# Patient Record
Sex: Male | Born: 1955
Health system: Southern US, Community
[De-identification: ages and names within clinical notes are randomized; demographics above are authoritative.]

## PROBLEM LIST (undated history)

## (undated) ENCOUNTER — Emergency Department (HOSPITAL_COMMUNITY)

## (undated) DIAGNOSIS — I428 Other cardiomyopathies: Secondary | ICD-10-CM

## (undated) DIAGNOSIS — Z7901 Long term (current) use of anticoagulants: Secondary | ICD-10-CM

## (undated) DIAGNOSIS — F1290 Cannabis use, unspecified, uncomplicated: Secondary | ICD-10-CM

## (undated) DIAGNOSIS — Z720 Tobacco use: Secondary | ICD-10-CM

## (undated) DIAGNOSIS — E785 Hyperlipidemia, unspecified: Secondary | ICD-10-CM

## (undated) DIAGNOSIS — I10 Essential (primary) hypertension: Secondary | ICD-10-CM

## (undated) DIAGNOSIS — Z79899 Other long term (current) drug therapy: Secondary | ICD-10-CM

## (undated) DIAGNOSIS — K7689 Other specified diseases of liver: Secondary | ICD-10-CM

## (undated) DIAGNOSIS — K089 Disorder of teeth and supporting structures, unspecified: Secondary | ICD-10-CM

## (undated) DIAGNOSIS — E042 Nontoxic multinodular goiter: Secondary | ICD-10-CM

## (undated) DIAGNOSIS — I700 Atherosclerosis of aorta: Secondary | ICD-10-CM

## (undated) DIAGNOSIS — I739 Peripheral vascular disease, unspecified: Secondary | ICD-10-CM

## (undated) DIAGNOSIS — I5020 Unspecified systolic (congestive) heart failure: Secondary | ICD-10-CM

## (undated) DIAGNOSIS — N400 Enlarged prostate without lower urinary tract symptoms: Secondary | ICD-10-CM

## (undated) DIAGNOSIS — I4891 Unspecified atrial fibrillation: Secondary | ICD-10-CM

## (undated) DIAGNOSIS — E079 Disorder of thyroid, unspecified: Secondary | ICD-10-CM

## (undated) DIAGNOSIS — I509 Heart failure, unspecified: Secondary | ICD-10-CM

## (undated) DIAGNOSIS — R011 Cardiac murmur, unspecified: Secondary | ICD-10-CM

## (undated) DIAGNOSIS — D1803 Hemangioma of intra-abdominal structures: Secondary | ICD-10-CM

## (undated) DIAGNOSIS — D125 Benign neoplasm of sigmoid colon: Secondary | ICD-10-CM

## (undated) DIAGNOSIS — D7589 Other specified diseases of blood and blood-forming organs: Secondary | ICD-10-CM

## (undated) DIAGNOSIS — E039 Hypothyroidism, unspecified: Secondary | ICD-10-CM

## (undated) DIAGNOSIS — M1990 Unspecified osteoarthritis, unspecified site: Secondary | ICD-10-CM

## (undated) DIAGNOSIS — R972 Elevated prostate specific antigen [PSA]: Secondary | ICD-10-CM

## (undated) DIAGNOSIS — Z91148 Patient's other noncompliance with medication regimen for other reason: Secondary | ICD-10-CM

## (undated) HISTORY — DX: Other specified diseases of blood and blood-forming organs: D75.89

## (undated) HISTORY — DX: Hyperlipidemia, unspecified: E78.5

## (undated) HISTORY — DX: Tobacco use: Z72.0

---

## 2013-11-12 ENCOUNTER — Ambulatory Visit: Payer: Self-pay | Admitting: Family Medicine

## 2014-07-26 DIAGNOSIS — D7589 Other specified diseases of blood and blood-forming organs: Secondary | ICD-10-CM | POA: Diagnosis not present

## 2014-07-26 DIAGNOSIS — R079 Chest pain, unspecified: Secondary | ICD-10-CM | POA: Diagnosis not present

## 2014-07-26 DIAGNOSIS — E785 Hyperlipidemia, unspecified: Secondary | ICD-10-CM | POA: Diagnosis not present

## 2014-07-26 DIAGNOSIS — F17290 Nicotine dependence, other tobacco product, uncomplicated: Secondary | ICD-10-CM | POA: Diagnosis not present

## 2014-08-07 ENCOUNTER — Ambulatory Visit: Payer: Self-pay | Admitting: Cardiology

## 2014-08-29 ENCOUNTER — Ambulatory Visit (INDEPENDENT_AMBULATORY_CARE_PROVIDER_SITE_OTHER): Payer: Commercial Managed Care - HMO | Admitting: Cardiovascular Disease

## 2014-08-29 ENCOUNTER — Encounter: Payer: Self-pay | Admitting: Cardiovascular Disease

## 2014-08-29 VITALS — BP 110/78 | HR 69 | Ht 73.0 in | Wt 167.0 lb

## 2014-08-29 DIAGNOSIS — F17200 Nicotine dependence, unspecified, uncomplicated: Secondary | ICD-10-CM

## 2014-08-29 DIAGNOSIS — Z720 Tobacco use: Secondary | ICD-10-CM

## 2014-08-29 DIAGNOSIS — K089 Disorder of teeth and supporting structures, unspecified: Secondary | ICD-10-CM

## 2014-08-29 DIAGNOSIS — E785 Hyperlipidemia, unspecified: Secondary | ICD-10-CM | POA: Insufficient documentation

## 2014-08-29 DIAGNOSIS — R079 Chest pain, unspecified: Secondary | ICD-10-CM

## 2014-08-29 NOTE — Assessment & Plan Note (Signed)
Atypical chest pain. He does have a good exercise tolerance with no symptoms He is concerned about this discomfort that comes and goes. We will order a routine treadmill study at his convenience

## 2014-08-29 NOTE — Progress Notes (Signed)
   Patient ID: Tyler Cohen, male    DOB: 1955/12/13, 59 y.o.   MRN: [id number]  HPI Comments: Tyler Cohen is a pleasant 59 year old gentleman with long smoking history for 40 years who started at age 19, who presents by referral for chest pain.  He reports that he is very active, uses his bike 3 days per week, goes a proximally 5-10 miles from [place] to [place]. He does not have a car to drive. He's been doing this for approximately one year and typically has no chest pain with exertion.  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 Denies having any chest pain when he mows.   He reports a vague sense of chest discomfort now and then, not associated with exertion, more often when he has stress Symptoms have been going on for 2-3 months. Not getting worse  Sometimes on the right, sometimes middle, typically diffuse in his chest . Not very intense .  EKG on today's visit shows normal sinus rhythm with rate 69 bpm, nonspecific ST abnormality consistent with early repolarization    No Known Allergies  No current outpatient prescriptions on file prior to visit.   No current facility-administered medications on file prior to visit.    Past Medical History  Diagnosis Date  . Hyperlipidemia   . Tobacco abuse     History reviewed. No pertinent past surgical history.  Social History  reports that he has been smoking Cigarettes.  He has a 7.5 pack-year smoking history. He does not have any smokeless tobacco history on file. He reports that he does not drink alcohol or use illicit drugs.  Family History family history includes Hypertension in his brother, father, and mother. Father with sickle cell trait, emphysema, smoker Mother with kidney disease, lost both legs, diabetic on dialysis Both parents died in their early 56s  Review of Systems  Constitutional: Negative.   Respiratory: Negative.   Cardiovascular: Positive for chest pain.   Gastrointestinal: Negative.   Musculoskeletal: Negative.   Skin: Negative.   Neurological: Negative.   Hematological: Negative.   Psychiatric/Behavioral: Negative.   All other systems reviewed and are negative.   BP 110/78 mmHg  Pulse 69  Ht 6\' 1"  (1.854 m)  Wt 167 lb (75.751 kg)  BMI 22.04 kg/m2  Physical Exam  Constitutional: He is oriented to person, place, and time. He appears well-developed and well-nourished.  HENT:  Head: Normocephalic.  Nose: Nose normal.  Mouth/Throat: Oropharynx is clear and moist.  Eyes: Conjunctivae are normal. Pupils are equal, round, and reactive to light.  Neck: Normal range of motion. Neck supple. No JVD present.  Cardiovascular: Normal rate, regular rhythm, normal heart sounds and intact distal pulses.  Exam reveals no gallop and no friction rub.   No murmur heard. Pulmonary/Chest: Effort normal and breath sounds normal. No respiratory distress. He has no wheezes. He has no rales. He exhibits no tenderness.  Abdominal: Soft. Bowel sounds are normal. He exhibits no distension. There is no tenderness.  Musculoskeletal: Normal range of motion. He exhibits no edema or tenderness.  Lymphadenopathy:    He has no cervical adenopathy.  Neurological: He is alert and oriented to person, place, and time. Coordination normal.  Skin: Skin is warm and dry. No rash noted. No erythema.  Psychiatric: He has a normal mood and affect. His behavior is normal. Judgment and thought content normal.

## 2014-08-29 NOTE — Assessment & Plan Note (Signed)
His dental disease does place him at higher risk of coronary artery disease.

## 2014-08-29 NOTE — Patient Instructions (Addendum)
You are doing well. No medication changes were made.  We will schedule a treadmill stress test for chest pain  Please call us if you have new issues that need to be addressed before your next appt.    Exercise Stress Electrocardiogram An exercise stress electrocardiogram is a test that is done to evaluate the blood supply to your heart. This test may also be called exercise stress electrocardiography. The test is done while you are walking on a treadmill. The goal of this test is to raise your heart rate. This test is done to find areas of poor blood flow to the heart by determining the extent of coronary artery disease (CAD).   CAD is defined as narrowing in one or more heart (coronary) arteries of more than 70%. If you have an abnormal test result, this may mean that you are not getting adequate blood flow to your heart during exercise. Additional testing may be needed to understand why your test was abnormal. LET [facility] CARE PROVIDER KNOW ABOUT:   Any allergies you have.  All medicines you are taking, including vitamins, herbs, eye drops, creams, and over-the-counter medicines.  Previous problems you or members of your family have had with the use of anesthetics.  Any blood disorders you have.  Previous surgeries you have had.  Medical conditions you have.  Possibility of pregnancy, if this applies. RISKS AND COMPLICATIONS Generally, this is a safe procedure. However, as with any procedure, complications can occur. Possible complications can include:  Pain or pressure in the following areas:  Chest.  Jaw or neck.  Between your shoulder blades.  Radiating down your left arm.  Dizziness or light-headedness.  Shortness of breath.  Increased or irregular heartbeats.  Nausea or vomiting.  Heart attack (rare). BEFORE THE PROCEDURE  Avoid all forms of caffeine 24 hours before your test or as directed by your health care provider. This includes coffee, tea (even  decaffeinated tea), caffeinated sodas, chocolate, cocoa, and certain pain medicines.  Follow your health care provider's instructions regarding eating and drinking before the test.  Take your medicines as directed at regular times with water unless instructed otherwise. Exceptions may include:  If you have diabetes, ask how you are to take your insulin or pills. It is common to adjust insulin dosing the morning of the test.  If you are taking beta-blocker medicines, it is important to talk to your health care provider about these medicines well before the date of your test. Taking beta-blocker medicines may interfere with the test. In some cases, these medicines need to be changed or stopped 24 hours or more before the test.  If you wear a nitroglycerin patch, it may need to be removed prior to the test. Ask your health care provider if the patch should be removed before the test.  If you use an inhaler for any breathing condition, bring it with you to the test.  If you are an outpatient, bring a snack so you can eat right after the stress phase of the test.  Do not smoke for 4 hours prior to the test or as directed by your health care provider.  Do not apply lotions, powders, creams, or oils on your chest prior to the test.  Wear loose-fitting clothes and comfortable shoes for the test. This test involves walking on a treadmill. PROCEDURE  Multiple patches (electrodes) will be put on your chest. If needed, small areas of your chest may have to be shaved to get better  contact with the electrodes. Once the electrodes are attached to your body, multiple wires will be attached to the electrodes and your heart rate will be monitored.  Your heart will be monitored both at rest and while exercising.  You will walk on a treadmill. The treadmill will be started at a slow pace. The treadmill speed and incline will gradually be increased to raise your heart rate. AFTER THE PROCEDURE  Your heart  rate and blood pressure will be monitored after the test.  You may return to your normal schedule including diet, activities, and medicines, unless your health care provider tells you otherwise. Document Released: 03/19/2000 Document Revised: 03/27/2013 Document Reviewed: 11/27/2012 [facility] Patient Information 2015 [place], [place]. This information is not intended to replace advice given to you by your health care provider. Make sure you discuss any questions you have with your health care provider.

## 2014-08-29 NOTE — Assessment & Plan Note (Signed)
We have encouraged him to continue to work on weaning his cigarettes and smoking cessation. He will continue to work on this and does not want any assistance with chantix.  

## 2014-08-29 NOTE — Assessment & Plan Note (Signed)
Recommended he stay on his Lipitor

## 2014-09-19 ENCOUNTER — Ambulatory Visit (INDEPENDENT_AMBULATORY_CARE_PROVIDER_SITE_OTHER): Payer: Commercial Managed Care - HMO | Admitting: Cardiovascular Disease

## 2014-09-19 DIAGNOSIS — R079 Chest pain, unspecified: Secondary | ICD-10-CM

## 2014-09-19 LAB — EXERCISE TOLERANCE TEST
CSEPHR: 84 %
CSEPPHR: 137 {beats}/min
Estimated workload: 10.1 METS
Exercise duration (min): 9 min
Exercise duration (sec): 0 s
Rest HR: 67 {beats}/min

## 2015-02-18 ENCOUNTER — Other Ambulatory Visit: Payer: Self-pay | Admitting: Family Medicine

## 2015-04-06 DIAGNOSIS — L409 Psoriasis, unspecified: Secondary | ICD-10-CM

## 2015-04-06 HISTORY — DX: Psoriasis, unspecified: L40.9

## 2015-07-08 ENCOUNTER — Other Ambulatory Visit: Payer: Self-pay | Admitting: Family Medicine

## 2015-07-08 ENCOUNTER — Encounter: Payer: Self-pay | Admitting: Family Medicine

## 2015-07-08 NOTE — Telephone Encounter (Signed)
apt 

## 2015-07-14 NOTE — Telephone Encounter (Signed)
Letter sent.

## 2015-08-11 DIAGNOSIS — D7589 Other specified diseases of blood and blood-forming organs: Secondary | ICD-10-CM | POA: Insufficient documentation

## 2015-08-11 DIAGNOSIS — Z720 Tobacco use: Secondary | ICD-10-CM | POA: Insufficient documentation

## 2015-08-20 ENCOUNTER — Ambulatory Visit: Payer: Self-pay | Admitting: Unknown Physician Specialty

## 2015-09-03 ENCOUNTER — Encounter: Payer: Self-pay | Admitting: Unknown Physician Specialty

## 2015-09-03 ENCOUNTER — Ambulatory Visit (INDEPENDENT_AMBULATORY_CARE_PROVIDER_SITE_OTHER): Payer: Commercial Managed Care - HMO | Admitting: Unknown Physician Specialty

## 2015-09-03 VITALS — BP 109/73 | HR 71 | Temp 97.3°F | Ht 71.7 in | Wt 166.2 lb

## 2015-09-03 DIAGNOSIS — Z720 Tobacco use: Secondary | ICD-10-CM | POA: Diagnosis not present

## 2015-09-03 DIAGNOSIS — Z0000 Encounter for general adult medical examination without abnormal findings: Secondary | ICD-10-CM

## 2015-09-03 DIAGNOSIS — L409 Psoriasis, unspecified: Secondary | ICD-10-CM

## 2015-09-03 DIAGNOSIS — F17200 Nicotine dependence, unspecified, uncomplicated: Secondary | ICD-10-CM

## 2015-09-03 DIAGNOSIS — E785 Hyperlipidemia, unspecified: Secondary | ICD-10-CM | POA: Diagnosis not present

## 2015-09-03 MED ORDER — CLOTRIMAZOLE-BETAMETHASONE 1-0.05 % EX CREA
1.0000 | TOPICAL_CREAM | Freq: Two times a day (BID) | CUTANEOUS | Status: DC
Start: 2015-09-03 — End: 2016-09-09

## 2015-09-03 NOTE — Assessment & Plan Note (Signed)
Rx for Lotrisone 

## 2015-09-03 NOTE — Progress Notes (Signed)
   BP 109/73 mmHg  Pulse 71  Temp(Src) 97.3 F (36.3 C)  Ht 5' 11.7" (1.821 m)  Wt 166 lb 3.2 oz (75.388 kg)  BMI 22.73 kg/m2  SpO2 97%   Subjective:    Patient ID: Tyler Cohen, male    DOB: 1956/02/24, 60 y.o.   MRN: [id number]  HPI: Tyler Cohen is a 60 y.o. male  Chief Complaint  Patient presents with  . Hyperlipidemia  . Labs Only    HIV and Hep C order entered  . Rash    pt states he has a rash on his left arm and neck that came up about a month ago. States psoriasis runs in his family.     Hyperlipidemia Using medications without problems No Muscle aches  Diet compliance: Watches what he eats.   Exercise: rides a bicycle  Rash Rash on left arm and behind his neck for 30 days.  States rash itches when it gets hot.  No fever.  No problems with bowel or bladder  Tobacco Cutting back and trying to quit.    Relevant past medical, surgical, family and social history reviewed and updated as indicated. Interim medical history since our last visit reviewed. Allergies and medications reviewed and updated.  Review of Systems  Per HPI unless specifically indicated above     Objective:    BP 109/73 mmHg  Pulse 71  Temp(Src) 97.3 F (36.3 C)  Ht 5' 11.7" (1.821 m)  Wt 166 lb 3.2 oz (75.388 kg)  BMI 22.73 kg/m2  SpO2 97%  Wt Readings from Last 3 Encounters:  09/03/15 166 lb 3.2 oz (75.388 kg)  07/26/14 173 lb (78.472 kg)  08/29/14 167 lb (75.751 kg)    Physical Exam  Constitutional: He is oriented to person, place, and time. He appears well-developed and well-nourished. No distress.  HENT:  Head: Normocephalic and atraumatic.  Eyes: Conjunctivae and lids are normal. Right eye exhibits no discharge. Left eye exhibits no discharge. No scleral icterus.  Neck: Normal range of motion. Neck supple. No JVD present. Carotid bruit is not present.  Cardiovascular: Normal rate, regular rhythm and normal heart sounds.   Pulmonary/Chest: Effort normal and breath  sounds normal. No respiratory distress.  Abdominal: Normal appearance. There is no splenomegaly or hepatomegaly.  Musculoskeletal: Normal range of motion.  Neurological: He is alert and oriented to person, place, and time.  Skin: Skin is warm, dry and intact. Rash noted. No pallor.  Papular rash left arm and behind neck  Psychiatric: He has a normal mood and affect. His behavior is normal. Judgment and thought content normal.      Assessment & Plan:   Problem List Items Addressed This Visit      Unprioritized   Hyperlipidemia    LDL is 89.  Continue present meds.        Relevant Orders   Lipid Panel w/o Chol/HDL Ratio   Comprehensive metabolic panel   Psoriasis    Rx for Lotrisone      Smoker    Encouraged to quit       Other Visit Diagnoses    Health care maintenance    -  Primary    Relevant Orders    Hepatitis C antibody    HIV antibody        Follow up plan: Return for physical.

## 2015-09-03 NOTE — Assessment & Plan Note (Signed)
Encouraged to quit. 

## 2015-09-03 NOTE — Assessment & Plan Note (Addendum)
LDL is 89.  Continue present meds.

## 2015-09-04 LAB — COMPREHENSIVE METABOLIC PANEL
A/G RATIO: 1.5 (ref 1.2–2.2)
ALT: 17 IU/L (ref 0–44)
AST: 19 IU/L (ref 0–40)
Albumin: 4 g/dL (ref 3.5–5.5)
Alkaline Phosphatase: 62 IU/L (ref 39–117)
BILIRUBIN TOTAL: 0.4 mg/dL (ref 0.0–1.2)
BUN/Creatinine Ratio: 15 (ref 9–20)
BUN: 15 mg/dL (ref 6–24)
CHLORIDE: 102 mmol/L (ref 96–106)
CO2: 23 mmol/L (ref 18–29)
Calcium: 9.4 mg/dL (ref 8.7–10.2)
Creatinine, Ser: 0.99 mg/dL (ref 0.76–1.27)
GFR, EST AFRICAN AMERICAN: 96 mL/min/{1.73_m2} (ref >59)
GFR, EST NON AFRICAN AMERICAN: 83 mL/min/{1.73_m2} (ref >59)
GLOBULIN, TOTAL: 2.7 g/dL (ref 1.5–4.5)
GLUCOSE: 83 mg/dL (ref 65–99)
POTASSIUM: 4.5 mmol/L (ref 3.5–5.2)
SODIUM: 138 mmol/L (ref 134–144)
TOTAL PROTEIN: 6.7 g/dL (ref 6.0–8.5)

## 2015-09-04 LAB — LIPID PANEL W/O CHOL/HDL RATIO
Cholesterol, Total: 159 mg/dL (ref 100–199)
HDL: 54 mg/dL (ref >39)
LDL CALC: 95 mg/dL (ref 0–99)
Triglycerides: 51 mg/dL (ref 0–149)
VLDL Cholesterol Cal: 10 mg/dL (ref 5–40)

## 2015-09-04 LAB — HEPATITIS C ANTIBODY

## 2015-09-04 LAB — HIV ANTIBODY (ROUTINE TESTING W REFLEX): HIV Screen 4th Generation wRfx: NONREACTIVE

## 2015-10-15 ENCOUNTER — Encounter: Payer: Self-pay | Admitting: Unknown Physician Specialty

## 2015-10-15 ENCOUNTER — Ambulatory Visit (INDEPENDENT_AMBULATORY_CARE_PROVIDER_SITE_OTHER): Payer: Commercial Managed Care - HMO | Admitting: Unknown Physician Specialty

## 2015-10-15 VITALS — BP 104/70 | HR 69 | Temp 98.0°F | Ht 72.6 in | Wt 163.2 lb

## 2015-10-15 DIAGNOSIS — Z0000 Encounter for general adult medical examination without abnormal findings: Secondary | ICD-10-CM | POA: Diagnosis not present

## 2015-10-15 DIAGNOSIS — Z125 Encounter for screening for malignant neoplasm of prostate: Secondary | ICD-10-CM | POA: Diagnosis not present
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
## 2015-10-24 ENCOUNTER — Other Ambulatory Visit: Payer: Self-pay

## 2015-10-24 ENCOUNTER — Telehealth: Payer: Self-pay

## 2015-10-24 NOTE — Telephone Encounter (Signed)
Gastroenterology Pre-Procedure Review  Request Date: 12/01/2015  Requesting Physician: Dr. Julian Hy  PATIENT REVIEW QUESTIONS: The patient responded to the following health history questions as indicated:    1. Are you having any GI issues? no 2. Do you have a personal history of Polyps? no 3. Do you have a family history of Colon Cancer or Polyps? no 4. Diabetes Mellitus? no 5. Joint replacements in the past 12 months?no 6. Major health problems in the past 3 months?no 7. Any artificial heart valves, MVP, or defibrillator?no    MEDICATIONS & ALLERGIES:    Patient reports the following regarding taking any anticoagulation/antiplatelet therapy:   Plavix, Coumadin, Eliquis, Xarelto, Lovenox, Pradaxa, Brilinta, or Effient? no Aspirin? yes (heart health )  Patient confirms/reports the following medications:  Current Outpatient Prescriptions  Medication Sig Dispense Refill  . aspirin 81 MG tablet Take 81 mg by mouth daily.    Marland Kitchen atorvastatin (LIPITOR) 20 MG tablet TAKE 1 TABLET BY MOUTH AT BEDTIME 30 tablet 1  . b complex vitamins tablet Take 1 tablet by mouth daily.    . clotrimazole-betamethasone (LOTRISONE) cream Apply 1 application topically 2 (two) times daily. 30 g 1   No current facility-administered medications for this visit.    Patient confirms/reports the following allergies:  No Known Allergies  No orders of the defined types were placed in this encounter.    AUTHORIZATION INFORMATION Primary Insurance: 1D#: Group #:  Secondary Insurance: 1D#: Group #:  SCHEDULE INFORMATION: Date: 12/01/2015  Time: Location: [facility]

## 2015-10-27 ENCOUNTER — Other Ambulatory Visit: Payer: Self-pay | Admitting: Family Medicine

## 2015-11-03 NOTE — Telephone Encounter (Signed)
Per [employer], cpt code [phone] does not require Pre-Authorization. If you have any questions, please reply or contact our call center at [phone].

## 2015-11-28 NOTE — Discharge Instructions (Signed)

## 2015-12-01 ENCOUNTER — Ambulatory Visit: Payer: Commercial Managed Care - HMO | Admitting: Anesthesiology

## 2015-12-01 ENCOUNTER — Encounter
Admission: RE | Disposition: A | Discharge status: Home / Self Care | Payer: Self-pay | Source: Ambulatory Visit | Attending: Gastroenterology

## 2015-12-01 ENCOUNTER — Ambulatory Visit
Admission: RE | Admit: 2015-12-01 | Discharge: 2015-12-01 | Disposition: A | Discharge status: Home / Self Care | Payer: Commercial Managed Care - HMO | Source: Ambulatory Visit | Attending: Gastroenterology | Admitting: Gastroenterology

## 2015-12-01 DIAGNOSIS — R011 Cardiac murmur, unspecified: Secondary | ICD-10-CM | POA: Insufficient documentation

## 2015-12-01 DIAGNOSIS — Z79899 Other long term (current) drug therapy: Secondary | ICD-10-CM | POA: Insufficient documentation

## 2015-12-01 DIAGNOSIS — Z825 Family history of asthma and other chronic lower respiratory diseases: Secondary | ICD-10-CM | POA: Diagnosis not present

## 2015-12-01 DIAGNOSIS — F17210 Nicotine dependence, cigarettes, uncomplicated: Secondary | ICD-10-CM | POA: Insufficient documentation

## 2015-12-01 DIAGNOSIS — D7589 Other specified diseases of blood and blood-forming organs: Secondary | ICD-10-CM | POA: Diagnosis not present

## 2015-12-01 DIAGNOSIS — Z841 Family history of disorders of kidney and ureter: Secondary | ICD-10-CM | POA: Insufficient documentation

## 2015-12-01 DIAGNOSIS — Z832 Family history of diseases of the blood and blood-forming organs and certain disorders involving the immune mechanism: Secondary | ICD-10-CM | POA: Diagnosis not present

## 2015-12-01 DIAGNOSIS — K635 Polyp of colon: Secondary | ICD-10-CM | POA: Diagnosis not present

## 2015-12-01 DIAGNOSIS — Z1211 Encounter for screening for malignant neoplasm of colon: Secondary | ICD-10-CM

## 2015-12-01 DIAGNOSIS — K641 Second degree hemorrhoids: Secondary | ICD-10-CM | POA: Insufficient documentation

## 2015-12-01 DIAGNOSIS — E785 Hyperlipidemia, unspecified: Secondary | ICD-10-CM | POA: Diagnosis not present

## 2015-12-01 DIAGNOSIS — D125 Benign neoplasm of sigmoid colon: Secondary | ICD-10-CM

## 2015-12-01 DIAGNOSIS — Z833 Family history of diabetes mellitus: Secondary | ICD-10-CM | POA: Diagnosis not present

## 2015-12-01 DIAGNOSIS — E079 Disorder of thyroid, unspecified: Secondary | ICD-10-CM | POA: Insufficient documentation

## 2015-12-01 DIAGNOSIS — M1990 Unspecified osteoarthritis, unspecified site: Secondary | ICD-10-CM | POA: Insufficient documentation

## 2015-12-01 DIAGNOSIS — E7800 Pure hypercholesterolemia, unspecified: Secondary | ICD-10-CM | POA: Insufficient documentation

## 2015-12-01 DIAGNOSIS — Z7982 Long term (current) use of aspirin: Secondary | ICD-10-CM | POA: Insufficient documentation

## 2015-12-01 DIAGNOSIS — Z8249 Family history of ischemic heart disease and other diseases of the circulatory system: Secondary | ICD-10-CM | POA: Diagnosis not present

## 2015-12-01 HISTORY — PX: COLONOSCOPY WITH PROPOFOL: SHX5780

## 2015-12-01 HISTORY — DX: Unspecified osteoarthritis, unspecified site: M19.90

## 2015-12-01 HISTORY — PX: POLYPECTOMY: SHX5525

## 2015-12-01 HISTORY — DX: Disorder of thyroid, unspecified: E07.9

## 2015-12-01 HISTORY — DX: Cardiac murmur, unspecified: R01.1

## 2015-12-01 SURGERY — COLONOSCOPY WITH PROPOFOL
Anesthesia: Monitor Anesthesia Care | Wound class: Contaminated

## 2015-12-01 MED ORDER — LIDOCAINE HCL (CARDIAC) 20 MG/ML IV SOLN
INTRAVENOUS | Status: DC | PRN
  Administered 2015-12-01: 50 mg via INTRAVENOUS

## 2015-12-01 MED ORDER — PROPOFOL 10 MG/ML IV BOLUS
INTRAVENOUS | Status: DC | PRN
  Administered 2015-12-01: 30 mg via INTRAVENOUS
  Administered 2015-12-01: 10 mg via INTRAVENOUS
  Administered 2015-12-01 (×3): 20 mg via INTRAVENOUS
  Administered 2015-12-01: 30 mg via INTRAVENOUS
  Administered 2015-12-01: 70 mg via INTRAVENOUS

## 2015-12-01 MED ORDER — LACTATED RINGERS IV SOLN
INTRAVENOUS | Status: DC | PRN
  Administered 2015-12-01: 09:00:00 via INTRAVENOUS

## 2015-12-01 MED ORDER — STERILE WATER FOR IRRIGATION IR SOLN
Status: DC | PRN
  Administered 2015-12-01: 09:00:00

## 2015-12-01 SURGICAL SUPPLY — 23 items
CANISTER SUCT 1200ML W/VALVE (MISCELLANEOUS) ×3 IMPLANT
CLIP HMST 235XBRD CATH ROT (MISCELLANEOUS) IMPLANT
CLIP RESOLUTION 360 11X235 (MISCELLANEOUS)
FCP ESCP3.2XJMB 240X2.8X (MISCELLANEOUS)
FORCEPS BIOP RAD 4 LRG CAP 4 (CUTTING FORCEPS) IMPLANT
FORCEPS BIOP RJ4 240 W/NDL (MISCELLANEOUS)
FORCEPS ESCP3.2XJMB 240X2.8X (MISCELLANEOUS) IMPLANT
GOWN CVR UNV OPN BCK APRN NK (MISCELLANEOUS) ×4 IMPLANT
GOWN ISOL THUMB LOOP REG UNIV (MISCELLANEOUS) ×2
INJECTOR VARIJECT VIN23 (MISCELLANEOUS) IMPLANT
KIT DEFENDO VALVE AND CONN (KITS) IMPLANT
KIT ENDO PROCEDURE OLY (KITS) ×3 IMPLANT
MARKER SPOT ENDO TATTOO 5ML (MISCELLANEOUS) IMPLANT
PAD GROUND ADULT SPLIT (MISCELLANEOUS) IMPLANT
PROBE APC STR FIRE (PROBE) IMPLANT
RETRIEVER NET ROTH 2.5X230 LF (MISCELLANEOUS) ×3 IMPLANT
SNARE SHORT THROW 13M SML OVAL (MISCELLANEOUS) ×3 IMPLANT
SNARE SHORT THROW 30M LRG OVAL (MISCELLANEOUS) IMPLANT
SNARE SNG USE RND 15MM (INSTRUMENTS) IMPLANT
SPOT EX ENDOSCOPIC TATTOO (MISCELLANEOUS)
TRAP ETRAP POLY (MISCELLANEOUS) ×3 IMPLANT
VARIJECT INJECTOR VIN23 (MISCELLANEOUS)
WATER STERILE IRR 250ML POUR (IV SOLUTION) ×3 IMPLANT

## 2015-12-01 NOTE — Transfer of Care (Signed)
Immediate Anesthesia Transfer of Care Note  Patient: URIYAH ETUE  Procedure(s) Performed: Procedure(s): COLONOSCOPY WITH PROPOFOL (N/A) POLYPECTOMY  Patient Location: PACU  Anesthesia Type: MAC  Level of Consciousness: awake, alert  and patient cooperative  Airway and Oxygen Therapy: Patient Spontanous Breathing and Patient connected to supplemental oxygen  Post-op Assessment: Post-op Vital signs reviewed, Patient's Cardiovascular Status Stable, Respiratory Function Stable, Patent Airway and No signs of Nausea or vomiting  Post-op Vital Signs: Reviewed and stable  Complications: No apparent anesthesia complications

## 2015-12-01 NOTE — Op Note (Signed)
[facility] Gastroenterology Patient Name: Tyler Cohen Procedure Date: 12/01/2015 8:44 AM MRN: [id number] Account #: [id number] Date of Birth: 02-09-1956 Admit Type: Outpatient Age: 60 [facility]OM 01 Gender: Male Note Status: Finalized Procedure:            Colonoscopy Indications:          Screening for colorectal malignant neoplasm Providers:            Lucilla Lame MD, MD Referring MD:         Kathrine Haddock (Referring MD) Medicines:            Propofol per Anesthesia Complications:        No immediate complications. Procedure:            Pre-Anesthesia Assessment:                       - Prior to the procedure, a History and Physical was                        performed, and patient medications and allergies were                        reviewed. The patient's tolerance of previous                        anesthesia was also reviewed. The risks and benefits of                        the procedure and the sedation options and risks were                        discussed with the patient. All questions were                        answered, and informed consent was obtained. Prior                        Anticoagulants: The patient has taken no previous                        anticoagulant or antiplatelet agents. ASA Grade                        Assessment: II - A patient with mild systemic disease.                        After reviewing the risks and benefits, the patient was                        deemed in satisfactory condition to undergo the                        procedure.                       After obtaining informed consent, the colonoscope was                        passed under direct vision. Throughout the procedure,  the patient's blood pressure, pulse, and oxygen                        saturations were monitored continuously. The Olympus CF                        H180AL colonoscope (S#: [id number]) was introduced through                     the anus and advanced to the the cecum, identified by                        appendiceal orifice and ileocecal valve. The                        colonoscopy was performed without difficulty. The                        patient tolerated the procedure well. The quality of                        the bowel preparation was fair. Findings:      The perianal and digital rectal examinations were normal.      Four sessile polyps were found in the sigmoid colon. The polyps were 5       to 7 mm in size. These polyps were removed with a cold snare. Resection       and retrieval were complete.      Non-bleeding internal hemorrhoids were found during retroflexion. The       hemorrhoids were Grade II (internal hemorrhoids that prolapse but reduce       spontaneously). Impression:           - Preparation of the colon was fair.                       - Four 5 to 7 mm polyps in the sigmoid colon, removed                        with a cold snare. Resected and retrieved.                       - Non-bleeding internal hemorrhoids. Recommendation:       - Await pathology results.                       - Repeat colonoscopy in 5 years if polyp adenoma and 10                        years if hyperplastic Procedure Code(s):    --- Professional ---                       [phone], Colonoscopy, flexible; with removal of tumor(s),                        polyp(s), or other lesion(s) by snare technique Diagnosis Code(s):    --- Professional ---                       Z12.11, Encounter for screening for malignant neoplasm  of colon                       D12.5, Benign neoplasm of sigmoid colon CPT copyright 2016 [employer] Medical Association. All rights reserved. The codes documented in this report are preliminary and upon coder review may  be revised to meet current compliance requirements. Lucilla Lame MD, MD 12/01/2015 9:06:22 AM This report has been signed electronically. Number of  Addenda: 0 Note Initiated On: 12/01/2015 8:44 AM Scope Withdrawal Time: 0 hours 7 minutes 42 seconds  Total Procedure Duration: 0 hours 10 minutes 42 seconds       [facility]

## 2015-12-01 NOTE — Anesthesia Procedure Notes (Signed)
Procedure Name: MAC Performed by: Deaunte Dente Pre-anesthesia Checklist: Patient identified, Emergency Drugs available, Suction available, Timeout performed and Patient being monitored Patient Re-evaluated:Patient Re-evaluated prior to inductionOxygen Delivery Method: Nasal cannula Placement Confirmation: positive ETCO2       

## 2015-12-01 NOTE — Anesthesia Postprocedure Evaluation (Signed)
Anesthesia Post Note  Patient: Tyler Cohen  Procedure(s) Performed: Procedure(s) (LRB): COLONOSCOPY WITH PROPOFOL (N/A) POLYPECTOMY  Anesthesia Post Evaluation  Virl Axe,  Jarrell Armond D

## 2015-12-01 NOTE — Anesthesia Preprocedure Evaluation (Signed)
Anesthesia Evaluation  Patient identified by MRN, date of birth, ID band Patient awake    Reviewed: Allergy & Precautions, H&P , NPO status , Patient's Chart, lab work & pertinent test results  History of Anesthesia Complications Negative for: history of anesthetic complications  Airway Mallampati: II  TM Distance: >3 FB Neck ROM: full    Dental  (+) Edentulous Upper, Poor Dentition   Pulmonary Current Smoker,    Pulmonary exam normal        Cardiovascular Normal cardiovascular exam     Neuro/Psych negative neurological ROS     GI/Hepatic negative GI ROS, Neg liver ROS,   Endo/Other  Elevated cholesterol  Renal/GU   negative genitourinary   Musculoskeletal   Abdominal   Peds  Hematology negative hematology ROS (+)   Anesthesia Other Findings   Reproductive/Obstetrics                             Anesthesia Physical Anesthesia Plan  ASA: II  Anesthesia Plan: MAC   Post-op Pain Management:    Induction:   Airway Management Planned:   Additional Equipment:   Intra-op Plan:   Post-operative Plan:   Informed Consent: I have reviewed the patients History and Physical, chart, labs and discussed the procedure including the risks, benefits and alternatives for the proposed anesthesia with the patient or authorized representative who has indicated his/her understanding and acceptance.     Plan Discussed with:   Anesthesia Plan Comments:         Anesthesia Quick Evaluation

## 2015-12-01 NOTE — H&P (Signed)
  Lucilla Lame, MD [facility] [address] [place], [place] [address] Phone: [phone] Fax : [phone]  Primary Care Physician:  Kathrine Haddock, NP Primary Gastroenterologist:  Dr. Allen Norris  Pre-Procedure History & Physical: HPI:  Tyler Cohen is a 60 y.o. male is here for a screening colonoscopy.   Past Medical History:  Diagnosis Date  . Arthritis   . Heart murmur   . Hyperlipidemia   . Macrocytosis   . Thyroid disease   . Tobacco abuse     History reviewed. No pertinent surgical history.  Prior to Admission medications   Medication Sig Start Date End Date Taking? Authorizing Provider  aspirin 81 MG tablet Take 81 mg by mouth daily.   Yes Historical Provider, MD  atorvastatin (LIPITOR) 20 MG tablet TAKE 1 TABLET BY MOUTH AT BEDTIME 10/27/15  Yes Volney American, PA-C  b complex vitamins tablet Take 1 tablet by mouth daily.   Yes Historical Provider, MD  clotrimazole-betamethasone (LOTRISONE) cream Apply 1 application topically 2 (two) times daily. 09/03/15  Yes Kathrine Haddock, NP    Allergies as of 10/24/2015  . (No Known Allergies)    Family History  Problem Relation Age of Onset  . Hypertension Mother   . Diabetes Mother     lost both legs  . Kidney disease Mother   . Hypertension Father   . Emphysema Father   . Sickle cell trait Father   . Hypertension Brother   . Hyperlipidemia Brother   . Sickle cell trait Sister     Social History   Social History  . Marital status: Married    Spouse name: N/A  . Number of children: N/A  . Years of education: N/A   Occupational History  . Not on file.   Social History Main Topics  . Smoking status: Current Every Day Smoker    Packs/day: 0.00    Years: 42.00    Types: Cigarettes  . Smokeless tobacco: Never Used  . Alcohol use No  . Drug use:     Types: Marijuana     Comment: pt states he smokes every once in a while  . Sexual activity: Yes   Other Topics Concern  . Not on file   Social  History Narrative  . No narrative on file    Review of Systems: See HPI, otherwise negative ROS  Physical Exam: Ht 6\' 1"  (1.854 m)   Wt 165 lb (74.8 kg)   BMI 21.77 kg/m  General:   Alert,  pleasant and cooperative in NAD Head:  Normocephalic and atraumatic. Neck:  Supple; no masses or thyromegaly. Lungs:  Clear throughout to auscultation.    Heart:  Regular rate and rhythm. Abdomen:  Soft, nontender and nondistended. Normal bowel sounds, without guarding, and without rebound.   Neurologic:  Alert and  oriented x4;  grossly normal neurologically.  Impression/Plan: Tyler Cohen is now here to undergo a screening colonoscopy.  Risks, benefits, and alternatives regarding colonoscopy have been reviewed with the patient.  Questions have been answered.  All parties agreeable.

## 2015-12-02 ENCOUNTER — Encounter: Payer: Self-pay | Admitting: Gastroenterology

## 2015-12-03 ENCOUNTER — Encounter: Payer: Self-pay | Admitting: Gastroenterology

## 2015-12-04 ENCOUNTER — Encounter: Payer: Self-pay | Admitting: Gastroenterology

## 2015-12-22 ENCOUNTER — Other Ambulatory Visit: Payer: Self-pay | Admitting: Family Medicine

## 2016-02-17 ENCOUNTER — Ambulatory Visit (INDEPENDENT_AMBULATORY_CARE_PROVIDER_SITE_OTHER): Payer: Commercial Managed Care - HMO

## 2016-02-17 DIAGNOSIS — Z23 Encounter for immunization: Secondary | ICD-10-CM | POA: Diagnosis not present

## 2016-04-05 DIAGNOSIS — K769 Liver disease, unspecified: Secondary | ICD-10-CM

## 2016-04-05 DIAGNOSIS — M67441 Ganglion, right hand: Secondary | ICD-10-CM

## 2016-04-05 DIAGNOSIS — N2889 Other specified disorders of kidney and ureter: Secondary | ICD-10-CM

## 2016-04-05 HISTORY — DX: Other specified disorders of kidney and ureter: N28.89

## 2016-04-05 HISTORY — DX: Ganglion, right hand: M67.441

## 2016-04-05 HISTORY — DX: Liver disease, unspecified: K76.9

## 2016-04-09 ENCOUNTER — Emergency Department
Admission: EM | Admit: 2016-04-09 | Discharge: 2016-04-09 | Disposition: A | Discharge status: Home / Self Care | Payer: Medicare HMO | Attending: Emergency Medicine | Admitting: Emergency Medicine

## 2016-04-09 ENCOUNTER — Emergency Department: Payer: Medicare HMO

## 2016-04-09 ENCOUNTER — Encounter: Payer: Self-pay | Admitting: Emergency Medicine

## 2016-04-09 DIAGNOSIS — M545 Low back pain: Secondary | ICD-10-CM | POA: Insufficient documentation

## 2016-04-09 DIAGNOSIS — Y939 Activity, unspecified: Secondary | ICD-10-CM | POA: Insufficient documentation

## 2016-04-09 DIAGNOSIS — R0781 Pleurodynia: Secondary | ICD-10-CM | POA: Insufficient documentation

## 2016-04-09 DIAGNOSIS — Z043 Encounter for examination and observation following other accident: Secondary | ICD-10-CM | POA: Diagnosis not present

## 2016-04-09 DIAGNOSIS — F17210 Nicotine dependence, cigarettes, uncomplicated: Secondary | ICD-10-CM | POA: Insufficient documentation

## 2016-04-09 DIAGNOSIS — R05 Cough: Secondary | ICD-10-CM | POA: Diagnosis not present

## 2016-04-09 DIAGNOSIS — R079 Chest pain, unspecified: Secondary | ICD-10-CM | POA: Diagnosis not present

## 2016-04-09 DIAGNOSIS — W19XXXA Unspecified fall, initial encounter: Secondary | ICD-10-CM

## 2016-04-09 DIAGNOSIS — Y999 Unspecified external cause status: Secondary | ICD-10-CM | POA: Insufficient documentation

## 2016-04-09 DIAGNOSIS — W01198A Fall on same level from slipping, tripping and stumbling with subsequent striking against other object, initial encounter: Secondary | ICD-10-CM | POA: Diagnosis not present

## 2016-04-09 DIAGNOSIS — S0990XA Unspecified injury of head, initial encounter: Secondary | ICD-10-CM | POA: Diagnosis not present

## 2016-04-09 DIAGNOSIS — Y929 Unspecified place or not applicable: Secondary | ICD-10-CM | POA: Diagnosis not present

## 2016-04-09 MED ORDER — TRAMADOL HCL 50 MG PO TABS: 50.0000 mg | ORAL_TABLET | Freq: Two times a day (BID) | ORAL | 0 refills | Status: AC

## 2016-04-09 NOTE — ED Notes (Signed)
Patient c/o cough, nasal congestion X 2 days.

## 2016-04-09 NOTE — ED Triage Notes (Signed)
Patient states that he slipped on ice on wednesday. Patient states that he hit his head denies LOC. Patient denies states that he takes 81 mg asa daily. Patient with complaint of right rib pain and right lower back pain.

## 2016-04-09 NOTE — ED Notes (Signed)
Pt c/o fall Wednesday; patient reports injury to posterior head and right ribs/side, right back. Pt denies LOC. Pt reports pain with cough.

## 2016-04-09 NOTE — ED Provider Notes (Signed)
[facility] Emergency Department Provider Note  ____________________________________________  Time seen: Approximately 9:57 PM  I have reviewed the triage vital signs and the nursing notes.   HISTORY  Chief Complaint Fall; Chest Pain; and Back Pain    HPI Tyler Cohen is a 61 y.o. male presenting to the emergency department after slipping on ice 2 days ago. Patient fell from standing height. Patient states that since the incident, he has experienced right lateral rib pain at ribs 9-10. Patient also has mild low back pain. He rates lateral rib pain at 8/10 in intensity and describes it as aching. Patient states that he did hit his posterior head during the incident. However, he denies LOC or headache. He has tried [facility] powders, which has relieved his symptoms partially. Patient denies having abrasions or lacerations. He takes aspirin daily. He denies shortness of breath, pleuritic pain, nausea, vomiting, changes in vision, abdominal pain or disorientation. Patient rides his bicycle primarily for transportation.   Past Medical History:  Diagnosis Date  . Arthritis   . Heart murmur   . Hyperlipidemia   . Macrocytosis   . Thyroid disease   . Tobacco abuse     Patient Active Problem List   Diagnosis Date Noted  . Special screening for malignant neoplasms, colon   . Benign neoplasm of sigmoid colon   . Psoriasis 09/03/2015  . Tobacco abuse   . Macrocytosis   . Pain in the chest 08/29/2014  . Smoker 08/29/2014  . Hyperlipidemia 08/29/2014  . Dental disease 08/29/2014    Past Surgical History:  Procedure Laterality Date  . COLONOSCOPY WITH PROPOFOL N/A 12/01/2015   Procedure: COLONOSCOPY WITH PROPOFOL;  Surgeon: Lucilla Lame, MD;  Location: [place];  Service: Endoscopy;  Laterality: N/A;  . POLYPECTOMY  12/01/2015   Procedure: POLYPECTOMY;  Surgeon: Lucilla Lame, MD;  Location: [place];  Service: Endoscopy;;    Prior to Admission  medications   Medication Sig Start Date End Date Taking? Authorizing Provider  aspirin 81 MG tablet Take 81 mg by mouth daily.    Historical Provider, MD  atorvastatin (LIPITOR) 20 MG tablet TAKE 1 TABLET BY MOUTH AT BEDTIME 12/22/15   Volney American, PA-C  b complex vitamins tablet Take 1 tablet by mouth daily.    Historical Provider, MD  clotrimazole-betamethasone (LOTRISONE) cream Apply 1 application topically 2 (two) times daily. 09/03/15   Kathrine Haddock, NP  traMADol (ULTRAM) 50 MG tablet Take 1 tablet (50 mg total) by mouth 2 (two) times daily. 04/09/16 04/14/16  Lannie Fields, PA-C    Allergies Patient has no known allergies.  Family History  Problem Relation Age of Onset  . Hypertension Mother   . Diabetes Mother     lost both legs  . Kidney disease Mother   . Hypertension Father   . Emphysema Father   . Sickle cell trait Father   . Hypertension Brother   . Hyperlipidemia Brother   . Sickle cell trait Sister     Social History Social History  Substance Use Topics  . Smoking status: Current Every Day Smoker    Packs/day: 0.00    Years: 42.00    Types: Cigarettes  . Smokeless tobacco: Never Used  . Alcohol use No     Review of Systems  Constitutional: No major changes in activity. Eyes: No visual changes. Cardiovascular: no chest pain. Respiratory: no cough. No SOB. Gastrointestinal: No abdominal pain.  No nausea, no vomiting.  No diarrhea.  No constipation. Genitourinary: Negative for dysuria. No hematuria Musculoskeletal: She has right lateral rib pain. Skin: Negative for rash, abrasions, lacerations, ecchymosis. Neurological: Negative for headaches, focal weakness or numbness. 10-point ROS otherwise negative.  ____________________________________________   PHYSICAL EXAM:  VITAL SIGNS: ED Triage Vitals [04/09/16 2034]  Enc Vitals Group     BP 121/66     Pulse Rate 84     Resp 16     Temp 98.1 F (36.7 C)     Temp Source Oral     SpO2 98 %      Weight 170 lb (77.1 kg)     Height 6\' 1"  (1.854 m)     Head Circumference      Peak Flow      Pain Score 8     Pain Loc      Pain Edu?      Excl. in [place]?      Constitutional: Alert and oriented. Well appearing and in no acute distress.Patient is sitting crosslegged on the bed. Eyes: Conjunctivae are normal. PERRL. EOMI. Head: Atraumatic. Cardiovascular: Normal rate, regular rhythm. Normal S1 and S2.  Good peripheral circulation. Respiratory: Normal respiratory effort without tachypnea or retractions. Lungs CTAB. Good air entry to the bases with no decreased or absent breath sounds. Gastrointestinal: Bowel sounds 4 quadrants. Soft and nontender to palpation. No guarding or rigidity. No palpable masses. No distention. No CVA tenderness. Musculoskeletal: Patient has tenderness to palpation along the right lateral ribs, 9-10. Patient has no tenderness to palpation along the lumbar spine. Patient's low back pain is not intensified with extension and flexion at the spine. Negative straight leg raise test bilaterally. Neurologic: Normal speech and language. No gross focal neurologic deficits are appreciated. Cranial nerves: 2-10 normal as tested. Cerebellar: Finger-nose-finger WNL, heel to shin WNL. Vision: No visual field deficts noted to confrontation.  Speech: No dysarthria or expressive aphasia.  Skin:  Skin is warm, dry and intact. No rash noted. Psychiatric: Mood and affect are normal. Speech and behavior are normal. Patient exhibits appropriate insight and judgement.   ____________________________________________   LABS (all labs ordered are listed, but only abnormal results are displayed)  Labs Reviewed - No data to display ____________________________________________  EKG   ____________________________________________  RADIOLOGY Unk Pinto, personally viewed and evaluated these images (plain radiographs) as part of my medical decision making, as well as reviewing the  written report by the radiologist.  Dg Chest 2 View  Result Date: 04/09/2016 CLINICAL DATA:  Right-sided chest pain after falling two days ago. EXAM: CHEST  2 VIEW COMPARISON:  None. FINDINGS: The heart size and mediastinal contours are within normal limits. Both lungs are clear. The visualized skeletal structures are unremarkable. IMPRESSION: No active cardiopulmonary disease. Electronically Signed   By: Andreas Newport M.D.   On: 04/09/2016 21:20    ____________________________________________    PROCEDURES  Procedure(s) performed:    Procedures    Medications - No data to display   ____________________________________________   INITIAL IMPRESSION / ASSESSMENT AND PLAN / ED COURSE  Pertinent labs & imaging results that were available during my care of the patient were reviewed by me and considered in my medical decision making (see chart for details).  Review of the  [place]RS was performed in accordance of the [place] prior to dispensing any controlled drugs.  Clinical Course    Assessment and plan:  Fall Patient presents to the emergency department after slipping on ice two days ago. Patient reports right lateral  rib pain, 9-10. He also reports low back pain and hitting his posterior head during the incident.  Patient denies bowel or bladder incontinence as well as headache, nausea, vomiting and disorientation. Patient presents to the emergency department to assess right lateral rib pain. DG chest conducted in the emergency department reveals no acute rib fractures or pneumothorax. Patient was discharged with a 5 day course of tramadol to be used as needed pain. I did not prescribe patient NSAIDs as patient takes daily aspirin. Patient was referred to orthopedics, Dr. Roland Rack. Patient was advised to make an appointment in one week if right rib pain persists. All patient questions were answered. Vital signs are reassuring at this  time.     ____________________________________________  FINAL CLINICAL IMPRESSION(S) / ED DIAGNOSES  Final diagnoses:  Fall, initial encounter      NEW MEDICATIONS STARTED DURING THIS VISIT:  Discharge Medication List as of 04/09/2016 10:08 PM    START taking these medications   Details  traMADol (ULTRAM) 50 MG tablet Take 1 tablet (50 mg total) by mouth 2 (two) times daily., Starting Fri 04/09/2016, Until Wed 04/14/2016, Print            This chart was dictated using voice recognition software/Dragon. Despite best efforts to proofread, errors can occur which can change the meaning. Any change was purely unintentional.    Lannie Fields, PA-C 04/10/16 0041    Daymon Larsen, MD 04/10/16 [phone]

## 2016-09-07 DIAGNOSIS — M47896 Other spondylosis, lumbar region: Secondary | ICD-10-CM | POA: Diagnosis not present

## 2016-09-08 ENCOUNTER — Telehealth: Payer: Self-pay | Admitting: Unknown Physician Specialty

## 2016-09-08 NOTE — Telephone Encounter (Signed)
Called pt to schedule Annual Wellness Visit with [facility]  - knb  °

## 2016-09-09 ENCOUNTER — Other Ambulatory Visit: Payer: Self-pay | Admitting: Unknown Physician Specialty

## 2016-10-14 ENCOUNTER — Ambulatory Visit (INDEPENDENT_AMBULATORY_CARE_PROVIDER_SITE_OTHER): Payer: Medicare HMO

## 2016-10-14 VITALS — BP 106/62 | HR 73 | Temp 97.7°F | Resp 17 | Ht 74.0 in | Wt 163.4 lb

## 2016-10-14 DIAGNOSIS — Z0000 Encounter for general adult medical examination without abnormal findings: Secondary | ICD-10-CM

## 2016-10-14 NOTE — Progress Notes (Signed)
Subjective:   Tyler Cohen is a 61 y.o. male who presents for Medicare Annual/Subsequent preventive examination.  Review of Systems:   Cardiac Risk Factors include: dyslipidemia;smoking/ tobacco exposure;advanced age (>24men, >24 women);male gender     Objective:    Vitals: BP 106/62 (BP Location: Left Arm, Patient Position: Sitting)   Pulse 73   Temp 97.7 F (36.5 C)   Resp 17   Ht 6\' 2"  (1.88 m)   Wt 163 lb 6.4 oz (74.1 kg)   BMI 20.98 kg/m   Body mass index is 20.98 kg/m.  Tobacco History  Smoking Status  . Current Every Day Smoker  . Packs/day: 0.25  . Years: 42.00  . Types: Cigarettes  Smokeless Tobacco  . Never Used    Comment: 1 pack in 4 days      Ready to quit: Yes Counseling given: Yes   Past Medical History:  Diagnosis Date  . Arthritis   . Heart murmur   . Hyperlipidemia   . Macrocytosis   . Thyroid disease   . Tobacco abuse    Past Surgical History:  Procedure Laterality Date  . COLONOSCOPY WITH PROPOFOL N/A 12/01/2015   Procedure: COLONOSCOPY WITH PROPOFOL;  Surgeon: Lucilla Lame, MD;  Location: [place];  Service: Endoscopy;  Laterality: N/A;  . POLYPECTOMY  12/01/2015   Procedure: POLYPECTOMY;  Surgeon: Lucilla Lame, MD;  Location: [place];  Service: Endoscopy;;   Family History  Problem Relation Age of Onset  . Hypertension Mother   . Diabetes Mother        lost both legs  . Kidney disease Mother   . Hypertension Father   . Emphysema Father   . Sickle cell trait Father   . Hypertension Brother   . Hyperlipidemia Brother   . Sickle cell trait Sister    History  Sexual Activity  . Sexual activity: Not on file    Outpatient Encounter Prescriptions as of 10/14/2016  Medication Sig  . aspirin 81 MG tablet Take 81 mg by mouth daily.  Marland Kitchen atorvastatin (LIPITOR) 20 MG tablet TAKE 1 TABLET BY MOUTH AT BEDTIME  . b complex vitamins tablet Take 1 tablet by mouth daily.  . clotrimazole-betamethasone (LOTRISONE)  cream APPLY EXTERNALLY TO THE AFFECTED AREA TWICE DAILY   No facility-administered encounter medications on file as of 10/14/2016.     Activities of Daily Living In your present state of health, do you have any difficulty performing the following activities: 10/14/2016 12/01/2015  Hearing? N N  Vision? N N  Difficulty concentrating or making decisions? N N  Walking or climbing stairs? N N  Dressing or bathing? N N  Doing errands, shopping? N -  Preparing Food and eating ? N -  Using the Toilet? N -  In the past six months, have you accidently leaked urine? N -  Do you have problems with loss of bowel control? N -  Managing your Medications? N -  Managing your Finances? N -  Housekeeping or managing your Housekeeping? N -  Some recent data might be hidden    Patient Care Team: Kathrine Haddock, NP as PCP - General (Nurse Practitioner)   Assessment:     Exercise Activities and Dietary recommendations Current Exercise Habits: Home exercise routine, Time (Minutes): > 60, Frequency (Times/Week): 4, Weekly Exercise (Minutes/Week): 0, Intensity: Moderate, Exercise limited by: None identified  Goals    . Quit smoking / using tobacco  SMoking cessation disscussed      Fall Risk Fall Risk  10/14/2016 10/15/2015  Falls in the past year? No No   Depression Screen PHQ 2/9 Scores 10/14/2016 10/15/2015  PHQ - 2 Score 0 0    Cognitive Function     6CIT Screen 10/14/2016  What Year? 0 points  What month? 0 points  What time? 0 points  Count back from 20 0 points  Months in reverse 0 points  Repeat phrase 0 points  Total Score 0    Immunization History  Administered Date(s) Administered  . Influenza,inj,Quad PF,36+ Mos 02/17/2016  . Pneumococcal Polysaccharide-23 01/23/2014  . Tdap 10/24/2013   Screening Tests Health Maintenance  Topic Date Due  . INFLUENZA VACCINE  11/03/2016  . TETANUS/TDAP  10/25/2023  . COLONOSCOPY  11/30/2025  . Hepatitis C Screening   Completed  . HIV Screening  Completed      Plan:    I have personally reviewed and addressed the Medicare Annual Wellness questionnaire and have noted the following in the patient's chart:  A. Medical and social history B. Use of alcohol, tobacco or illicit drugs  C. Current medications and supplements D. Functional ability and status E.  Nutritional status F.  Physical activity G. Advance directives H. List of other physicians I.  Hospitalizations, surgeries, and ER visits in previous 12 months J.  [place] such as hearing and vision if needed, cognitive and depression L. Referrals and appointments  In addition, I have reviewed and discussed with patient certain preventive protocols, quality metrics, and best practice recommendations. A written personalized care plan for preventive services as well as general preventive health recommendations were provided to patient.   Signed,  Tyler Aas, LPN Nurse Health Advisor   MD Recommendations: requests refill on clotimazole

## 2016-10-14 NOTE — Patient Instructions (Signed)
Tyler Cohen , Thank you for taking time to come for your Medicare Wellness Visit. I appreciate your ongoing commitment to your health goals. Please review the following plan we discussed and let me know if I can assist you in the future.   Screening recommendations/referrals: Colonoscopy: completed 12/01/2015 Recommended yearly ophthalmology/optometry visit for glaucoma screening and checkup Recommended yearly dental visit for hygiene and checkup  Vaccinations: Influenza vaccine: up to date, due 02/2017 Pneumococcal vaccine: due at 75 Tdap vaccine: up to date Shingles vaccine: due, check with your insurance company for coverage   Advanced directives: Advance directive discussed with you today. I have provided a copy for you to complete at home and have notarized. Once this is complete please bring a copy in to our office so we can scan it into your chart.  Conditions/risks identified: Smoking cessation discussed  Next appointment: Follow up on 10/18/2016 at 10:00am with Regino Schultze. Follow up in one year for your annual wellness exam.   Preventive Care 40-64 Years, Male Preventive care refers to lifestyle choices and visits with your health care provider that can promote health and wellness. What does preventive care include?  A yearly physical exam. This is also called an annual well check.  Dental exams once or twice a year.  Routine eye exams. Ask your health care provider how often you should have your eyes checked.  Personal lifestyle choices, including:  Daily care of your teeth and gums.  Regular physical activity.  Eating a healthy diet.  Avoiding tobacco and drug use.  Limiting alcohol use.  Practicing safe sex.  Taking low-dose aspirin every day starting at age 50. What happens during an annual well check? The services and screenings done by your health care provider during your annual well check will depend on your age, overall health, lifestyle risk  factors, and family history of disease. Counseling  Your health care provider may ask you questions about your:  Alcohol use.  Tobacco use.  Drug use.  Emotional well-being.  Home and relationship well-being.  Sexual activity.  Eating habits.  Work and work Statistician. Screening  You may have the following tests or measurements:  Height, weight, and BMI.  Blood pressure.  Lipid and cholesterol levels. These may be checked every 5 years, or more frequently if you are over 41 years old.  Skin check.  Lung cancer screening. You may have this screening every year starting at age 9 if you have a 30-pack-year history of smoking and currently smoke or have quit within the past 15 years.  Fecal occult blood test (FOBT) of the stool. You may have this test every year starting at age 62.  Flexible sigmoidoscopy or colonoscopy. You may have a sigmoidoscopy every 5 years or a colonoscopy every 10 years starting at age 25.  Prostate cancer screening. Recommendations will vary depending on your family history and other risks.  Hepatitis C blood test.  Hepatitis B blood test.  Sexually transmitted disease (STD) testing.  Diabetes screening. This is done by checking your blood sugar (glucose) after you have not eaten for a while (fasting). You may have this done every 1-3 years. Discuss your test results, treatment options, and if necessary, the need for more tests with your health care provider. Vaccines  Your health care provider may recommend certain vaccines, such as:  Influenza vaccine. This is recommended every year.  Tetanus, diphtheria, and acellular pertussis (Tdap, Td) vaccine. You may need a Td booster every 10 years.  Zoster  vaccine. You may need this after age 71.  Pneumococcal 13-valent conjugate (PCV13) vaccine. You may need this if you have certain conditions and have not been vaccinated.  Pneumococcal polysaccharide (PPSV23) vaccine. You may need one or two  doses if you smoke cigarettes or if you have certain conditions. Talk to your health care provider about which screenings and vaccines you need and how often you need them. This information is not intended to replace advice given to you by your health care provider. Make sure you discuss any questions you have with your health care provider. Document Released: 04/18/2015 Document Revised: 12/10/2015 Document Reviewed: 01/21/2015 Elsevier Interactive Patient Education  2017 [employer] Prevention in the Home Falls can cause injuries. They can happen to people of all ages. There are many things you can do to make your home safe and to help prevent falls. What can I do on the outside of my home?  Regularly fix the edges of walkways and driveways and fix any cracks.  Remove anything that might make you trip as you walk through a door, such as a raised step or threshold.  Trim any bushes or trees on the path to your home.  Use bright outdoor lighting.  Clear any walking paths of anything that might make someone trip, such as rocks or tools.  Regularly check to see if handrails are loose or broken. Make sure that both sides of any steps have handrails.  Any raised decks and porches should have guardrails on the edges.  Have any leaves, snow, or ice cleared regularly.  Use sand or salt on walking paths during winter.  Clean up any spills in your garage right away. This includes oil or grease spills. What can I do in the bathroom?  Use night lights.  Install grab bars by the toilet and in the tub and shower. Do not use towel bars as grab bars.  Use non-skid mats or decals in the tub or shower.  If you need to sit down in the shower, use a plastic, non-slip stool.  Keep the floor dry. Clean up any water that spills on the floor as soon as it happens.  Remove soap buildup in the tub or shower regularly.  Attach bath mats securely with double-sided non-slip rug tape.  Do  not have throw rugs and other things on the floor that can make you trip. What can I do in the bedroom?  Use night lights.  Make sure that you have a light by your bed that is easy to reach.  Do not use any sheets or blankets that are too big for your bed. They should not hang down onto the floor.  Have a firm chair that has side arms. You can use this for support while you get dressed.  Do not have throw rugs and other things on the floor that can make you trip. What can I do in the kitchen?  Clean up any spills right away.  Avoid walking on wet floors.  Keep items that you use a lot in easy-to-reach places.  If you need to reach something above you, use a strong step stool that has a grab bar.  Keep electrical cords out of the way.  Do not use floor polish or wax that makes floors slippery. If you must use wax, use non-skid floor wax.  Do not have throw rugs and other things on the floor that can make you trip. What can I do with my  stairs?  Do not leave any items on the stairs.  Make sure that there are handrails on both sides of the stairs and use them. Fix handrails that are broken or loose. Make sure that handrails are as long as the stairways.  Check any carpeting to make sure that it is firmly attached to the stairs. Fix any carpet that is loose or worn.  Avoid having throw rugs at the top or bottom of the stairs. If you do have throw rugs, attach them to the floor with carpet tape.  Make sure that you have a light switch at the top of the stairs and the bottom of the stairs. If you do not have them, ask someone to add them for you. What else can I do to help prevent falls?  Wear shoes that:  Do not have high heels.  Have rubber bottoms.  Are comfortable and fit you well.  Are closed at the toe. Do not wear sandals.  If you use a stepladder:  Make sure that it is fully opened. Do not climb a closed stepladder.  Make sure that both sides of the stepladder  are locked into place.  Ask someone to hold it for you, if possible.  Clearly mark and make sure that you can see:  Any grab bars or handrails.  First and last steps.  Where the edge of each step is.  Use tools that help you move around (mobility aids) if they are needed. These include:  Canes.  Walkers.  Scooters.  Crutches.  Turn on the lights when you go into a dark area. Replace any light bulbs as soon as they burn out.  Set up your furniture so you have a clear path. Avoid moving your furniture around.  If any of your floors are uneven, fix them.  If there are any pets around you, be aware of where they are.  Review your medicines with your doctor. Some medicines can make you feel dizzy. This can increase your chance of falling. Ask your doctor what other things that you can do to help prevent falls. This information is not intended to replace advice given to you by your health care provider. Make sure you discuss any questions you have with your health care provider. Document Released: 01/16/2009 Document Revised: 08/28/2015 Document Reviewed: 04/26/2014 Elsevier Interactive Patient Education  2017 [employer].   Steps to Quit Smoking Smoking tobacco can be bad for your health. It can also affect almost every organ in your body. Smoking puts you and people around you at risk for many serious long-lasting (chronic) diseases. Quitting smoking is hard, but it is one of the best things that you can do for your health. It is never too late to quit. What are the benefits of quitting smoking? When you quit smoking, you lower your risk for getting serious diseases and conditions. They can include:  Lung cancer or lung disease.  Heart disease.  Stroke.  Heart attack.  Not being able to have children (infertility).  Weak bones (osteoporosis) and broken bones (fractures).  If you have coughing, wheezing, and shortness of breath, those symptoms may get better when  you quit. You may also get sick less often. If you are pregnant, quitting smoking can help to lower your chances of having a baby of low birth weight. What can I do to help me quit smoking? Talk with your doctor about what can help you quit smoking. Some things you can do (strategies) include:  Quitting smoking totally, instead of slowly cutting back how much you smoke over a period of time.  Going to in-person counseling. You are more likely to quit if you go to many counseling sessions.  Using resources and support systems, such as: ? Database administrator with a Social worker. ? Phone quitlines. ? Careers information officer. ? Support groups or group counseling. ? Text messaging programs. ? Mobile phone apps or applications.  Taking medicines. Some of these medicines may have nicotine in them. If you are pregnant or breastfeeding, do not take any medicines to quit smoking unless your doctor says it is okay. Talk with your doctor about counseling or other things that can help you.  Talk with your doctor about using more than one strategy at the same time, such as taking medicines while you are also going to in-person counseling. This can help make quitting easier. What things can I do to make it easier to quit? Quitting smoking might feel very hard at first, but there is a lot that you can do to make it easier. Take these steps:  Talk to your family and friends. Ask them to support and encourage you.  Call phone quitlines, reach out to support groups, or work with a Social worker.  Ask people who smoke to not smoke around you.  Avoid places that make you want (trigger) to smoke, such as: ? Bars. ? Parties. ? Smoke-break areas at work.  Spend time with people who do not smoke.  Lower the stress in your life. Stress can make you want to smoke. Try these things to help your stress: ? Getting regular exercise. ? Deep-breathing exercises. ? Yoga. ? Meditating. ? Doing a body scan. To do this,  close your eyes, focus on one area of your body at a time from head to toe, and notice which parts of your body are tense. Try to relax the muscles in those areas.  Download or buy apps on your mobile phone or tablet that can help you stick to your quit plan. There are many free apps, such as QuitGuide from the [employer] Office manager for Disease Control and Prevention). You can find more support from smokefree.gov and other websites.  This information is not intended to replace advice given to you by your health care provider. Make sure you discuss any questions you have with your health care provider. Document Released: 01/16/2009 Document Revised: 11/18/2015 Document Reviewed: 08/06/2014 Elsevier Interactive Patient Education  2018 [employer].

## 2016-10-18 ENCOUNTER — Encounter: Payer: Commercial Managed Care - HMO | Admitting: Unknown Physician Specialty

## 2016-11-02 ENCOUNTER — Encounter: Payer: Self-pay | Admitting: Unknown Physician Specialty

## 2016-11-02 ENCOUNTER — Ambulatory Visit (INDEPENDENT_AMBULATORY_CARE_PROVIDER_SITE_OTHER): Payer: Medicare HMO | Admitting: Unknown Physician Specialty

## 2016-11-02 VITALS — BP 110/72 | HR 73 | Temp 98.3°F | Wt 165.2 lb

## 2016-11-02 DIAGNOSIS — Z7189 Other specified counseling: Secondary | ICD-10-CM | POA: Diagnosis not present

## 2016-11-02 DIAGNOSIS — Z720 Tobacco use: Secondary | ICD-10-CM | POA: Diagnosis not present

## 2016-11-02 DIAGNOSIS — M67441 Ganglion, right hand: Secondary | ICD-10-CM | POA: Diagnosis not present

## 2016-11-02 DIAGNOSIS — Z5181 Encounter for therapeutic drug level monitoring: Secondary | ICD-10-CM | POA: Diagnosis not present

## 2016-11-02 DIAGNOSIS — E7800 Pure hypercholesterolemia, unspecified: Secondary | ICD-10-CM

## 2016-11-02 DIAGNOSIS — Z0000 Encounter for general adult medical examination without abnormal findings: Secondary | ICD-10-CM

## 2016-11-02 DIAGNOSIS — D7589 Other specified diseases of blood and blood-forming organs: Secondary | ICD-10-CM | POA: Diagnosis not present

## 2016-11-02 DIAGNOSIS — Z0001 Encounter for general adult medical examination with abnormal findings: Secondary | ICD-10-CM

## 2016-11-02 MED ORDER — CLOTRIMAZOLE-BETAMETHASONE 1-0.05 % EX CREA: TOPICAL_CREAM | Freq: Two times a day (BID) | CUTANEOUS | 0 refills | Status: DC

## 2016-11-02 NOTE — Assessment & Plan Note (Signed)
A voluntary discussion about advance care planning including the explanation and discussion of advance directives was extensively discussed  with the patient.  Explanation about the health care proxy and Living will was reviewed and packet with forms with explanation of how to fill them out was given.  During this discussion, the patient was able to identify a health care proxy as his sister and plans to fill out the paperwork required.  Patient was offered a separate [place] visit for further assistance with forms.

## 2016-11-02 NOTE — Assessment & Plan Note (Signed)
Stable, continue present medications.   

## 2016-11-02 NOTE — Progress Notes (Signed)
BP 110/72   Pulse 73   Temp 98.3 F (36.8 C)   Wt 165 lb 3.2 oz (74.9 kg)   SpO2 97%   BMI 21.21 kg/m    Subjective:    Patient ID: Tyler Cohen, male    DOB: [birth year], 61 y.o.   MRN: [id number]  HPI: Tyler Cohen is a 61 y.o. male  Chief Complaint  Patient presents with  . Annual Exam    pt had Wellness visit with [facility] on 10/14/16  . Cyst    pt states he has a knot that came up on his finger a little while back, states he was cut by a lawn mower blade but is not painful. States the knot has been there since.    Pt is here for a physical.  Pt has 2 comlaints  Hiccups-  Gets them every now and then and last for a couple of minutes  Cyst on right little finger above MTP.  Not painful    Hyperlipidemia Using medications without problems: No Muscle aches  Diet compliance: eats well Exercise: bikes    Social History   Social History  . Marital status: Married    Spouse name: N/A  . Number of children: N/A  . Years of education: N/A   Occupational History  . Not on file.   Social History Main Topics  . Smoking status: Current Every Day Smoker    Packs/day: 0.25    Years: 42.00    Types: Cigarettes  . Smokeless tobacco: Never Used     Comment: 1 pack in 4 days   . Alcohol use No  . Drug use: Yes    Types: Marijuana     Comment: pt states he smokes every once in a while  . Sexual activity: Not on file   Other Topics Concern  . Not on file   Social History Narrative  . No narrative on file   Family History  Problem Relation Age of Onset  . Hypertension Mother   . Diabetes Mother        lost both legs  . Kidney disease Mother   . Hypertension Father   . Emphysema Father   . Sickle cell trait Father   . Hypertension Brother   . Hyperlipidemia Brother   . Sickle cell trait Sister    Past Medical History:  Diagnosis Date  . Arthritis   . Heart murmur   . Hyperlipidemia   . Macrocytosis   . Thyroid disease   . Tobacco abuse    Past  Surgical History:  Procedure Laterality Date  . COLONOSCOPY WITH PROPOFOL N/A 12/01/2015   Procedure: COLONOSCOPY WITH PROPOFOL;  Surgeon: Lucilla Lame, MD;  Location: [place];  Service: Endoscopy;  Laterality: N/A;  . POLYPECTOMY  12/01/2015   Procedure: POLYPECTOMY;  Surgeon: Lucilla Lame, MD;  Location: [place];  Service: Endoscopy;;   Depression screen [facility] 2/9 10/14/2016 10/15/2015  Decreased Interest 0 0  Down, Depressed, Hopeless 0 0  PHQ - 2 Score 0 0     Relevant past medical, surgical, family and social history reviewed and updated as indicated. Interim medical history since our last visit reviewed. Allergies and medications reviewed and updated.  Review of Systems  Constitutional: Negative.   HENT: Negative.   Eyes: Negative.   Respiratory: Negative.   Cardiovascular: Negative.   Gastrointestinal: Negative.   Endocrine: Negative.   Genitourinary: Negative.   Musculoskeletal: Negative.   Allergic/Immunologic: Negative.  Neurological: Negative.   Hematological: Negative.   Psychiatric/Behavioral: Negative.     Per HPI unless specifically indicated above     Objective:    BP 110/72   Pulse 73   Temp 98.3 F (36.8 C)   Wt 165 lb 3.2 oz (74.9 kg)   SpO2 97%   BMI 21.21 kg/m   Wt Readings from Last 3 Encounters:  11/02/16 165 lb 3.2 oz (74.9 kg)  10/14/16 163 lb 6.4 oz (74.1 kg)  04/09/16 170 lb (77.1 kg)    Physical Exam  Constitutional: He is oriented to person, place, and time. He appears well-developed and well-nourished. No distress.  HENT:  Head: Normocephalic and atraumatic.  Mouth/Throat: Abnormal dentition.  Multiple missing teeth  Eyes: Conjunctivae and lids are normal. Right eye exhibits no discharge. Left eye exhibits no discharge. No scleral icterus.  Neck: Normal range of motion. Neck supple. No JVD present. Carotid bruit is not present.  Cardiovascular: Normal rate, regular rhythm and normal heart sounds.     Pulmonary/Chest: Effort normal and breath sounds normal. No respiratory distress.  Abdominal: Soft. Normal appearance and bowel sounds are normal. He exhibits no distension. There is no splenomegaly or hepatomegaly. There is tenderness.  Musculoskeletal: Normal range of motion.  Cyst right pinkie finger near PIP  Neurological: He is alert and oriented to person, place, and time.  Skin: Skin is warm, dry and intact. No rash noted. No pallor.  Psychiatric: He has a normal mood and affect. His behavior is normal. Judgment and thought content normal.    Results for orders placed or performed in visit on 09/03/15  Hepatitis C antibody  Result Value Ref Range   Hep C Virus Ab <0.1 0.0 - 0.9 s/co ratio  HIV antibody  Result Value Ref Range   HIV Screen 4th Generation wRfx Non Reactive Non Reactive  Lipid Panel w/o Chol/HDL Ratio  Result Value Ref Range   Cholesterol, Total 159 100 - 199 mg/dL   Triglycerides 51 0 - 149 mg/dL   HDL 54 >39 mg/dL   VLDL Cholesterol Cal 10 5 - 40 mg/dL   LDL Calculated 95 0 - 99 mg/dL  Comprehensive metabolic panel  Result Value Ref Range   Glucose 83 65 - 99 mg/dL   BUN 15 6 - 24 mg/dL   Creatinine, Ser 0.99 0.76 - 1.27 mg/dL   GFR calc non Af Amer 83 >59 mL/min/1.73   GFR calc Af Amer 96 >59 mL/min/1.73   BUN/Creatinine Ratio 15 9 - 20   Sodium 138 134 - 144 mmol/L   Potassium 4.5 3.5 - 5.2 mmol/L   Chloride 102 96 - 106 mmol/L   CO2 23 18 - 29 mmol/L   Calcium 9.4 8.7 - 10.2 mg/dL   Total Protein 6.7 6.0 - 8.5 g/dL   Albumin 4.0 3.5 - 5.5 g/dL   Globulin, Total 2.7 1.5 - 4.5 g/dL   Albumin/Globulin Ratio 1.5 1.2 - 2.2   Bilirubin Total 0.4 0.0 - 1.2 mg/dL   Alkaline Phosphatase 62 39 - 117 IU/L   AST 19 0 - 40 IU/L   ALT 17 0 - 44 IU/L      Assessment & Plan:   Problem List Items Addressed This Visit      Unprioritized   Advanced care planning/counseling discussion    A voluntary discussion about advance care planning including the  explanation and discussion of advance directives was extensively discussed  with the patient.  Explanation about the health care  proxy and Living will was reviewed and packet with forms with explanation of how to fill them out was given.  During this discussion, the patient was able to identify a health care proxy as his sister and plans to fill out the paperwork required.  Patient was offered a separate [place] visit for further assistance with forms.         Digital mucinous cyst of finger of right hand    New problem.  Not interested in going to Orthopedics.  Will monitor      Hyperlipidemia    Stable, continue present medications.        Relevant Orders   Lipid Panel w/o Chol/HDL Ratio   Macrocytosis - Primary   Relevant Orders   CBC with Differential/Platelet   Tobacco abuse     I have recommended absolute tobacco cessation. I have discussed various options available for assistance with tobacco cessation including over the counter methods (Nicotine gum, patch and lozenges). We also discussed prescription options (Chantix, Nicotine Inhaler / Nasal Spray). The patient is not interested in pursuing any prescription tobacco cessation options at this time and thinks he can quit.   on his own.  Low dose CT recommended        Other Visit Diagnoses    Medication monitoring encounter       Relevant Orders   Comprehensive metabolic panel   Lipid Panel w/o Chol/HDL Ratio   Annual physical exam           Follow up plan: No Follow-up on file.

## 2016-11-02 NOTE — Assessment & Plan Note (Addendum)
I have recommended absolute tobacco cessation. I have discussed various options available for assistance with tobacco cessation including over the counter methods (Nicotine gum, patch and lozenges). We also discussed prescription options (Chantix, Nicotine Inhaler / Nasal Spray). The patient is not interested in pursuing any prescription tobacco cessation options at this time and thinks he can quit.   on his own.  Low dose CT recommended

## 2016-11-02 NOTE — Assessment & Plan Note (Signed)
New problem.  Not interested in going to Orthopedics.  Will monitor

## 2016-11-03 ENCOUNTER — Telehealth: Payer: Self-pay | Admitting: *Deleted

## 2016-11-03 ENCOUNTER — Telehealth: Payer: Self-pay | Admitting: Unknown Physician Specialty

## 2016-11-03 ENCOUNTER — Encounter: Payer: Self-pay | Admitting: Unknown Physician Specialty

## 2016-11-03 DIAGNOSIS — Z87891 Personal history of nicotine dependence: Secondary | ICD-10-CM

## 2016-11-03 LAB — COMPREHENSIVE METABOLIC PANEL
ALT: 22 IU/L (ref 0–44)
AST: 16 IU/L (ref 0–40)
Albumin/Globulin Ratio: 1.5 (ref 1.2–2.2)
Albumin: 3.8 g/dL (ref 3.6–4.8)
Alkaline Phosphatase: 69 IU/L (ref 39–117)
BUN / CREAT RATIO: 12 (ref 10–24)
BUN: 11 mg/dL (ref 8–27)
Bilirubin Total: 0.4 mg/dL (ref 0.0–1.2)
CALCIUM: 9.2 mg/dL (ref 8.6–10.2)
CO2: 23 mmol/L (ref 20–29)
CREATININE: 0.94 mg/dL (ref 0.76–1.27)
Chloride: 104 mmol/L (ref 96–106)
GFR calc non Af Amer: 88 mL/min/{1.73_m2} (ref >59)
GFR, EST AFRICAN AMERICAN: 101 mL/min/{1.73_m2} (ref >59)
GLUCOSE: 96 mg/dL (ref 65–99)
Globulin, Total: 2.5 g/dL (ref 1.5–4.5)
Potassium: 4.2 mmol/L (ref 3.5–5.2)
SODIUM: 142 mmol/L (ref 134–144)
Total Protein: 6.3 g/dL (ref 6.0–8.5)

## 2016-11-03 LAB — CBC WITH DIFFERENTIAL/PLATELET
BASOS: 0 %
Basophils Absolute: 0 10*3/uL (ref 0.0–0.2)
EOS (ABSOLUTE): 0.3 10*3/uL (ref 0.0–0.4)
Eos: 5 %
HEMATOCRIT: 44.1 % (ref 37.5–51.0)
HEMOGLOBIN: 15 g/dL (ref 13.0–17.7)
Immature Grans (Abs): 0 10*3/uL (ref 0.0–0.1)
Immature Granulocytes: 0 %
LYMPHS ABS: 2.3 10*3/uL (ref 0.7–3.1)
Lymphs: 32 %
MCH: 33.2 pg — AB (ref 26.6–33.0)
MCHC: 34 g/dL (ref 31.5–35.7)
MCV: 98 fL — AB (ref 79–97)
MONOCYTES: 6 %
Monocytes Absolute: 0.5 10*3/uL (ref 0.1–0.9)
NEUTROS ABS: 4 10*3/uL (ref 1.4–7.0)
Neutrophils: 57 %
Platelets: 192 10*3/uL (ref 150–379)
RBC: 4.52 x10E6/uL (ref 4.14–5.80)
RDW: 13.7 % (ref 12.3–15.4)
WBC: 7.1 10*3/uL (ref 3.4–10.8)

## 2016-11-03 LAB — LIPID PANEL W/O CHOL/HDL RATIO
Cholesterol, Total: 173 mg/dL (ref 100–199)
HDL: 54 mg/dL (ref >39)
LDL CALC: 107 mg/dL — AB (ref 0–99)
Triglycerides: 61 mg/dL (ref 0–149)
VLDL Cholesterol Cal: 12 mg/dL (ref 5–40)

## 2016-11-03 NOTE — Telephone Encounter (Signed)
Received referral for initial lung cancer screening scan. Contacted patient and obtained smoking history,(current, 31.5 pack year) as well as answering questions related to screening process. Patient denies signs of lung cancer such as weight loss or hemoptysis. Patient denies comorbidity that would prevent curative treatment if lung cancer were found. Patient is scheduled for shared decision making visit and CT scan on 11/09/16.

## 2016-11-03 NOTE — Telephone Encounter (Signed)
Note in error.

## 2016-11-09 ENCOUNTER — Inpatient Hospital Stay: Payer: Medicare HMO | Attending: Oncology | Admitting: Oncology

## 2016-11-09 ENCOUNTER — Encounter: Payer: Self-pay | Admitting: Oncology

## 2016-11-09 ENCOUNTER — Ambulatory Visit
Admission: RE | Admit: 2016-11-09 | Discharge: 2016-11-09 | Disposition: A | Discharge status: Home / Self Care | Payer: Medicare HMO | Source: Ambulatory Visit | Attending: Oncology | Admitting: Oncology

## 2016-11-09 DIAGNOSIS — Z87891 Personal history of nicotine dependence: Secondary | ICD-10-CM

## 2016-11-09 DIAGNOSIS — J439 Emphysema, unspecified: Secondary | ICD-10-CM | POA: Diagnosis not present

## 2016-11-09 DIAGNOSIS — Z122 Encounter for screening for malignant neoplasm of respiratory organs: Secondary | ICD-10-CM | POA: Insufficient documentation

## 2016-11-09 DIAGNOSIS — F17210 Nicotine dependence, cigarettes, uncomplicated: Secondary | ICD-10-CM

## 2016-11-09 DIAGNOSIS — E041 Nontoxic single thyroid nodule: Secondary | ICD-10-CM | POA: Insufficient documentation

## 2016-11-09 NOTE — Progress Notes (Signed)
In accordance with CMS guidelines, patient has met eligibility criteria including age, absence of signs or symptoms of lung cancer.  Social History  Substance Use Topics  . Smoking status: Current Every Day Smoker    Packs/day: 0.75    Years: 42.00    Types: Cigarettes  . Smokeless tobacco: Never Used     Comment: 1 pack in 4 days   . Alcohol use No     A shared decision-making session was conducted prior to the performance of CT scan. This includes one or more decision aids, includes benefits and harms of screening, follow-up diagnostic testing, over-diagnosis, false positive rate, and total radiation exposure.  Counseling on the importance of adherence to annual lung cancer LDCT screening, impact of co-morbidities, and ability or willingness to undergo diagnosis and treatment is imperative for compliance of the program.  Counseling on the importance of continued smoking cessation for former smokers; the importance of smoking cessation for current smokers, and information about tobacco cessation interventions have been given to patient including [place] and [address] quit [place] programs.  Written order for lung cancer screening with LDCT has been given to the patient and any and all questions have been answered to the best of my abilities.   Yearly follow up will be coordinated by Burgess Estelle, Thoracic Navigator.  Faythe Casa, NP 11/09/2016 3:41 PM

## 2016-11-11 ENCOUNTER — Telehealth: Payer: Self-pay | Admitting: *Deleted

## 2016-11-11 NOTE — Telephone Encounter (Signed)
Notified patient of LDCT lung cancer screening program results with recommendation for 12 month follow up imaging. Also notified of incidental findings noted below and is encouraged to discuss further with PCP who will receive a copy of this note and/or the CT report. Patient is aware that he may need to have further imaging of liver finding. Patient verbalizes understanding.   IMPRESSION: 1. Lung-RADS 2-S, benign appearance or behavior. Continue annual screening with low-dose chest CT without contrast in 12 months. 2. The "S" modifier above refers to potentially clinically significant non lung cancer related findings. Specifically, indeterminate low-attenuation 1.7 cm right liver dome lesion. MRI abdomen without and with IV contrast recommended for further characterization. This recommendation follows ACR consensus guidelines: Managing Incidental Findings on Abdominal CT: White Paper of the ACR Incidental Findings Committee. J Am Coll Radiol 2010;7:754-773. 3. Right thyroid lobe 2.9 cm nodule. Thyroid ultrasound correlation is warranted. This follows ACR consensus guidelines: Managing Incidental Thyroid Nodules Detected on Imaging: White Paper of the ACR Incidental Thyroid Findings Committee. J Am Coll Radiol 2015; 12:143-150.

## 2016-11-29 ENCOUNTER — Telehealth: Payer: Self-pay | Admitting: Unknown Physician Specialty

## 2016-11-29 ENCOUNTER — Ambulatory Visit (INDEPENDENT_AMBULATORY_CARE_PROVIDER_SITE_OTHER): Payer: Medicare HMO | Admitting: Unknown Physician Specialty

## 2016-11-29 ENCOUNTER — Encounter: Payer: Self-pay | Admitting: Unknown Physician Specialty

## 2016-11-29 DIAGNOSIS — K769 Liver disease, unspecified: Secondary | ICD-10-CM | POA: Diagnosis not present

## 2016-11-29 NOTE — Progress Notes (Signed)
BP 110/72   Pulse 73   Temp 98.2 F (36.8 C)   Wt 166 lb (75.3 kg)   SpO2 96%   BMI 21.90 kg/m    Subjective:    Patient ID: Tyler Cohen, male    DOB: [birth year], 61 y.o.   MRN: [id number]  HPI: Tyler Cohen is a 61 y.o. male  Chief Complaint  Patient presents with  . Labs Only    pt states he is here for liver tests   Pt had a routine low dose chest CT for tobacco.  On the exam, an indeterminate low-attenuation 1.7 cm right liver dome lesion. Pt experiencing now problems of abdominal pain.  Last liver enzymes are normal  Relevant past medical, surgical, family and social history reviewed and updated as indicated. Interim medical history since our last visit reviewed. Allergies and medications reviewed and updated.  Review of Systems  Per HPI unless specifically indicated above     Objective:    BP 110/72   Pulse 73   Temp 98.2 F (36.8 C)   Wt 166 lb (75.3 kg)   SpO2 96%   BMI 21.90 kg/m   Wt Readings from Last 3 Encounters:  11/29/16 166 lb (75.3 kg)  11/09/16 165 lb (74.8 kg)  11/02/16 165 lb 3.2 oz (74.9 kg)    Physical Exam  Constitutional: He is oriented to person, place, and time. He appears well-developed and well-nourished. No distress.  HENT:  Head: Normocephalic and atraumatic.  Eyes: Conjunctivae and lids are normal. Right eye exhibits no discharge. Left eye exhibits no discharge. No scleral icterus.  Cardiovascular: Normal rate.   Pulmonary/Chest: Effort normal.  Abdominal: Normal appearance. There is no splenomegaly or hepatomegaly.  Musculoskeletal: Normal range of motion.  Neurological: He is alert and oriented to person, place, and time.  Skin: Skin is intact. No rash noted. No pallor.  Psychiatric: He has a normal mood and affect. His behavior is normal. Judgment and thought content normal.    Results for orders placed or performed in visit on 11/02/16  Comprehensive metabolic panel  Result Value Ref Range   Glucose 96 65 - 99  mg/dL   BUN 11 8 - 27 mg/dL   Creatinine, Ser 0.94 0.76 - 1.27 mg/dL   GFR calc non Af Amer 88 >59 mL/min/1.73   GFR calc Af Amer 101 >59 mL/min/1.73   BUN/Creatinine Ratio 12 10 - 24   Sodium 142 134 - 144 mmol/L   Potassium 4.2 3.5 - 5.2 mmol/L   Chloride 104 96 - 106 mmol/L   CO2 23 20 - 29 mmol/L   Calcium 9.2 8.6 - 10.2 mg/dL   Total Protein 6.3 6.0 - 8.5 g/dL   Albumin 3.8 3.6 - 4.8 g/dL   Globulin, Total 2.5 1.5 - 4.5 g/dL   Albumin/Globulin Ratio 1.5 1.2 - 2.2   Bilirubin Total 0.4 0.0 - 1.2 mg/dL   Alkaline Phosphatase 69 39 - 117 IU/L   AST 16 0 - 40 IU/L   ALT 22 0 - 44 IU/L  CBC with Differential/Platelet  Result Value Ref Range   WBC 7.1 3.4 - 10.8 x10E3/uL   RBC 4.52 4.14 - 5.80 x10E6/uL   Hemoglobin 15.0 13.0 - 17.7 g/dL   Hematocrit 44.1 37.5 - 51.0 %   MCV 98 (H) 79 - 97 fL   MCH 33.2 (H) 26.6 - 33.0 pg   MCHC 34.0 31.5 - 35.7 g/dL   RDW 13.7 12.3 - 15.4 %  Platelets 192 150 - 379 x10E3/uL   Neutrophils 57 Not Estab. %   Lymphs 32 Not Estab. %   Monocytes 6 Not Estab. %   Eos 5 Not Estab. %   Basos 0 Not Estab. %   Neutrophils Absolute 4.0 1.4 - 7.0 x10E3/uL   Lymphocytes Absolute 2.3 0.7 - 3.1 x10E3/uL   Monocytes Absolute 0.5 0.1 - 0.9 x10E3/uL   EOS (ABSOLUTE) 0.3 0.0 - 0.4 x10E3/uL   Basophils Absolute 0.0 0.0 - 0.2 x10E3/uL   Immature Granulocytes 0 Not Estab. %   Immature Grans (Abs) 0.0 0.0 - 0.1 x10E3/uL  Lipid Panel w/o Chol/HDL Ratio  Result Value Ref Range   Cholesterol, Total 173 100 - 199 mg/dL   Triglycerides 61 0 - 149 mg/dL   HDL 54 >39 mg/dL   VLDL Cholesterol Cal 12 5 - 40 mg/dL   LDL Calculated 107 (H) 0 - 99 mg/dL      Assessment & Plan:   Problem List Items Addressed This Visit      Unprioritized   Liver lesion    1.7 cm found on low dose chest CT screening due to tobacco use       Relevant Orders   MR Abdomen W Wo Contrast    Other Visit Diagnoses    Liver disease       No known liver disease but an auto  diagnosis from pick list reasoning for MRI   Relevant Orders   MR Abdomen W Wo Contrast       Follow up plan: results

## 2016-11-29 NOTE — Telephone Encounter (Signed)
FYI: Patient's order for MRI through [facility] requesting medical records on patient. Gerald Stabs from [facility] is sending over a fax in regards to requesting medical records.

## 2016-11-29 NOTE — Assessment & Plan Note (Signed)
1.7 cm found on low dose chest CT screening due to tobacco use

## 2016-11-30 NOTE — Telephone Encounter (Signed)
Fax received and information faxed back.

## 2016-12-08 ENCOUNTER — Telehealth: Payer: Self-pay | Admitting: *Deleted

## 2016-12-08 NOTE — Telephone Encounter (Signed)
[facility] Requesting  a Change to Facility Name doing the Actual MRI.  Mayra @ [employer] Assited in Changing the correct Facility name to [facility].  Auth # [id number], Ref ID #: [id number], Phone #  [phone].  Authorization Approved for 12/01/16 --- 12/31/16 for CPT Code [phone]

## 2016-12-10 ENCOUNTER — Ambulatory Visit
Admission: RE | Admit: 2016-12-10 | Discharge: 2016-12-10 | Disposition: A | Discharge status: Home / Self Care | Payer: Medicare HMO | Source: Ambulatory Visit | Attending: Unknown Physician Specialty | Admitting: Unknown Physician Specialty

## 2016-12-10 DIAGNOSIS — K769 Liver disease, unspecified: Secondary | ICD-10-CM

## 2016-12-10 DIAGNOSIS — D1803 Hemangioma of intra-abdominal structures: Secondary | ICD-10-CM | POA: Diagnosis not present

## 2016-12-10 DIAGNOSIS — D1809 Hemangioma of other sites: Secondary | ICD-10-CM | POA: Diagnosis not present

## 2016-12-10 MED ORDER — GADOBENATE DIMEGLUMINE 529 MG/ML IV SOLN
15.0000 mL | Freq: Once | INTRAVENOUS | Status: AC | PRN
  Administered 2016-12-10: 15 mL via INTRAVENOUS

## 2016-12-24 ENCOUNTER — Telehealth: Payer: Self-pay | Admitting: Unknown Physician Specialty

## 2016-12-24 DIAGNOSIS — E0789 Other specified disorders of thyroid: Secondary | ICD-10-CM

## 2016-12-24 DIAGNOSIS — N289 Disorder of kidney and ureter, unspecified: Secondary | ICD-10-CM

## 2016-12-24 DIAGNOSIS — E079 Disorder of thyroid, unspecified: Secondary | ICD-10-CM

## 2016-12-24 NOTE — Telephone Encounter (Signed)
Mailbox is full.

## 2016-12-24 NOTE — Telephone Encounter (Signed)
I need to talk to this pt about his MRI.  Writing this message to you as early in the morning so I don't forget.

## 2016-12-24 NOTE — Telephone Encounter (Signed)
Lesion on kidney on MI.  Refer to nephrology  Lesion on thyroid on CT, order [place]

## 2017-01-06 ENCOUNTER — Ambulatory Visit: Admission: RE | Admit: 2017-01-06 | Payer: Medicare HMO | Source: Ambulatory Visit

## 2017-01-17 ENCOUNTER — Ambulatory Visit: Payer: Medicare HMO | Admitting: Urology

## 2017-01-17 ENCOUNTER — Encounter: Payer: Self-pay | Admitting: Urology

## 2017-01-17 DIAGNOSIS — N2889 Other specified disorders of kidney and ureter: Secondary | ICD-10-CM | POA: Insufficient documentation

## 2017-01-17 NOTE — Progress Notes (Deleted)
01/17/2017 10:09 AM   Tyler Cohen 06-Sep-1955 [id number]  Referring provider: Kathrine Haddock, NP [address], [place] [address]  CC: New Patient, Small Left Renal Mass  HPI:  1 - Small LEFT Renal Mass - 1.3cm left mid anterior solid enhancing renal mass incidental on abd MRI for small liver hemangiomas. Mass is 1.3cm, about 70% endophytc, solid and enhancing. 1 artery / 1 vein (retroaortic) left renovascular anatomy.  PMH sig for OVZCHYIF.   Today "Tyler Cohen" is seen as new patient for small left renal mass.    PMH: Past Medical History:  Diagnosis Date  . Arthritis   . Heart murmur   . Hyperlipidemia   . Macrocytosis   . Thyroid disease   . Tobacco abuse     Surgical History: Past Surgical History:  Procedure Laterality Date  . COLONOSCOPY WITH PROPOFOL N/A 12/01/2015   Procedure: COLONOSCOPY WITH PROPOFOL;  Surgeon: Lucilla Lame, MD;  Location: [facility];  Service: Endoscopy;  Laterality: N/A;  . POLYPECTOMY  12/01/2015   Procedure: POLYPECTOMY;  Surgeon: Lucilla Lame, MD;  Location: [facility];  Service: Endoscopy;;    Home Medications:  Allergies as of 01/17/2017   No Known Allergies     Medication List       Accurate as of 01/17/17 10:09 AM. Always use your most recent med list.          aspirin 81 MG tablet Take 81 mg by mouth daily.   atorvastatin 20 MG tablet Commonly known as:  LIPITOR TAKE 1 TABLET BY MOUTH AT BEDTIME   b complex vitamins tablet Take 1 tablet by mouth daily.   clotrimazole-betamethasone cream Commonly known as:  LOTRISONE Apply topically 2 (two) times daily.       Allergies: No Known Allergies  Family History: Family History  Problem Relation Age of Onset  . Hypertension Mother   . Diabetes Mother        lost both legs  . Kidney disease Mother   . Hypertension Father   . Emphysema Father   . Sickle cell trait Father   . Hypertension Brother   . Hyperlipidemia Brother   . Sickle cell trait  Sister     Social History:  reports that he has been smoking Cigarettes.  He has a 10.50 pack-year smoking history. He has never used smokeless tobacco. He reports that he uses drugs, including Marijuana. He reports that he does not drink alcohol.      Review of Systems  Gastrointestinal (upper)  : Negative for upper GI symptoms  Gastrointestinal (lower) : Negative for lower GI symptoms  Constitutional : Negative for symptoms  Skin: Negative for skin symptoms  Eyes: Negative for eye symptoms  Ear/Nose/Throat : Negative for Ear/Nose/Throat symptoms  Hematologic/Lymphatic: Negative for Hematologic/Lymphatic symptoms  Cardiovascular : Negative for cardiovascular symptoms  Respiratory : Negative for respiratory symptoms  Endocrine: Negative for endocrine symptoms  Musculoskeletal: Negative for musculoskeletal symptoms  Neurological: Negative for neurological symptoms  Psychologic: Negative for psychiatric symptoms   Physical Exam: There were no vitals taken for this visit.  Constitutional:  Alert and oriented, No acute distress. HEENT: [place] AT, moist mucus membranes.  Trachea midline, no masses. Cardiovascular: No clubbing, cyanosis, or edema. Respiratory: Normal respiratory effort, no increased work of breathing. GI: Abdomen is soft, nontender, nondistended, no abdominal masses GU: No CVA tenderness. Phallus straight. No palpable scrotal / testes masses.  Skin: No rashes, bruises or suspicious lesions. Lymph: No cervical or inguinal adenopathy. Neurologic:  Grossly intact, no focal deficits, moving all 4 extremities. Psychiatric: Normal mood and affect.  Laboratory Data: Lab Results  Component Value Date   WBC 7.1 11/02/2016   HGB 15.0 11/02/2016   HCT 44.1 11/02/2016   MCV 98 (H) 11/02/2016   PLT 192 11/02/2016    Lab Results  Component Value Date   CREATININE 0.94 11/02/2016    No results found for: PSA1  No results found for:  TESTOSTERONE  No results found for: HGBA1C  Urinalysis No results found for: SPECGRAV, PHUR, COLORU, APPEARANCEUR, LEUKOCYTESUR, PROTEINUR, GLUCOSEU, KETONESU, RBCU, BILIRUBINUR, UUROB, NITRITE  No results found for: LABMICR, WBCUA, RBCUA, LABEPIT, MUCUS, BACTERIA  Pertinent Imaging: abcominal MRI as per HPI  Assessment & Plan:    1 - Small LEFT Renal Mass - DDX benign and malignant lesions discussed as well as management options of surveillance (completely reasonable given small size, consider intervention if clearly enlarging or becomes >2.5cm), ablation, and surgery in order of increasing agressiveness. He does have some CV comorbidity. I frankl recommended surveillance with low threshold for partial nephrectomy (favored over ablation given anterior location and relatively endophytic nature) if clearly progressive.  RTC 19mos with abd CT with / without, then yearly if stable.   Alexis Frock, [facility] [address] [place], [place] [address] [phone]

## 2017-01-25 ENCOUNTER — Encounter: Payer: Self-pay | Admitting: Urology

## 2017-01-25 ENCOUNTER — Ambulatory Visit (INDEPENDENT_AMBULATORY_CARE_PROVIDER_SITE_OTHER): Payer: Medicare HMO | Admitting: Urology

## 2017-01-25 VITALS — BP 115/65 | HR 83 | Ht 73.0 in | Wt 165.0 lb

## 2017-01-25 DIAGNOSIS — Z125 Encounter for screening for malignant neoplasm of prostate: Secondary | ICD-10-CM

## 2017-01-25 DIAGNOSIS — N2889 Other specified disorders of kidney and ureter: Secondary | ICD-10-CM

## 2017-01-26 LAB — PSA: Prostate Specific Ag, Serum: 1.1 ng/mL (ref 0.0–4.0)

## 2017-01-26 NOTE — Progress Notes (Signed)
01/25/2017 7:10 AM   Tyler Cohen 01-09-1956 [id number]  Referring provider: Kathrine Haddock, NP [address], [place] [address]  Chief Complaint  Patient presents with  . Kidney Lesion    New Patient    HPI: Tyler Cohen is a 61 year old male seen in consultation at the request of Kathrine Haddock for evaluation of an incidentally discovered left renal mass.  A recent CT of the chest showed a questionable liver lesion on lower cuts and an abdominal MRI was performed.  The liver findings were consistent with benign hemangiomas however he was incidentally discovered to have a 13 mm enhancing mass on the lateral aspect midpole left kidney.  He denies previous history of urologic problems.  He has mild lower urinary tract symptoms including urinary frequency and nocturia x2 which are not bothersome.  He denies dysuria or gross hematuria.  Denies flank, abdominal, pelvic or scrotal pain.   PMH: Past Medical History:  Diagnosis Date  . Arthritis   . Heart murmur   . Hyperlipidemia   . Macrocytosis   . Thyroid disease   . Tobacco abuse     Surgical History: Past Surgical History:  Procedure Laterality Date  . COLONOSCOPY WITH PROPOFOL N/A 12/01/2015   Procedure: COLONOSCOPY WITH PROPOFOL;  Surgeon: Lucilla Lame, MD;  Location: [place] [facility];  Service: Endoscopy;  Laterality: N/A;  . POLYPECTOMY  12/01/2015   Procedure: POLYPECTOMY;  Surgeon: Lucilla Lame, MD;  Location: [facility];  Service: Endoscopy;;    Home Medications:  Allergies as of 01/25/2017   No Known Allergies     Medication List       Accurate as of 01/25/17 11:59 PM. Always use your most recent med list.          aspirin 81 MG tablet Take 81 mg by mouth daily.   atorvastatin 20 MG tablet Commonly known as:  LIPITOR TAKE 1 TABLET BY MOUTH AT BEDTIME   b complex vitamins tablet Take 1 tablet by mouth daily.   clotrimazole-betamethasone cream Commonly known as:  LOTRISONE Apply topically  2 (two) times daily.       Allergies: No Known Allergies  Family History: Family History  Problem Relation Age of Onset  . Hypertension Mother   . Diabetes Mother        lost both legs  . Kidney disease Mother   . Hypertension Father   . Emphysema Father   . Sickle cell trait Father   . Hypertension Brother   . Hyperlipidemia Brother   . Sickle cell trait Sister     Social History:  reports that he has been smoking Cigarettes.  He has a 10.50 pack-year smoking history. He has never used smokeless tobacco. He reports that he uses drugs, including Marijuana. He reports that he does not drink alcohol.  ROS: UROLOGY Frequent Urination?: Yes Hard to postpone urination?: No Burning/pain with urination?: No Get up at night to urinate?: Yes Leakage of urine?: No Urine stream starts and stops?: No Trouble starting stream?: No Do you have to strain to urinate?: No Blood in urine?: No Urinary tract infection?: No Sexually transmitted disease?: No Injury to kidneys or bladder?: No Painful intercourse?: No Weak stream?: No Erection problems?: No Penile pain?: No  Gastrointestinal Nausea?: No Vomiting?: No Indigestion/heartburn?: No Diarrhea?: No Constipation?: No  Constitutional Fever: No Night sweats?: No Weight loss?: No Fatigue?: No  Skin Skin rash/lesions?: No Itching?: No  Eyes Blurred vision?: No Double vision?: No  Ears/Nose/Throat Sore throat?:  No Sinus problems?: Yes  Hematologic/Lymphatic Swollen glands?: No Easy bruising?: No  Cardiovascular Leg swelling?: No Chest pain?: No  Respiratory Cough?: No Shortness of breath?: No  Endocrine Excessive thirst?: No  Musculoskeletal Back pain?: No Joint pain?: No  Neurological Headaches?: No Dizziness?: No  Psychologic Depression?: No Anxiety?: No  Physical Exam: BP 115/65   Pulse 83   Ht 6\' 1"  (1.854 m)   Wt 165 lb (74.8 kg)   BMI 21.77 kg/m   Constitutional:  Alert and  oriented, No acute distress. HEENT: [place] AT, moist mucus membranes.  Trachea midline, no masses. Cardiovascular: No clubbing, cyanosis, or edema. Respiratory: Normal respiratory effort, no increased work of breathing. GI: Abdomen is soft, nontender, nondistended, no abdominal masses GU: No CVA tenderness.  Skin: No rashes, bruises or suspicious lesions. Lymph: No cervical or inguinal adenopathy. Neurologic: Grossly intact, no focal deficits, moving all 4 extremities. Psychiatric: Normal mood and affect.  Laboratory Data: Lab Results  Component Value Date   WBC 7.1 11/02/2016   HGB 15.0 11/02/2016   HCT 44.1 11/02/2016   MCV 98 (H) 11/02/2016   PLT 192 11/02/2016    Lab Results  Component Value Date   CREATININE 0.94 11/02/2016    Lab Results  Component Value Date   PSA1 1.1 01/25/2017    Pertinent Imaging: MRI images were personally reviewed.  CLINICAL DATA:  Liver lesion on CT  EXAM: MRI ABDOMEN WITHOUT AND WITH CONTRAST  TECHNIQUE: Multiplanar multisequence MR imaging of the abdomen was performed both before and after the administration of intravenous contrast.  CONTRAST:  58mL MULTIHANCE GADOBENATE DIMEGLUMINE 529 MG/ML IV SOLN  COMPARISON:  Partial comparison to low-dose lung cancer screening CT chest dated 11/09/2016  FINDINGS: Motion degraded images.  Lower chest: Lung bases are clear.  Hepatobiliary: Six dominant T2 hyperintense lesions scattered in both lobes of the liver, including a 15 mm lesion in the posterior right hepatic dome (series 5/image 7) and a 21 mm lesion in the lateral segment left hepatic lobe (series 5/image 9), with associated peripheral nodular discontinuous enhancement characteristic of benign hemangiomas following contrast administration. Additional scattered subcentimeter lesions which may reflect cysts or hemangiomas.  No suspicious hepatic lesions.  Gallbladder is unremarkable. No intrahepatic extrahepatic  ductal dilatation.  Pancreas:  Within normal limits.  Spleen:  Within normal limits.  Adrenals/Urinary Tract:  Adrenal glands are within normal limits.  Scattered subcentimeter bilateral renal cysts. 13 mm lesion with suspected enhancement along the lateral left upper kidney (series 18/ image 29), worrisome for solid renal neoplasm. No hydronephrosis.  Stomach/Bowel: Stomach is within normal limits.  Visualized bowel is unremarkable.  Vascular/Lymphatic:  No evidence of abdominal aortic aneurysm.  Single left renal artery and vein. No renal vein invasion. Retroaortic left renal vein.  No suspicious abdominal lymphadenopathy.  Other:  No abdominal ascites.  Musculoskeletal: No focal osseous lesions.  IMPRESSION: Multiple benign hemangiomas in the liver, measuring up to 2.1 cm in the lateral segment left hepatic lobe, as above. No suspicious hepatic lesions.  **An incidental finding of potential clinical significance has been found. 1.3 cm enhancing lesion in the lateral left upper kidney, worrisome for solid renal neoplasm. Urologic consultation is suggested. Single left renal artery and vein. Retroaortic left renal vein. No renal vein invasion.**  These results will be called to the ordering clinician or representative by the Radiologist Assistant, and communication documented in the PACS or zVision Dashboard.   Electronically Signed   By: Julian Hy M.D.   On:  12/10/2016 10:58  Assessment & Plan:    1. Renal mass, left MRI findings were discussed in detail with Mr. People.  We discussed the likelihood at this may represent a small renal cell carcinoma.  Management options were discussed including surveillance; partial nephrectomy; percutaneous ablation by interventional radiology and renal mass biopsy for risk stratification. He was undecided at the time of our visit however is leaning toward surveillance.  Will tentatively schedule a  renal mass protocol CT January 2019.  If he elects definitive treatment or biopsy he indicated he will call back.  - CT Abd Wo & W Cm; Future  2. Screening PSA (prostate specific antigen) Chart review negative for a prior PSA.  The natural history of prostate cancer and ongoing controversy regarding screening and potential treatment outcomes of prostate cancer have been discussed with the patient. The meaning of a false positive PSA and a false negative PSA has been discussed. He indicates understanding of the limitations of this screening test and wishes to proceed with screening PSA testing.  - PSA   Return in about 3 months (around 04/27/2017) for CT results.  Abbie Sons, [facility] [address] [place], [place] [address] [phone]

## 2017-01-27 ENCOUNTER — Telehealth: Payer: Self-pay | Admitting: *Deleted

## 2017-01-27 NOTE — Telephone Encounter (Signed)
Spoke with patient and gave results. 

## 2017-01-27 NOTE — Telephone Encounter (Signed)
-----   Message from Abbie Sons, MD sent at 01/27/2017  1:32 PM EDT ----- PSA was normal at 1.1

## 2017-03-18 NOTE — Telephone Encounter (Signed)
Pt has several thinks he needs to f/u with Urology for a lesion on kidny noted on MRI

## 2017-03-18 NOTE — Telephone Encounter (Signed)
Talked with pt.  F/u done with Dr. Bernardo Heater.  No [place] of thyroid has been done.

## 2017-03-21 NOTE — Telephone Encounter (Signed)
Patient had an U/S scheduled for his thyroid but no showed to appointment.

## 2017-03-21 NOTE — Telephone Encounter (Signed)
He can call outpatient imaging center to R/S if needed.

## 2017-03-22 NOTE — Telephone Encounter (Signed)
LVM on patient's personalized VM to R/S his appointment and the number to [facility] (where his appt originally was)

## 2017-04-05 DIAGNOSIS — E278 Other specified disorders of adrenal gland: Secondary | ICD-10-CM

## 2017-04-05 DIAGNOSIS — E0590 Thyrotoxicosis, unspecified without thyrotoxic crisis or storm: Secondary | ICD-10-CM

## 2017-04-05 DIAGNOSIS — E041 Nontoxic single thyroid nodule: Secondary | ICD-10-CM

## 2017-04-05 HISTORY — DX: Thyrotoxicosis, unspecified without thyrotoxic crisis or storm: E05.90

## 2017-04-05 HISTORY — DX: Nontoxic single thyroid nodule: E04.1

## 2017-04-05 HISTORY — DX: Other specified disorders of adrenal gland: E27.8

## 2017-04-22 ENCOUNTER — Ambulatory Visit
Admission: RE | Admit: 2017-04-22 | Discharge: 2017-04-22 | Disposition: A | Discharge status: Home / Self Care | Payer: Medicare HMO | Source: Ambulatory Visit | Attending: Urology | Admitting: Urology

## 2017-04-22 DIAGNOSIS — N2889 Other specified disorders of kidney and ureter: Secondary | ICD-10-CM | POA: Diagnosis not present

## 2017-04-22 DIAGNOSIS — E279 Disorder of adrenal gland, unspecified: Secondary | ICD-10-CM | POA: Insufficient documentation

## 2017-04-22 LAB — POCT I-STAT CREATININE: CREATININE: 0.9 mg/dL (ref 0.61–1.24)

## 2017-04-22 MED ORDER — IOPAMIDOL (ISOVUE-370) INJECTION 76%: 100.0000 mL | Freq: Once | INTRAVENOUS | Status: DC | PRN

## 2017-04-27 ENCOUNTER — Encounter: Payer: Self-pay | Admitting: Urology

## 2017-04-27 ENCOUNTER — Ambulatory Visit: Payer: Medicare HMO | Admitting: Urology

## 2017-04-27 VITALS — BP 129/80 | HR 109 | Ht 73.0 in | Wt 161.9 lb

## 2017-04-27 DIAGNOSIS — N2889 Other specified disorders of kidney and ureter: Secondary | ICD-10-CM | POA: Diagnosis not present

## 2017-04-27 DIAGNOSIS — R350 Frequency of micturition: Secondary | ICD-10-CM

## 2017-04-27 LAB — URINALYSIS, COMPLETE
BILIRUBIN UA: NEGATIVE
GLUCOSE, UA: NEGATIVE
KETONES UA: NEGATIVE
Nitrite, UA: NEGATIVE
PROTEIN UA: NEGATIVE
RBC UA: NEGATIVE
SPEC GRAV UA: 1.025 (ref 1.005–1.030)
Urobilinogen, Ur: 0.2 mg/dL (ref 0.2–1.0)
pH, UA: 5.5 (ref 5.0–7.5)

## 2017-04-27 LAB — MICROSCOPIC EXAMINATION

## 2017-04-27 NOTE — Progress Notes (Signed)
04/27/2017 3:29 PM   Tyler Cohen 08-31-1955 [id number]  Referring provider: Kathrine Haddock, NP [address], [place] [address]  Chief Complaint  Patient presents with  . Follow-up    CT results    HPI: 62 year old male presents for follow-up of an incidentally discovered left renal mass measuring 13 mm.  He denies flank/abdominal pain or gross hematuria.  Follow-up renal mass protocol CT was performed on 04/22/2017 which showed a 14 x 16 mm enhancing lesion with peripheral coarse calcification in the left kidney.  He was also noted to have a 2.5 x 2.0 cm left adrenal mass consistent with an adrenal adenoma.   PMH: Past Medical History:  Diagnosis Date  . Arthritis   . Heart murmur   . Hyperlipidemia   . Macrocytosis   . Thyroid disease   . Tobacco abuse     Surgical History: Past Surgical History:  Procedure Laterality Date  . COLONOSCOPY WITH PROPOFOL N/A 12/01/2015   Procedure: COLONOSCOPY WITH PROPOFOL;  Surgeon: Lucilla Lame, MD;  Location: [facility];  Service: Endoscopy;  Laterality: N/A;  . POLYPECTOMY  12/01/2015   Procedure: POLYPECTOMY;  Surgeon: Lucilla Lame, MD;  Location: [facility];  Service: Endoscopy;;    Home Medications:  Allergies as of 04/27/2017   No Known Allergies     Medication List        Accurate as of 04/27/17  3:29 PM. Always use your most recent med list.          aspirin 81 MG tablet Take 81 mg by mouth daily.   atorvastatin 20 MG tablet Commonly known as:  LIPITOR TAKE 1 TABLET BY MOUTH AT BEDTIME   b complex vitamins tablet Take 1 tablet by mouth daily.   clotrimazole-betamethasone cream Commonly known as:  LOTRISONE Apply topically 2 (two) times daily.       Allergies: No Known Allergies  Family History: Family History  Problem Relation Age of Onset  . Hypertension Mother   . Diabetes Mother        lost both legs  . Kidney disease Mother   . Hypertension Father   . Emphysema Father   .  Sickle cell trait Father   . Hypertension Brother   . Hyperlipidemia Brother   . Sickle cell trait Sister     Social History:  reports that he has been smoking cigarettes.  He has a 10.50 pack-year smoking history. he has never used smokeless tobacco. He reports that he uses drugs. Drug: Marijuana. He reports that he does not drink alcohol.  ROS: UROLOGY Frequent Urination?: No Hard to postpone urination?: No Burning/pain with urination?: No Get up at night to urinate?: No Leakage of urine?: No Urine stream starts and stops?: No Trouble starting stream?: No Do you have to strain to urinate?: No Blood in urine?: No Urinary tract infection?: No Sexually transmitted disease?: No Injury to kidneys or bladder?: No Painful intercourse?: No Weak stream?: No Erection problems?: No Penile pain?: No  Gastrointestinal Nausea?: No Vomiting?: No Indigestion/heartburn?: No Diarrhea?: No Constipation?: No  Constitutional Fever: No Night sweats?: No Weight loss?: No Fatigue?: No  Skin Skin rash/lesions?: No Itching?: No  Eyes Blurred vision?: No Double vision?: No  Ears/Nose/Throat Sore throat?: No Sinus problems?: No  Hematologic/Lymphatic Swollen glands?: No Easy bruising?: No  Cardiovascular Leg swelling?: No Chest pain?: No  Respiratory Cough?: No Shortness of breath?: No  Endocrine Excessive thirst?: No  Musculoskeletal Back pain?: No Joint pain?: No  Neurological  Headaches?: No Dizziness?: No  Psychologic Depression?: No Anxiety?: No  Physical Exam: BP 129/80 (BP Location: Left Arm, Patient Position: Sitting, Cohen Size: Normal)   Pulse (!) 109   Ht 6\' 1"  (1.854 m)   Wt 161 lb 14.4 oz (73.4 kg)   BMI 21.36 kg/m   Constitutional:  Alert and oriented, No acute distress. HEENT: [place] AT, moist mucus membranes.  Trachea midline, no masses. Cardiovascular: No clubbing, cyanosis, or edema. Respiratory: Normal respiratory effort, no increased work  of breathing. GI: Abdomen is soft, nontender, nondistended, no abdominal masses GU: No CVA tenderness. Skin: No rashes, bruises or suspicious lesions. Lymph: No cervical or inguinal adenopathy. Neurologic: Grossly intact, no focal deficits, moving all 4 extremities. Psychiatric: Normal mood and affect.  Laboratory Data: Lab Results  Component Value Date   WBC 7.1 11/02/2016   HGB 15.0 11/02/2016   HCT 44.1 11/02/2016   MCV 98 (H) 11/02/2016   PLT 192 11/02/2016    Lab Results  Component Value Date   CREATININE 0.90 04/22/2017    Lab Results  Component Value Date   PSA1 1.1 01/25/2017    Urinalysis Lab Results  Component Value Date   SPECGRAV 1.025 04/27/2017   PHUR 5.5 04/27/2017   COLORU Yellow 04/27/2017   APPEARANCEUR Clear 04/27/2017   LEUKOCYTESUR 2+ (A) 04/27/2017   PROTEINUR Negative 04/27/2017   GLUCOSEU Negative 04/27/2017   KETONESU Negative 04/27/2017   RBCU Negative 04/27/2017   BILIRUBINUR Negative 04/27/2017   UUROB 0.2 04/27/2017   NITRITE Negative 04/27/2017    Lab Results  Component Value Date   LABMICR See below: 04/27/2017   WBCUA 6-10 (A) 04/27/2017   RBCUA 0-2 04/27/2017   LABEPIT 0-10 04/27/2017   MUCUS Present (A) 04/27/2017   BACTERIA Moderate (A) 04/27/2017    Pertinent Imaging: CT reviewed.  Assessment & Plan:   Small enhancing left renal mass which is stable.  I again discussed the likelihood this may represent a small renal cell carcinoma.  I discussed management options including continued surveillance, renal mass biopsy for risk stratification, surgical removal as well as CT-guided percutaneous treatments including cryoablation/RFA.  He is not sure how he would like to proceed at this point and states he would like to think this over.   He was also incidentally noted to have pyuria and Trichomonas on urinalysis.  Rx azithromycin was sent to his pharmacy.  Also discussed the need to contact his sexual partner.  Abbie Sons, [place] [address] [place], [place] [address] [phone]

## 2017-04-28 MED ORDER — AZITHROMYCIN 1 G PO PACK: 1.0000 g | PACK | Freq: Once | ORAL | 0 refills | Status: AC

## 2017-05-04 ENCOUNTER — Encounter: Payer: Self-pay | Admitting: Unknown Physician Specialty

## 2017-05-04 ENCOUNTER — Ambulatory Visit (INDEPENDENT_AMBULATORY_CARE_PROVIDER_SITE_OTHER): Payer: Medicare HMO | Admitting: Unknown Physician Specialty

## 2017-05-04 VITALS — BP 102/68 | HR 78 | Temp 98.2°F

## 2017-05-04 DIAGNOSIS — Z23 Encounter for immunization: Secondary | ICD-10-CM | POA: Diagnosis not present

## 2017-05-04 DIAGNOSIS — E278 Other specified disorders of adrenal gland: Secondary | ICD-10-CM

## 2017-05-04 DIAGNOSIS — E279 Disorder of adrenal gland, unspecified: Secondary | ICD-10-CM | POA: Diagnosis not present

## 2017-05-04 DIAGNOSIS — N2889 Other specified disorders of kidney and ureter: Secondary | ICD-10-CM | POA: Diagnosis not present

## 2017-05-04 DIAGNOSIS — E7800 Pure hypercholesterolemia, unspecified: Secondary | ICD-10-CM

## 2017-05-04 DIAGNOSIS — K769 Liver disease, unspecified: Secondary | ICD-10-CM

## 2017-05-04 DIAGNOSIS — E041 Nontoxic single thyroid nodule: Secondary | ICD-10-CM | POA: Insufficient documentation

## 2017-05-04 NOTE — Assessment & Plan Note (Signed)
Following with Urology.  Serial CTs

## 2017-05-04 NOTE — Progress Notes (Signed)
BP 102/68 (BP Location: Left Arm, Cohen Size: Normal)   Pulse 78   Temp 98.2 F (36.8 C) (Oral)   SpO2 98%    Subjective:    Patient ID: Tyler Cohen, male    DOB: Mar 09, 1956, 62 y.o.   MRN: [id number]  HPI: Tyler Cohen is a 62 y.o. male  Chief Complaint  Patient presents with  . Hyperlipidemia   Pt following up with ENT for thyroid cyst and Uroloy for a  "spot" on his kidneys.  These are both incidental findings from low dose CT  Hyperlipidemia Using medications without problems: No Muscle aches  Diet compliance: Eats healthy Exercise:Riding bicycle  Relevant past medical, surgical, family and social history reviewed and updated as indicated. Interim medical history since our last visit reviewed. Allergies and medications reviewed and updated.  Review of Systems  Constitutional: Negative.   HENT: Negative.   Eyes: Negative.   Respiratory: Negative.   Cardiovascular: Negative.   Gastrointestinal: Negative.   Endocrine: Negative.   Genitourinary: Negative.   Skin: Negative.   Allergic/Immunologic: Negative.   Neurological: Negative.   Hematological: Negative.   Psychiatric/Behavioral: Negative.     Per HPI unless specifically indicated above     Objective:    BP 102/68 (BP Location: Left Arm, Cohen Size: Normal)   Pulse 78   Temp 98.2 F (36.8 C) (Oral)   SpO2 98%   Wt Readings from Last 3 Encounters:  04/27/17 161 lb 14.4 oz (73.4 kg)  01/25/17 165 lb (74.8 kg)  11/29/16 166 lb (75.3 kg)    Physical Exam  Constitutional: He is oriented to person, place, and time. He appears well-developed and well-nourished. No distress.  HENT:  Head: Normocephalic and atraumatic.  Eyes: Conjunctivae and lids are normal. Right eye exhibits no discharge. Left eye exhibits no discharge. No scleral icterus.  Neck: Normal range of motion. Neck supple. No JVD present. Carotid bruit is not present.  Cardiovascular: Normal rate, regular rhythm and normal heart sounds.    Pulmonary/Chest: Effort normal and breath sounds normal. No respiratory distress.  Abdominal: Normal appearance. There is no splenomegaly or hepatomegaly.  Musculoskeletal: Normal range of motion.  Neurological: He is alert and oriented to person, place, and time.  Skin: Skin is warm, dry and intact. No rash noted. No pallor.  Psychiatric: He has a normal mood and affect. His behavior is normal. Judgment and thought content normal.    Results for orders placed or performed in visit on 04/27/17  Microscopic Examination  Result Value Ref Range   WBC, UA 6-10 (A) 0 - 5 /hpf   RBC, UA 0-2 0 - 2 /hpf   Epithelial Cells (non renal) 0-10 0 - 10 /hpf   Mucus, UA Present (A) Not Estab.   Bacteria, UA Moderate (A) None seen/Few   Trichomonas, UA Present (A) None seen  Urinalysis, Complete  Result Value Ref Range   Specific Gravity, UA 1.025 1.005 - 1.030   pH, UA 5.5 5.0 - 7.5   Color, UA Yellow Yellow   Appearance Ur Clear Clear   Leukocytes, UA 2+ (A) Negative   Protein, UA Negative Negative/Trace   Glucose, UA Negative Negative   Ketones, UA Negative Negative   RBC, UA Negative Negative   Bilirubin, UA Negative Negative   Urobilinogen, Ur 0.2 0.2 - 1.0 mg/dL   Nitrite, UA Negative Negative   Microscopic Examination See below:       Assessment & Plan:   Problem List Items  Addressed This Visit      Unprioritized   Adrenal mass [facility])    Serial CTs through Urology      Hyperlipidemia    Stable, continue present medications.        Left thyroid nodule    Following with Dr. Tami Ribas.  DC [place] he has ordered through here      Liver lesion    Images consistent with Hemangiomas      Renal mass    Following with Urology.  Serial CTs       Other Visit Diagnoses    Need for influenza vaccination    -  Primary   Relevant Orders   Flu Vaccine QUAD 36+ mos IM (Completed)       Follow up plan: Return in about 6 months (around 11/01/2017) for physical.

## 2017-05-04 NOTE — Assessment & Plan Note (Signed)
Serial CTs through Urology

## 2017-05-04 NOTE — Patient Instructions (Addendum)

## 2017-05-04 NOTE — Assessment & Plan Note (Signed)
Images consistent with Hemangiomas

## 2017-05-04 NOTE — Assessment & Plan Note (Signed)
Following with Dr. Tami Ribas.  DC [place] he has ordered through here

## 2017-05-04 NOTE — Assessment & Plan Note (Signed)
Stable, continue present medications.   

## 2017-06-09 DIAGNOSIS — Z720 Tobacco use: Secondary | ICD-10-CM | POA: Diagnosis not present

## 2017-06-09 DIAGNOSIS — E785 Hyperlipidemia, unspecified: Secondary | ICD-10-CM | POA: Diagnosis not present

## 2017-06-09 DIAGNOSIS — Z7982 Long term (current) use of aspirin: Secondary | ICD-10-CM | POA: Diagnosis not present

## 2017-06-10 DIAGNOSIS — M1712 Unilateral primary osteoarthritis, left knee: Secondary | ICD-10-CM | POA: Diagnosis not present

## 2017-06-10 DIAGNOSIS — M1711 Unilateral primary osteoarthritis, right knee: Secondary | ICD-10-CM | POA: Diagnosis not present

## 2017-06-10 DIAGNOSIS — M5136 Other intervertebral disc degeneration, lumbar region: Secondary | ICD-10-CM | POA: Diagnosis not present

## 2017-06-10 DIAGNOSIS — M545 Low back pain: Secondary | ICD-10-CM | POA: Diagnosis not present

## 2017-06-22 ENCOUNTER — Telehealth: Payer: Self-pay | Admitting: Urology

## 2017-06-22 NOTE — Telephone Encounter (Signed)
I did And I will try him again  Sharyn Lull

## 2017-06-22 NOTE — Telephone Encounter (Signed)
error 

## 2017-06-22 NOTE — Telephone Encounter (Signed)
-----   Message from Abbie Sons, MD sent at 06/22/2017  7:37 AM EDT ----- Please schedule follow-up appointment for renal mass next month.

## 2017-06-22 NOTE — Telephone Encounter (Signed)
Can we send a letter? 

## 2017-06-22 NOTE — Telephone Encounter (Signed)
Could not leave a message MB was full Mailed appointment Sharyn Lull

## 2017-06-22 NOTE — Telephone Encounter (Signed)
Just spoke with the patient he is aware  Sharyn Lull

## 2017-06-25 DIAGNOSIS — M1711 Unilateral primary osteoarthritis, right knee: Secondary | ICD-10-CM | POA: Diagnosis not present

## 2017-06-25 DIAGNOSIS — M1712 Unilateral primary osteoarthritis, left knee: Secondary | ICD-10-CM | POA: Diagnosis not present

## 2017-06-25 DIAGNOSIS — M545 Low back pain: Secondary | ICD-10-CM | POA: Diagnosis not present

## 2017-06-25 DIAGNOSIS — M5136 Other intervertebral disc degeneration, lumbar region: Secondary | ICD-10-CM | POA: Diagnosis not present

## 2017-07-25 ENCOUNTER — Encounter: Payer: Self-pay | Admitting: Urology

## 2017-07-25 ENCOUNTER — Ambulatory Visit (INDEPENDENT_AMBULATORY_CARE_PROVIDER_SITE_OTHER): Payer: Medicare HMO | Admitting: Urology

## 2017-07-25 VITALS — BP 122/75 | HR 76 | Ht 73.0 in | Wt 165.0 lb

## 2017-07-25 DIAGNOSIS — E278 Other specified disorders of adrenal gland: Secondary | ICD-10-CM

## 2017-07-25 DIAGNOSIS — N2889 Other specified disorders of kidney and ureter: Secondary | ICD-10-CM | POA: Diagnosis not present

## 2017-07-25 DIAGNOSIS — E279 Disorder of adrenal gland, unspecified: Secondary | ICD-10-CM

## 2017-07-25 NOTE — Progress Notes (Signed)
07/25/2017 1:01 PM   Tyler Cohen January 10, 1956 [id number]  Referring provider: Kathrine Haddock, NP [address], [place] [address]  Chief Complaint  Patient presents with  . Renal Mass    Follow up    HPI: 62 year old male presents for follow-up of a 16 mm enhancing left renal mass.  He was last seen on 04/27/2017 and management options were discussed and he wanted to think over these options.  He was contacted for a follow-up visit.  He denies flank or abdominal pain.  He was also found to have a 2.5 cm adrenal mass most likely felt to be an adenoma.  An adrenal protocol study was recommended.  He has no bothersome lower urinary tract symptoms.   PMH: Past Medical History:  Diagnosis Date  . Arthritis   . Heart murmur   . Hyperlipidemia   . Macrocytosis   . Thyroid disease   . Tobacco abuse     Surgical History: Past Surgical History:  Procedure Laterality Date  . COLONOSCOPY WITH PROPOFOL N/A 12/01/2015   Procedure: COLONOSCOPY WITH PROPOFOL;  Surgeon: Lucilla Lame, MD;  Location: [facility];  Service: Endoscopy;  Laterality: N/A;  . POLYPECTOMY  12/01/2015   Procedure: POLYPECTOMY;  Surgeon: Lucilla Lame, MD;  Location: [facility];  Service: Endoscopy;;    Home Medications:  Allergies as of 07/25/2017   No Known Allergies     Medication List        Accurate as of 07/25/17  1:01 PM. Always use your most recent med list.          aspirin 81 MG tablet Take 81 mg by mouth daily.   atorvastatin 20 MG tablet Commonly known as:  LIPITOR TAKE 1 TABLET BY MOUTH AT BEDTIME   b complex vitamins tablet Take 1 tablet by mouth daily.   clotrimazole-betamethasone cream Commonly known as:  LOTRISONE Apply topically 2 (two) times daily.       Allergies: No Known Allergies  Family History: Family History  Problem Relation Age of Onset  . Hypertension Mother   . Diabetes Mother        lost both legs  . Kidney disease Mother   . Hypertension  Father   . Emphysema Father   . Sickle cell trait Father   . Hypertension Brother   . Hyperlipidemia Brother   . Sickle cell trait Sister     Social History:  reports that he has been smoking cigarettes.  He has a 10.50 pack-year smoking history. He has never used smokeless tobacco. He reports that he has current or past drug history. Drug: Marijuana. He reports that he does not drink alcohol.  ROS: UROLOGY Frequent Urination?: No Hard to postpone urination?: No Burning/pain with urination?: No Get up at night to urinate?: No Leakage of urine?: No Urine stream starts and stops?: No Trouble starting stream?: No Do you have to strain to urinate?: No Blood in urine?: No Urinary tract infection?: No Sexually transmitted disease?: No Injury to kidneys or bladder?: No Painful intercourse?: No Weak stream?: No Erection problems?: No Penile pain?: No  Gastrointestinal Nausea?: No Vomiting?: No Indigestion/heartburn?: No Diarrhea?: No Constipation?: No  Constitutional Fever: No Night sweats?: No Weight loss?: No Fatigue?: No  Skin Skin rash/lesions?: No Itching?: No  Eyes Blurred vision?: No Double vision?: No  Ears/Nose/Throat Sore throat?: No Sinus problems?: No  Hematologic/Lymphatic Swollen glands?: No Easy bruising?: No  Cardiovascular Leg swelling?: No Chest pain?: No  Respiratory Cough?: No Shortness  of breath?: No  Endocrine Excessive thirst?: No  Musculoskeletal Back pain?: No Joint pain?: No  Neurological Headaches?: No Dizziness?: No  Psychologic Depression?: No Anxiety?: No  Physical Exam: BP 122/75   Pulse 76   Ht 6\' 1"  (1.854 m)   Wt 165 lb (74.8 kg)   BMI 21.77 kg/m    Constitutional:  Alert and oriented, No acute distress. HEENT: [place] AT, moist mucus membranes.  Trachea midline, no masses. Cardiovascular: No clubbing, cyanosis, or edema. Respiratory: Normal respiratory effort, no increased work of breathing. GI: Abdomen  is soft, nontender, nondistended, no abdominal masses GU: No CVA tenderness Lymph: No cervical or inguinal lymphadenopathy. Skin: No rashes, bruises or suspicious lesions. Neurologic: Grossly intact, no focal deficits, moving all 4 extremities. Psychiatric: Normal mood and affect.  Laboratory Data: Lab Results  Component Value Date   WBC 7.1 11/02/2016   HGB 15.0 11/02/2016   HCT 44.1 11/02/2016   MCV 98 (H) 11/02/2016   PLT 192 11/02/2016    Lab Results  Component Value Date   CREATININE 0.90 04/22/2017     Assessment & Plan:   62 year old male with a small, enhancing left renal mass.  Management options were again discussed.  Since it is been 3 months since his last imaging will schedule an MRI of the abdomen with and without contrast (adrenal protocol).   Abbie Sons, [place] [address], [place], [place] [address] [phone]

## 2017-08-06 DIAGNOSIS — S83421A Sprain of lateral collateral ligament of right knee, initial encounter: Secondary | ICD-10-CM | POA: Diagnosis not present

## 2017-08-07 DIAGNOSIS — S83421A Sprain of lateral collateral ligament of right knee, initial encounter: Secondary | ICD-10-CM | POA: Diagnosis not present

## 2017-08-08 ENCOUNTER — Ambulatory Visit
Admission: RE | Admit: 2017-08-08 | Discharge: 2017-08-08 | Disposition: A | Discharge status: Home / Self Care | Payer: Medicare HMO | Source: Ambulatory Visit | Attending: Urology | Admitting: Urology

## 2017-08-08 DIAGNOSIS — N2889 Other specified disorders of kidney and ureter: Secondary | ICD-10-CM | POA: Diagnosis not present

## 2017-08-08 DIAGNOSIS — D1803 Hemangioma of intra-abdominal structures: Secondary | ICD-10-CM | POA: Diagnosis not present

## 2017-08-08 DIAGNOSIS — K7689 Other specified diseases of liver: Secondary | ICD-10-CM | POA: Diagnosis not present

## 2017-08-08 DIAGNOSIS — E279 Disorder of adrenal gland, unspecified: Secondary | ICD-10-CM | POA: Insufficient documentation

## 2017-08-08 LAB — POCT I-STAT CREATININE: CREATININE: 0.9 mg/dL (ref 0.61–1.24)

## 2017-08-08 MED ORDER — GADOBENATE DIMEGLUMINE 529 MG/ML IV SOLN
15.0000 mL | Freq: Once | INTRAVENOUS | Status: AC | PRN
  Administered 2017-08-08: 15 mL via INTRAVENOUS

## 2017-08-12 ENCOUNTER — Telehealth: Payer: Self-pay | Admitting: Family Medicine

## 2017-08-12 NOTE — Telephone Encounter (Signed)
-----   Message from Abbie Sons, MD sent at 08/11/2017  7:04 PM EDT ----- MRI did show a stable small renal mass but is suspicious for a possible renal cancer.  Recommend follow-up visit to again review management options.

## 2017-08-12 NOTE — Telephone Encounter (Signed)
Patient notified and scheduled appointment.  

## 2017-09-08 ENCOUNTER — Ambulatory Visit (INDEPENDENT_AMBULATORY_CARE_PROVIDER_SITE_OTHER): Payer: Medicare HMO | Admitting: Urology

## 2017-09-08 ENCOUNTER — Encounter: Payer: Self-pay | Admitting: Urology

## 2017-09-08 DIAGNOSIS — N2889 Other specified disorders of kidney and ureter: Secondary | ICD-10-CM | POA: Diagnosis not present

## 2017-09-08 NOTE — Progress Notes (Signed)
09/08/2017 9:39 AM   Tyler Cohen Oct 28, 1955 [id number]  Referring provider: Kathrine Haddock, NP [address],  [address]  Chief Complaint  Patient presents with  . Results    MRI   Urologic problem list: -15 mm enhancing left renal mass  HPI: 62 year old male with a small enhancing left renal mass currently on surveillance.  He presents for follow-up and has no complaints.  Denies flank or abdominal pain.  He denies gross hematuria.  A follow-up MRI performed on 08/08/2017 shows a stable 15 mm enhancing left renal mass.  He also has a stable 17 mm left adrenal mass.   PMH: Past Medical History:  Diagnosis Date  . Arthritis   . Heart murmur   . Hyperlipidemia   . Macrocytosis   . Thyroid disease   . Tobacco abuse     Surgical History: Past Surgical History:  Procedure Laterality Date  . COLONOSCOPY WITH PROPOFOL N/A 12/01/2015   Procedure: COLONOSCOPY WITH PROPOFOL;  Surgeon: Lucilla Lame, MD;  Location: [facility];  Service: Endoscopy;  Laterality: N/A;  . POLYPECTOMY  12/01/2015   Procedure: POLYPECTOMY;  Surgeon: Lucilla Lame, MD;  Location: [facility];  Service: Endoscopy;;    Home Medications:  Allergies as of 09/08/2017   No Known Allergies     Medication List        Accurate as of 09/08/17  9:39 AM. Always use your most recent med list.          aspirin 81 MG tablet Take 81 mg by mouth daily.   atorvastatin 20 MG tablet Commonly known as:  LIPITOR TAKE 1 TABLET BY MOUTH AT BEDTIME   b complex vitamins tablet Take 1 tablet by mouth daily.   clotrimazole-betamethasone cream Commonly known as:  LOTRISONE Apply topically 2 (two) times daily.       Allergies: No Known Allergies  Family History: Family History  Problem Relation Age of Onset  . Hypertension Mother   . Diabetes Mother        lost both legs  . Kidney disease Mother   . Hypertension Father   . Emphysema Father   . Sickle cell trait Father   .  Hypertension Brother   . Hyperlipidemia Brother   . Sickle cell trait Sister     Social History:  reports that he has been smoking cigarettes.  He has a 10.50 pack-year smoking history. He has never used smokeless tobacco. He reports that he has current or past drug history. Drug: Marijuana. He reports that he does not drink alcohol.  ROS: UROLOGY Frequent Urination?: No Hard to postpone urination?: No Burning/pain with urination?: No Get up at night to urinate?: No Leakage of urine?: No Urine stream starts and stops?: No Trouble starting stream?: No Do you have to strain to urinate?: No Blood in urine?: No Urinary tract infection?: No Sexually transmitted disease?: No Injury to kidneys or bladder?: No Painful intercourse?: No Weak stream?: No Erection problems?: No Penile pain?: No  Gastrointestinal Nausea?: No Vomiting?: No Indigestion/heartburn?: No Diarrhea?: No Constipation?: No  Constitutional Fever: No Night sweats?: No Weight loss?: No Fatigue?: No  Skin Skin rash/lesions?: No Itching?: No  Eyes Blurred vision?: No Double vision?: No  Ears/Nose/Throat Sore throat?: No Sinus problems?: No  Hematologic/Lymphatic Swollen glands?: No Easy bruising?: No  Cardiovascular Leg swelling?: No Chest pain?: No  Respiratory Cough?: No Shortness of breath?: No  Endocrine Excessive thirst?: No  Musculoskeletal Back pain?: No Joint pain?: No  Neurological Headaches?: No Dizziness?: No  Psychologic Depression?: No Anxiety?: No  Physical Exam: BP 125/70   Pulse 85   Ht 6\' 1"  (1.854 m)   Wt 165 lb (74.8 kg)   BMI 21.77 kg/m   Constitutional:  Alert and oriented, No acute distress. HEENT: [place] AT, moist mucus membranes.  Trachea midline, no masses. Cardiovascular: No clubbing, cyanosis, or edema. Respiratory: Normal respiratory effort, no increased work of breathing. GI: Abdomen is soft, nontender, nondistended, no abdominal masses GU: No CVA  tenderness Lymph: No cervical or inguinal lymphadenopathy. Skin: No rashes, bruises or suspicious lesions. Neurologic: Grossly intact, no focal deficits, moving all 4 extremities. Psychiatric: Normal mood and affect.    Assessment & Plan:   Small, stable enhancing left renal mass.  I again discussed with Tyler Cohen this may be a small renal cell carcinoma.  Options were again discussed including surveillance, surgical management and renal mass biopsy for risk stratification.  He has elected to continue surveillance and will obtain a follow-up CT in 6-8 months.   Abbie Sons, [place] [address], [place], [place] [address] [phone]

## 2017-09-23 DIAGNOSIS — L401 Generalized pustular psoriasis: Secondary | ICD-10-CM | POA: Diagnosis not present

## 2017-10-05 DIAGNOSIS — L401 Generalized pustular psoriasis: Secondary | ICD-10-CM | POA: Diagnosis not present

## 2017-10-18 ENCOUNTER — Encounter: Payer: Self-pay | Admitting: Unknown Physician Specialty

## 2017-11-04 ENCOUNTER — Ambulatory Visit: Payer: Medicare HMO

## 2017-11-04 ENCOUNTER — Encounter: Payer: Medicare HMO | Admitting: Unknown Physician Specialty

## 2017-11-07 ENCOUNTER — Ambulatory Visit (INDEPENDENT_AMBULATORY_CARE_PROVIDER_SITE_OTHER): Payer: Medicare HMO

## 2017-11-07 ENCOUNTER — Telehealth: Payer: Self-pay | Admitting: *Deleted

## 2017-11-07 VITALS — BP 110/78 | HR 93 | Temp 98.9°F | Resp 16 | Ht 73.0 in | Wt 164.7 lb

## 2017-11-07 DIAGNOSIS — Z0000 Encounter for general adult medical examination without abnormal findings: Secondary | ICD-10-CM | POA: Diagnosis not present

## 2017-11-07 NOTE — Patient Instructions (Addendum)
Tyler Cohen , Thank you for taking time to come for yourMedicare Wellness Visit. I appreciate your ongoing commitment to your health goals. Please review the following plan we discussed and let me know if I can assist you in the future.   Screening recommendations/referrals: Colonoscopy: completed 12/01/2015 Recommended yearly ophthalmology/optometry visit for glaucoma screening and checkup Recommended yearly dental visit for hygiene and checkup  Vaccinations: Influenza vaccine: up to date, due 12/2017 Pneumococcal vaccine: due at 36 Tdap vaccine: up to date Shingles vaccine: shingrix eligible, check with your insurance company for coverage   Advanced directives: Advance directive discussed with you today. I have provided a copy for you to complete at home and have notarized. Once this is complete please bring a copy in to our office so we can scan it into your chart.  Conditions/risks identified: Smoking cessation discussed  Next appointment: Follow up on 11/08/2017 at 10:00am with Adriana Pollok,PA. Follow up in one year for your annual wellness exam.    Preventive Care 40-64 Years, Male Preventive care refers to lifestyle choices and visits with your health care provider that can promote health and wellness. What does preventive care include?  A yearly physical exam. This is also called an annual well check.  Dental exams once or twice a year.  Routine eye exams. Ask your health care provider how often you should have your eyes checked.  Personal lifestyle choices, including:  Daily care of your teeth and gums.  Regular physical activity.  Eating a healthy diet.  Avoiding tobacco and drug use.  Limiting alcohol use.  Practicing safe sex.  Taking low-dose aspirin every day starting at age 60. What happens during an annual well check? The services and screenings done by your health care provider during your annual well check will depend on your age, overall health,  lifestyle risk factors, and family history of disease. Counseling  Your health care provider may ask you questions about your:  Alcohol use.  Tobacco use.  Drug use.  Emotional well-being.  Home and relationship well-being.  Sexual activity.  Eating habits.  Work and work Statistician. Screening  You may have the following tests or measurements:  Height, weight, and BMI.  Blood pressure.  Lipid and cholesterol levels. These may be checked every 5 years, or more frequently if you are over 7 years old.  Skin check.  Lung cancer screening. You may have this screening every year starting at age 64 if you have a 30-pack-year history of smoking and currently smoke or have quit within the past 15 years.  Fecal occult blood test (FOBT) of the stool. You may have this test every year starting at age 1.  Flexible sigmoidoscopy or colonoscopy. You may have a sigmoidoscopy every 5 years or a colonoscopy every 10 years starting at age 66.  Prostate cancer screening. Recommendations will vary depending on your family history and other risks.  Hepatitis C blood test.  Hepatitis B blood test.  Sexually transmitted disease (STD) testing.  Diabetes screening. This is done by checking your blood sugar (glucose) after you have not eaten for a while (fasting). You may have this done every 1-3 years. Discuss your test results, treatment options, and if necessary, the need for more tests with your health care provider. Vaccines  Your health care provider may recommend certain vaccines, such as:  Influenza vaccine. This is recommended every year.  Tetanus, diphtheria, and acellular pertussis (Tdap, Td) vaccine. You may need a Td booster every 10 years.  Zoster vaccine. You may need this after age 63.  Pneumococcal 13-valent conjugate (PCV13) vaccine. You may need this if you have certain conditions and have not been vaccinated.  Pneumococcal polysaccharide (PPSV23) vaccine. You may  need one or two doses if you smoke cigarettes or if you have certain conditions. Talk to your health care provider about which screenings and vaccines you need and how often you need them. This information is not intended to replace advice given to you by your health care provider. Make sure you discuss any questions you have with your health care provider. Document Released: 04/18/2015 Document Revised: 12/10/2015 Document Reviewed: 01/21/2015 Elsevier Interactive Patient Education  2017 [place] Prevention in the Home Falls can cause injuries. They can happen to people of all ages. There are many things you can do to make your home safe and to help prevent falls. What can I do on the outside of my home?  Regularly fix the edges of walkways and driveways and fix any cracks.  Remove anything that might make you trip as you walk through a door, such as a raised step or threshold.  Trim any bushes or trees on the path to your home.  Use bright outdoor lighting.  Clear any walking paths of anything that might make someone trip, such as rocks or tools.  Regularly check to see if handrails are loose or broken. Make sure that both sides of any steps have handrails.  Any raised decks and porches should have guardrails on the edges.  Have any leaves, snow, or ice cleared regularly.  Use sand or salt on walking paths during winter.  Clean up any spills in your garage right away. This includes oil or grease spills. What can I do in the bathroom?  Use night lights.  Install grab bars by the toilet and in the tub and shower. Do not use towel bars as grab bars.  Use non-skid mats or decals in the tub or shower.  If you need to sit down in the shower, use a plastic, non-slip stool.  Keep the floor dry. Clean up any water that spills on the floor as soon as it happens.  Remove soap buildup in the tub or shower regularly.  Attach bath mats securely with double-sided non-slip  rug tape.  Do not have throw rugs and other things on the floor that can make you trip. What can I do in the bedroom?  Use night lights.  Make sure that you have a light by your bed that is easy to reach.  Do not use any sheets or blankets that are too big for your bed. They should not hang down onto the floor.  Have a firm chair that has side arms. You can use this for support while you get dressed.  Do not have throw rugs and other things on the floor that can make you trip. What can I do in the kitchen?  Clean up any spills right away.  Avoid walking on wet floors.  Keep items that you use a lot in easy-to-reach places.  If you need to reach something above you, use a strong step stool that has a grab bar.  Keep electrical cords out of the way.  Do not use floor polish or wax that makes floors slippery. If you must use wax, use non-skid floor wax.  Do not have throw rugs and other things on the floor that can make you trip. What can I do with  my stairs?  Do not leave any items on the stairs.  Make sure that there are handrails on both sides of the stairs and use them. Fix handrails that are broken or loose. Make sure that handrails are as long as the stairways.  Check any carpeting to make sure that it is firmly attached to the stairs. Fix any carpet that is loose or worn.  Avoid having throw rugs at the top or bottom of the stairs. If you do have throw rugs, attach them to the floor with carpet tape.  Make sure that you have a light switch at the top of the stairs and the bottom of the stairs. If you do not have them, ask someone to add them for you. What else can I do to help prevent falls?  Wear shoes that:  Do not have high heels.  Have rubber bottoms.  Are comfortable and fit you well.  Are closed at the toe. Do not wear sandals.  If you use a stepladder:  Make sure that it is fully opened. Do not climb a closed stepladder.  Make sure that both sides of  the stepladder are locked into place.  Ask someone to hold it for you, if possible.  Clearly mark and make sure that you can see:  Any grab bars or handrails.  First and last steps.  Where the edge of each step is.  Use tools that help you move around (mobility aids) if they are needed. These include:  Canes.  Walkers.  Scooters.  Crutches.  Turn on the lights when you go into a dark area. Replace any light bulbs as soon as they burn out.  Set up your furniture so you have a clear path. Avoid moving your furniture around.  If any of your floors are uneven, fix them.  If there are any pets around you, be aware of where they are.  Review your medicines with your doctor. Some medicines can make you feel dizzy. This can increase your chance of falling. Ask your doctor what other things that you can do to help prevent falls. This information is not intended to replace advice given to you by your health care provider. Make sure you discuss any questions you have with your health care provider. Document Released: 01/16/2009 Document Revised: 08/28/2015 Document Reviewed: 04/26/2014 Elsevier Interactive Patient Education  2017 [employer].   Steps to Quit Smoking Smoking tobacco can be bad for your health. It can also affect almost every organ in your body. Smoking puts you and people around you at risk for many serious long-lasting (chronic) diseases. Quitting smoking is hard, but it is one of the best things that you can do for your health. It is never too late to quit. What are the benefits of quitting smoking? When you quit smoking, you lower your risk for getting serious diseases and conditions. They can include:  Lung cancer or lung disease.  Heart disease.  Stroke.  Heart attack.  Not being able to have children (infertility).  Weak bones (osteoporosis) and broken bones (fractures).  If you have coughing, wheezing, and shortness of breath, those symptoms may get  better when you quit. You may also get sick less often. If you are pregnant, quitting smoking can help to lower your chances of having a baby of low birth weight. What can I do to help me quit smoking? Talk with your doctor about what can help you quit smoking. Some things you can do (strategies) include:  Quitting smoking totally, instead of slowly cutting back how much you smoke over a period of time.  Going to in-person counseling. You are more likely to quit if you go to many counseling sessions.  Using resources and support systems, such as: ? Database administrator with a Social worker. ? Phone quitlines. ? Careers information officer. ? Support groups or group counseling. ? Text messaging programs. ? Mobile phone apps or applications.  Taking medicines. Some of these medicines may have nicotine in them. If you are pregnant or breastfeeding, do not take any medicines to quit smoking unless your doctor says it is okay. Talk with your doctor about counseling or other things that can help you.  Talk with your doctor about using more than one strategy at the same time, such as taking medicines while you are also going to in-person counseling. This can help make quitting easier. What things can I do to make it easier to quit? Quitting smoking might feel very hard at first, but there is a lot that you can do to make it easier. Take these steps:  Talk to your family and friends. Ask them to support and encourage you.  Call phone quitlines, reach out to support groups, or work with a Social worker.  Ask people who smoke to not smoke around you.  Avoid places that make you want (trigger) to smoke, such as: ? Bars. ? Parties. ? Smoke-break areas at work.  Spend time with people who do not smoke.  Lower the stress in your life. Stress can make you want to smoke. Try these things to help your stress: ? Getting regular exercise. ? Deep-breathing exercises. ? Yoga. ? Meditating. ? Doing a body scan. To  do this, close your eyes, focus on one area of your body at a time from head to toe, and notice which parts of your body are tense. Try to relax the muscles in those areas.  Download or buy apps on your mobile phone or tablet that can help you stick to your quit plan. There are many free apps, such as QuitGuide from the [employer] Office manager for Disease Control and Prevention). You can find more support from smokefree.gov and other websites.  This information is not intended to replace advice given to you by your health care provider. Make sure you discuss any questions you have with your health care provider. Document Released: 01/16/2009 Document Revised: 11/18/2015 Document Reviewed: 08/06/2014 Elsevier Interactive Patient Education  2018 [employer].

## 2017-11-07 NOTE — Progress Notes (Signed)
Subjective:   Tyler Cohen is a 62 y.o. male who presents for Medicare Annual/Subsequent preventive examination.  Review of Systems:   Cardiac Risk Factors include: advanced age (>54men, >29 women);dyslipidemia;male gender;smoking/ tobacco exposure     Objective:    Vitals: BP 110/78 (BP Location: Left Arm, Patient Position: Sitting)   Pulse 93   Temp 98.9 F (37.2 C) (Temporal)   Resp 16   Ht 6\' 1"  (1.854 m)   Wt 164 lb 11.2 oz (74.7 kg)   BMI 21.73 kg/m   Body mass index is 21.73 kg/m.  Advanced Directives 11/07/2017 10/14/2016 04/09/2016 12/01/2015 10/15/2015  Does Patient Have a Medical Advance Directive? No No No No No  Does patient want to make changes to medical advance directive? - Yes (MAU/Ambulatory/Procedural Areas - Information given) - - -  Would patient like information on creating a medical advance directive? Yes (MAU/Ambulatory/Procedural Areas - Information given) - - Yes - Educational materials given -    Tobacco Social History   Tobacco Use  Smoking Status Current Every Day Smoker  . Packs/day: 0.25  . Years: 42.00  . Pack years: 10.50  . Types: Cigarettes  Smokeless Tobacco Never Used  Tobacco Comment   1 pack in 4 days      Ready to quit: Yes Counseling given: Yes Comment: 1 pack in 4 days    Clinical Intake:  Pre-visit preparation completed: Yes  Pain : No/denies pain     Nutritional Status: BMI of 19-24  Normal Nutritional Risks: None Diabetes: No  How often do you need to have someone help you when you read instructions, pamphlets, or other written materials from your doctor or pharmacy?: 1 - Never What is the last grade level you completed in school?: 12th grade  Interpreter Needed?: No  Information entered by :: Aizley Stenseth,LPN   Past Medical History:  Diagnosis Date  . Arthritis   . Heart murmur   . Hyperlipidemia   . Macrocytosis   . Thyroid disease   . Tobacco abuse    Past Surgical History:  Procedure Laterality  Date  . COLONOSCOPY WITH PROPOFOL N/A 12/01/2015   Procedure: COLONOSCOPY WITH PROPOFOL;  Surgeon: Lucilla Lame, MD;  Location: [facility];  Service: Endoscopy;  Laterality: N/A;  . POLYPECTOMY  12/01/2015   Procedure: POLYPECTOMY;  Surgeon: Lucilla Lame, MD;  Location: [facility];  Service: Endoscopy;;   Family History  Problem Relation Age of Onset  . Hypertension Mother   . Diabetes Mother        lost both legs  . Kidney disease Mother   . Hypertension Father   . Emphysema Father   . Sickle cell trait Father   . Hypertension Brother   . Hyperlipidemia Brother   . Sickle cell trait Sister    Social History   Socioeconomic History  . Marital status: Married    Spouse name: Not on file  . Number of children: Not on file  . Years of education: Not on file  . Highest education level: High school graduate  Occupational History  . Not on file  Social Needs  . Financial resource strain: Not hard at all  . Food insecurity:    Worry: Never true    Inability: Never true  . Transportation needs:    Medical: No    Non-medical: No  Tobacco Use  . Smoking status: Current Every Day Smoker    Packs/day: 0.25    Years: 42.00  Pack years: 10.50    Types: Cigarettes  . Smokeless tobacco: Never Used  . Tobacco comment: 1 pack in 4 days   Substance and Sexual Activity  . Alcohol use: No  . Drug use: Yes    Types: Marijuana    Comment: pt states he smokes every once in a while  . Sexual activity: Not on file  Lifestyle  . Physical activity:    Days per week: 0 days    Minutes per session: 0 min  . Stress: Not at all  Relationships  . Social connections:    Talks on phone: Twice a week    Gets together: Twice a week    Attends religious service: More than 4 times per year    Active member of club or organization: No    Attends meetings of clubs or organizations: Never    Relationship status: Married  Other Topics Concern  . Not on file  Social History  Narrative   Works part time.    Outpatient Encounter Medications as of 11/07/2017  Medication Sig  . aspirin 81 MG tablet Take 81 mg by mouth daily.  Marland Kitchen atorvastatin (LIPITOR) 20 MG tablet TAKE 1 TABLET BY MOUTH AT BEDTIME  . b complex vitamins tablet Take 1 tablet by mouth daily.  . clotrimazole-betamethasone (LOTRISONE) cream Apply topically 2 (two) times daily.   No facility-administered encounter medications on file as of 11/07/2017.     Activities of Daily Living In your present state of health, do you have any difficulty performing the following activities: 11/07/2017  Hearing? N  Vision? Y  Difficulty concentrating or making decisions? N  Walking or climbing stairs? N  Dressing or bathing? N  Doing errands, shopping? N  Preparing Food and eating ? N  Using the Toilet? N  In the past six months, have you accidently leaked urine? N  Do you have problems with loss of bowel control? N  Managing your Medications? N  Managing your Finances? N  Housekeeping or managing your Housekeeping? N  Some recent data might be hidden    Patient Care Team: Kathrine Haddock, NP as PCP - General (Nurse Practitioner)   Assessment:   This is a routine wellness examination for Tyler Cohen.  Exercise Activities and Dietary recommendations Current Exercise Habits: Home exercise routine, Time (Minutes): 30, Frequency (Times/Week): 3, Weekly Exercise (Minutes/Week): 90, Intensity: Mild, Exercise limited by: None identified  Goals    . Quit Smoking     Smoking cessation discussed        Fall Risk Fall Risk  11/07/2017 10/14/2016 10/15/2015  Falls in the past year? No No No   Is the patient's home free of loose throw rugs in walkways, pet beds, electrical cords, etc?   no      Grab bars in the bathroom? no      Handrails on the stairs?   no stairs       Adequate lighting?   yes  Timed Get Up and Go Performed: Completed in 8 seconds with no use of assistive devices, steady gait. No intervention needed  at this time.   Depression Screen PHQ 2/9 Scores 11/07/2017 10/14/2016 10/15/2015  PHQ - 2 Score 0 0 0    Cognitive Function     6CIT Screen 11/07/2017 10/14/2016  What Year? 0 points 0 points  What month? 0 points 0 points  What time? 0 points 0 points  Count back from 20 0 points 0 points  Months in reverse 0  points 0 points  Repeat phrase 0 points 0 points  Total Score 0 0    Immunization History  Administered Date(s) Administered  . Influenza,inj,Quad PF,6+ Mos 02/17/2016, 05/04/2017  . Pneumococcal Polysaccharide-23 01/23/2014  . Tdap 10/24/2013    Qualifies for Shingles Vaccine? Yes, discussed shingrix vaccine   Screening Tests Health Maintenance  Topic Date Due  . INFLUENZA VACCINE  11/03/2017  . TETANUS/TDAP  10/25/2023  . COLONOSCOPY  11/30/2025  . Hepatitis C Screening  Completed  . HIV Screening  Completed   Cancer Screenings: Lung: Low Dose CT Chest recommended if Age 9-80 years, 30 pack-year currently smoking OR have quit w/in 15years. Patient does qualify. Cancer center has reached out to patient for annual screening, he will schedule that this year.  Colorectal: completed 12/01/2015  Additional Screenings: Hepatitis C Screening:completed 09/03/2015      Plan:    I have personally reviewed and addressed the Medicare Annual Wellness questionnaire and have noted the following in the patient's chart:  A. Medical and social history B. Use of alcohol, tobacco or illicit drugs  C. Current medications and supplements D. Functional ability and status E.  Nutritional status F.  Physical activity G. Advance directives H. List of other physicians I.  Hospitalizations, surgeries, and ER visits in previous 12 months J.  [place] such as hearing and vision if needed, cognitive and depression L. Referrals and appointments   In addition, I have reviewed and discussed with patient certain preventive protocols, quality metrics, and best practice  recommendations. A written personalized care plan for preventive services as well as general preventive health recommendations were provided to patient.   Signed,  Tyler Aas, LPN Nurse Health Advisor   Nurse Notes: intermittent dull pain in right side of abdomen for couple days. 5/10 pain when it is bothering him. Notices the pain more with breathing. Has CPE with Dorna Bloom tomorrow 11/08/2017

## 2017-11-08 ENCOUNTER — Ambulatory Visit (INDEPENDENT_AMBULATORY_CARE_PROVIDER_SITE_OTHER): Payer: Medicare HMO | Admitting: Physician Assistant

## 2017-11-08 ENCOUNTER — Encounter: Payer: Self-pay | Admitting: Physician Assistant

## 2017-11-08 ENCOUNTER — Telehealth: Payer: Self-pay | Admitting: *Deleted

## 2017-11-08 ENCOUNTER — Other Ambulatory Visit: Payer: Self-pay

## 2017-11-08 VITALS — BP 108/69 | HR 71 | Temp 97.7°F | Ht 71.5 in | Wt 158.2 lb

## 2017-11-08 DIAGNOSIS — Z0000 Encounter for general adult medical examination without abnormal findings: Secondary | ICD-10-CM

## 2017-11-08 DIAGNOSIS — Z122 Encounter for screening for malignant neoplasm of respiratory organs: Secondary | ICD-10-CM

## 2017-11-08 DIAGNOSIS — Z131 Encounter for screening for diabetes mellitus: Secondary | ICD-10-CM

## 2017-11-08 DIAGNOSIS — R718 Other abnormality of red blood cells: Secondary | ICD-10-CM | POA: Diagnosis not present

## 2017-11-08 DIAGNOSIS — E7800 Pure hypercholesterolemia, unspecified: Secondary | ICD-10-CM | POA: Diagnosis not present

## 2017-11-08 DIAGNOSIS — Z87891 Personal history of nicotine dependence: Secondary | ICD-10-CM

## 2017-11-08 NOTE — Progress Notes (Signed)
Subjective:    Patient ID: Tyler Cohen, male    DOB: 01-30-1956, 61 y.o.   MRN: [id number]  Tyler Cohen is a 62 y.o. male presenting on 11/08/2017 for Annual Exam and Abdominal Pain (pt states having right lower abd pain on and off for a couple of days)   HPI   Presenting today for CPE - attends church. Lives in [place] with wife. Married 16 years. Wife has grown children.   Colonoscopy: 11/2015 noncancerous polyps, repeat 10 years Prostate cancer: no history Tobacco use: wants to quit own, currently smoking 5 cigarettes PSA: 01/2017 normal   HLD: Has not been taking medication Lipitor 20 mg, he has not been taking it consistently. He says he has been overwhelmed with his wife being in the hospital.  Says he has intermittent abdominal pain that is now improved. No nausea, vomiting, diarrhea.   Past Medical History:  Diagnosis Date  . Arthritis   . Heart murmur   . Hyperlipidemia   . Macrocytosis   . Thyroid disease   . Tobacco abuse    Past Surgical History:  Procedure Laterality Date  . COLONOSCOPY WITH PROPOFOL N/A 12/01/2015   Procedure: COLONOSCOPY WITH PROPOFOL;  Surgeon: Lucilla Lame, MD;  Location: [facility];  Service: Endoscopy;  Laterality: N/A;  . POLYPECTOMY  12/01/2015   Procedure: POLYPECTOMY;  Surgeon: Lucilla Lame, MD;  Location: [facility];  Service: Endoscopy;;   Social History   Socioeconomic History  . Marital status: Married    Spouse name: Not on file  . Number of children: Not on file  . Years of education: Not on file  . Highest education level: High school graduate  Occupational History  . Not on file  Social Needs  . Financial resource strain: Not hard at all  . Food insecurity:    Worry: Never true    Inability: Never true  . Transportation needs:    Medical: No    Non-medical: No  Tobacco Use  . Smoking status: Current Every Day Smoker    Packs/day: 0.25    Years: 42.00    Pack years: 10.50    Types: Cigarettes   . Smokeless tobacco: Never Used  . Tobacco comment: 1 pack in 4 days   Substance and Sexual Activity  . Alcohol use: No  . Drug use: Yes    Types: Marijuana    Comment: pt states he smokes every once in a while  . Sexual activity: Not on file  Lifestyle  . Physical activity:    Days per week: 0 days    Minutes per session: 0 min  . Stress: Not at all  Relationships  . Social connections:    Talks on phone: Twice a week    Gets together: Twice a week    Attends religious service: More than 4 times per year    Active member of club or organization: No    Attends meetings of clubs or organizations: Never    Relationship status: Married  . Intimate partner violence:    Fear of current or ex partner: No    Emotionally abused: No    Physically abused: No    Forced sexual activity: No  Other Topics Concern  . Not on file  Social History Narrative   Works part time.   Family History  Problem Relation Age of Onset  . Hypertension Mother   . Diabetes Mother        lost both legs  .  Kidney disease Mother   . Hypertension Father   . Emphysema Father   . Sickle cell trait Father   . Hypertension Brother   . Hyperlipidemia Brother   . Sickle cell trait Sister    Current Outpatient Medications on File Prior to Visit  Medication Sig  . aspirin 81 MG tablet Take 81 mg by mouth daily.  Marland Kitchen atorvastatin (LIPITOR) 20 MG tablet TAKE 1 TABLET BY MOUTH AT BEDTIME  . b complex vitamins tablet Take 1 tablet by mouth daily.  . clotrimazole-betamethasone (LOTRISONE) cream Apply topically 2 (two) times daily.   No current facility-administered medications on file prior to visit.     Review of Systems Per HPI unless specifically indicated above      Objective:    BP 108/69   Pulse 71   Temp 97.7 F (36.5 C) (Oral)   Ht 5' 11.5" (1.816 m)   Wt 158 lb 3.2 oz (71.8 kg)   SpO2 96%   BMI 21.76 kg/m   Wt Readings from Last 3 Encounters:  11/08/17 158 lb 3.2 oz (71.8 kg)  11/07/17  164 lb 11.2 oz (74.7 kg)  09/08/17 165 lb (74.8 kg)    Physical Exam  Constitutional: He is oriented to person, place, and time. He appears well-developed and well-nourished.  Cardiovascular: Normal rate and regular rhythm.  Pulmonary/Chest: Effort normal and breath sounds normal.  Abdominal: Normal appearance and bowel sounds are normal. There is no tenderness.  Neurological: He is alert and oriented to person, place, and time.  Skin: Skin is warm and dry.  Psychiatric: He has a normal mood and affect. His behavior is normal.   Results for orders placed or performed during the hospital encounter of 08/08/17  I-STAT creatinine  Result Value Ref Range   Creatinine, Ser 0.90 0.61 - 1.24 mg/dL      Assessment & Plan:   1. Annual physical exam  Has been screened for PSA October.   2. Microcytosis  - CBC with Differential  3. Pure hypercholesterolemia  Needs to be consistent with taking medications.   - Lipid Profile  4. Diabetes mellitus screening  - Comp Met (CMET)    Follow up plan: Return in about 1 year (around 11/09/2018) for CPE and follow up .  Carles Collet, PA-C [facility] 11/08/2017, 1:05 PM

## 2017-11-08 NOTE — Patient Instructions (Signed)

## 2017-11-08 NOTE — Telephone Encounter (Signed)
Patient has been notified that annual lung cancer screening low dose CT scan is due currently or will be in near future. Confirmed that patient is within the age range of 55-77, and asymptomatic, (no signs or symptoms of lung cancer). Patient denies illness that would prevent curative treatment for lung cancer if found. Verified smoking history, (current, 31.75 pack year). The shared decision making visit was done 11/09/16. Patient is agreeable for CT scan being scheduled.

## 2017-11-09 LAB — CBC WITH DIFFERENTIAL/PLATELET
Basophils Absolute: 0 10*3/uL (ref 0.0–0.2)
Basos: 0 %
EOS (ABSOLUTE): 0.4 10*3/uL (ref 0.0–0.4)
Eos: 5 %
Hematocrit: 43.4 % (ref 37.5–51.0)
Hemoglobin: 14.6 g/dL (ref 13.0–17.7)
Immature Grans (Abs): 0 10*3/uL (ref 0.0–0.1)
Immature Granulocytes: 0 %
Lymphocytes Absolute: 2.5 10*3/uL (ref 0.7–3.1)
Lymphs: 38 %
MCH: 32.4 pg (ref 26.6–33.0)
MCHC: 33.6 g/dL (ref 31.5–35.7)
MCV: 96 fL (ref 79–97)
Monocytes Absolute: 0.4 10*3/uL (ref 0.1–0.9)
Monocytes: 6 %
Neutrophils Absolute: 3.3 10*3/uL (ref 1.4–7.0)
Neutrophils: 51 %
Platelets: 207 10*3/uL (ref 150–450)
RBC: 4.51 x10E6/uL (ref 4.14–5.80)
RDW: 13.6 % (ref 12.3–15.4)
WBC: 6.7 10*3/uL (ref 3.4–10.8)

## 2017-11-09 LAB — LIPID PANEL
Chol/HDL Ratio: 3.4 ratio (ref 0.0–5.0)
Cholesterol, Total: 174 mg/dL (ref 100–199)
HDL: 51 mg/dL (ref >39)
LDL Calculated: 114 mg/dL — ABNORMAL HIGH (ref 0–99)
Triglycerides: 46 mg/dL (ref 0–149)
VLDL Cholesterol Cal: 9 mg/dL (ref 5–40)

## 2017-11-09 LAB — COMPREHENSIVE METABOLIC PANEL
ALT: 21 IU/L (ref 0–44)
AST: 16 IU/L (ref 0–40)
Albumin/Globulin Ratio: 1.4 (ref 1.2–2.2)
Albumin: 3.9 g/dL (ref 3.6–4.8)
Alkaline Phosphatase: 85 IU/L (ref 39–117)
BUN/Creatinine Ratio: 14 (ref 10–24)
BUN: 12 mg/dL (ref 8–27)
Bilirubin Total: 0.6 mg/dL (ref 0.0–1.2)
CO2: 21 mmol/L (ref 20–29)
Calcium: 9.3 mg/dL (ref 8.6–10.2)
Chloride: 107 mmol/L — ABNORMAL HIGH (ref 96–106)
Creatinine, Ser: 0.86 mg/dL (ref 0.76–1.27)
GFR calc Af Amer: 108 mL/min/{1.73_m2} (ref >59)
GFR calc non Af Amer: 94 mL/min/{1.73_m2} (ref >59)
Globulin, Total: 2.7 g/dL (ref 1.5–4.5)
Glucose: 85 mg/dL (ref 65–99)
Potassium: 4.7 mmol/L (ref 3.5–5.2)
Sodium: 140 mmol/L (ref 134–144)
Total Protein: 6.6 g/dL (ref 6.0–8.5)

## 2017-11-21 ENCOUNTER — Ambulatory Visit
Admission: RE | Admit: 2017-11-21 | Discharge: 2017-11-21 | Disposition: A | Discharge status: Home / Self Care | Payer: Medicare HMO | Source: Ambulatory Visit | Attending: Oncology | Admitting: Oncology

## 2017-11-21 ENCOUNTER — Telehealth: Payer: Self-pay | Admitting: *Deleted

## 2017-11-21 DIAGNOSIS — R69 Illness, unspecified: Secondary | ICD-10-CM | POA: Diagnosis not present

## 2017-11-21 DIAGNOSIS — F17210 Nicotine dependence, cigarettes, uncomplicated: Secondary | ICD-10-CM | POA: Insufficient documentation

## 2017-11-21 DIAGNOSIS — Z122 Encounter for screening for malignant neoplasm of respiratory organs: Secondary | ICD-10-CM | POA: Diagnosis not present

## 2017-11-21 DIAGNOSIS — Z87891 Personal history of nicotine dependence: Secondary | ICD-10-CM

## 2017-11-21 NOTE — Telephone Encounter (Signed)
Called report  IMPRESSION: Lung-RADS 4A, suspicious. Follow up low-dose chest CT without contrast in 3 months (please use the following order, "CT CHEST LCS NODULE FOLLOW-UP W/O CM") is recommended.  New multifocal patchy/nodular opacities, as described above, favoring multifocal infection. Consider interval antimicrobial therapy prior to follow-up low-dose lung cancer screening.  Emphysema (ICD10-J43.9).   Electronically Signed   By: Julian Hy M.D.   On: 11/21/2017 11:20

## 2017-11-22 ENCOUNTER — Encounter: Payer: Self-pay | Admitting: *Deleted

## 2017-11-22 ENCOUNTER — Other Ambulatory Visit: Payer: Self-pay | Admitting: Physician Assistant

## 2017-11-22 ENCOUNTER — Telehealth: Payer: Self-pay | Admitting: *Deleted

## 2017-11-22 ENCOUNTER — Telehealth: Payer: Self-pay | Admitting: Physician Assistant

## 2017-11-22 DIAGNOSIS — E041 Nontoxic single thyroid nodule: Secondary | ICD-10-CM

## 2017-11-22 DIAGNOSIS — J189 Pneumonia, unspecified organism: Secondary | ICD-10-CM

## 2017-11-22 MED ORDER — DOXYCYCLINE HYCLATE 100 MG PO TABS: 100.0000 mg | ORAL_TABLET | Freq: Two times a day (BID) | ORAL | 0 refills | Status: AC

## 2017-11-22 NOTE — Telephone Encounter (Signed)
Patient notified

## 2017-11-22 NOTE — Telephone Encounter (Signed)
Denies any questions.

## 2017-11-22 NOTE — Progress Notes (Signed)
doxc

## 2017-11-22 NOTE — Telephone Encounter (Signed)
Attempted to contact patient to discuss LDCT lung cancer screening results.  Unable to reach patient at this time.  Left Message for them to return call to [phone]  to either myself or Shawn Perkins RN to discuss the results.    

## 2017-11-22 NOTE — Telephone Encounter (Signed)
Please call patient. Lung cancer screening CT came back. Still has large thyroid nodule that needs to be ultrasounded. Will place order today. He also has findings on his CT scan that show possible infection but it needs to be followed up in 3 mo which the cancer center will order. He also needs a trial of antibiotics before his next CT scan. I will send in doxycycline 100 mg BID x 7 days which he should take before his next scan.

## 2017-11-25 ENCOUNTER — Telehealth: Payer: Self-pay | Admitting: *Deleted

## 2017-11-25 NOTE — Telephone Encounter (Signed)
Notified patient of LDCT lung cancer screening program results with recommendation for 3 month follow up imaging.  Also notified of incidental findings noted below and is encouraged to discuss further questions or if symptoms arise to notify their  PCP who will receive a copy of this not and/or the CT reports.  Results faxed to MD Patient verbalized understanding.     IMPRESSION: Lung-RADS 4A, suspicious. Follow up low-dose chest CT without contrast in 3 months (please use the following order, "CT CHEST LCS NODULE FOLLOW-UP W/O CM") is recommended.  New multifocal patchy/nodular opacities, as described above, favoring multifocal infection. Consider interval antimicrobial therapy prior to follow-up low-dose lung cancer screening.  Emphysema (ICD10-J43.9).

## 2017-11-30 ENCOUNTER — Ambulatory Visit: Payer: Medicare HMO

## 2017-12-01 ENCOUNTER — Ambulatory Visit
Admission: RE | Admit: 2017-12-01 | Discharge: 2017-12-01 | Disposition: A | Discharge status: Home / Self Care | Payer: Medicare HMO | Source: Ambulatory Visit | Attending: Physician Assistant | Admitting: Physician Assistant

## 2017-12-01 DIAGNOSIS — E042 Nontoxic multinodular goiter: Secondary | ICD-10-CM | POA: Insufficient documentation

## 2017-12-01 DIAGNOSIS — E041 Nontoxic single thyroid nodule: Secondary | ICD-10-CM | POA: Diagnosis present

## 2017-12-07 ENCOUNTER — Ambulatory Visit (INDEPENDENT_AMBULATORY_CARE_PROVIDER_SITE_OTHER): Payer: Medicare HMO | Admitting: Physician Assistant

## 2017-12-07 ENCOUNTER — Encounter: Payer: Self-pay | Admitting: Physician Assistant

## 2017-12-07 VITALS — BP 112/74 | HR 78 | Temp 98.2°F | Ht 71.5 in | Wt 160.2 lb

## 2017-12-07 DIAGNOSIS — E042 Nontoxic multinodular goiter: Secondary | ICD-10-CM

## 2017-12-07 DIAGNOSIS — J189 Pneumonia, unspecified organism: Secondary | ICD-10-CM | POA: Diagnosis not present

## 2017-12-07 DIAGNOSIS — R911 Solitary pulmonary nodule: Secondary | ICD-10-CM | POA: Diagnosis not present

## 2017-12-07 MED ORDER — DOXYCYCLINE HYCLATE 100 MG PO TABS: 100.0000 mg | ORAL_TABLET | Freq: Two times a day (BID) | ORAL | 0 refills | Status: AC

## 2017-12-07 NOTE — Patient Instructions (Signed)
Goiter A goiter is an enlarged thyroid gland. The thyroid gland is located in the lower front of the neck. The gland produces hormones that regulate mood, body temperature, pulse rate, and digestion. Most goiters are painless and are not a cause for serious concern. Goiters and conditions that cause goiters can be treated, if necessary. What are the causes? Causes of this condition include:  Diseases that attack healthy cells in your body (autoimmune diseases) and affect your thyroid function, such as: ? Graves disease. This causes too much thyroid hormone to be produced and it makes your thyroid overly active (hyperthyroidism). ? Hashimoto disease. This type of inflammation of the thyroid (thyroiditis) causes too little thyroid hormone to be produced and it makes your thyroid not active enough (hypothyroidism).  Other conditions that cause thyroiditis.  Nodular goiter. This means that there are one or more small growths on your thyroid. These can create too much thyroid hormone.  Pregnancy.  Thyroid cancer. This is rare.  Certain medicines.  Radiation exposure.  Iodine deficiency.  In some cases, the cause may not be known (idiopathic). What increases the risk? This condition is more likely to develop in:  People who have a family history of goiter.  Women.  People who do not get enough iodine in their diet.  People who are older than 84.  People who smoke tobacco.  What are the signs or symptoms? Common symptoms of this condition include:  Swelling in the lower part of the neck. This swelling can range from a very small bump to a large lump.  A tight feeling in the throat.  A hoarse voice.  Other symptoms include:  Coughing.  Wheezing.  Difficulty swallowing.  Difficulty breathing.  Bulging neck veins.  Dizziness.  In some cases, there are no symptoms and thyroid hormone levels may be normal. When a goiter is the result of hyperthyroidism, symptoms may  also include:  Nervousness or restlessness.  Inability to tolerate heat.  Unexplained weight loss.  Diarrhea.  Change in the texture of hair or skin.  Changes in heart beat, such as skipped beats, extra beats, or a rapid heart rate.  Loss of menstruation.  Shaky hands.  Increased appetite.  Sleep problems.  When a goiter is the result of hypothyroidism, symptoms may also include:  Feeling like you have no energy (lethargy).  Inability to tolerate cold.  Weight gain that is not explained by a change in diet or exercise habits.  Dry skin.  Coarse hair.  Menstrual irregularity.  Constipation.  Sadness or depression.  How is this diagnosed? This condition may be diagnosed with a medical history and physical exam. You may also have other tests, including:  Blood tests to check thyroid function.  Imaging tests, such as: ? Ultrasonography. ? CT scan. ? MRI. ? Thyroid scan. You will be given a safe radioactive injection, then images will be taken of your thyroid.  Tissue sample (biopsy) of the goiter or any nodules. This checks to see if the goiter or nodules are cancerous.  How is this treated? Treatment for this condition depends on the cause. Treatment may include:  Medicines to control your thyroid.  Anti-inflammatory or steroid medicines, if inflammation is the cause.  Iodine supplements or changes in diet, if the goiter is caused by iodine deficiency.  Radiation therapy.  Surgery to remove your thyroid.  In some cases, no treatment is necessary, and your health care provider will monitor your condition at regular checkups. Follow these instructions at  home:  Follow recommendations from your health care provider for any changes to your diet.  Take over-the-counter and prescription medicines only as told by your health care provider.  Do not use any tobacco products, including cigarettes, chewing tobacco, or e-cigarettes. If you need help quitting,  ask your health care provider.  Keep all follow-up appointments as told by your health care provider. This is important. Contact a health care provider if:  Your symptoms do not get better with treatment. Get help right away if:  You develop sudden, unexplained confusion or other mental changes.  You have nausea, vomiting, or diarrhea.  You develop a fever.  Your skin or the whites of your eyes appear yellow (jaundice).  You develop chest pain.  You have trouble breathing or swallowing.  You suddenly become very weak.  You experience extreme restlessness. This information is not intended to replace advice given to you by your health care provider. Make sure you discuss any questions you have with your health care provider. Document Released: 09/09/2009 Document Revised: 10/10/2015 Document Reviewed: 03/18/2014 Elsevier Interactive Patient Education  2018 Elsevier Inc.  

## 2017-12-07 NOTE — Progress Notes (Signed)
Subjective:    Patient ID: Tyler Cohen, male    DOB: [birth year], 62 y.o.   MRN: [id number]  Tyler Cohen is a 62 y.o. male presenting on 12/07/2017 for Results (pt here to discuss thyroid US results)   HPI   Patient has history of large complex mass of thyroid, imaged via CT neck in 2015. It met criteria for FNA and he reports he had this done by Dr. Tami Ribas at [facility] in 2015 and reports it was not cancerous. Patient reports he has not followed up since then with ENT. Mass redemonstrated on lung CT, ultrasound redemonstrated multinodular goiter. No thyroid labs available in Epic. He denies weight loss, reports he is cold natured.    Wt Readings from Last 3 Encounters:  12/07/17 160 lb 3.2 oz (72.7 kg)  11/21/17 165 lb (74.8 kg)  11/08/17 158 lb 3.2 oz (71.8 kg)   Additionally, his low dost lung screening CT from 11/21/2017 showed new multifocal patchy/nodular opacities in right lower lobe with surrounding ground glass that are suspicious. Recommended follow up CT in 3 months and consider interval treatment for infection.  Social History   Tobacco Use  . Smoking status: Current Every Day Smoker    Packs/day: 0.25    Years: 42.00    Pack years: 10.50    Types: Cigarettes  . Smokeless tobacco: Never Used  . Tobacco comment: 1 pack in 4 days   Substance Use Topics  . Alcohol use: No  . Drug use: Yes    Types: Marijuana    Comment: pt states he smokes every once in a while    Review of Systems Per HPI unless specifically indicated above     Objective:    BP 112/74   Pulse 78   Temp 98.2 F (36.8 C) (Oral)   Ht 5' 11.5" (1.816 m)   Wt 160 lb 3.2 oz (72.7 kg)   SpO2 97%   BMI 22.03 kg/m   Wt Readings from Last 3 Encounters:  12/07/17 160 lb 3.2 oz (72.7 kg)  11/21/17 165 lb (74.8 kg)  11/08/17 158 lb 3.2 oz (71.8 kg)    Physical Exam  Constitutional: He is oriented to person, place, and time. He appears well-developed and well-nourished.  Neck:  Thyromegaly present.  Large bilateral thyroid mass.   Cardiovascular: Normal rate and regular rhythm.  Pulmonary/Chest: Effort normal and breath sounds normal. He has no wheezes. He has no rales.  Neurological: He is alert and oriented to person, place, and time.  Skin: Skin is warm and dry.  Psychiatric: He has a normal mood and affect. His behavior is normal.   Results for orders placed or performed in visit on 11/08/17  Lipid Profile  Result Value Ref Range   Cholesterol, Total 174 100 - 199 mg/dL   Triglycerides 46 0 - 149 mg/dL   HDL 51 >39 mg/dL   VLDL Cholesterol Cal 9 5 - 40 mg/dL   LDL Calculated 114 (H) 0 - 99 mg/dL   Chol/HDL Ratio 3.4 0.0 - 5.0 ratio  Comp Met (CMET)  Result Value Ref Range   Glucose 85 65 - 99 mg/dL   BUN 12 8 - 27 mg/dL   Creatinine, Ser 0.86 0.76 - 1.27 mg/dL   GFR calc non Af Amer 94 >59 mL/min/1.73   GFR calc Af Amer 108 >59 mL/min/1.73   BUN/Creatinine Ratio 14 10 - 24   Sodium 140 134 - 144 mmol/L   Potassium 4.7 3.5 -  5.2 mmol/L   Chloride 107 (H) 96 - 106 mmol/L   CO2 21 20 - 29 mmol/L   Calcium 9.3 8.6 - 10.2 mg/dL   Total Protein 6.6 6.0 - 8.5 g/dL   Albumin 3.9 3.6 - 4.8 g/dL   Globulin, Total 2.7 1.5 - 4.5 g/dL   Albumin/Globulin Ratio 1.4 1.2 - 2.2   Bilirubin Total 0.6 0.0 - 1.2 mg/dL   Alkaline Phosphatase 85 39 - 117 IU/L   AST 16 0 - 40 IU/L   ALT 21 0 - 44 IU/L  CBC with Differential  Result Value Ref Range   WBC 6.7 3.4 - 10.8 x10E3/uL   RBC 4.51 4.14 - 5.80 x10E6/uL   Hemoglobin 14.6 13.0 - 17.7 g/dL   Hematocrit 43.4 37.5 - 51.0 %   MCV 96 79 - 97 fL   MCH 32.4 26.6 - 33.0 pg   MCHC 33.6 31.5 - 35.7 g/dL   RDW 13.6 12.3 - 15.4 %   Platelets 207 150 - 450 x10E3/uL   Neutrophils 51 Not Estab. %   Lymphs 38 Not Estab. %   Monocytes 6 Not Estab. %   Eos 5 Not Estab. %   Basos 0 Not Estab. %   Neutrophils Absolute 3.3 1.4 - 7.0 x10E3/uL   Lymphocytes Absolute 2.5 0.7 - 3.1 x10E3/uL   Monocytes Absolute 0.4 0.1 - 0.9  x10E3/uL   EOS (ABSOLUTE) 0.4 0.0 - 0.4 x10E3/uL   Basophils Absolute 0.0 0.0 - 0.2 x10E3/uL   Immature Granulocytes 0 Not Estab. %   Immature Grans (Abs) 0.0 0.0 - 0.1 x10E3/uL      Assessment & Plan:  1. Multinodular goiter  Labs as below. Will determine follow up pending labs. ROI for [facility] and see what follow up was necessary.   - TSH+T4F+T3Free  2. Lung infection  Trial of antibiotics before next image. I will forward this note to oncology so they know this patient has been instructed to take these antibiotics prior to his repeat CT in November.   - doxycycline (VIBRA-TABS) 100 MG tablet; Take 1 tablet (100 mg total) by mouth 2 (two) times daily for 10 days.  Dispense: 20 tablet; Refill: 0  3. Lung nodule  Seen on screening CT, see above, he is due for repeat screening in 3 mo.    Follow up plan: Return if symptoms worsen or fail to improve.  Carles Collet, PA-c [facility] 12/07/2017, 9:23 AM

## 2017-12-08 ENCOUNTER — Other Ambulatory Visit: Payer: Self-pay | Admitting: Physician Assistant

## 2017-12-08 DIAGNOSIS — E042 Nontoxic multinodular goiter: Secondary | ICD-10-CM | POA: Insufficient documentation

## 2017-12-08 DIAGNOSIS — E0590 Thyrotoxicosis, unspecified without thyrotoxic crisis or storm: Secondary | ICD-10-CM | POA: Insufficient documentation

## 2017-12-08 LAB — TSH+T4F+T3FREE
Free T4: 2.08 ng/dL — ABNORMAL HIGH (ref 0.82–1.77)
T3, Free: 5 pg/mL — ABNORMAL HIGH (ref 2.0–4.4)
TSH: <0.006 u[IU]/mL — ABNORMAL LOW (ref 0.450–4.500)

## 2018-01-03 IMAGING — MR MR ABDOMEN WO/W CM
7 of 18 series · 16 of 48 positions shown · IV contrast (multihance)
Comparison: Partial comparison to low-dose lung cancer screening CT
chest dated 11/09/2016

CLINICAL DATA: Liver lesion on CT

EXAM:
MRI ABDOMEN WITHOUT AND WITH CONTRAST
TECHNIQUE: Multiplanar multisequence MR imaging of the abdomen was performed
both before and after the administration of intravenous contrast.
CONTRAST:  15mL MULTIHANCE GADOBENATE DIMEGLUMINE 529 MG/ML IV SOLN

[Series 2: T2 · coronal · 8.0mm · 1.37mm/px · 1 of 22 slices shown (1 of 2)]
[im 1/22]
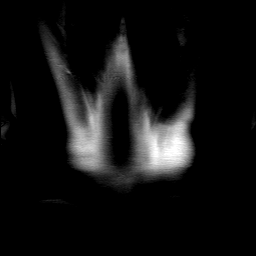

[Series 3: T2 fat-sat · axial · 7.0mm · 0.68mm/px · 1 of 26 slices shown]
[im 1/26]
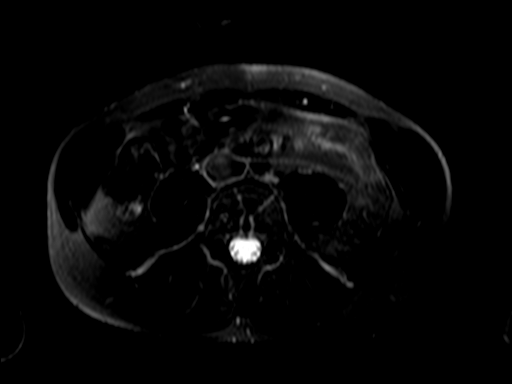

[Series 4: axial in-out of · axial · 7.0mm · 0.68mm/px · z∈[-94,+132]mm · 3 of 56 slices shown]
[im 1/56]
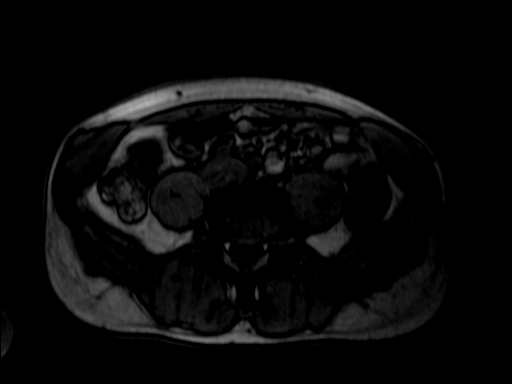
[im 28/56]
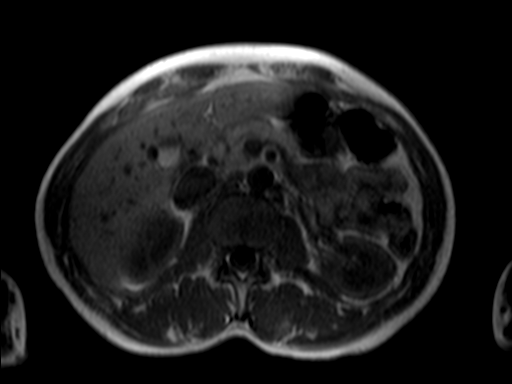
[im 56/56]
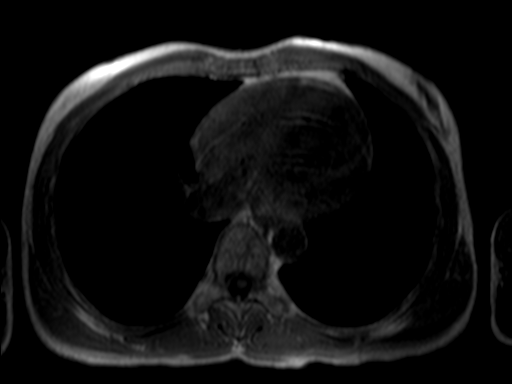

[Series 5: T2 · axial · 7.7mm · 1.48mm/px · 1 of 26 slices shown (2 of 2)]
[im 1/26]
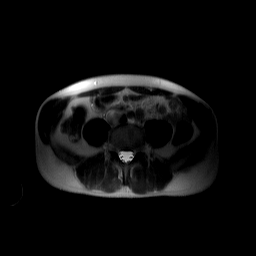

[Series 6: axial true fisp · axial · 5.0mm · 0.68mm/px · z∈[-99,+130]mm · 3 of 47 slices shown]
[im 1/47]
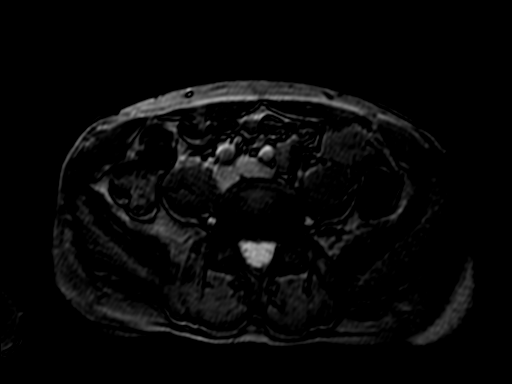
[im 24/47]
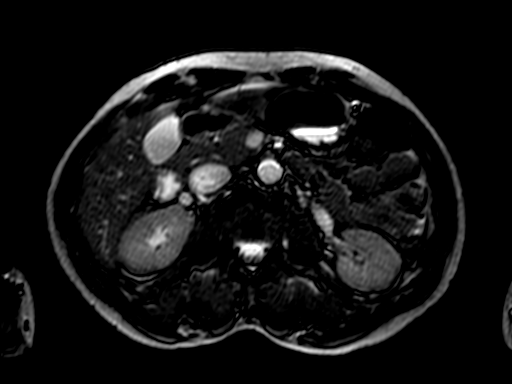
[im 47/47]
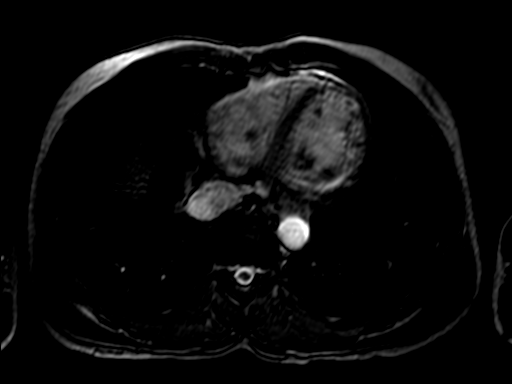

[Series 7: DWI · axial · 6.5mm · 2.73mm/px · z∈[-90,+135]mm · 5 of 90 slices shown]
[im 1/90]
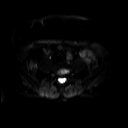
[im 23/90]
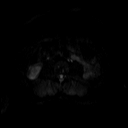
[im 45/90]
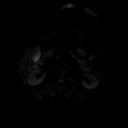
[im 67/90]
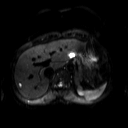
[im 90/90]
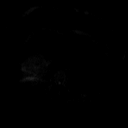

[Series 8: axial dwi_adc · axial · 6.5mm · 2.73mm/px · z∈[-90,+135]mm · 2 of 30 slices shown]
[im 1/30]
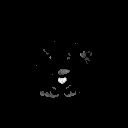
[im 30/30]
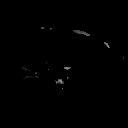

[16 of 48 positions shown; findings below may reference images not displayed]

FINDINGS: Motion degraded images.

Lower chest: Lung bases are clear.

Hepatobiliary: Six dominant T2 hyperintense lesions scattered in
both lobes of the liver, including a 15 mm lesion in the posterior
right hepatic dome (series 5/image 7) and a 21 mm lesion in the
lateral segment left hepatic lobe (series 5/image 9), with
associated peripheral nodular discontinuous enhancement
characteristic of benign hemangiomas following contrast
administration. Additional scattered subcentimeter lesions which may
reflect cysts or hemangiomas.

No suspicious hepatic lesions.

Gallbladder is unremarkable. No intrahepatic extrahepatic ductal
dilatation.

Pancreas:  Within normal limits.

Spleen:  Within normal limits.

Adrenals/Urinary Tract:  Adrenal glands are within normal limits.

Scattered subcentimeter bilateral renal cysts. 13 mm lesion with
suspected enhancement along the lateral left upper kidney (series
18/ image 29), worrisome for solid renal neoplasm. No
hydronephrosis.

Stomach/Bowel: Stomach is within normal limits.

Visualized bowel is unremarkable.

Vascular/Lymphatic:  No evidence of abdominal aortic aneurysm.

Single left renal artery and vein. No renal vein invasion.
Retroaortic left renal vein.

No suspicious abdominal lymphadenopathy.

Other:  No abdominal ascites.

Musculoskeletal: No focal osseous lesions.
IMPRESSION: Multiple benign hemangiomas in the liver, measuring up to 2.1 cm in
the lateral segment left hepatic lobe, as above. No suspicious
hepatic lesions.

**An incidental finding of potential clinical significance has been
found. 1.3 cm enhancing lesion in the lateral left upper kidney,
worrisome for solid renal neoplasm. Urologic consultation is
suggested. Single left renal artery and vein. Retroaortic left renal
vein. No renal vein invasion.**

These results will be called to the ordering clinician or
representative by the Radiologist Assistant, and communication
documented in the PACS or zVision Dashboard.

## 2018-01-30 ENCOUNTER — Telehealth: Payer: Self-pay | Admitting: Nurse Practitioner

## 2018-01-31 ENCOUNTER — Telehealth: Payer: Self-pay | Admitting: *Deleted

## 2018-01-31 DIAGNOSIS — R918 Other nonspecific abnormal finding of lung field: Secondary | ICD-10-CM

## 2018-01-31 DIAGNOSIS — Z87891 Personal history of nicotine dependence: Secondary | ICD-10-CM

## 2018-01-31 DIAGNOSIS — Z122 Encounter for screening for malignant neoplasm of respiratory organs: Secondary | ICD-10-CM

## 2018-01-31 NOTE — Telephone Encounter (Signed)
Patient is agreeable to scheduling of LCS nodule follow up scan.

## 2018-02-16 ENCOUNTER — Ambulatory Visit
Admission: RE | Admit: 2018-02-16 | Discharge: 2018-02-16 | Disposition: A | Discharge status: Home / Self Care | Payer: Medicare HMO | Source: Ambulatory Visit | Attending: Nurse Practitioner | Admitting: Nurse Practitioner

## 2018-02-16 ENCOUNTER — Ambulatory Visit
Admission: RE | Admit: 2018-02-16 | Discharge: 2018-02-16 | Disposition: A | Discharge status: Home / Self Care | Payer: Medicare HMO | Source: Ambulatory Visit | Attending: Urology | Admitting: Urology

## 2018-02-16 DIAGNOSIS — R918 Other nonspecific abnormal finding of lung field: Secondary | ICD-10-CM | POA: Insufficient documentation

## 2018-02-16 DIAGNOSIS — Z122 Encounter for screening for malignant neoplasm of respiratory organs: Secondary | ICD-10-CM

## 2018-02-16 DIAGNOSIS — D1809 Hemangioma of other sites: Secondary | ICD-10-CM | POA: Insufficient documentation

## 2018-02-16 DIAGNOSIS — N2889 Other specified disorders of kidney and ureter: Secondary | ICD-10-CM

## 2018-02-16 DIAGNOSIS — K7689 Other specified diseases of liver: Secondary | ICD-10-CM | POA: Diagnosis not present

## 2018-02-16 DIAGNOSIS — Z87891 Personal history of nicotine dependence: Secondary | ICD-10-CM | POA: Diagnosis not present

## 2018-02-16 DIAGNOSIS — N281 Cyst of kidney, acquired: Secondary | ICD-10-CM | POA: Diagnosis not present

## 2018-02-16 LAB — POCT I-STAT CREATININE: CREATININE: 1 mg/dL (ref 0.61–1.24)

## 2018-02-16 MED ORDER — IOPAMIDOL (ISOVUE-300) INJECTION 61%
100.0000 mL | Freq: Once | INTRAVENOUS | Status: AC | PRN
  Administered 2018-02-16: 100 mL via INTRAVENOUS

## 2018-02-17 ENCOUNTER — Encounter: Payer: Self-pay | Admitting: *Deleted

## 2018-02-20 ENCOUNTER — Ambulatory Visit: Admission: RE | Admit: 2018-02-20 | Payer: Medicare HMO | Source: Ambulatory Visit

## 2018-03-10 ENCOUNTER — Encounter: Payer: Self-pay | Admitting: Nurse Practitioner

## 2018-03-10 ENCOUNTER — Ambulatory Visit (INDEPENDENT_AMBULATORY_CARE_PROVIDER_SITE_OTHER): Payer: Medicare HMO | Admitting: Nurse Practitioner

## 2018-03-10 VITALS — BP 117/74 | HR 82 | Temp 97.8°F | Wt 167.0 lb

## 2018-03-10 DIAGNOSIS — N2889 Other specified disorders of kidney and ureter: Secondary | ICD-10-CM | POA: Diagnosis not present

## 2018-03-10 DIAGNOSIS — E0590 Thyrotoxicosis, unspecified without thyrotoxic crisis or storm: Secondary | ICD-10-CM

## 2018-03-10 DIAGNOSIS — Z23 Encounter for immunization: Secondary | ICD-10-CM | POA: Diagnosis not present

## 2018-03-10 NOTE — Assessment & Plan Note (Signed)
With multinodular goiter noted on u/s.  Thyroid panel repeated today.  Have placed referral to endocrine, since he missed first appointment d/t financial strain.  Also placed [facility] consult to obtain assistance financially for patient to attend appointment.

## 2018-03-10 NOTE — Progress Notes (Signed)
BP 117/74   Pulse 82   Temp 97.8 F (36.6 C) (Oral)   Wt 167 lb (75.8 kg)   SpO2 97%   BMI 22.97 kg/m    Subjective:    Patient ID: Tyler Cohen, male    DOB: 1955-04-10, 62 y.o.   MRN: [id number]  HPI: Tyler Cohen is a 62 y.o. male presents for 3 month f/u  Chief Complaint  Patient presents with  . Hyperthyroidism   HYPERTHYROIDISM: Referral to ENT and Endocrinology were placed in September.  He had scheduled appointment to endocrinology on 12/19/17, He did not attend because copay was going to be "$100". Over the past month his wife has had to be placed in nursing home, Stage 4 COPD, and he was homeless for "a little bit".  He is on a "very" fixed income.  Currently reports he has "a place to stay".  He has transportation to attend appointments.  Discussed importance of attending endocrine appointment and educated patient on hyperthyroidism and treatment required.  Discussed referral to [facility], which he agrees with.  At this time he denies palpitations, tachycardia, fatigue, weakness, increased appetite, or CP.  Does report some difficulty with concentrating.  September labs noted TSH <0.006, T3 5.0, T4 2.08 and August 2019 US thyroid noted multinodular goiter.  He was able to verbalize plan of care discussed to provider and reports understanding of importance in attending endocrine visit.  RENAL MASS: Followed by Dr. Bernardo Heater with urology and last seen 09/08/17.  He  Inquired into recent CT scan results.  Reviewed results of abdomen and lung CT with patient.  He was made aware that he need lung CT repeated in one year.  Discussed findings of "stable 15x14 mm interpolar left renal lesion" and recommended he continue follow-up visits with Dr. Bernardo Heater.  Discussed with him that if he needs assistance in attending specialist appointments to notify providers and [facility] consult can be placed.  He reports understanding and was able to verbalize this back to provider.     Relevant past medical, surgical, family and social history reviewed and updated as indicated. Interim medical history since our last visit reviewed. Allergies and medications reviewed and updated.  Review of Systems  Constitutional: Negative for activity change, diaphoresis, fatigue and fever.  Respiratory: Negative for cough, chest tightness, shortness of breath and wheezing.   Cardiovascular: Negative for chest pain, palpitations and leg swelling.  Gastrointestinal: Negative for abdominal distention, abdominal pain, constipation, diarrhea, nausea and vomiting.  Endocrine: Negative for cold intolerance, heat intolerance, polydipsia, polyphagia and polyuria.  Genitourinary: Negative.   Musculoskeletal: Negative.   Skin: Negative.   Neurological: Negative for dizziness, syncope, weakness, light-headedness, numbness and headaches.  Psychiatric/Behavioral: Negative.     Per HPI unless specifically indicated above     Objective:    BP 117/74   Pulse 82   Temp 97.8 F (36.6 C) (Oral)   Wt 167 lb (75.8 kg)   SpO2 97%   BMI 22.97 kg/m   Wt Readings from Last 3 Encounters:  03/10/18 167 lb (75.8 kg)  12/07/17 160 lb 3.2 oz (72.7 kg)  11/21/17 165 lb (74.8 kg)    Physical Exam  Constitutional: He is oriented to person, place, and time. He appears well-developed and well-nourished.  HENT:  Head: Normocephalic and atraumatic.  Right Ear: Hearing normal. No drainage.  Left Ear: Hearing normal. No drainage.  Mouth/Throat: Uvula is midline and mucous membranes are normal.  Eyes: Pupils are equal, round,  and reactive to light. Conjunctivae, EOM and lids are normal. Right eye exhibits no discharge. Left eye exhibits no discharge.  Neck: Trachea normal and normal range of motion. Neck supple. No JVD present. Carotid bruit is not present. Thyromegaly (bilateral thyroid mass noted) present.  Cardiovascular: Normal rate, regular rhythm, S1 normal, S2 normal and normal heart sounds. Exam  reveals no gallop.  No murmur heard. Pulmonary/Chest: Effort normal and breath sounds normal.  Abdominal: Soft. Bowel sounds are normal. There is no splenomegaly or hepatomegaly.  Musculoskeletal: Normal range of motion.  Neurological: He is alert and oriented to person, place, and time. He has normal reflexes.  Skin: Skin is warm, dry and intact. Capillary refill takes less than 2 seconds. No rash noted.  Psychiatric: He has a normal mood and affect. His behavior is normal. Judgment and thought content normal.  Nursing note and vitals reviewed.   Results for orders placed or performed during the hospital encounter of 02/16/18  I-STAT creatinine  Result Value Ref Range   Creatinine, Ser 1.00 0.61 - 1.24 mg/dL      Assessment & Plan:   Problem List Items Addressed This Visit      Endocrine   Hyperthyroidism    With multinodular goiter noted on u/s.  Thyroid panel repeated today.  Have placed referral to endocrine, since he missed first appointment d/t financial strain.  Also placed [facility] consult to obtain assistance financially for patient to attend appointment.        Relevant Orders   Ambulatory referral to [facility]   Thyroid Panel With TSH   Ambulatory referral to Endocrinology     Other   Renal mass    Followed by urology.       Other Visit Diagnoses    Needs flu shot    -  Primary   Relevant Orders   Flu Vaccine QUAD 6+ mos PF IM (Fluarix Quad PF) (Completed)       Follow up plan: Return in about 3 months (around 06/09/2018) for hyperthyroid.

## 2018-03-10 NOTE — Patient Instructions (Signed)

## 2018-03-10 NOTE — Assessment & Plan Note (Signed)
Followed by urology.   

## 2018-03-11 LAB — THYROID PANEL WITH TSH
Free Thyroxine Index: 3.6 (ref 1.2–4.9)
T3 Uptake Ratio: 31 % (ref 24–39)
T4, Total: 11.6 ug/dL (ref 4.5–12.0)
TSH: <0.006 u[IU]/mL — ABNORMAL LOW (ref 0.450–4.500)

## 2018-03-15 DIAGNOSIS — E0520 Thyrotoxicosis with toxic multinodular goiter without thyrotoxic crisis or storm: Secondary | ICD-10-CM | POA: Diagnosis not present

## 2018-03-22 ENCOUNTER — Encounter: Payer: Self-pay | Admitting: Urology

## 2018-03-22 ENCOUNTER — Ambulatory Visit: Payer: Medicare HMO | Admitting: Urology

## 2018-04-18 ENCOUNTER — Other Ambulatory Visit: Payer: Self-pay | Admitting: Internal Medicine

## 2018-04-18 DIAGNOSIS — E0520 Thyrotoxicosis with toxic multinodular goiter without thyrotoxic crisis or storm: Secondary | ICD-10-CM

## 2018-04-27 ENCOUNTER — Ambulatory Visit
Admission: RE | Admit: 2018-04-27 | Discharge: 2018-04-27 | Disposition: A | Discharge status: Home / Self Care | Payer: Medicare HMO | Source: Ambulatory Visit | Attending: Internal Medicine | Admitting: Internal Medicine

## 2018-04-27 DIAGNOSIS — E0520 Thyrotoxicosis with toxic multinodular goiter without thyrotoxic crisis or storm: Secondary | ICD-10-CM | POA: Insufficient documentation

## 2018-04-27 MED ORDER — SODIUM IODIDE I-123 7.4 MBQ CAPS
410.0000 | ORAL_CAPSULE | Freq: Once | ORAL | Status: AC
  Administered 2018-04-27: 410 via ORAL

## 2018-04-28 ENCOUNTER — Ambulatory Visit
Admission: RE | Admit: 2018-04-28 | Discharge: 2018-04-28 | Disposition: A | Discharge status: Home / Self Care | Payer: Medicare HMO | Source: Ambulatory Visit | Attending: Internal Medicine | Admitting: Internal Medicine

## 2018-04-28 DIAGNOSIS — E042 Nontoxic multinodular goiter: Secondary | ICD-10-CM | POA: Diagnosis not present

## 2018-04-30 ENCOUNTER — Emergency Department: Payer: Medicare HMO

## 2018-04-30 ENCOUNTER — Other Ambulatory Visit: Payer: Self-pay

## 2018-04-30 ENCOUNTER — Emergency Department
Admission: EM | Admit: 2018-04-30 | Discharge: 2018-04-30 | Disposition: A | Discharge status: Home / Self Care | Payer: Medicare HMO | Attending: Emergency Medicine | Admitting: Emergency Medicine

## 2018-04-30 DIAGNOSIS — Y998 Other external cause status: Secondary | ICD-10-CM | POA: Insufficient documentation

## 2018-04-30 DIAGNOSIS — Z79899 Other long term (current) drug therapy: Secondary | ICD-10-CM | POA: Insufficient documentation

## 2018-04-30 DIAGNOSIS — Y92410 Unspecified street and highway as the place of occurrence of the external cause: Secondary | ICD-10-CM | POA: Diagnosis not present

## 2018-04-30 DIAGNOSIS — F17210 Nicotine dependence, cigarettes, uncomplicated: Secondary | ICD-10-CM | POA: Insufficient documentation

## 2018-04-30 DIAGNOSIS — M542 Cervicalgia: Secondary | ICD-10-CM | POA: Diagnosis not present

## 2018-04-30 DIAGNOSIS — S199XXA Unspecified injury of neck, initial encounter: Secondary | ICD-10-CM | POA: Diagnosis not present

## 2018-04-30 DIAGNOSIS — E785 Hyperlipidemia, unspecified: Secondary | ICD-10-CM | POA: Diagnosis not present

## 2018-04-30 DIAGNOSIS — Y93I9 Activity, other involving external motion: Secondary | ICD-10-CM | POA: Insufficient documentation

## 2018-04-30 MED ORDER — MELOXICAM 15 MG PO TABS: 15.0000 mg | ORAL_TABLET | Freq: Every day | ORAL | 1 refills | Status: AC

## 2018-04-30 MED ORDER — CYCLOBENZAPRINE HCL 5 MG PO TABS: 5.0000 mg | ORAL_TABLET | Freq: Three times a day (TID) | ORAL | 0 refills | Status: AC | PRN

## 2018-04-30 NOTE — ED Notes (Signed)
No peripheral IV placed this visit.    Discharge instructions reviewed with patient. Questions fielded by this RN. Patient verbalizes understanding of instructions. Patient discharged home in stable condition per provider. No acute distress noted at time of discharge.    
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
## 2018-05-03 ENCOUNTER — Ambulatory Visit (INDEPENDENT_AMBULATORY_CARE_PROVIDER_SITE_OTHER): Payer: Medicare HMO | Admitting: Urology

## 2018-05-03 ENCOUNTER — Encounter: Payer: Self-pay | Admitting: Urology

## 2018-05-03 VITALS — BP 94/61 | HR 98 | Ht 73.0 in | Wt 161.2 lb

## 2018-05-03 DIAGNOSIS — N2889 Other specified disorders of kidney and ureter: Secondary | ICD-10-CM

## 2018-05-03 NOTE — Progress Notes (Signed)
05/03/2018 2:26 PM   Tyler Cohen [birth year] [id number]  Referring provider: Valerie Roys, DO [place], [place] [address]  Chief Complaint  Patient presents with  . Follow-up   Urologic history: 1.  Small left renal mass  -Incidentally identified MRI September 2018  -Surveillance recommended   HPI: 63 year old male with a 15 mm enhancing left renal mass on surveillance.  He presents for follow-up and has no complaints.  Denies flank/abdominal/pelvic pain.  He has no voiding symptoms.  Denies weight loss or gross hematuria.  A follow-up CT performed November 2019 showed a stable 15 mm left renal mass.   PMH: Past Medical History:  Diagnosis Date  . Arthritis   . Heart murmur   . Hyperlipidemia   . Macrocytosis   . Thyroid disease   . Tobacco abuse     Surgical History: Past Surgical History:  Procedure Laterality Date  . COLONOSCOPY WITH PROPOFOL N/A 12/01/2015   Procedure: COLONOSCOPY WITH PROPOFOL;  Surgeon: Lucilla Lame, MD;  Location: [place];  Service: Endoscopy;  Laterality: N/A;  . POLYPECTOMY  12/01/2015   Procedure: POLYPECTOMY;  Surgeon: Lucilla Lame, MD;  Location: [place];  Service: Endoscopy;;    Home Medications:  Allergies as of 05/03/2018   No Known Allergies     Medication List       Accurate as of May 03, 2018  2:26 PM. Always use your most recent med list.        aspirin 81 MG tablet Take 81 mg by mouth daily.   atorvastatin 20 MG tablet Commonly known as:  LIPITOR TAKE 1 TABLET BY MOUTH AT BEDTIME   b complex vitamins tablet Take 1 tablet by mouth daily.   clotrimazole-betamethasone cream Commonly known as:  LOTRISONE Apply topically 2 (two) times daily.   cyclobenzaprine 5 MG tablet Commonly known as:  FLEXERIL Take 1 tablet (5 mg total) by mouth 3 (three) times daily as needed for up to 3 days for muscle spasms.   meloxicam 15 MG tablet Commonly known as:  MOBIC Take 1 tablet (15 mg  total) by mouth daily for 7 days.       Allergies: No Known Allergies  Family History: Family History  Problem Relation Age of Onset  . Hypertension Mother   . Diabetes Mother        lost both legs  . Kidney disease Mother   . Hypertension Father   . Emphysema Father   . Sickle cell trait Father   . Hypertension Brother   . Hyperlipidemia Brother   . Sickle cell trait Sister     Social History:  reports that he has been smoking cigarettes. He has a 10.50 pack-year smoking history. He has never used smokeless tobacco. He reports current drug use. Drug: Marijuana. He reports that he does not drink alcohol.  ROS: UROLOGY Frequent Urination?: No Hard to postpone urination?: No Burning/pain with urination?: No Get up at night to urinate?: Yes Leakage of urine?: No Urine stream starts and stops?: No Trouble starting stream?: No Do you have to strain to urinate?: No Blood in urine?: No Urinary tract infection?: No Sexually transmitted disease?: No Injury to kidneys or bladder?: No Painful intercourse?: No Weak stream?: No Erection problems?: No Penile pain?: No  Gastrointestinal Nausea?: No Vomiting?: No Indigestion/heartburn?: No Diarrhea?: No Constipation?: No  Constitutional Fever: No Night sweats?: No Weight loss?: No Fatigue?: No  Skin Skin rash/lesions?: No Itching?: No  Eyes Blurred  vision?: No Double vision?: No  Ears/Nose/Throat Sore throat?: No Sinus problems?: No  Hematologic/Lymphatic Swollen glands?: No Easy bruising?: No  Cardiovascular Leg swelling?: No Chest pain?: No  Respiratory Cough?: No Shortness of breath?: No  Endocrine Excessive thirst?: No  Musculoskeletal Back pain?: No Joint pain?: No  Neurological Headaches?: No Dizziness?: No  Psychologic Depression?: No Anxiety?: No  Physical Exam: BP 94/61 (BP Location: Left Arm, Patient Position: Sitting, Cohen Size: Normal)   Pulse 98   Ht 6\' 1"  (1.854 m)   Wt  161 lb 3.2 oz (73.1 kg)   BMI 21.27 kg/m   Constitutional:  Alert and oriented, No acute distress. HEENT: [place] AT, moist mucus membranes.  Trachea midline, no masses. Cardiovascular: No clubbing, cyanosis, or edema. Respiratory: Normal respiratory effort, no increased work of breathing. GI: Abdomen is soft, nontender, nondistended, no abdominal masses GU: No CVA tenderness Lymph: No cervical or inguinal lymphadenopathy. Skin: No rashes, bruises or suspicious lesions. Neurologic: Grossly intact, no focal deficits, moving all 4 extremities. Psychiatric: Normal mood and affect.   Assessment & Plan:   63 year old male with a stable 15 mm enhancing left renal mass.  We again reviewed this is a solid lesion and may represent a low-grade renal carcinoma.  He has elected to continue surveillance and I have recommended a follow-up renal ultrasound in 9 months.   Abbie Sons, [place] [address] [place], [place] [address] [phone]

## 2018-05-04 ENCOUNTER — Encounter: Payer: Self-pay | Admitting: Urology

## 2018-05-11 ENCOUNTER — Ambulatory Visit: Payer: Medicare HMO | Admitting: Family Medicine

## 2018-05-25 DIAGNOSIS — E041 Nontoxic single thyroid nodule: Secondary | ICD-10-CM | POA: Diagnosis not present

## 2018-05-26 DIAGNOSIS — E042 Nontoxic multinodular goiter: Secondary | ICD-10-CM | POA: Diagnosis not present

## 2018-06-07 ENCOUNTER — Other Ambulatory Visit: Payer: Self-pay | Admitting: Internal Medicine

## 2018-06-07 DIAGNOSIS — E0510 Thyrotoxicosis with toxic single thyroid nodule without thyrotoxic crisis or storm: Secondary | ICD-10-CM

## 2018-06-15 ENCOUNTER — Other Ambulatory Visit: Payer: Self-pay

## 2018-06-15 ENCOUNTER — Encounter: Payer: Self-pay | Admitting: Family Medicine

## 2018-06-15 ENCOUNTER — Ambulatory Visit (INDEPENDENT_AMBULATORY_CARE_PROVIDER_SITE_OTHER): Payer: Medicare HMO | Admitting: Family Medicine

## 2018-06-15 VITALS — BP 106/67 | HR 78 | Temp 97.8°F | Ht 73.0 in | Wt 158.0 lb

## 2018-06-15 DIAGNOSIS — E0590 Thyrotoxicosis, unspecified without thyrotoxic crisis or storm: Secondary | ICD-10-CM | POA: Diagnosis not present

## 2018-06-15 DIAGNOSIS — E7800 Pure hypercholesterolemia, unspecified: Secondary | ICD-10-CM

## 2018-06-15 MED ORDER — ATORVASTATIN CALCIUM 20 MG PO TABS: 20.0000 mg | ORAL_TABLET | Freq: Every day | ORAL | 1 refills | Status: DC

## 2018-06-15 NOTE — Progress Notes (Signed)
BP 106/67   Pulse 78   Temp 97.8 F (36.6 C) (Oral)   Ht 6\' 1"  (1.854 m)   Wt 158 lb (71.7 kg)   BMI 20.85 kg/m    Subjective:    Patient ID: Tyler Cohen, male    DOB: March 31, 1956, 63 y.o.   MRN: [id number]  HPI: TIAN MCMURTREY is a 63 y.o. male  Chief Complaint  Patient presents with  . Follow-up    62m   HYPERTHYROIDISM Thyroid control status:unknown- has not seen endocrine. Due to see them on 3/23 and have NM scan Satisfied with current treatment? no Medication side effects: not on anything Fatigue: no Cold intolerance: no Heat intolerance: no Weight gain: no Weight loss: no Constipation: no Diarrhea/loose stools: no Palpitations: no Lower extremity edema: no Anxiety/depressed mood: no  HYPERLIPIDEMIA Hyperlipidemia status: Stable Satisfied with current treatment?  yes Side effects:  no Medication compliance: good compliance Past cholesterol meds: atorvastatin Supplements: none Aspirin:  yes The 10-year ASCVD risk score Mikey Bussing DC Jr., et al., 2013) is: 10.3%   Values used to calculate the score:     Age: 21 years     Sex: Male     Is Non-Hispanic African American: Yes     Diabetic: No     Tobacco smoker: Yes     Systolic Blood Pressure: 761 mmHg     Is BP treated: No     HDL Cholesterol: 51 mg/dL     Total Cholesterol: 174 mg/dL Chest pain:  no   Relevant past medical, surgical, family and social history reviewed and updated as indicated. Interim medical history since our last visit reviewed. Allergies and medications reviewed and updated.  Review of Systems  Constitutional: Negative.   Respiratory: Negative.   Cardiovascular: Negative.   Neurological: Negative.   Psychiatric/Behavioral: Negative.     Per HPI unless specifically indicated above     Objective:    BP 106/67   Pulse 78   Temp 97.8 F (36.6 C) (Oral)   Ht 6\' 1"  (1.854 m)   Wt 158 lb (71.7 kg)   BMI 20.85 kg/m   Wt Readings from Last 3 Encounters:  06/15/18 158 lb  (71.7 kg)  05/03/18 161 lb 3.2 oz (73.1 kg)  04/30/18 165 lb (74.8 kg)    Physical Exam Vitals signs and nursing note reviewed.  Constitutional:      General: He is not in acute distress.    Appearance: Normal appearance. He is not ill-appearing, toxic-appearing or diaphoretic.  HENT:     Head: Normocephalic and atraumatic.     Right Ear: External ear normal.     Left Ear: External ear normal.     Nose: Nose normal.     Mouth/Throat:     Mouth: Mucous membranes are moist.     Pharynx: Oropharynx is clear.  Eyes:     General: No scleral icterus.       Right eye: No discharge.        Left eye: No discharge.     Extraocular Movements: Extraocular movements intact.     Conjunctiva/sclera: Conjunctivae normal.     Pupils: Pupils are equal, round, and reactive to light.  Neck:     Musculoskeletal: Normal range of motion and neck supple.  Cardiovascular:     Rate and Rhythm: Normal rate and regular rhythm.     Pulses: Normal pulses.     Heart sounds: Normal heart sounds. No murmur. No friction rub. No gallop.  Pulmonary:     Effort: Pulmonary effort is normal. No respiratory distress.     Breath sounds: Normal breath sounds. No stridor. No wheezing, rhonchi or rales.  Chest:     Chest wall: No tenderness.  Musculoskeletal: Normal range of motion.  Skin:    General: Skin is warm and dry.     Capillary Refill: Capillary refill takes less than 2 seconds.     Coloration: Skin is not jaundiced or pale.     Findings: No bruising, erythema, lesion or rash.  Neurological:     General: No focal deficit present.     Mental Status: He is alert and oriented to person, place, and time. Mental status is at baseline.  Psychiatric:        Mood and Affect: Mood normal.        Behavior: Behavior normal.        Thought Content: Thought content normal.        Judgment: Judgment normal.     Results for orders placed or performed in visit on 03/10/18  Thyroid Panel With TSH  Result Value  Ref Range   TSH <0.006 (L) 0.450 - 4.500 uIU/mL   T4, Total 11.6 4.5 - 12.0 ug/dL   T3 Uptake Ratio 31 24 - 39 %   Free Thyroxine Index 3.6 1.2 - 4.9      Assessment & Plan:   Problem List Items Addressed This Visit    None       Follow up plan: No follow-ups on file.

## 2018-06-15 NOTE — Assessment & Plan Note (Signed)
Due to see endocrinology on 3/23, notes that he has not seen them yet. Will recheck his thyroid today. Call with any concerns. Continue to monitor.

## 2018-06-15 NOTE — Assessment & Plan Note (Signed)
Under good control on current regimen. Continue current regimen. Continue to monitor. Call with any concerns. Refills given. Labs checked today.  

## 2018-06-30 ENCOUNTER — Other Ambulatory Visit: Payer: Self-pay

## 2018-06-30 ENCOUNTER — Ambulatory Visit
Admission: RE | Admit: 2018-06-30 | Discharge: 2018-06-30 | Disposition: A | Discharge status: Home / Self Care | Payer: Medicare HMO | Source: Ambulatory Visit | Attending: Internal Medicine | Admitting: Internal Medicine

## 2018-06-30 DIAGNOSIS — E0510 Thyrotoxicosis with toxic single thyroid nodule without thyrotoxic crisis or storm: Secondary | ICD-10-CM | POA: Insufficient documentation

## 2018-06-30 DIAGNOSIS — E041 Nontoxic single thyroid nodule: Secondary | ICD-10-CM | POA: Diagnosis not present

## 2018-06-30 MED ORDER — SODIUM IODIDE I 131 CAPSULE
29.1600 | Freq: Once | INTRAVENOUS | Status: AC | PRN
  Administered 2018-06-30: 29.16 via ORAL

## 2018-07-31 ENCOUNTER — Telehealth: Payer: Self-pay

## 2018-07-31 NOTE — Telephone Encounter (Signed)
Copied from [place] [id number]. Topic: Referral - Status >> Jul 31, 2018 91:44 AM Simone Curia D wrote: [birth year]35 Spoke with patient about Medicare Extrahelp, [employer]-Pay Relief Program. Both accept applications over the phone.MA

## 2018-08-15 DIAGNOSIS — E0510 Thyrotoxicosis with toxic single thyroid nodule without thyrotoxic crisis or storm: Secondary | ICD-10-CM | POA: Diagnosis not present

## 2018-08-18 DIAGNOSIS — E0520 Thyrotoxicosis with toxic multinodular goiter without thyrotoxic crisis or storm: Secondary | ICD-10-CM | POA: Diagnosis not present

## 2018-08-22 ENCOUNTER — Encounter: Payer: Self-pay | Admitting: Family Medicine

## 2018-08-22 ENCOUNTER — Ambulatory Visit (INDEPENDENT_AMBULATORY_CARE_PROVIDER_SITE_OTHER): Payer: Medicare HMO | Admitting: Family Medicine

## 2018-08-22 ENCOUNTER — Other Ambulatory Visit: Payer: Self-pay

## 2018-08-22 VITALS — BP 103/67 | HR 98 | Temp 98.1°F | Ht 73.0 in | Wt 155.0 lb

## 2018-08-22 DIAGNOSIS — E7800 Pure hypercholesterolemia, unspecified: Secondary | ICD-10-CM | POA: Diagnosis not present

## 2018-08-22 DIAGNOSIS — E0590 Thyrotoxicosis, unspecified without thyrotoxic crisis or storm: Secondary | ICD-10-CM | POA: Diagnosis not present

## 2018-08-22 DIAGNOSIS — R55 Syncope and collapse: Secondary | ICD-10-CM

## 2018-08-22 DIAGNOSIS — R062 Wheezing: Secondary | ICD-10-CM

## 2018-08-22 LAB — CBC WITH DIFFERENTIAL/PLATELET
Hematocrit: 38.6 % (ref 37.5–51.0)
Hemoglobin: 13.2 g/dL (ref 13.0–17.7)
Lymphocytes Absolute: 1.6 10*3/uL (ref 0.7–3.1)
Lymphs: 31 %
MCH: 33.2 pg — ABNORMAL HIGH (ref 26.6–33.0)
MCHC: 34.2 g/dL (ref 31.5–35.7)
MCV: 97 fL (ref 79–97)
MID (Absolute): 0.5 10*3/uL (ref 0.1–1.6)
MID: 10 %
Neutrophils Absolute: 2.9 10*3/uL (ref 1.4–7.0)
Neutrophils: 59 %
Platelets: 172 10*3/uL (ref 150–450)
RBC: 3.98 x10E6/uL — ABNORMAL LOW (ref 4.14–5.80)
RDW: 12.8 % (ref 11.6–15.4)
WBC: 5 10*3/uL (ref 3.4–10.8)

## 2018-08-22 LAB — BAYER DCA HB A1C WAIVED: HB A1C (BAYER DCA - WAIVED): 5.4 % (ref <7.0)

## 2018-08-22 MED ORDER — ALBUTEROL SULFATE HFA 108 (90 BASE) MCG/ACT IN AERS: 2.0000 | INHALATION_SPRAY | Freq: Four times a day (QID) | RESPIRATORY_TRACT | 0 refills | Status: DC | PRN

## 2018-08-22 NOTE — Patient Instructions (Signed)
Near-Syncope Near-syncope is when you suddenly become weak or dizzy, or you feel like you might pass out (faint). During an episode of near-syncope, you may:  Feel dizzy or light-headed.  Feel nauseous.  See all white or all black in your field of vision.  Have cold, clammy skin. This condition is caused by a sudden decrease in blood flow to the brain. This decrease can result from various causes, but most of those causes are not dangerous. However, near-syncope can be a sign of a serious medical problem, so it is important to seek medical care. If you fainted, get medical help right away.Call your local emergency services (911 in the [place] Do not drive yourself to the hospital. Follow these instructions at home: Pay attention to any changes in your symptoms. Take these actions to help with your condition:  Have someone stay with you until you feel stable.  Do not drive, use machinery, or play sports until your health care provider says it is okay.  Keep all follow-up visits as told by your health care provider. This is important.  If you start to feel like you might faint, lie down right away and raise (elevate) your feet above the level of your heart. Breathe deeply and steadily. Wait until all of the symptoms have passed.  Drink enough fluid to keep your urine clear or pale yellow.  If you are taking blood pressure or heart medicine, get up slowly and take several minutes to sit and then stand. This can reduce dizziness.  Take over-the-counter and prescription medicines only as told by your health care provider. Get help right away if:  You have a severe headache.  You have unusual pain in your chest, abdomen, or back.  You are bleeding from your mouth or rectum, or you have black or tarry stool.  You have a very fast or irregular heartbeat (palpitations).  You faint once or repeatedly.  You have a seizure.  You are confused.  You have trouble walking.  You have  severe weakness.  You have vision problems. These symptoms may represent a serious problem that is an emergency. Do not wait to see if your symptoms will go away. Get medical help right away. Call your local emergency services (911 in the [place] Do not drive yourself to the hospital. This information is not intended to replace advice given to you by your health care provider. Make sure you discuss any questions you have with your health care provider. Document Released: 03/22/2005 Document Revised: 05/04/2016 Document Reviewed: 12/04/2014 Elsevier Interactive Patient Education  2019 [employer].

## 2018-08-22 NOTE — Progress Notes (Signed)
BP 103/67   Pulse 98   Temp 98.1 F (36.7 C) (Oral)   Ht 6\' 1"  (1.854 m)   Wt 155 lb (70.3 kg)   SpO2 95%   BMI 20.45 kg/m    Subjective:    Patient ID: Tyler Cohen, male    DOB: 12/26/1955, 63 y.o.   MRN: [id number]  HPI: Tyler Cohen is a 63 y.o. male  Chief Complaint  Patient presents with  . Dizziness    since Sunday  . Fatigue   Saw Dr. Gabriel Carina 4 days ago and started on methimazole for persistent hyperthyroidism after radioactive iodine treatment.   DIZZINESS/NEAR SYNCOPE Duration: 3 days ago Description of symptoms: near syncope, dizziness, light headed, room went dark- sat down and it went away, didn't eat anything for breakfast that AM, drank a little bit of water when he took his meds in the AM, nothing else Duration of episode: 30 minutes Dizziness frequency: 1x Provoking factors: none Aggravating factors:  none Triggered by rolling over in bed: no Triggered by bending over: no Aggravated by head movement: no Aggravated by exertion, coughing, loud noises: no Recent head injury: no Recent or current viral symptoms: no History of vasovagal episodes: no Nausea: yes Vomiting: no Tinnitus: no Hearing loss: no Aural fullness: no Headache: no Photophobia/phonophobia: no Unsteady gait: no Postural instability: no Diplopia, dysarthria, dysphagia or weakness: no Related to exertion: no Pallor: no Diaphoresis: no Dyspnea: no Chest pain: no   Relevant past medical, surgical, family and social history reviewed and updated as indicated. Interim medical history since our last visit reviewed. Allergies and medications reviewed and updated.  Review of Systems  Constitutional: Positive for fatigue. Negative for activity change, appetite change, chills, diaphoresis, fever and unexpected weight change.  HENT: Negative.   Respiratory: Positive for wheezing. Negative for apnea, cough, choking, chest tightness, shortness of breath and stridor.    Cardiovascular: Negative.   Gastrointestinal: Negative.   Musculoskeletal: Negative.   Neurological: Positive for dizziness, weakness and light-headedness. Negative for tremors, seizures, syncope, facial asymmetry, speech difficulty, numbness and headaches.  Psychiatric/Behavioral: Negative.     Per HPI unless specifically indicated above     Objective:    BP 103/67   Pulse 98   Temp 98.1 F (36.7 C) (Oral)   Ht 6\' 1"  (1.854 m)   Wt 155 lb (70.3 kg)   SpO2 95%   BMI 20.45 kg/m   Wt Readings from Last 3 Encounters:  08/22/18 155 lb (70.3 kg)  06/15/18 158 lb (71.7 kg)  05/03/18 161 lb 3.2 oz (73.1 kg)   Orthostatic VS for the past 24 hrs:  BP- Lying Pulse- Lying BP- Sitting Pulse- Sitting BP- Standing at 0 minutes Pulse- Standing at 0 minutes  08/22/18 0905 106/67 84 109/72 89 108/70 92     Physical Exam Vitals signs and nursing note reviewed.  Constitutional:      General: He is not in acute distress.    Appearance: Normal appearance. He is not ill-appearing, toxic-appearing or diaphoretic.  HENT:     Head: Normocephalic and atraumatic.     Right Ear: External ear normal.     Left Ear: External ear normal.     Nose: Nose normal.     Mouth/Throat:     Mouth: Mucous membranes are moist.     Pharynx: Oropharynx is clear.  Eyes:     General: No scleral icterus.       Right eye: No discharge.  Left eye: No discharge.     Extraocular Movements: Extraocular movements intact.     Conjunctiva/sclera: Conjunctivae normal.     Pupils: Pupils are equal, round, and reactive to light.  Neck:     Musculoskeletal: Normal range of motion and neck supple.  Cardiovascular:     Rate and Rhythm: Normal rate and regular rhythm.     Pulses: Normal pulses.     Heart sounds: Normal heart sounds. No murmur. No friction rub. No gallop.   Pulmonary:     Effort: Pulmonary effort is normal. No respiratory distress.     Breath sounds: No stridor. Wheezing present. No rhonchi or  rales.  Chest:     Chest wall: No tenderness.  Musculoskeletal: Normal range of motion.  Skin:    General: Skin is warm and dry.     Capillary Refill: Capillary refill takes less than 2 seconds.     Coloration: Skin is not jaundiced or pale.     Findings: No bruising, erythema, lesion or rash.  Neurological:     General: No focal deficit present.     Mental Status: He is alert and oriented to person, place, and time. Mental status is at baseline.  Psychiatric:        Mood and Affect: Mood normal.        Behavior: Behavior normal.        Thought Content: Thought content normal.        Judgment: Judgment normal.     Results for orders placed or performed in visit on 03/10/18  Thyroid Panel With TSH  Result Value Ref Range   TSH <0.006 (L) 0.450 - 4.500 uIU/mL   T4, Total 11.6 4.5 - 12.0 ug/dL   T3 Uptake Ratio 31 24 - 39 %   Free Thyroxine Index 3.6 1.2 - 4.9      Assessment & Plan:   Problem List Items Addressed This Visit    None    Visit Diagnoses    Near syncope    -  Primary   1x. Possibly due to methimazole in combination with not eating. Normal EKG/HGB/A1c/Orthostatics. Await other labs. Eat breakfast. Increase fluids.    Relevant Orders   CBC With Differential/Platelet   Comprehensive metabolic panel   TSH   Bayer DCA Hb A1c Waived   EKG 12-Lead (Completed)   Wheezing       Will start albuterol and recheck at follow up.       Follow up plan: Return 4-8 weeks, for physical/wellness.   25 minutes spent in counseling and coordination of care with patient today.

## 2018-08-23 ENCOUNTER — Encounter: Payer: Self-pay | Admitting: Family Medicine

## 2018-08-23 LAB — COMPREHENSIVE METABOLIC PANEL
ALT: 18 IU/L (ref 0–44)
AST: 15 IU/L (ref 0–40)
Albumin/Globulin Ratio: 1.5 (ref 1.2–2.2)
Albumin: 3.7 g/dL — ABNORMAL LOW (ref 3.8–4.8)
Alkaline Phosphatase: 74 IU/L (ref 39–117)
BUN/Creatinine Ratio: 18 (ref 10–24)
BUN: 16 mg/dL (ref 8–27)
Bilirubin Total: 0.6 mg/dL (ref 0.0–1.2)
CO2: 21 mmol/L (ref 20–29)
Calcium: 9.3 mg/dL (ref 8.6–10.2)
Chloride: 107 mmol/L — ABNORMAL HIGH (ref 96–106)
Creatinine, Ser: 0.87 mg/dL (ref 0.76–1.27)
GFR calc Af Amer: 107 mL/min/{1.73_m2} (ref >59)
GFR calc non Af Amer: 92 mL/min/{1.73_m2} (ref >59)
Globulin, Total: 2.4 g/dL (ref 1.5–4.5)
Glucose: 97 mg/dL (ref 65–99)
Potassium: 4.3 mmol/L (ref 3.5–5.2)
Sodium: 137 mmol/L (ref 134–144)
Total Protein: 6.1 g/dL (ref 6.0–8.5)

## 2018-08-23 LAB — TSH: TSH: <0.006 u[IU]/mL — ABNORMAL LOW (ref 0.450–4.500)

## 2018-09-15 ENCOUNTER — Encounter: Payer: Medicare HMO | Admitting: Family Medicine

## 2018-10-23 DIAGNOSIS — E0520 Thyrotoxicosis with toxic multinodular goiter without thyrotoxic crisis or storm: Secondary | ICD-10-CM | POA: Diagnosis not present

## 2018-10-27 ENCOUNTER — Encounter: Payer: Self-pay | Admitting: Family Medicine

## 2018-10-27 ENCOUNTER — Ambulatory Visit (INDEPENDENT_AMBULATORY_CARE_PROVIDER_SITE_OTHER): Payer: Medicare Other | Admitting: Family Medicine

## 2018-10-27 ENCOUNTER — Other Ambulatory Visit: Payer: Self-pay

## 2018-10-27 VITALS — BP 137/84 | HR 97 | Temp 97.9°F | Ht 73.0 in | Wt 147.6 lb

## 2018-10-27 DIAGNOSIS — Z13220 Encounter for screening for lipoid disorders: Secondary | ICD-10-CM

## 2018-10-27 DIAGNOSIS — E7800 Pure hypercholesterolemia, unspecified: Secondary | ICD-10-CM

## 2018-10-27 DIAGNOSIS — N401 Enlarged prostate with lower urinary tract symptoms: Secondary | ICD-10-CM

## 2018-10-27 DIAGNOSIS — R350 Frequency of micturition: Secondary | ICD-10-CM

## 2018-10-27 DIAGNOSIS — R3911 Hesitancy of micturition: Secondary | ICD-10-CM | POA: Diagnosis not present

## 2018-10-27 DIAGNOSIS — E0590 Thyrotoxicosis, unspecified without thyrotoxic crisis or storm: Secondary | ICD-10-CM

## 2018-10-27 DIAGNOSIS — N400 Enlarged prostate without lower urinary tract symptoms: Secondary | ICD-10-CM | POA: Insufficient documentation

## 2018-10-27 MED ORDER — ALBUTEROL SULFATE HFA 108 (90 BASE) MCG/ACT IN AERS: 2.0000 | INHALATION_SPRAY | Freq: Four times a day (QID) | RESPIRATORY_TRACT | 6 refills | Status: DC | PRN

## 2018-10-27 MED ORDER — TAMSULOSIN HCL 0.4 MG PO CAPS: 0.4000 mg | ORAL_CAPSULE | Freq: Every day | ORAL | 3 refills | Status: DC

## 2018-10-27 MED ORDER — ATORVASTATIN CALCIUM 20 MG PO TABS: 20.0000 mg | ORAL_TABLET | Freq: Every day | ORAL | 1 refills | Status: DC

## 2018-10-27 NOTE — Assessment & Plan Note (Signed)
Under good control on current regimen. Continue current regimen. Continue to monitor. Call with any concerns. Refills given today. Call with any concerns.

## 2018-10-27 NOTE — Patient Instructions (Addendum)
Call for your bone density to make sure your bones are strong:  Address: [address], [place], [place] [address]  Phone: [phone]  Preventative Services:  Health Risk Assessment and Personalized Prevention Plan: Done today Bone Mass Measurements: Ordered today CVD Screening: Done today Colon Cancer Screening: Up to date Depression Screening: Done today Diabetes Screening: Done today Glaucoma Screening: See your eye doctor Hepatitis B vaccine: N/A Hepatitis C screening: Up to date HIV Screening: Up to date Flu Vaccine: Get in the fall Lung cancer Screening: due in August Obesity Screening: Done today Pneumonia Vaccines (2): STI Screening: N/A PSA screening: Done today   Health Maintenance, Male Adopting a healthy lifestyle and getting preventive care are important in promoting health and wellness. Ask your health care provider about:  The right schedule for you to have regular tests and exams.  Things you can do on your own to prevent diseases and keep yourself healthy. What should I know about diet, weight, and exercise? Eat a healthy diet   Eat a diet that includes plenty of vegetables, fruits, low-fat dairy products, and lean protein.  Do not eat a lot of foods that are high in solid fats, added sugars, or sodium. Maintain a healthy weight Body mass index (BMI) is a measurement that can be used to identify possible weight problems. It estimates body fat based on height and weight. Your health care provider can help determine your BMI and help you achieve or maintain a healthy weight. Get regular exercise Get regular exercise. This is one of the most important things you can do for your health. Most adults should:  Exercise for at least 150 minutes each week. The exercise should increase your heart rate and make you sweat (moderate-intensity exercise).  Do strengthening exercises at least twice a week. This is in addition to the moderate-intensity  exercise.  Spend less time sitting. Even light physical activity can be beneficial. Watch cholesterol and blood lipids Have your blood tested for lipids and cholesterol at 63 years of age, then have this test every 5 years. You may need to have your cholesterol levels checked more often if:  Your lipid or cholesterol levels are high.  You are older than 63 years of age.  You are at high risk for heart disease. What should I know about cancer screening? Many types of cancers can be detected early and may often be prevented. Depending on your health history and family history, you may need to have cancer screening at various ages. This may include screening for:  Colorectal cancer.  Prostate cancer.  Skin cancer.  Lung cancer. What should I know about heart disease, diabetes, and high blood pressure? Blood pressure and heart disease  High blood pressure causes heart disease and increases the risk of stroke. This is more likely to develop in people who have high blood pressure readings, are of African descent, or are overweight.  Talk with your health care provider about your target blood pressure readings.  Have your blood pressure checked: ? Every 3-5 years if you are 54-71 years of age. ? Every year if you are 55 years old or older.  If you are between the ages of 11 and 86 and are a current or former smoker, ask your health care provider if you should have a one-time screening for abdominal aortic aneurysm (AAA). Diabetes Have regular diabetes screenings. This checks your fasting blood sugar level. Have the screening done:  Once every three years after age 74  if you are at a normal weight and have a low risk for diabetes.  More often and at a younger age if you are overweight or have a high risk for diabetes. What should I know about preventing infection? Hepatitis B If you have a higher risk for hepatitis B, you should be screened for this virus. Talk with your health care  provider to find out if you are at risk for hepatitis B infection. Hepatitis C Blood testing is recommended for:  Everyone born from [birth year] through 1965.  Anyone with known risk factors for hepatitis C. Sexually transmitted infections (STIs)  You should be screened each year for STIs, including gonorrhea and chlamydia, if: ? You are sexually active and are younger than 63 years of age. ? You are older than 63 years of age and your health care provider tells you that you are at risk for this type of infection. ? Your sexual activity has changed since you were last screened, and you are at increased risk for chlamydia or gonorrhea. Ask your health care provider if you are at risk.  Ask your health care provider about whether you are at high risk for HIV. Your health care provider may recommend a prescription medicine to help prevent HIV infection. If you choose to take medicine to prevent HIV, you should first get tested for HIV. You should then be tested every 3 months for as long as you are taking the medicine. Follow these instructions at home: Lifestyle  Do not use any products that contain nicotine or tobacco, such as cigarettes, e-cigarettes, and chewing tobacco. If you need help quitting, ask your health care provider.  Do not use street drugs.  Do not share needles.  Ask your health care provider for help if you need support or information about quitting drugs. Alcohol use  Do not drink alcohol if your health care provider tells you not to drink.  If you drink alcohol: ? Limit how much you have to 0-2 drinks a day. ? Be aware of how much alcohol is in your drink. In the [place] one drink equals one 12 oz bottle of beer (355 mL), one 5 oz glass of wine (148 mL), or one 1 oz glass of hard liquor (44 mL). General instructions  Schedule regular health, dental, and eye exams.  Stay current with your vaccines.  Tell your health care provider if: ? You often feel depressed. ? You  have ever been abused or do not feel safe at home. Summary  Adopting a healthy lifestyle and getting preventive care are important in promoting health and wellness.  Follow your health care provider's instructions about healthy diet, exercising, and getting tested or screened for diseases.  Follow your health care provider's instructions on monitoring your cholesterol and blood pressure. This information is not intended to replace advice given to you by your health care provider. Make sure you discuss any questions you have with your health care provider. Document Released: 09/18/2007 Document Revised: 03/15/2018 Document Reviewed: 03/15/2018 Elsevier Patient Education  2020 [employer].

## 2018-10-27 NOTE — Assessment & Plan Note (Signed)
Encouraged patient to follow up with Dr. Gabriel Carina and to start his PTU. He is anxious about his liver function, so we will recheck his liver functions in 1 month while on it. Call with any concerns.

## 2018-10-27 NOTE — Progress Notes (Signed)
BP 137/84   Pulse 97   Temp 97.9 F (36.6 C) (Oral)   Ht 6\' 1"  (1.854 m)   Wt 147 lb 9.6 oz (67 kg)   SpO2 100%   BMI 19.47 kg/m    Subjective:    Patient ID: Tyler Cohen, male    DOB: 10/28/1955, 63 y.o.   MRN: [id number]  HPI: Tyler Cohen is a 63 y.o. male presenting on 10/27/2018 for comprehensive medical examination. Current medical complaints include:  HYPERTHYROIDISM Thyroid control status:uncontrolled Satisfied with current treatment? no Medication side effects: no Medication compliance: poor compliance Recent dose adjustment:yes- Dr. Gabriel Carina just started him on PTU, and he has not taken it Fatigue: no Cold intolerance: no Heat intolerance: no Weight gain: no Weight loss: yes Constipation: no Diarrhea/loose stools: no Palpitations: no Lower extremity edema: no Anxiety/depressed mood: no  HYPERLIPIDEMIA Hyperlipidemia status: excellent compliance Satisfied with current treatment?  yes Side effects:  no Medication compliance: excellent compliance Past cholesterol meds: atorvastatin Supplements: none Aspirin:  no The 10-year ASCVD risk score Mikey Bussing DC Jr., et al., 2013) is: 15.9%   Values used to calculate the score:     Age: 59 years     Sex: Male     Is Non-Hispanic African American: Yes     Diabetic: No     Tobacco smoker: Yes     Systolic Blood Pressure: 409 mmHg     Is BP treated: No     HDL Cholesterol: 51 mg/dL     Total Cholesterol: 174 mg/dL Chest pain:  no Coronary artery disease:  no  BPH BPH status: uncontrolled Satisfied with current treatment?: no Medication side effects: not on anything Duration: months Nocturia: 3-4x per night Urinary frequency:yes Incomplete voiding: yes Urgency: yes Weak urinary stream: no Straining to start stream: no Dysuria: no Onset: gradual Severity: mild  He currently lives with: wife Interim Problems from his last visit: no  Functional Status Survey: Is the patient deaf or have difficulty  hearing?: No Does the patient have difficulty seeing, even when wearing glasses/contacts?: No Does the patient have difficulty concentrating, remembering, or making decisions?: No Does the patient have difficulty walking or climbing stairs?: No Does the patient have difficulty dressing or bathing?: No Does the patient have difficulty doing errands alone such as visiting a doctor's office or shopping?: No  FALL RISK: Fall Risk  10/27/2018 11/07/2017 10/14/2016 10/15/2015  Falls in the past year? 0 No No No  Number falls in past yr: 0 - - -  Injury with Fall? 0 - - -  Follow up Falls evaluation completed - - -    Depression Screen Depression screen [facility] 2/9 10/27/2018 11/08/2017 11/07/2017 10/14/2016 10/15/2015  Decreased Interest 0 0 0 0 0  Down, Depressed, Hopeless 0 0 0 0 0  PHQ - 2 Score 0 0 0 0 0  Altered sleeping 1 0 - - -  Tired, decreased energy 0 0 - - -  Change in appetite 1 0 - - -  Feeling bad or failure about yourself  0 0 - - -  Trouble concentrating 0 0 - - -  Moving slowly or fidgety/restless 0 0 - - -  Suicidal thoughts 0 0 - - -  PHQ-9 Score 2 0 - - -  Difficult doing work/chores Not difficult at all - - - -    Advanced Directives <no information>  Past Medical History:  Past Medical History:  Diagnosis Date  . Arthritis   .  Heart murmur   . Hyperlipidemia   . Macrocytosis   . Thyroid disease   . Tobacco abuse     Surgical History:  Past Surgical History:  Procedure Laterality Date  . COLONOSCOPY WITH PROPOFOL N/A 12/01/2015   Procedure: COLONOSCOPY WITH PROPOFOL;  Surgeon: Lucilla Lame, MD;  Location: [facility];  Service: Endoscopy;  Laterality: N/A;  . POLYPECTOMY  12/01/2015   Procedure: POLYPECTOMY;  Surgeon: Lucilla Lame, MD;  Location: [place];  Service: Endoscopy;;    Medications:  Current Outpatient Medications on File Prior to Visit  Medication Sig  . aspirin 81 MG tablet Take 81 mg by mouth daily.  Marland Kitchen b complex vitamins tablet  Take 1 tablet by mouth daily.  . calcipotriene (DOVONOX) 0.005 % cream Apply topically 2 (two) times daily.  . diflorasone (PSORCON) 0.05 % ointment   . econazole nitrate 1 % cream   . methimazole (TAPAZOLE) 10 MG tablet Take by mouth.   No current facility-administered medications on file prior to visit.     Allergies:  No Known Allergies  Social History:  Social History   Socioeconomic History  . Marital status: Married    Spouse name: Not on file  . Number of children: Not on file  . Years of education: Not on file  . Highest education level: High school graduate  Occupational History  . Not on file  Social Needs  . Financial resource strain: Not hard at all  . Food insecurity    Worry: Never true    Inability: Never true  . Transportation needs    Medical: No    Non-medical: No  Tobacco Use  . Smoking status: Current Every Day Smoker    Packs/day: 0.25    Years: 42.00    Pack years: 10.50    Types: Cigarettes  . Smokeless tobacco: Never Used  . Tobacco comment: 1 pack in 4 days   Substance and Sexual Activity  . Alcohol use: No  . Drug use: Yes    Types: Marijuana    Comment: pt states he smokes every once in a while  . Sexual activity: Not on file  Lifestyle  . Physical activity    Days per week: 0 days    Minutes per session: 0 min  . Stress: Not at all  Relationships  . Social Herbalist on phone: Twice a week    Gets together: Twice a week    Attends religious service: More than 4 times per year    Active member of club or organization: No    Attends meetings of clubs or organizations: Never    Relationship status: Married  . Intimate partner violence    Fear of current or ex partner: No    Emotionally abused: No    Physically abused: No    Forced sexual activity: No  Other Topics Concern  . Not on file  Social History Narrative   Works part time.   Social History   Tobacco Use  Smoking Status Current Every Day Smoker  .  Packs/day: 0.25  . Years: 42.00  . Pack years: 10.50  . Types: Cigarettes  Smokeless Tobacco Never Used  Tobacco Comment   1 pack in 4 days    Social History   Substance and Sexual Activity  Alcohol Use No    Family History:  Family History  Problem Relation Age of Onset  . Hypertension Mother   . Diabetes Mother  lost both legs  . Kidney disease Mother   . Hypertension Father   . Emphysema Father   . Sickle cell trait Father   . Hypertension Brother   . Hyperlipidemia Brother   . Sickle cell trait Sister     Past medical history, surgical history, medications, allergies, family history and social history reviewed with patient today and changes made to appropriate areas of the chart.   Review of Systems  Constitutional: Positive for diaphoresis and weight loss. Negative for chills, fever and malaise/fatigue.  HENT: Negative.   Eyes: Negative.   Respiratory: Positive for cough. Negative for hemoptysis, sputum production, shortness of breath and wheezing.   Cardiovascular: Negative.   Gastrointestinal: Negative.   Genitourinary: Negative.        +hesitancy  Musculoskeletal: Negative.   Skin: Negative.   Neurological: Negative.   Endo/Heme/Allergies: Positive for polydipsia. Negative for environmental allergies. Does not bruise/bleed easily.  Psychiatric/Behavioral: Negative.     All other ROS negative except what is listed above and in the HPI.      Objective:    BP 137/84   Pulse 97   Temp 97.9 F (36.6 C) (Oral)   Ht 6\' 1"  (1.854 m)   Wt 147 lb 9.6 oz (67 kg)   SpO2 100%   BMI 19.47 kg/m   Wt Readings from Last 3 Encounters:  10/27/18 147 lb 9.6 oz (67 kg)  08/22/18 155 lb (70.3 kg)  06/15/18 158 lb (71.7 kg)    Physical Exam Vitals signs and nursing note reviewed.  Constitutional:      General: He is not in acute distress.    Appearance: Normal appearance. He is obese. He is not ill-appearing, toxic-appearing or diaphoretic.  HENT:      Head: Normocephalic and atraumatic.     Right Ear: Tympanic membrane, ear canal and external ear normal. There is no impacted cerumen.     Left Ear: Tympanic membrane, ear canal and external ear normal. There is no impacted cerumen.     Nose: Nose normal. No congestion or rhinorrhea.     Mouth/Throat:     Mouth: Mucous membranes are moist.     Pharynx: Oropharynx is clear. No oropharyngeal exudate or posterior oropharyngeal erythema.  Eyes:     General: No scleral icterus.       Right eye: No discharge.        Left eye: No discharge.     Extraocular Movements: Extraocular movements intact.     Conjunctiva/sclera: Conjunctivae normal.     Pupils: Pupils are equal, round, and reactive to light.  Neck:     Musculoskeletal: Normal range of motion and neck supple. No neck rigidity or muscular tenderness.     Vascular: No carotid bruit.  Cardiovascular:     Rate and Rhythm: Normal rate and regular rhythm.     Pulses: Normal pulses.     Heart sounds: No murmur. No friction rub. No gallop.   Pulmonary:     Effort: Pulmonary effort is normal. No respiratory distress.     Breath sounds: Normal breath sounds. No stridor. No wheezing, rhonchi or rales.  Chest:     Chest wall: No tenderness.  Abdominal:     General: Abdomen is flat. Bowel sounds are normal. There is no distension.     Palpations: Abdomen is soft. There is no mass.     Tenderness: There is no abdominal tenderness. There is no right CVA tenderness, left CVA tenderness, guarding or rebound.  Hernia: No hernia is present.  Genitourinary:    Comments: Genital exam deferred with shared decision making Musculoskeletal:        General: No swelling, tenderness, deformity or signs of injury.     Right lower leg: No edema.     Left lower leg: No edema.  Lymphadenopathy:     Cervical: No cervical adenopathy.  Skin:    General: Skin is warm and dry.     Capillary Refill: Capillary refill takes less than 2 seconds.     Coloration:  Skin is not jaundiced or pale.     Findings: No bruising, erythema, lesion or rash.  Neurological:     General: No focal deficit present.     Mental Status: He is alert and oriented to person, place, and time.     Cranial Nerves: No cranial nerve deficit.     Sensory: No sensory deficit.     Motor: No weakness.     Coordination: Coordination normal.     Gait: Gait normal.     Deep Tendon Reflexes: Reflexes normal.  Psychiatric:        Mood and Affect: Mood normal.        Behavior: Behavior normal.        Thought Content: Thought content normal.        Judgment: Judgment normal.     6CIT Screen 10/27/2018 11/07/2017 10/14/2016  What Year? 0 points 0 points 0 points  What month? 0 points 0 points 0 points  What time? 0 points 0 points 0 points  Count back from 20 0 points 0 points 0 points  Months in reverse 0 points 0 points 0 points  Repeat phrase 4 points 0 points 0 points  Total Score 4 0 0     Results for orders placed or performed in visit on 08/22/18  CBC With Differential/Platelet  Result Value Ref Range   WBC 5.0 3.4 - 10.8 x10E3/uL   RBC 3.98 (L) 4.14 - 5.80 x10E6/uL   Hemoglobin 13.2 13.0 - 17.7 g/dL   Hematocrit 38.6 37.5 - 51.0 %   MCV 97 79 - 97 fL   MCH 33.2 (H) 26.6 - 33.0 pg   MCHC 34.2 31.5 - 35.7 g/dL   RDW 12.8 11.6 - 15.4 %   Platelets 172 150 - 450 x10E3/uL   Neutrophils 59 Not Estab. %   Lymphs 31 Not Estab. %   MID 10 Not Estab. %   Neutrophils Absolute 2.9 1.4 - 7.0 x10E3/uL   Lymphocytes Absolute 1.6 0.7 - 3.1 x10E3/uL   MID (Absolute) 0.5 0.1 - 1.6 X10E3/uL  Comprehensive metabolic panel  Result Value Ref Range   Glucose 97 65 - 99 mg/dL   BUN 16 8 - 27 mg/dL   Creatinine, Ser 0.87 0.76 - 1.27 mg/dL   GFR calc non Af Amer 92 >59 mL/min/1.73   GFR calc Af Amer 107 >59 mL/min/1.73   BUN/Creatinine Ratio 18 10 - 24   Sodium 137 134 - 144 mmol/L   Potassium 4.3 3.5 - 5.2 mmol/L   Chloride 107 (H) 96 - 106 mmol/L   CO2 21 20 - 29 mmol/L    Calcium 9.3 8.6 - 10.2 mg/dL   Total Protein 6.1 6.0 - 8.5 g/dL   Albumin 3.7 (L) 3.8 - 4.8 g/dL   Globulin, Total 2.4 1.5 - 4.5 g/dL   Albumin/Globulin Ratio 1.5 1.2 - 2.2   Bilirubin Total 0.6 0.0 - 1.2 mg/dL   Alkaline Phosphatase 74  39 - 117 IU/L   AST 15 0 - 40 IU/L   ALT 18 0 - 44 IU/L  TSH  Result Value Ref Range   TSH <0.006 (L) 0.450 - 4.500 uIU/mL  Bayer DCA Hb A1c Waived  Result Value Ref Range   HB A1C (BAYER DCA - WAIVED) 5.4 <7.0 %      Assessment & Plan:   Problem List Items Addressed This Visit      Endocrine   Hyperthyroidism - Primary    Encouraged patient to follow up with Dr. Gabriel Carina and to start his PTU. He is anxious about his liver function, so we will recheck his liver functions in 1 month while on it. Call with any concerns.       Relevant Orders   CBC with Differential/Platelet   TSH   DG Bone Density     Genitourinary   BPH (benign prostatic hyperplasia)    Will start him on flomax and recheck 1 month. Call with any concerns.       Relevant Medications   tamsulosin (FLOMAX) 0.4 MG CAPS capsule     Other   Hyperlipidemia    Under good control on current regimen. Continue current regimen. Continue to monitor. Call with any concerns. Refills given today. Call with any concerns.        Relevant Medications   atorvastatin (LIPITOR) 20 MG tablet   Other Relevant Orders   CBC with Differential/Platelet   Comprehensive metabolic panel   Lipid Panel w/o Chol/HDL Ratio    Other Visit Diagnoses    Screening for cholesterol level       Hesitancy       Relevant Orders   PSA   UA/M w/rflx Culture, Routine       Preventative Services:  Health Risk Assessment and Personalized Prevention Plan: Done today Bone Mass Measurements: Ordered today CVD Screening: Done today Colon Cancer Screening: Up to date Depression Screening: Done today Diabetes Screening: Done today Glaucoma Screening: See your eye doctor Hepatitis B vaccine: N/A Hepatitis C  screening: Up to date HIV Screening: Up to date Flu Vaccine: Get in the fall Lung cancer Screening: due in August Obesity Screening: Done today Pneumonia Vaccines (2): STI Screening: N/A PSA screening: Done today   LABORATORY TESTING:  Health maintenance labs ordered today as discussed above.   The natural history of prostate cancer and ongoing controversy regarding screening and potential treatment outcomes of prostate cancer has been discussed with the patient. The meaning of a false positive PSA and a false negative PSA has been discussed. He indicates understanding of the limitations of this screening test and wishes to proceed with screening PSA testing.   IMMUNIZATIONS:   - Tdap: Tetanus vaccination status reviewed: last tetanus booster within 10 years. - Influenza: Up to date - Pneumovax: Up to date - Prevnar: Not applicable  SCREENING: - Colonoscopy: Up to date  Discussed with patient purpose of the colonoscopy is to detect colon cancer at curable precancerous or early stages   PATIENT COUNSELING:    Sexuality: Discussed sexually transmitted diseases, partner selection, use of condoms, avoidance of unintended pregnancy  and contraceptive alternatives.   Advised to avoid cigarette smoking.  I discussed with the patient that most people either abstain from alcohol or drink within safe limits (<=14/week and <=4 drinks/occasion for males, <=7/weeks and <= 3 drinks/occasion for females) and that the risk for alcohol disorders and other health effects rises proportionally with the number of drinks per week and  how often a drinker exceeds daily limits.  Discussed cessation/primary prevention of drug use and availability of treatment for abuse.   Diet: Encouraged to adjust caloric intake to maintain  or achieve ideal body weight, to reduce intake of dietary saturated fat and total fat, to limit sodium intake by avoiding high sodium foods and not adding table salt, and to maintain  adequate dietary potassium and calcium preferably from fresh fruits, vegetables, and low-fat dairy products.    stressed the importance of regular exercise  Injury prevention: Discussed safety belts, safety helmets, smoke detector, smoking near bedding or upholstery.   Dental health: Discussed importance of regular tooth brushing, flossing, and dental visits.   Follow up plan: NEXT PREVENTATIVE PHYSICAL DUE IN 1 YEAR. Return in about 4 weeks (around 11/24/2018) for follow up and wellness.  >50 minutes spent in counseling and coordination of care with patient today.

## 2018-10-27 NOTE — Assessment & Plan Note (Signed)
Will start him on flomax and recheck 1 month. Call with any concerns.

## 2018-10-28 LAB — LIPID PANEL W/O CHOL/HDL RATIO
Cholesterol, Total: 140 mg/dL (ref 100–199)
HDL: 46 mg/dL (ref >39)
LDL Calculated: 77 mg/dL (ref 0–99)
Triglycerides: 83 mg/dL (ref 0–149)
VLDL Cholesterol Cal: 17 mg/dL (ref 5–40)

## 2018-10-28 LAB — COMPREHENSIVE METABOLIC PANEL
ALT: 19 IU/L (ref 0–44)
AST: 13 IU/L (ref 0–40)
Albumin/Globulin Ratio: 1.3 (ref 1.2–2.2)
Albumin: 3.6 g/dL — ABNORMAL LOW (ref 3.8–4.8)
Alkaline Phosphatase: 92 IU/L (ref 39–117)
BUN/Creatinine Ratio: 12 (ref 10–24)
BUN: 9 mg/dL (ref 8–27)
Bilirubin Total: 0.3 mg/dL (ref 0.0–1.2)
CO2: 21 mmol/L (ref 20–29)
Calcium: 9.8 mg/dL (ref 8.6–10.2)
Chloride: 105 mmol/L (ref 96–106)
Creatinine, Ser: 0.74 mg/dL — ABNORMAL LOW (ref 0.76–1.27)
GFR calc Af Amer: 114 mL/min/{1.73_m2} (ref >59)
GFR calc non Af Amer: 99 mL/min/{1.73_m2} (ref >59)
Globulin, Total: 2.7 g/dL (ref 1.5–4.5)
Glucose: 93 mg/dL (ref 65–99)
Potassium: 4.2 mmol/L (ref 3.5–5.2)
Sodium: 140 mmol/L (ref 134–144)
Total Protein: 6.3 g/dL (ref 6.0–8.5)

## 2018-10-28 LAB — CBC WITH DIFFERENTIAL/PLATELET
Basophils Absolute: 0 10*3/uL (ref 0.0–0.2)
Basos: 0 %
EOS (ABSOLUTE): 0.2 10*3/uL (ref 0.0–0.4)
Eos: 3 %
Hematocrit: 38.9 % (ref 37.5–51.0)
Hemoglobin: 13.2 g/dL (ref 13.0–17.7)
Immature Grans (Abs): 0 10*3/uL (ref 0.0–0.1)
Immature Granulocytes: 0 %
Lymphocytes Absolute: 2.3 10*3/uL (ref 0.7–3.1)
Lymphs: 33 %
MCH: 31.9 pg (ref 26.6–33.0)
MCHC: 33.9 g/dL (ref 31.5–35.7)
MCV: 94 fL (ref 79–97)
Monocytes Absolute: 0.6 10*3/uL (ref 0.1–0.9)
Monocytes: 8 %
Neutrophils Absolute: 3.7 10*3/uL (ref 1.4–7.0)
Neutrophils: 56 %
Platelets: 198 10*3/uL (ref 150–450)
RBC: 4.14 x10E6/uL (ref 4.14–5.80)
RDW: 11.6 % (ref 11.6–15.4)
WBC: 6.8 10*3/uL (ref 3.4–10.8)

## 2018-10-28 LAB — TSH: TSH: <0.006 u[IU]/mL — ABNORMAL LOW (ref 0.450–4.500)

## 2018-10-28 LAB — PSA: Prostate Specific Ag, Serum: 2 ng/mL (ref 0.0–4.0)

## 2018-10-29 LAB — UA/M W/RFLX CULTURE, ROUTINE
Bilirubin, UA: NEGATIVE
Glucose, UA: NEGATIVE
Ketones, UA: NEGATIVE
Nitrite, UA: NEGATIVE
Protein,UA: NEGATIVE
RBC, UA: NEGATIVE
Specific Gravity, UA: <1.005 — ABNORMAL LOW (ref 1.005–1.030)
Urobilinogen, Ur: 0.2 mg/dL (ref 0.2–1.0)
pH, UA: 6 (ref 5.0–7.5)

## 2018-10-29 LAB — MICROSCOPIC EXAMINATION
Bacteria, UA: NONE SEEN
RBC, Urine: NONE SEEN /hpf (ref 0–2)

## 2018-10-29 LAB — URINE CULTURE, REFLEX

## 2018-11-05 ENCOUNTER — Encounter: Payer: Self-pay | Admitting: Family Medicine

## 2018-11-27 ENCOUNTER — Ambulatory Visit
Admission: RE | Admit: 2018-11-27 | Discharge: 2018-11-27 | Disposition: A | Discharge status: Home / Self Care | Payer: Medicare Other | Source: Ambulatory Visit | Attending: Urology | Admitting: Urology

## 2018-11-27 ENCOUNTER — Other Ambulatory Visit: Payer: Self-pay

## 2018-11-27 DIAGNOSIS — N2889 Other specified disorders of kidney and ureter: Secondary | ICD-10-CM | POA: Diagnosis not present

## 2018-11-27 DIAGNOSIS — N281 Cyst of kidney, acquired: Secondary | ICD-10-CM | POA: Diagnosis not present

## 2018-11-28 ENCOUNTER — Other Ambulatory Visit: Payer: Self-pay | Admitting: Urology

## 2018-11-28 ENCOUNTER — Telehealth: Payer: Self-pay | Admitting: Family Medicine

## 2018-11-28 DIAGNOSIS — N2889 Other specified disorders of kidney and ureter: Secondary | ICD-10-CM

## 2018-11-28 NOTE — Chronic Care Management (AMB) (Signed)
Chronic Care Management   Note  11/28/2018 Name: Tyler Cohen MRN: [id number] DOB: 06-25-1955  Tyler Cohen is a 63 y.o. year old male who is a primary care patient of Valerie Roys, DO. I reached out to [name] by phone today in response to a referral sent by Tyler Cohen health plan.    [name] was given information about Chronic Care Management services today including:  1. CCM service includes personalized support from designated clinical staff supervised by his physician, including individualized plan of care and coordination with other care providers 2. 24/7 contact phone numbers for assistance for urgent and routine care needs. 3. Service will only be billed when office clinical staff spend 20 minutes or more in a month to coordinate care. 4. Only one practitioner may furnish and bill the service in a calendar month. 5. The patient may stop CCM services at any time (effective at the end of the month) by phone call to the office staff. 6. The patient will be responsible for cost sharing (co-pay) of up to 20% of the service fee (after annual deductible is met).  Patient agreed to services and verbal consent obtained.   Follow up plan: Telephone appointment with CCM team member scheduled for: 01/05/2019  [place]  ??[email or web address]   ??[phone]

## 2018-11-28 NOTE — Telephone Encounter (Signed)
Patient notified, MRI order is in. Patient voiced understanding.

## 2018-11-28 NOTE — Telephone Encounter (Signed)
-----   Message from Abbie Sons, MD sent at 11/28/2018  8:33 AM EDT ----- Renal ultrasound showed a second small growth in the left kidney which was not seen on prior CT.  Recommend scheduling a follow-up CT for further evaluation.  He already has a follow-up scheduled October 2020.  CT order entered

## 2018-12-07 ENCOUNTER — Other Ambulatory Visit: Payer: Medicare HMO

## 2018-12-26 ENCOUNTER — Other Ambulatory Visit: Payer: Self-pay

## 2018-12-26 ENCOUNTER — Encounter: Payer: Self-pay | Admitting: Family Medicine

## 2018-12-26 ENCOUNTER — Ambulatory Visit (INDEPENDENT_AMBULATORY_CARE_PROVIDER_SITE_OTHER): Payer: Medicare Other | Admitting: Family Medicine

## 2018-12-26 VITALS — BP 109/70 | HR 80 | Temp 98.3°F | Ht 73.0 in | Wt 153.0 lb

## 2018-12-26 DIAGNOSIS — E7800 Pure hypercholesterolemia, unspecified: Secondary | ICD-10-CM | POA: Diagnosis not present

## 2018-12-26 DIAGNOSIS — R350 Frequency of micturition: Secondary | ICD-10-CM | POA: Diagnosis not present

## 2018-12-26 DIAGNOSIS — N401 Enlarged prostate with lower urinary tract symptoms: Secondary | ICD-10-CM

## 2018-12-26 DIAGNOSIS — D7589 Other specified diseases of blood and blood-forming organs: Secondary | ICD-10-CM | POA: Diagnosis not present

## 2018-12-26 DIAGNOSIS — Z23 Encounter for immunization: Secondary | ICD-10-CM | POA: Diagnosis not present

## 2018-12-26 DIAGNOSIS — E0590 Thyrotoxicosis, unspecified without thyrotoxic crisis or storm: Secondary | ICD-10-CM

## 2018-12-26 DIAGNOSIS — Z720 Tobacco use: Secondary | ICD-10-CM | POA: Diagnosis not present

## 2018-12-26 DIAGNOSIS — E278 Other specified disorders of adrenal gland: Secondary | ICD-10-CM

## 2018-12-26 DIAGNOSIS — K769 Liver disease, unspecified: Secondary | ICD-10-CM

## 2018-12-26 DIAGNOSIS — Z0000 Encounter for general adult medical examination without abnormal findings: Secondary | ICD-10-CM | POA: Diagnosis not present

## 2018-12-26 LAB — UA/M W/RFLX CULTURE, ROUTINE
Bilirubin, UA: NEGATIVE
Glucose, UA: NEGATIVE
Ketones, UA: NEGATIVE
Leukocytes,UA: NEGATIVE
Nitrite, UA: NEGATIVE
Protein,UA: NEGATIVE
RBC, UA: NEGATIVE
Specific Gravity, UA: 1.02 (ref 1.005–1.030)
Urobilinogen, Ur: 1 mg/dL (ref 0.2–1.0)
pH, UA: 6 (ref 5.0–7.5)

## 2018-12-26 NOTE — Patient Instructions (Addendum)
Preventative Services:  Health Risk Assessment and Personalized Prevention Plan: Done today Bone Mass Measurements: Order- encouraged to go CVD Screening: Done today Colon Cancer Screening: Up to date  Depression Screening: Done today Diabetes Screening: Done today Glaucoma Screening: See your eye doctor Hepatitis B vaccine: N/A Hepatitis C screening: Up to date HIV Screening: Up to date Flu Vaccine: Done today Lung cancer Screening: Shawn will call you Obesity Screening: Done today Pneumonia Vaccines (2): up to date STI Screening: N/A PSA screening: Done today  Call for your bone density: [facility]  Address: [address], [place], [place] [address]  Phone: [phone]   Health Maintenance, Male Adopting a healthy lifestyle and getting preventive care are important in promoting health and wellness. Ask your health care provider about:  The right schedule for you to have regular tests and exams.  Things you can do on your own to prevent diseases and keep yourself healthy. What should I know about diet, weight, and exercise? Eat a healthy diet   Eat a diet that includes plenty of vegetables, fruits, low-fat dairy products, and lean protein.  Do not eat a lot of foods that are high in solid fats, added sugars, or sodium. Maintain a healthy weight Body mass index (BMI) is a measurement that can be used to identify possible weight problems. It estimates body fat based on height and weight. Your health care provider can help determine your BMI and help you achieve or maintain a healthy weight. Get regular exercise Get regular exercise. This is one of the most important things you can do for your health. Most adults should:  Exercise for at least 150 minutes each week. The exercise should increase your heart rate and make you sweat (moderate-intensity exercise).  Do strengthening exercises at least twice a week. This is in addition to  the moderate-intensity exercise.  Spend less time sitting. Even light physical activity can be beneficial. Watch cholesterol and blood lipids Have your blood tested for lipids and cholesterol at 63 years of age, then have this test every 5 years. You may need to have your cholesterol levels checked more often if:  Your lipid or cholesterol levels are high.  You are older than 63 years of age.  You are at high risk for heart disease. What should I know about cancer screening? Many types of cancers can be detected early and may often be prevented. Depending on your health history and family history, you may need to have cancer screening at various ages. This may include screening for:  Colorectal cancer.  Prostate cancer.  Skin cancer.  Lung cancer. What should I know about heart disease, diabetes, and high blood pressure? Blood pressure and heart disease  High blood pressure causes heart disease and increases the risk of stroke. This is more likely to develop in people who have high blood pressure readings, are of African descent, or are overweight.  Talk with your health care provider about your target blood pressure readings.  Have your blood pressure checked: ? Every 3-5 years if you are 47-56 years of age. ? Every year if you are 66 years old or older.  If you are between the ages of 29 and 51 and are a current or former smoker, ask your health care provider if you should have a one-time screening for abdominal aortic aneurysm (AAA). Diabetes Have regular diabetes screenings. This checks your fasting blood sugar level. Have the screening done:  Once every  three years after age 57 if you are at a normal weight and have a low risk for diabetes.  More often and at a younger age if you are overweight or have a high risk for diabetes. What should I know about preventing infection? Hepatitis B If you have a higher risk for hepatitis B, you should be screened for this virus.  Talk with your health care provider to find out if you are at risk for hepatitis B infection. Hepatitis C Blood testing is recommended for:  Everyone born from [birth year] through 1965.  Anyone with known risk factors for hepatitis C. Sexually transmitted infections (STIs)  You should be screened each year for STIs, including gonorrhea and chlamydia, if: ? You are sexually active and are younger than 63 years of age. ? You are older than 63 years of age and your health care provider tells you that you are at risk for this type of infection. ? Your sexual activity has changed since you were last screened, and you are at increased risk for chlamydia or gonorrhea. Ask your health care provider if you are at risk.  Ask your health care provider about whether you are at high risk for HIV. Your health care provider may recommend a prescription medicine to help prevent HIV infection. If you choose to take medicine to prevent HIV, you should first get tested for HIV. You should then be tested every 3 months for as long as you are taking the medicine. Follow these instructions at home: Lifestyle  Do not use any products that contain nicotine or tobacco, such as cigarettes, e-cigarettes, and chewing tobacco. If you need help quitting, ask your health care provider.  Do not use street drugs.  Do not share needles.  Ask your health care provider for help if you need support or information about quitting drugs. Alcohol use  Do not drink alcohol if your health care provider tells you not to drink.  If you drink alcohol: ? Limit how much you have to 0-2 drinks a day. ? Be aware of how much alcohol is in your drink. In the [place] one drink equals one 12 oz bottle of beer (355 mL), one 5 oz glass of wine (148 mL), or one 1 oz glass of hard liquor (44 mL). General instructions  Schedule regular health, dental, and eye exams.  Stay current with your vaccines.  Tell your health care provider if: ? You  often feel depressed. ? You have ever been abused or do not feel safe at home. Summary  Adopting a healthy lifestyle and getting preventive care are important in promoting health and wellness.  Follow your health care provider's instructions about healthy diet, exercising, and getting tested or screened for diseases.  Follow your health care provider's instructions on monitoring your cholesterol and blood pressure. This information is not intended to replace advice given to you by your health care provider. Make sure you discuss any questions you have with your health care provider. Document Released: 09/18/2007 Document Revised: 03/15/2018 Document Reviewed: 03/15/2018 Elsevier Patient Education  2020 [employer].

## 2018-12-26 NOTE — Assessment & Plan Note (Signed)
Not interested in quitting at this time. Continue to monitor. Call with any concerns.  

## 2018-12-26 NOTE — Assessment & Plan Note (Signed)
Continue to follow with endocrinology. Continue to monitor. Call with any concerns. Doing better.

## 2018-12-26 NOTE — Assessment & Plan Note (Signed)
Under good control on current regimen. Continue current regimen. Continue to monitor. Call with any concerns. Refills given. Labs checked today.  

## 2018-12-26 NOTE — Assessment & Plan Note (Signed)
Checking labs today. Await results.  

## 2018-12-26 NOTE — Progress Notes (Signed)
BP 109/70   Pulse 80   Temp 98.3 F (36.8 C) (Oral)   Ht 6\' 1"  (1.854 m)   Wt 153 lb (69.4 kg)   SpO2 98%   BMI 20.19 kg/m    Subjective:    Patient ID: Tyler Cohen, male    DOB: [birth year], 63 y.o.   MRN: [id number]  HPI: Tyler Cohen is a 63 y.o. male presenting on 12/26/2018 for comprehensive medical examination. Current medical complaints include:  HYPERLIPIDEMIA Hyperlipidemia status: excellent compliance Satisfied with current treatment?  yes Side effects:  no Medication compliance: excellent compliance Past cholesterol meds: atorvastatin Supplements: none Aspirin:  no The 10-year ASCVD risk score Tyler Bussing DC Jr., et al., 2013) is: 10.9%   Values used to calculate the score:     Age: 13 years     Sex: Male     Is Non-Hispanic African American: Yes     Diabetic: No     Tobacco smoker: Yes     Systolic Blood Pressure: 0000000 mmHg     Is BP treated: No     HDL Cholesterol: 46 mg/dL     Total Cholesterol: 140 mg/dL Chest pain:  no Coronary artery disease:  no  HYPERTHYROIDISM- following with Dr. Gabriel Carina, feeling well Thyroid control status:better Satisfied with current treatment? yes Medication side effects: no Medication compliance: excellent compliance Recent dose adjustment:yes Fatigue: no Cold intolerance: no Heat intolerance: no Weight gain: no Weight loss: no Constipation: no Diarrhea/loose stools: no Palpitations: no Lower extremity edema: no Anxiety/depressed mood: no  BPH BPH status: controlled Satisfied with current treatment?: yes Medication side effects: no Medication compliance: excellent compliance Duration: chronic Nocturia: no Urinary frequency:no Incomplete voiding: no Urgency: no Weak urinary stream: no Straining to start stream: no Dysuria: no Onset: gradual Severity: mild  Interim Problems from his last visit: no  Functional Status Survey: Is the patient deaf or have difficulty hearing?: No Does the patient have  difficulty seeing, even when wearing glasses/contacts?: No Does the patient have difficulty concentrating, remembering, or making decisions?: No Does the patient have difficulty walking or climbing stairs?: No Does the patient have difficulty dressing or bathing?: No Does the patient have difficulty doing errands alone such as visiting a doctor's office or shopping?: No  FALL RISK: Fall Risk  10/27/2018 11/07/2017 10/14/2016 10/15/2015  Falls in the past year? 0 No No No  Number falls in past yr: 0 - - -  Injury with Fall? 0 - - -  Follow up Falls evaluation completed - - -    Depression Screen Depression screen [facility] 2/9 12/26/2018 10/27/2018 11/08/2017 11/07/2017 10/14/2016  Decreased Interest 0 0 0 0 0  Down, Depressed, Hopeless 0 0 0 0 0  PHQ - 2 Score 0 0 0 0 0  Altered sleeping 0 1 0 - -  Tired, decreased energy 0 0 0 - -  Change in appetite 1 1 0 - -  Feeling bad or failure about yourself  0 0 0 - -  Trouble concentrating 0 0 0 - -  Moving slowly or fidgety/restless 0 0 0 - -  Suicidal thoughts 0 0 0 - -  PHQ-9 Score 1 2 0 - -  Difficult doing work/chores Not difficult at all Not difficult at all - - -    Advanced Directives <no information>  Past Medical History:  Past Medical History:  Diagnosis Date  . Arthritis   . Heart murmur   . Hyperlipidemia   . Macrocytosis   .  Thyroid disease   . Tobacco abuse     Surgical History:  Past Surgical History:  Procedure Laterality Date  . COLONOSCOPY WITH PROPOFOL N/A 12/01/2015   Procedure: COLONOSCOPY WITH PROPOFOL;  Surgeon: Lucilla Lame, MD;  Location: [place];  Service: Endoscopy;  Laterality: N/A;  . POLYPECTOMY  12/01/2015   Procedure: POLYPECTOMY;  Surgeon: Lucilla Lame, MD;  Location: [place];  Service: Endoscopy;;    Medications:  Current Outpatient Medications on File Prior to Visit  Medication Sig  . albuterol (VENTOLIN HFA) 108 (90 Base) MCG/ACT inhaler Inhale 2 puffs into the lungs every 6  (six) hours as needed for wheezing or shortness of breath.  Marland Kitchen aspirin 81 MG tablet Take 81 mg by mouth daily.  Marland Kitchen atorvastatin (LIPITOR) 20 MG tablet Take 1 tablet (20 mg total) by mouth at bedtime.  Marland Kitchen b complex vitamins tablet Take 1 tablet by mouth daily.  . calcipotriene (DOVONOX) 0.005 % cream Apply topically 2 (two) times daily.  . diflorasone (PSORCON) 0.05 % ointment as needed.   Marland Kitchen econazole nitrate 1 % cream as needed.   . methimazole (TAPAZOLE) 10 MG tablet Take by mouth daily.   Marland Kitchen propylthiouracil (PTU) 50 MG tablet Take by mouth 2 (two) times daily.   . tamsulosin (FLOMAX) 0.4 MG CAPS capsule Take 1 capsule (0.4 mg total) by mouth daily.   No current facility-administered medications on file prior to visit.     Allergies:  No Known Allergies  Social History:  Social History   Socioeconomic History  . Marital status: Married    Spouse name: Not on file  . Number of children: Not on file  . Years of education: Not on file  . Highest education level: High school graduate  Occupational History  . Not on file  Social Needs  . Financial resource strain: Not hard at all  . Food insecurity    Worry: Never true    Inability: Never true  . Transportation needs    Medical: No    Non-medical: No  Tobacco Use  . Smoking status: Current Every Day Smoker    Packs/day: 0.25    Years: 42.00    Pack years: 10.50    Types: Cigarettes  . Smokeless tobacco: Never Used  . Tobacco comment: 1 pack in 4 days   Substance and Sexual Activity  . Alcohol use: No  . Drug use: Yes    Types: Marijuana    Comment: pt states he smokes every once in a while  . Sexual activity: Not on file  Lifestyle  . Physical activity    Days per week: 0 days    Minutes per session: 0 min  . Stress: Not at all  Relationships  . Social Herbalist on phone: Twice a week    Gets together: Twice a week    Attends religious service: More than 4 times per year    Active member of club or  organization: No    Attends meetings of clubs or organizations: Never    Relationship status: Married  . Intimate partner violence    Fear of current or ex partner: No    Emotionally abused: No    Physically abused: No    Forced sexual activity: No  Other Topics Concern  . Not on file  Social History Narrative   Works part time.   Social History   Tobacco Use  Smoking Status Current Every Day Smoker  . Packs/day:  0.25  . Years: 42.00  . Pack years: 10.50  . Types: Cigarettes  Smokeless Tobacco Never Used  Tobacco Comment   1 pack in 4 days    Social History   Substance and Sexual Activity  Alcohol Use No    Family History:  Family History  Problem Relation Age of Onset  . Hypertension Mother   . Diabetes Mother        lost both legs  . Kidney disease Mother   . Hypertension Father   . Emphysema Father   . Sickle cell trait Father   . Hypertension Brother   . Hyperlipidemia Brother   . Sickle cell trait Sister     Past medical history, surgical history, medications, allergies, family history and social history reviewed with patient today and changes made to appropriate areas of the chart.   Review of Systems  Constitutional: Negative.   HENT: Negative.   Eyes: Negative.   Respiratory: Negative.   Cardiovascular: Negative.   Gastrointestinal: Negative.   Genitourinary: Negative.   Musculoskeletal: Negative.   Skin: Negative.   Neurological: Negative.   Endo/Heme/Allergies: Negative.   Psychiatric/Behavioral: Negative.     All other ROS negative except what is listed above and in the HPI.      Objective:    BP 109/70   Pulse 80   Temp 98.3 F (36.8 C) (Oral)   Ht 6\' 1"  (1.854 m)   Wt 153 lb (69.4 kg)   SpO2 98%   BMI 20.19 kg/m   Wt Readings from Last 3 Encounters:  12/26/18 153 lb (69.4 kg)  10/27/18 147 lb 9.6 oz (67 kg)  08/22/18 155 lb (70.3 kg)    Physical Exam Vitals signs and nursing note reviewed.  Constitutional:      General:  He is not in acute distress.    Appearance: Normal appearance. He is obese. He is not ill-appearing, toxic-appearing or diaphoretic.  HENT:     Head: Normocephalic and atraumatic.     Right Ear: Tympanic membrane, ear canal and external ear normal. There is no impacted cerumen.     Left Ear: Tympanic membrane, ear canal and external ear normal. There is no impacted cerumen.     Nose: Nose normal. No congestion or rhinorrhea.     Mouth/Throat:     Mouth: Mucous membranes are moist.     Pharynx: Oropharynx is clear. No oropharyngeal exudate or posterior oropharyngeal erythema.  Eyes:     General: No scleral icterus.       Right eye: No discharge.        Left eye: No discharge.     Extraocular Movements: Extraocular movements intact.     Conjunctiva/sclera: Conjunctivae normal.     Pupils: Pupils are equal, round, and reactive to light.  Neck:     Musculoskeletal: Normal range of motion and neck supple. No neck rigidity or muscular tenderness.     Vascular: No carotid bruit.  Cardiovascular:     Rate and Rhythm: Normal rate and regular rhythm.     Pulses: Normal pulses.     Heart sounds: No murmur. No friction rub. No gallop.   Pulmonary:     Effort: Pulmonary effort is normal. No respiratory distress.     Breath sounds: Normal breath sounds. No stridor. No wheezing, rhonchi or rales.  Chest:     Chest wall: No tenderness.  Abdominal:     General: Abdomen is flat. Bowel sounds are normal. There is no distension.  Palpations: Abdomen is soft. There is no mass.     Tenderness: There is no abdominal tenderness. There is no right CVA tenderness, left CVA tenderness, guarding or rebound.     Hernia: No hernia is present.  Genitourinary:    Comments: Genital exam deferred with shared decision making Musculoskeletal:        General: No swelling, tenderness, deformity or signs of injury.     Right lower leg: No edema.     Left lower leg: No edema.  Lymphadenopathy:     Cervical: No  cervical adenopathy.  Skin:    General: Skin is warm and dry.     Capillary Refill: Capillary refill takes less than 2 seconds.     Coloration: Skin is not jaundiced or pale.     Findings: No bruising, erythema, lesion or rash.  Neurological:     General: No focal deficit present.     Mental Status: He is alert and oriented to person, place, and time.     Cranial Nerves: No cranial nerve deficit.     Sensory: No sensory deficit.     Motor: No weakness.     Coordination: Coordination normal.     Gait: Gait normal.     Deep Tendon Reflexes: Reflexes normal.  Psychiatric:        Mood and Affect: Mood normal.        Behavior: Behavior normal.        Thought Content: Thought content normal.        Judgment: Judgment normal.     6CIT Screen 12/26/2018 10/27/2018 11/07/2017 10/14/2016  What Year? 0 points 0 points 0 points 0 points  What month? 0 points 0 points 0 points 0 points  What time? 0 points 0 points 0 points 0 points  Count back from 20 2 points 0 points 0 points 0 points  Months in reverse 0 points 0 points 0 points 0 points  Repeat phrase 2 points 4 points 0 points 0 points  Total Score 4 4 0 0     Results for orders placed or performed in visit on 10/27/18  Microscopic Examination   URINE  Result Value Ref Range   WBC, UA 0-5 0 - 5 /hpf   RBC None seen 0 - 2 /hpf   Epithelial Cells (non renal) 0-10 0 - 10 /hpf   Bacteria, UA None seen None seen/Few  Urine Culture, Reflex   URINE  Result Value Ref Range   Urine Culture, Routine Final report    Organism ID, Bacteria Lactobacillus species   CBC with Differential/Platelet  Result Value Ref Range   WBC 6.8 3.4 - 10.8 x10E3/uL   RBC 4.14 4.14 - 5.80 x10E6/uL   Hemoglobin 13.2 13.0 - 17.7 g/dL   Hematocrit 38.9 37.5 - 51.0 %   MCV 94 79 - 97 fL   MCH 31.9 26.6 - 33.0 pg   MCHC 33.9 31.5 - 35.7 g/dL   RDW 11.6 11.6 - 15.4 %   Platelets 198 150 - 450 x10E3/uL   Neutrophils 56 Not Estab. %   Lymphs 33 Not Estab. %    Monocytes 8 Not Estab. %   Eos 3 Not Estab. %   Basos 0 Not Estab. %   Neutrophils Absolute 3.7 1.4 - 7.0 x10E3/uL   Lymphocytes Absolute 2.3 0.7 - 3.1 x10E3/uL   Monocytes Absolute 0.6 0.1 - 0.9 x10E3/uL   EOS (ABSOLUTE) 0.2 0.0 - 0.4 x10E3/uL   Basophils Absolute 0.0 0.0 -  0.2 x10E3/uL   Immature Granulocytes 0 Not Estab. %   Immature Grans (Abs) 0.0 0.0 - 0.1 x10E3/uL  Comprehensive metabolic panel  Result Value Ref Range   Glucose 93 65 - 99 mg/dL   BUN 9 8 - 27 mg/dL   Creatinine, Ser 0.74 (L) 0.76 - 1.27 mg/dL   GFR calc non Af Amer 99 >59 mL/min/1.73   GFR calc Af Amer 114 >59 mL/min/1.73   BUN/Creatinine Ratio 12 10 - 24   Sodium 140 134 - 144 mmol/L   Potassium 4.2 3.5 - 5.2 mmol/L   Chloride 105 96 - 106 mmol/L   CO2 21 20 - 29 mmol/L   Calcium 9.8 8.6 - 10.2 mg/dL   Total Protein 6.3 6.0 - 8.5 g/dL   Albumin 3.6 (L) 3.8 - 4.8 g/dL   Globulin, Total 2.7 1.5 - 4.5 g/dL   Albumin/Globulin Ratio 1.3 1.2 - 2.2   Bilirubin Total 0.3 0.0 - 1.2 mg/dL   Alkaline Phosphatase 92 39 - 117 IU/L   AST 13 0 - 40 IU/L   ALT 19 0 - 44 IU/L  Lipid Panel w/o Chol/HDL Ratio  Result Value Ref Range   Cholesterol, Total 140 100 - 199 mg/dL   Triglycerides 83 0 - 149 mg/dL   HDL 46 >39 mg/dL   VLDL Cholesterol Cal 17 5 - 40 mg/dL   LDL Calculated 77 0 - 99 mg/dL  PSA  Result Value Ref Range   Prostate Specific Ag, Serum 2.0 0.0 - 4.0 ng/mL  UA/M w/rflx Culture, Routine   Specimen: Urine   URINE  Result Value Ref Range   Specific Gravity, UA <1.005 (L) 1.005 - 1.030   pH, UA 6.0 5.0 - 7.5   Color, UA Yellow Yellow   Appearance Ur Clear Clear   Leukocytes,UA Trace (A) Negative   Protein,UA Negative Negative/Trace   Glucose, UA Negative Negative   Ketones, UA Negative Negative   RBC, UA Negative Negative   Bilirubin, UA Negative Negative   Urobilinogen, Ur 0.2 0.2 - 1.0 mg/dL   Nitrite, UA Negative Negative   Microscopic Examination See below:    Urinalysis Reflex Comment    TSH  Result Value Ref Range   TSH <0.006 (L) 0.450 - 4.500 uIU/mL      Assessment & Plan:   Problem List Items Addressed This Visit      Endocrine   Hyperthyroidism    Continue to follow with endocrinology. Continue to monitor. Call with any concerns. Doing better.       Relevant Medications   propylthiouracil (PTU) 50 MG tablet   Other Relevant Orders   Comprehensive metabolic panel   TSH     Genitourinary   BPH (benign prostatic hyperplasia)    Under good control on current regimen. Continue current regimen. Continue to monitor. Call with any concerns. Refills given. Labs checked today.        Relevant Orders   Comprehensive metabolic panel   PSA     Other   Hyperlipidemia    Under good control on current regimen. Continue current regimen. Continue to monitor. Call with any concerns. Refills given. Labs drawn today.       Relevant Orders   Comprehensive metabolic panel   Lipid Panel w/o Chol/HDL Ratio   Tobacco abuse    Not interested in quitting at this time. Continue to monitor. Call with any concerns.       Relevant Orders   CBC with Differential/Platelet   UA/M  w/rflx Culture, Routine   Macrocytosis    Checking labs today. Await results. Call with any concerns.       Relevant Orders   CBC with Differential/Platelet   Liver lesion    Checking labs today. Await results.       Adrenal mass ([place])    Checking labs today. Await results.        Other Visit Diagnoses    Encounter for Medicare annual wellness exam    -  Primary   Preventative care discussed today as below.    Routine general medical examination at a health care facility       Vaccines up to date. Screening labs checked today. Colonoscopy up to date. Needs DEXA due to hyperthyroidism- ordered. Continue diet and exercise.    Flu vaccine need       Flu shot given today.   Relevant Orders   Flu Vaccine QUAD 36+ mos IM (Completed)       Preventative Services:  Health Risk Assessment  and Personalized Prevention Plan: Done today Bone Mass Measurements: Order- encouraged to go CVD Screening: Done today Colon Cancer Screening: Up to date  Depression Screening: Done today Diabetes Screening: Done today Glaucoma Screening: See your eye doctor Hepatitis B vaccine: N/A Hepatitis C screening: Up to date HIV Screening: Up to date Flu Vaccine: Done today Lung cancer Screening: Tyler Cohen will call you Obesity Screening: Done today Pneumonia Vaccines (2): up to date STI Screening: N/A PSA screening: Done today  LABORATORY TESTING:  Health maintenance labs ordered today as discussed above.   The natural history of prostate cancer and ongoing controversy regarding screening and potential treatment outcomes of prostate cancer has been discussed with the patient. The meaning of a false positive PSA and a false negative PSA has been discussed. He indicates understanding of the limitations of this screening test and wishes to proceed with screening PSA testing.   IMMUNIZATIONS:   - Tdap: Tetanus vaccination status reviewed: last tetanus booster within 10 years. - Influenza: Administered today - Pneumovax: Up to date - Prevnar: Not applicable - Zostavax vaccine: Not applicable  SCREENING: - Colonoscopy: Up to date  Discussed with patient purpose of the colonoscopy is to detect colon cancer at curable precancerous or early stages   PATIENT COUNSELING:    Sexuality: Discussed sexually transmitted diseases, partner selection, use of condoms, avoidance of unintended pregnancy  and contraceptive alternatives.   Advised to avoid cigarette smoking.  I discussed with the patient that most people either abstain from alcohol or drink within safe limits (<=14/week and <=4 drinks/occasion for males, <=7/weeks and <= 3 drinks/occasion for females) and that the risk for alcohol disorders and other health effects rises proportionally with the number of drinks per week and how often a drinker  exceeds daily limits.  Discussed cessation/primary prevention of drug use and availability of treatment for abuse.   Diet: Encouraged to adjust caloric intake to maintain  or achieve ideal body weight, to reduce intake of dietary saturated fat and total fat, to limit sodium intake by avoiding high sodium foods and not adding table salt, and to maintain adequate dietary potassium and calcium preferably from fresh fruits, vegetables, and low-fat dairy products.    stressed the importance of regular exercise  Injury prevention: Discussed safety belts, safety helmets, smoke detector, smoking near bedding or upholstery.   Dental health: Discussed importance of regular tooth brushing, flossing, and dental visits.   Follow up plan: NEXT PREVENTATIVE PHYSICAL DUE IN 1 YEAR.  Return in about 6 months (around 06/25/2019) for Follow up.

## 2018-12-26 NOTE — Assessment & Plan Note (Signed)
Checking labs today. Await results. Call with any concerns.  

## 2018-12-26 NOTE — Assessment & Plan Note (Signed)
Under good control on current regimen. Continue current regimen. Continue to monitor. Call with any concerns. Refills given. Labs drawn today.   

## 2018-12-27 LAB — COMPREHENSIVE METABOLIC PANEL WITH GFR
ALT: 19 [IU]/L (ref 0–44)
AST: 19 [IU]/L (ref 0–40)
Albumin/Globulin Ratio: 1.4 (ref 1.2–2.2)
Albumin: 3.6 g/dL — ABNORMAL LOW (ref 3.8–4.8)
Alkaline Phosphatase: 115 [IU]/L (ref 39–117)
BUN/Creatinine Ratio: 11 (ref 10–24)
BUN: 9 mg/dL (ref 8–27)
Bilirubin Total: 0.3 mg/dL (ref 0.0–1.2)
CO2: 23 mmol/L (ref 20–29)
Calcium: 9.6 mg/dL (ref 8.6–10.2)
Chloride: 106 mmol/L (ref 96–106)
Creatinine, Ser: 0.8 mg/dL (ref 0.76–1.27)
GFR calc Af Amer: 110 mL/min/{1.73_m2}
GFR calc non Af Amer: 95 mL/min/{1.73_m2}
Globulin, Total: 2.5 g/dL (ref 1.5–4.5)
Glucose: 95 mg/dL (ref 65–99)
Potassium: 4.4 mmol/L (ref 3.5–5.2)
Sodium: 141 mmol/L (ref 134–144)
Total Protein: 6.1 g/dL (ref 6.0–8.5)

## 2018-12-27 LAB — CBC WITH DIFFERENTIAL/PLATELET
Basophils Absolute: 0 10*3/uL (ref 0.0–0.2)
Basos: 0 %
EOS (ABSOLUTE): 0.2 10*3/uL (ref 0.0–0.4)
Eos: 3 %
Hematocrit: 43.6 % (ref 37.5–51.0)
Hemoglobin: 14.5 g/dL (ref 13.0–17.7)
Immature Grans (Abs): 0 10*3/uL (ref 0.0–0.1)
Immature Granulocytes: 0 %
Lymphocytes Absolute: 2.1 10*3/uL (ref 0.7–3.1)
Lymphs: 36 %
MCH: 31 pg (ref 26.6–33.0)
MCHC: 33.3 g/dL (ref 31.5–35.7)
MCV: 93 fL (ref 79–97)
Monocytes Absolute: 0.5 10*3/uL (ref 0.1–0.9)
Monocytes: 8 %
Neutrophils Absolute: 3.1 10*3/uL (ref 1.4–7.0)
Neutrophils: 53 %
Platelets: 182 10*3/uL (ref 150–450)
RBC: 4.68 x10E6/uL (ref 4.14–5.80)
RDW: 12.3 % (ref 11.6–15.4)
WBC: 5.9 10*3/uL (ref 3.4–10.8)

## 2018-12-27 LAB — TSH: TSH: <0.005 u[IU]/mL — ABNORMAL LOW (ref 0.450–4.500)

## 2018-12-27 LAB — LIPID PANEL W/O CHOL/HDL RATIO
Cholesterol, Total: 137 mg/dL (ref 100–199)
HDL: 46 mg/dL (ref >39)
LDL Chol Calc (NIH): 80 mg/dL (ref 0–99)
Triglycerides: 49 mg/dL (ref 0–149)
VLDL Cholesterol Cal: 11 mg/dL (ref 5–40)

## 2018-12-27 LAB — PSA: Prostate Specific Ag, Serum: 1.9 ng/mL (ref 0.0–4.0)

## 2018-12-30 ENCOUNTER — Encounter: Payer: Self-pay | Admitting: Family Medicine

## 2019-01-05 ENCOUNTER — Telehealth: Payer: Medicare Other

## 2019-01-10 ENCOUNTER — Ambulatory Visit
Admission: RE | Admit: 2019-01-10 | Discharge: 2019-01-10 | Disposition: A | Discharge status: Home / Self Care | Payer: Medicare Other | Source: Ambulatory Visit | Attending: Urology | Admitting: Urology

## 2019-01-10 ENCOUNTER — Other Ambulatory Visit: Payer: Self-pay

## 2019-01-10 DIAGNOSIS — N2889 Other specified disorders of kidney and ureter: Secondary | ICD-10-CM | POA: Diagnosis not present

## 2019-01-10 MED ORDER — GADOBUTROL 1 MMOL/ML IV SOLN
7.0000 mL | Freq: Once | INTRAVENOUS | Status: AC | PRN
  Administered 2019-01-10: 7 mL via INTRAVENOUS

## 2019-01-15 DIAGNOSIS — E0520 Thyrotoxicosis with toxic multinodular goiter without thyrotoxic crisis or storm: Secondary | ICD-10-CM | POA: Diagnosis not present

## 2019-01-27 ENCOUNTER — Other Ambulatory Visit: Payer: Self-pay

## 2019-01-27 DIAGNOSIS — Z20822 Contact with and (suspected) exposure to covid-19: Secondary | ICD-10-CM

## 2019-01-29 LAB — NOVEL CORONAVIRUS, NAA: SARS-CoV-2, NAA: NOT DETECTED

## 2019-02-01 ENCOUNTER — Ambulatory Visit (INDEPENDENT_AMBULATORY_CARE_PROVIDER_SITE_OTHER): Payer: Medicare Other | Admitting: Urology

## 2019-02-01 ENCOUNTER — Encounter: Payer: Self-pay | Admitting: Urology

## 2019-02-01 ENCOUNTER — Other Ambulatory Visit: Payer: Self-pay

## 2019-02-01 VITALS — BP 114/69 | HR 108 | Ht 73.0 in | Wt 149.1 lb

## 2019-02-01 DIAGNOSIS — N2889 Other specified disorders of kidney and ureter: Secondary | ICD-10-CM

## 2019-02-01 NOTE — Progress Notes (Signed)
02/01/2019 10:16 AM   Tyler Cohen Apr 26, 1955 [id number]  Referring provider: Valerie Roys, DO [place],   [id number]  Chief Complaint  Patient presents with  . Follow-up    Urologic history: 1.  Small left renal mass             -Incidentally identified MRI September 2018             -Elected surveillance   HPI: 63 y.o. male presents for follow-up of an incidental left renal mass.  Incidental 13 mm left upper pole enhancing lesion noted in 2018.  He elected surveillance.  The lesion has not changed on serial CT/MRI.  A renal ultrasound performed August 2020 showed a stable left upper pole lesion however a second abnormal lesion was identified.  Follow-up MRI performed earlier this month showed a stable 12 x 11 x 15 mm left upper pole lesion.  A second lesion was not identified.  He denies flank/abdominal pain or gross hematuria.  He is on tamsulosin for BPH and has no bothersome lower urinary tract symptoms.   PMH: Past Medical History:  Diagnosis Date  . Arthritis   . Heart murmur   . Hyperlipidemia   . Macrocytosis   . Thyroid disease   . Tobacco abuse     Surgical History: Past Surgical History:  Procedure Laterality Date  . COLONOSCOPY WITH PROPOFOL N/A 12/01/2015   Procedure: COLONOSCOPY WITH PROPOFOL;  Surgeon: Lucilla Lame, MD;  Location: [place];  Service: Endoscopy;  Laterality: N/A;  . POLYPECTOMY  12/01/2015   Procedure: POLYPECTOMY;  Surgeon: Lucilla Lame, MD;  Location: [place];  Service: Endoscopy;;    Home Medications:  Allergies as of 02/01/2019   No Known Allergies     Medication List       Accurate as of February 01, 2019 10:16 AM. If you have any questions, ask your nurse or doctor.        albuterol 108 (90 Base) MCG/ACT inhaler Commonly known as: VENTOLIN HFA Inhale 2 puffs into the lungs every 6 (six) hours as needed for wheezing or shortness of breath.   aspirin 81 MG tablet Take 81 mg by mouth  daily.   atorvastatin 20 MG tablet Commonly known as: LIPITOR Take 1 tablet (20 mg total) by mouth at bedtime.   b complex vitamins tablet Take 1 tablet by mouth daily.   calcipotriene 0.005 % cream Commonly known as: DOVONOX Apply topically 2 (two) times daily.   diflorasone 0.05 % ointment Commonly known as: PSORCON as needed.   econazole nitrate 1 % cream as needed.   methimazole 10 MG tablet Commonly known as: TAPAZOLE Take by mouth daily.   propylthiouracil 50 MG tablet Commonly known as: PTU Take by mouth 2 (two) times daily.   tamsulosin 0.4 MG Caps capsule Commonly known as: FLOMAX Take 1 capsule (0.4 mg total) by mouth daily.       Allergies: No Known Allergies  Family History: Family History  Problem Relation Age of Onset  . Hypertension Mother   . Diabetes Mother        lost both legs  . Kidney disease Mother   . Hypertension Father   . Emphysema Father   . Sickle cell trait Father   . Hypertension Brother   . Hyperlipidemia Brother   . Sickle cell trait Sister     Social History:  reports that he has been smoking cigarettes. He has a 10.50 pack-year  smoking history. He has never used smokeless tobacco. He reports current drug use. Drug: Marijuana. He reports that he does not drink alcohol.  ROS: UROLOGY Frequent Urination?: No Hard to postpone urination?: No Burning/pain with urination?: No Get up at night to urinate?: Yes Leakage of urine?: No Urine stream starts and stops?: No Trouble starting stream?: No Do you have to strain to urinate?: No Blood in urine?: No Urinary tract infection?: No Sexually transmitted disease?: No Injury to kidneys or bladder?: No Painful intercourse?: No Weak stream?: No Erection problems?: No Penile pain?: No  Gastrointestinal Nausea?: No Vomiting?: No Indigestion/heartburn?: No Diarrhea?: No Constipation?: No  Constitutional Fever: No Night sweats?: No Weight loss?: No Fatigue?: No  Skin  Skin rash/lesions?: No Itching?: Yes  Eyes Blurred vision?: No Double vision?: No  Ears/Nose/Throat Sore throat?: No Sinus problems?: Yes  Hematologic/Lymphatic Swollen glands?: No Easy bruising?: No  Cardiovascular Leg swelling?: No Chest pain?: No  Respiratory Cough?: No Shortness of breath?: No  Endocrine Excessive thirst?: No  Musculoskeletal Back pain?: Yes Joint pain?: No  Neurological Headaches?: No Dizziness?: No  Psychologic Depression?: No Anxiety?: No  Physical Exam: BP 114/69 (BP Location: Left Arm, Patient Position: Sitting, Cohen Size: Normal)   Pulse (!) 108   Ht 6\' 1"  (1.854 m)   Wt 149 lb 1.6 oz (67.6 kg)   BMI 19.67 kg/m   Constitutional:  Alert and oriented, No acute distress. HEENT: [place] AT, moist mucus membranes.  Trachea midline, no masses. Cardiovascular: No clubbing, cyanosis, or edema. Respiratory: Normal respiratory effort, no increased work of breathing. GI: Abdomen is soft, nontender, nondistended, no abdominal masses GU: No CVA tenderness Lymph: No cervical or inguinal lymphadenopathy. Skin: No rashes, bruises or suspicious lesions. Neurologic: Grossly intact, no focal deficits, moving all 4 extremities. Psychiatric: Normal mood and affect.   Assessment & Plan:    - Left renal mass Stable left renal mass.  I again discussed with Mr. Beitler that this lesion most likely represents a small renal cell carcinoma however based on stability continued surveillance is an acceptable option.  If he was not comfortable with this we discussed surgical excision, percutaneous ablation by IR and renal mass biopsy for risk stratification.  He has elected to continue surveillance and will obtain a follow-up renal ultrasound in 1 year.  - Prostate cancer screening PSA performed by his PCP September 2020 was stable at 1.9  Abbie Sons, MD  [facility] [address], [place], [place] [address] [phone]

## 2019-02-04 ENCOUNTER — Encounter: Payer: Self-pay | Admitting: Urology

## 2019-02-14 ENCOUNTER — Telehealth: Payer: Self-pay

## 2019-02-14 NOTE — Telephone Encounter (Signed)
Patient has been notified that lung cancer screening CT scan is due currently or will be in near future. Confirmed that patient is within the appropriate age range, and asymptomatic, (no signs or symptoms of lung cancer). Patient denies illness that would prevent curative treatment for lung cancer if found. Patient states that he is currently smoking 4 cigars per day and a pack per week of cigarettes.Morning hours work best for him and I gave him Jacqulynn Cadet number as he had some financial questions.

## 2019-02-20 ENCOUNTER — Telehealth: Payer: Self-pay

## 2019-02-25 ENCOUNTER — Other Ambulatory Visit: Payer: Self-pay | Admitting: Family Medicine

## 2019-02-26 ENCOUNTER — Telehealth: Payer: Self-pay | Admitting: *Deleted

## 2019-02-26 DIAGNOSIS — Z87891 Personal history of nicotine dependence: Secondary | ICD-10-CM

## 2019-02-26 DIAGNOSIS — Z122 Encounter for screening for malignant neoplasm of respiratory organs: Secondary | ICD-10-CM

## 2019-02-26 NOTE — Telephone Encounter (Signed)
Patient has been notified that annual lung cancer screening low dose CT scan is due currently or will be in near future. Confirmed that patient is within the age range of 55-77, and asymptomatic, (no signs or symptoms of lung cancer). Patient denies illness that would prevent curative treatment for lung cancer if found. Verified smoking history, (current, 32 pack year). The shared decision making visit was done 11/09/16. Patient is agreeable for CT scan being scheduled.

## 2019-03-07 ENCOUNTER — Ambulatory Visit
Admission: RE | Admit: 2019-03-07 | Discharge: 2019-03-07 | Disposition: A | Discharge status: Home / Self Care | Payer: Medicare Other | Source: Ambulatory Visit | Attending: Oncology | Admitting: Oncology

## 2019-03-07 ENCOUNTER — Other Ambulatory Visit: Payer: Self-pay

## 2019-03-07 DIAGNOSIS — Z87891 Personal history of nicotine dependence: Secondary | ICD-10-CM

## 2019-03-07 DIAGNOSIS — Z122 Encounter for screening for malignant neoplasm of respiratory organs: Secondary | ICD-10-CM | POA: Diagnosis not present

## 2019-03-08 ENCOUNTER — Telehealth: Payer: Self-pay | Admitting: *Deleted

## 2019-03-08 NOTE — Telephone Encounter (Signed)
Contacted patient and reviewed lung screening results with recommendation for evaluation in 3 months. Patient is in agreement with this plan. Message sent to his PCP as well.

## 2019-03-08 NOTE — Progress Notes (Signed)
Bridgett Larsson, NP 03/08/2019 9:02 AM

## 2019-03-23 DIAGNOSIS — E0520 Thyrotoxicosis with toxic multinodular goiter without thyrotoxic crisis or storm: Secondary | ICD-10-CM | POA: Diagnosis not present

## 2019-04-03 ENCOUNTER — Other Ambulatory Visit: Payer: Self-pay | Admitting: Internal Medicine

## 2019-04-03 DIAGNOSIS — E0510 Thyrotoxicosis with toxic single thyroid nodule without thyrotoxic crisis or storm: Secondary | ICD-10-CM

## 2019-04-09 ENCOUNTER — Ambulatory Visit: Payer: Medicare Other

## 2019-04-10 ENCOUNTER — Ambulatory Visit: Payer: Medicare Other

## 2019-04-13 ENCOUNTER — Ambulatory Visit: Payer: Medicare Other

## 2019-04-27 ENCOUNTER — Other Ambulatory Visit: Payer: Self-pay | Admitting: Family Medicine

## 2019-04-27 ENCOUNTER — Ambulatory Visit: Payer: Self-pay | Admitting: General Practice

## 2019-04-27 ENCOUNTER — Telehealth: Payer: Self-pay

## 2019-04-27 NOTE — Chronic Care Management (AMB) (Signed)
°  Care Management   Outreach Note  04/27/2019 Name: Tyler Cohen MRN: [id number] DOB: 1955-10-10  Referred by: Valerie Roys, DO Reason for referral : Care Coordination (Initial Call: HLD, no other qualiifying conditions)   An unsuccessful telephone outreach was attempted today. The patient was referred to the case management team by for assistance with care management and care coordination.   Follow Up Plan: The care management team will reach out to the patient again over the next 30 days.  The RNCM was unable to leave a message for the patient.   Noreene Larsson RN, MSN, [facility] Mobile: [phone]

## 2019-06-06 ENCOUNTER — Ambulatory Visit: Payer: Self-pay | Admitting: General Practice

## 2019-06-06 ENCOUNTER — Telehealth: Payer: Self-pay

## 2019-06-06 NOTE — Chronic Care Management (AMB) (Signed)
  Chronic Care Management   Outreach Note  06/06/2019 Name: [name] MRN: [id number] DOB: [birth year]  Referred by: Valerie Roys, DO Reason for referral : Chronic Care Management (Initial outreach: 2nd attempt)   A second unsuccessful telephone outreach was attempted today. The patient was referred to the case management team for assistance with care management and care coordination.   Follow Up Plan: The care management team will reach out to the patient again over the next 30 days. The recording states that the patient is not taking calls at this time.   Noreene Larsson RN, MSN, [facility] Mobile: [phone]

## 2019-06-22 ENCOUNTER — Telehealth: Payer: Self-pay | Admitting: *Deleted

## 2019-06-22 NOTE — Telephone Encounter (Signed)
Attempted to contact patient to schedule LCS nodule f/u. However there is no answer or voicemail option available.

## 2019-06-26 ENCOUNTER — Ambulatory Visit: Payer: Medicare Other | Admitting: Family Medicine

## 2019-06-26 NOTE — Progress Notes (Deleted)
   There were no vitals taken for this visit.   Subjective:    Patient ID: Tyler Cohen, male    DOB: 06/11/1955, 64 y.o.   MRN: [id number]  HPI: Tyler Cohen is a 64 y.o. male  No chief complaint on file.  HYPERTHYROIDISM Thyroid control status:{Blank single:19197::"controlled","uncontrolled","better","worse","exacerbated","stable"} Satisfied with current treatment? {Blank single:19197::"yes","no"} Medication side effects: {Blank single:19197::"yes","no"} Medication compliance: {Blank single:19197::"excellent compliance","good compliance","fair compliance","poor compliance"} Etiology of hypothyroidism:  Recent dose adjustment:{Blank single:19197::"yes","no"} Fatigue: {Blank single:19197::"yes","no"} Cold intolerance: {Blank single:19197::"yes","no"} Heat intolerance: {Blank single:19197::"yes","no"} Weight gain: {Blank single:19197::"yes","no"} Weight loss: {Blank single:19197::"yes","no"} Constipation: {Blank single:19197::"yes","no"} Diarrhea/loose stools: {Blank single:19197::"yes","no"} Palpitations: {Blank single:19197::"yes","no"} Lower extremity edema: {Blank single:19197::"yes","no"} Anxiety/depressed mood: {Blank single:19197::"yes","no"}  HYPERLIPIDEMIA Hyperlipidemia status: {Blank single:19197::"excellent compliance","good compliance","fair compliance","poor compliance"} Satisfied with current treatment?  {Blank single:19197::"yes","no"} Side effects:  {Blank single:19197::"yes","no"} Medication compliance: {Blank single:19197::"excellent compliance","good compliance","fair compliance","poor compliance"} Past cholesterol meds: {Blank multiple:19196::"none","atorvastain (lipitor)","lovastatin (mevacor)","pravastatin (pravachol)","rosuvastatin (crestor)","simvastatin (zocor)","vytorin","fenofibrate (tricor)","gemfibrozil","ezetimide (zetia)","niaspan","lovaza"} Supplements: {Blank multiple:19196::"none","fish oil","niacin","red yeast rice"} Aspirin:  {Blank  single:19197::"yes","no"} The 10-year ASCVD risk score Mikey Bussing DC Jr., et al., 2013) is: 9.7%   Values used to calculate the score:     Age: 57 years     Sex: Male     Is Non-Hispanic African American: Yes     Diabetic: No     Tobacco smoker: Yes     Systolic Blood Pressure: A999333 mmHg     Is BP treated: No     HDL Cholesterol: 46 mg/dL     Total Cholesterol: 137 mg/dL Chest pain:  {Blank single:19197::"yes","no"} Coronary artery disease:  {Blank single:19197::"yes","no"} Family history CAD:  {Blank single:19197::"yes","no"} Family history early CAD:  {Blank single:19197::"yes","no"}  BPH BPH status: {Blank single:19197::"controlled","uncontrolled","better","worse","exacerbated","stable"} Satisfied with current treatment?: {Blank single:19197::"yes","no"} Medication side effects: {Blank single:19197::"yes","no"} Medication compliance: {Blank single:19197::"excellent compliance","good compliance","fair compliance","poor compliance"} Duration: {Blank single:19197::"chronic","months","years"} Nocturia: {Blank single:19197::"no","1/night","1-2x per night","2-3x per night","3-4x per night","4-5x per night","5+ times per night"} Urinary frequency:{Blank single:19197::"yes","no"} Incomplete voiding: {Blank single:19197::"yes","no"} Urgency: {Blank single:19197::"yes","no"} Weak urinary stream: {Blank single:19197::"yes","no"} Straining to start stream: {Blank single:19197::"yes","no"} Dysuria: {Blank single:19197::"yes","no"} Onset: {Blank single:19197::"sudden","gradual"} Severity: {Blank single:19197::"mild","moderate","severe"} Alleviating factors:  Aggravating factors:  Treatments attempted:  AUA symptom score:   Relevant past medical, surgical, family and social history reviewed and updated as indicated. Interim medical history since our last visit reviewed. Allergies and medications reviewed and updated.  Review of Systems  Per HPI unless specifically indicated above      Objective:    There were no vitals taken for this visit.  Wt Readings from Last 3 Encounters:  03/07/19 158 lb (71.7 kg)  02/01/19 149 lb 1.6 oz (67.6 kg)  12/26/18 153 lb (69.4 kg)    Physical Exam  Results for orders placed or performed in visit on 01/27/19  Novel Coronavirus, NAA ([facility])   Specimen: Nasopharyngeal(NP) swabs in vial transport medium   NASOPHARYNGE  SCREENIN  Result Value Ref Range   SARS-CoV-2, NAA Not Detected Not Detected      Assessment & Plan:   Problem List Items Addressed This Visit      Endocrine   Hyperthyroidism - Primary     Genitourinary   BPH (benign prostatic hyperplasia)     Other   Hyperlipidemia   Macrocytosis       Follow up plan: No follow-ups on file.

## 2019-06-28 ENCOUNTER — Telehealth: Payer: Self-pay

## 2019-06-28 NOTE — Telephone Encounter (Signed)
Unsuccessful attempt at calling patient to notify them that it is time to schedule the low dose lung cancer screening CT scan. 

## 2019-07-01 ENCOUNTER — Ambulatory Visit: Payer: Medicare Other | Attending: Internal Medicine

## 2019-07-01 DIAGNOSIS — Z23 Encounter for immunization: Secondary | ICD-10-CM

## 2019-07-01 NOTE — Progress Notes (Signed)
   Covid-19 Vaccination Clinic  Name:  Tyler Cohen    MRN: [id number] DOB: 03/09/1956  07/01/2019  [name] was observed post Covid-19 immunization for 15 minutes without incident. He was provided with Vaccine Information Sheet and instruction to access the V-Safe system.   [name] was instructed to call 911 with any severe reactions post vaccine: Marland Kitchen Difficulty breathing  . Swelling of face and throat  . A fast heartbeat  . A bad rash all over body  . Dizziness and weakness   Immunizations Administered    Name Date Dose VIS Date Route   Pfizer COVID-19 Vaccine 07/01/2019  2:35 PM 0.3 mL 03/16/2019 Intramuscular   Manufacturer: [employer]   Lot: [id number]   [place]: [id number]

## 2019-07-07 ENCOUNTER — Telehealth: Payer: Self-pay

## 2019-07-07 NOTE — Telephone Encounter (Signed)
Patient notified that it is time to schedule the low dose lung cancer screening CT scan.  He states he does not want to have the CT scheduled.

## 2019-07-24 ENCOUNTER — Ambulatory Visit: Payer: Medicare Other | Attending: Internal Medicine

## 2019-07-24 ENCOUNTER — Ambulatory Visit (INDEPENDENT_AMBULATORY_CARE_PROVIDER_SITE_OTHER): Payer: Medicare Other | Admitting: General Practice

## 2019-07-24 DIAGNOSIS — E7800 Pure hypercholesterolemia, unspecified: Secondary | ICD-10-CM

## 2019-07-24 DIAGNOSIS — E0590 Thyrotoxicosis, unspecified without thyrotoxic crisis or storm: Secondary | ICD-10-CM

## 2019-07-24 DIAGNOSIS — Z720 Tobacco use: Secondary | ICD-10-CM

## 2019-07-24 DIAGNOSIS — Z23 Encounter for immunization: Secondary | ICD-10-CM

## 2019-07-24 NOTE — Patient Instructions (Signed)
Visit Information  Goals Addressed            This Visit's Progress   . RNCM-pt"I try to eat healthy" (pt-stated)       CARE PLAN ENTRY (see longtitudinal plan of care for additional care plan information)  Current Barriers:  . Chronic Disease Management support, education, and care coordination needs related to HLD and Hyperthyroidism   Clinical Goal(s) related to HLD and Hyperthyroidism  :  Over the next 120 days, patient will:  . Work with the care management team to address educational, disease management, and care coordination needs  . Begin or continue self health monitoring activities as directed today  adhere to a heart healthy diet . Call provider office for new or worsened signs and symptoms Oxygen saturation lower than established parameter, Shortness of breath, and New or worsened symptom related to HLD, Hyperthyroidism and other chronic conditions  . Call care management team with questions or concerns . Verbalize basic understanding of patient centered plan of care established today  Interventions related to HLD and Hyperthyroidism :  . Evaluation of current treatment plans and patient's adherence to plan as established by provider . Assessed patient understanding of disease states . Assessed patient's education and care coordination needs . Provided disease specific education to patient. Review of heart healthy diet. The patient verbalized he tries to eat healthy.  . Evaluation of exercise regimen. The patient does not do any structured exercise but states he does mow yards.  . Care guide referral for help with adequate housing. The patient states today he does not have anywhere to live.  Nash Dimmer with appropriate clinical care team members regarding patient needs  Patient Self Care Activities related to HLD and Hyperthyroidism :  . Patient is unable to independently self-manage chronic health conditions  Initial goal documentation        Mr. Titzer was  given information about Chronic Care Management services today including:  1. CCM service includes personalized support from designated clinical staff supervised by his physician, including individualized plan of care and coordination with other care providers 2. 24/7 contact phone numbers for assistance for urgent and routine care needs. 3. Service will only be billed when office clinical staff spend 20 minutes or more in a month to coordinate care. 4. Only one practitioner may furnish and bill the service in a calendar month. 5. The patient may stop CCM services at any time (effective at the end of the month) by phone call to the office staff. 6. The patient will be responsible for cost sharing (co-pay) of up to 20% of the service fee (after annual deductible is met).  Patient agreed to services and verbal consent obtained.   Patient verbalizes understanding of instructions provided today.   The care management team will reach out to the patient again over the next 60 days.   Noreene Larsson RN, MSN, [facility] Mobile: [phone]

## 2019-07-24 NOTE — Chronic Care Management (AMB) (Signed)
Chronic Care Management   Initial Visit Note  07/24/2019 Name: Tyler Cohen MRN: [id number] DOB: 1955-09-18  Referred by: Valerie Roys, DO Reason for referral : Chronic Care Management (RNCM Chronic Disease management and Care coordination needs)   Tyler Cohen is a 64 y.o. year old male who is a primary care patient of Valerie Roys, DO. The CCM team was consulted for assistance with chronic disease management and care coordination needs related to HLD, Homelessness and Hyperthroidism   Review of patient status, including review of consultants reports, relevant laboratory and other test results, and collaboration with appropriate care team members and the patient's provider was performed as part of comprehensive patient evaluation and provision of chronic care management services.    SDOH (Social Determinants of Health) assessments performed: Yes See Care Plan activities for detailed interventions related to SDOH  SDOH Interventions     Most Recent Value  SDOH Interventions  SDOH Interventions for the Following Domains  Physical Activity, Tobacco, Housing  Housing Interventions  Other (Comment) [care guide referral for housing]  Physical Activity Interventions  Other (Comments) [says he mows yards but no structured activity]  Tobacco Interventions  Patient Refused [smokes about 8 cigars a week]       Medications: Outpatient Encounter Medications as of 07/24/2019  Medication Sig   albuterol (VENTOLIN HFA) 108 (90 Base) MCG/ACT inhaler Inhale 2 puffs into the lungs every 6 (six) hours as needed for wheezing or shortness of breath.   aspirin 81 MG tablet Take 81 mg by mouth daily.   atorvastatin (LIPITOR) 20 MG tablet TAKE 1 TABLET(20 MG) BY MOUTH AT BEDTIME   b complex vitamins tablet Take 1 tablet by mouth daily.   calcipotriene (DOVONOX) 0.005 % cream Apply topically 2 (two) times daily.   diflorasone (PSORCON) 0.05 % ointment as needed.    econazole nitrate 1 %  cream as needed.    methimazole (TAPAZOLE) 10 MG tablet Take by mouth daily.    propylthiouracil (PTU) 50 MG tablet Take by mouth 2 (two) times daily.    tamsulosin (FLOMAX) 0.4 MG CAPS capsule TAKE 1 CAPSULE(0.4 MG) BY MOUTH DAILY   No facility-administered encounter medications on file as of 07/24/2019.     Objective:   BP Readings from Last 3 Encounters:  02/01/19 114/69  12/26/18 109/70  10/27/18 137/84   Goals Addressed            This Visit's Progress    RNCM-pt"I try to eat healthy" (pt-stated)       CARE PLAN ENTRY (see longtitudinal plan of care for additional care plan information)  Current Barriers:   Chronic Disease Management support, education, and care coordination needs related to HLD and Hyperthyroidism   Clinical Goal(s) related to HLD and Hyperthyroidism  :  Over the next 120 days, patient will:   Work with the care management team to address educational, disease management, and care coordination needs   Begin or continue self health monitoring activities as directed today  adhere to a heart healthy diet  Call provider office for new or worsened signs and symptoms Oxygen saturation lower than established parameter, Shortness of breath, and New or worsened symptom related to HLD, Hyperthyroidism and other chronic conditions   Call care management team with questions or concerns  Verbalize basic understanding of patient centered plan of care established today  Interventions related to HLD and Hyperthyroidism :   Evaluation of current treatment plans and patient's adherence to plan as established  by provider  Assessed patient understanding of disease states  Assessed patient's education and care coordination needs  Provided disease specific education to patient. Review of heart healthy diet. The patient verbalized he tries to eat healthy.   Evaluation of exercise regimen. The patient does not do any structured exercise but states he does mow  yards.   Care guide referral for help with adequate housing. The patient states today he does not have anywhere to live.   Collaborated with appropriate clinical care team members regarding patient needs  Patient Self Care Activities related to HLD and Hyperthyroidism :   Patient is unable to independently self-manage chronic health conditions  Initial goal documentation         Mr. Hollibaugh was given information about Chronic Care Management services today including:  1. CCM service includes personalized support from designated clinical staff supervised by his physician, including individualized plan of care and coordination with other care providers 2. 24/7 contact phone numbers for assistance for urgent and routine care needs. 3. Service will only be billed when office clinical staff spend 20 minutes or more in a month to coordinate care. 4. Only one practitioner may furnish and bill the service in a calendar month. 5. The patient may stop CCM services at any time (effective at the end of the month) by phone call to the office staff. 6. The patient will be responsible for cost sharing (co-pay) of up to 20% of the service fee (after annual deductible is met).  Patient agreed to services and verbal consent obtained.   Plan:   The care management team will reach out to the patient again over the next 60 days.   Noreene Larsson RN, MSN, [facility] Mobile: [phone]

## 2019-07-24 NOTE — Progress Notes (Signed)
   Covid-19 Vaccination Clinic  Name:  Tyler Cohen    MRN: [id number] DOB: Apr 26, 1955  07/24/2019  [name] was observed post Covid-19 immunization for 15 minutes without incident. He was provided with Vaccine Information Sheet and instruction to access the V-Safe system.   [name] was instructed to call 911 with any severe reactions post vaccine: Marland Kitchen Difficulty breathing  . Swelling of face and throat  . A fast heartbeat  . A bad rash all over body  . Dizziness and weakness   Immunizations Administered    Name Date Dose VIS Date Route   Pfizer COVID-19 Vaccine 07/24/2019  2:16 PM 0.3 mL 05/30/2018 Intramuscular   Manufacturer: [employer]   Lot: [id number]   [place]: [id number]

## 2019-07-25 ENCOUNTER — Telehealth: Payer: Self-pay

## 2019-07-27 ENCOUNTER — Telehealth: Payer: Self-pay | Admitting: Family Medicine

## 2019-07-27 NOTE — Telephone Encounter (Signed)
  Community Resource Referral   [place] 07/27/2019 1st Attempt   Name: Tyler Cohen   MRN: [id number]   DOB: 09/14/1955   AGE: 64 y.o.   GENDER: male   PCP Park Liter P, DO.   Called pt regarding Community Resource Referral pt was driving asked that I call him back on Tuesday morning Follow up on Tuesday: 4/27  [place] . [email or web address]  [phone]

## 2019-08-02 NOTE — Telephone Encounter (Signed)
  Community Resource Referral   [place] 08/02/2019   Name: Tyler Cohen   MRN: [id number]   DOB: 1955-05-07   AGE: 64 y.o.   GENDER: male   PCP Park Liter P, DO.   Called pt regarding Data processing manager Referral for affordable housing in [place]. [name] stated that is wife is in skilled nursing facility and is looking for a 2 br rental for the both of them once she gets out. Asked that I mail him a list of income based housing to his mailing address.   [place] . [email or web address]  [phone]

## 2019-08-09 ENCOUNTER — Telehealth: Payer: Self-pay | Admitting: *Deleted

## 2019-08-09 NOTE — Telephone Encounter (Signed)
In past contact patient has reported that he does not want lung screening scan. Attempted to contact patient and listed patient contact. No answer at either number and no voicemail at patient's number. Voicemail left for contact, Azell Der for patient to call me back to discuss importance of follow up of suspicious finding.

## 2019-08-26 NOTE — Telephone Encounter (Signed)
Overdue for follow up. Pls schedule

## 2019-08-27 NOTE — Telephone Encounter (Signed)
Pt is scheduled for 09/10/2019, Pt did not want to be seen sooner by another provider. Pt verbalized understanding.

## 2019-09-10 ENCOUNTER — Encounter: Payer: Self-pay | Admitting: Family Medicine

## 2019-09-10 ENCOUNTER — Ambulatory Visit (INDEPENDENT_AMBULATORY_CARE_PROVIDER_SITE_OTHER): Payer: Medicare Other | Admitting: Family Medicine

## 2019-09-10 ENCOUNTER — Other Ambulatory Visit: Payer: Self-pay

## 2019-09-10 VITALS — BP 114/73 | HR 87 | Temp 98.4°F | Ht 72.05 in | Wt 149.2 lb

## 2019-09-10 DIAGNOSIS — M79671 Pain in right foot: Secondary | ICD-10-CM

## 2019-09-10 DIAGNOSIS — D7589 Other specified diseases of blood and blood-forming organs: Secondary | ICD-10-CM

## 2019-09-10 DIAGNOSIS — E7800 Pure hypercholesterolemia, unspecified: Secondary | ICD-10-CM

## 2019-09-10 DIAGNOSIS — H6123 Impacted cerumen, bilateral: Secondary | ICD-10-CM

## 2019-09-10 DIAGNOSIS — E0590 Thyrotoxicosis, unspecified without thyrotoxic crisis or storm: Secondary | ICD-10-CM | POA: Diagnosis not present

## 2019-09-10 DIAGNOSIS — R350 Frequency of micturition: Secondary | ICD-10-CM | POA: Diagnosis not present

## 2019-09-10 DIAGNOSIS — R9389 Abnormal findings on diagnostic imaging of other specified body structures: Secondary | ICD-10-CM | POA: Diagnosis not present

## 2019-09-10 DIAGNOSIS — N401 Enlarged prostate with lower urinary tract symptoms: Secondary | ICD-10-CM | POA: Diagnosis not present

## 2019-09-10 MED ORDER — ATORVASTATIN CALCIUM 20 MG PO TABS: ORAL_TABLET | ORAL | 1 refills | Status: DC

## 2019-09-10 MED ORDER — NAPROXEN 500 MG PO TABS
500.0000 mg | ORAL_TABLET | Freq: Two times a day (BID) | ORAL | 3 refills | Status: DC | PRN
Start: 2019-09-10 — End: 2019-11-20

## 2019-09-10 MED ORDER — TAMSULOSIN HCL 0.4 MG PO CAPS: ORAL_CAPSULE | ORAL | 1 refills | Status: DC

## 2019-09-10 MED ORDER — ALBUTEROL SULFATE HFA 108 (90 BASE) MCG/ACT IN AERS: 2.0000 | INHALATION_SPRAY | Freq: Four times a day (QID) | RESPIRATORY_TRACT | 6 refills | Status: DC | PRN

## 2019-09-10 NOTE — Progress Notes (Signed)
BP 114/73 (BP Location: Left Arm, Patient Position: Sitting, Cohen Size: Normal)   Pulse 87   Temp 98.4 F (36.9 C) (Oral)   Ht 6' 0.05" (1.83 m)   Wt 149 lb 3.2 oz (67.7 kg)   SpO2 100%   BMI 20.21 kg/m    Subjective:    Patient ID: Tyler Cohen, male    DOB: 06-13-1955, 64 y.o.   MRN: [id number]  HPI: Tyler Cohen is a 64 y.o. male  Chief Complaint  Patient presents with  . Hyperlipidemia  . Hyperthyroidism  . Ear Pain    right   HYPERLIPIDEMIA Hyperlipidemia status: stable Satisfied with current treatment?  yes Side effects:  no Medication compliance: excellent compliance Past cholesterol meds: atorvastatin Supplements: none Aspirin:  yes The ASCVD Risk score Mikey Bussing DC Jr., et al., 2013) failed to calculate for the following reasons:   The valid total cholesterol range is 130 to 320 mg/dL Chest pain:  no  HYPERTHYROIDISM- has been following with Dr. Gabriel Carina, has not seen her since December. Has not been taking his medicine. He states that he has been taking his medicine because he stopped taking it. He thinks that he stopped it in the spring Thyroid control status:uncontrolled Satisfied with current treatment? no Medication side effects: no Medication compliance: poor compliance Fatigue: no Cold intolerance: no Heat intolerance: no Weight gain: no Weight loss: yes Constipation: no Diarrhea/loose stools: no Palpitations: no Lower extremity edema: no Anxiety/depressed mood: no  EAR PAIN Duration: months Involved ear(s): right Severity:  moderate  Quality:  Aching, nagging Fever: no Otorrhea: no Upper respiratory infection symptoms: no Pruritus: no Hearing loss: no Water immersion no Using Q-tips: no Recurrent otitis media: no Status: stable Treatments attempted: none  Relevant past medical, surgical, family and social history reviewed and updated as indicated. Interim medical history since our last visit reviewed. Allergies and medications  reviewed and updated.  Review of Systems  Constitutional: Positive for unexpected weight change. Negative for activity change, appetite change, chills, diaphoresis, fatigue and fever.  HENT: Negative.   Respiratory: Negative.   Cardiovascular: Negative.   Gastrointestinal: Negative.   Genitourinary: Negative.   Musculoskeletal: Negative.   Neurological: Negative.   Psychiatric/Behavioral: Negative.     Per HPI unless specifically indicated above     Objective:    BP 114/73 (BP Location: Left Arm, Patient Position: Sitting, Cohen Size: Normal)   Pulse 87   Temp 98.4 F (36.9 C) (Oral)   Ht 6' 0.05" (1.83 m)   Wt 149 lb 3.2 oz (67.7 kg)   SpO2 100%   BMI 20.21 kg/m   Wt Readings from Last 3 Encounters:  09/10/19 149 lb 3.2 oz (67.7 kg)  03/07/19 158 lb (71.7 kg)  02/01/19 149 lb 1.6 oz (67.6 kg)    Physical Exam Vitals and nursing note reviewed.  Constitutional:      General: He is not in acute distress.    Appearance: Normal appearance. He is not ill-appearing, toxic-appearing or diaphoretic.  HENT:     Head: Normocephalic and atraumatic.     Right Ear: External ear normal. There is impacted cerumen.     Left Ear: External ear normal. There is impacted cerumen.     Nose: Nose normal.     Mouth/Throat:     Mouth: Mucous membranes are moist.     Pharynx: Oropharynx is clear.     Comments: Poor dentition Eyes:     General: No scleral icterus.  Right eye: No discharge.        Left eye: No discharge.     Extraocular Movements: Extraocular movements intact.     Conjunctiva/sclera: Conjunctivae normal.     Pupils: Pupils are equal, round, and reactive to light.  Cardiovascular:     Rate and Rhythm: Normal rate and regular rhythm.     Pulses: Normal pulses.     Heart sounds: Normal heart sounds. No murmur heard.  No friction rub. No gallop.   Pulmonary:     Effort: Pulmonary effort is normal. No respiratory distress.     Breath sounds: Normal breath sounds. No  stridor. No wheezing, rhonchi or rales.  Chest:     Chest wall: No tenderness.  Musculoskeletal:        General: Normal range of motion.     Cervical back: Normal range of motion and neck supple.  Skin:    General: Skin is warm and dry.     Capillary Refill: Capillary refill takes less than 2 seconds.     Coloration: Skin is not jaundiced or pale.     Findings: No bruising, erythema, lesion or rash.  Neurological:     General: No focal deficit present.     Mental Status: He is alert and oriented to person, place, and time. Mental status is at baseline.  Psychiatric:        Mood and Affect: Mood normal.        Behavior: Behavior normal.        Thought Content: Thought content normal.        Judgment: Judgment normal.     Results for orders placed or performed in visit on 09/10/19  CBC with Differential/Platelet  Result Value Ref Range   WBC 6.0 3.4 - 10.8 x10E3/uL   RBC 4.72 4.14 - 5.80 x10E6/uL   Hemoglobin 14.5 13.0 - 17.7 g/dL   Hematocrit 43.4 37.5 - 51.0 %   MCV 92 79 - 97 fL   MCH 30.7 26.6 - 33.0 pg   MCHC 33.4 31 - 35 g/dL   RDW 12.1 11.6 - 15.4 %   Platelets 189 150 - 450 x10E3/uL   Neutrophils 44 Not Estab. %   Lymphs 44 Not Estab. %   Monocytes 8 Not Estab. %   Eos 3 Not Estab. %   Basos 1 Not Estab. %   Neutrophils Absolute 2.7 1 - 7 x10E3/uL   Lymphocytes Absolute 2.7 0 - 3 x10E3/uL   Monocytes Absolute 0.5 0 - 0 x10E3/uL   EOS (ABSOLUTE) 0.2 0.0 - 0.4 x10E3/uL   Basophils Absolute 0.0 0 - 0 x10E3/uL   Immature Granulocytes 0 Not Estab. %   Immature Grans (Abs) 0.0 0.0 - 0.1 x10E3/uL  Comprehensive metabolic panel  Result Value Ref Range   Glucose 96 65 - 99 mg/dL   BUN 14 8 - 27 mg/dL   Creatinine, Ser 0.87 0.76 - 1.27 mg/dL   GFR calc non Af Amer 92 >59 mL/min/1.73   GFR calc Af Amer 106 >59 mL/min/1.73   BUN/Creatinine Ratio 16 10 - 24   Sodium 138 134 - 144 mmol/L   Potassium 4.4 3.5 - 5.2 mmol/L   Chloride 104 96 - 106 mmol/L   CO2 21 20 - 29  mmol/L   Calcium 9.6 8.6 - 10.2 mg/dL   Total Protein 6.4 6.0 - 8.5 g/dL   Albumin 3.9 3.8 - 4.8 g/dL   Globulin, Total 2.5 1.5 - 4.5 g/dL   Albumin/Globulin  Ratio 1.6 1.2 - 2.2   Bilirubin Total 0.5 0.0 - 1.2 mg/dL   Alkaline Phosphatase 145 (H) 48 - 121 IU/L   AST 18 0 - 40 IU/L   ALT 24 0 - 44 IU/L  Lipid Panel w/o Chol/HDL Ratio  Result Value Ref Range   Cholesterol, Total 128 100 - 199 mg/dL   Triglycerides 54 0 - 149 mg/dL   HDL 36 (L) >39 mg/dL   VLDL Cholesterol Cal 12 5 - 40 mg/dL   LDL Chol Calc (NIH) 80 0 - 99 mg/dL  PSA  Result Value Ref Range   Prostate Specific Ag, Serum 3.6 0.0 - 4.0 ng/mL  TSH  Result Value Ref Range   TSH <0.005 (L) 0.450 - 4.500 uIU/mL      Assessment & Plan:   Problem List Items Addressed This Visit      Endocrine   Hyperthyroidism - Primary    Encouraged him to restart his medicine and follow up with Dr. Gabriel Carina. Recheck labs today. Await results.       Relevant Orders   Comprehensive metabolic panel (Completed)   TSH (Completed)     Genitourinary   BPH (benign prostatic hyperplasia)    Under good control on current regimen. Continue current regimen. Continue to monitor. Call with any concerns. Refills given. Labs drawn today.       Relevant Medications   tamsulosin (FLOMAX) 0.4 MG CAPS capsule   Other Relevant Orders   Comprehensive metabolic panel (Completed)   PSA (Completed)     Other   Hyperlipidemia    Under good control on current regimen. Continue current regimen. Continue to monitor. Call with any concerns. Refills given. Labs drawn today.       Relevant Medications   atorvastatin (LIPITOR) 20 MG tablet   Other Relevant Orders   Comprehensive metabolic panel (Completed)   Lipid Panel w/o Chol/HDL Ratio (Completed)   Macrocytosis    Checking labs. Await results.       Relevant Orders   CBC with Differential/Platelet (Completed)   Comprehensive metabolic panel (Completed)    Other Visit Diagnoses     Abnormal CT of the chest       Refuses follow up CT. Has had 9 lb weight loss in the last 6 months. States that he will call them when he is ready. Continue to monitor.    Bilateral impacted cerumen       Will use debrox in his ears for 2 weeks, then come back in for ear flush if needed.    Right foot pain       Will give stretches and have him take PRN naproxen. Call with any concerns or if not getting better.        Follow up plan: Return in about 2 weeks (around 09/24/2019) for ear flush, then 4 months physical.

## 2019-09-10 NOTE — Patient Instructions (Addendum)
Debrox- put in each of your ears for 15 minutes daily to soften the wax then come back in in 2 weeks and we'll flush them   Hyperthyroidism  Hyperthyroidism is when the thyroid gland is too active (overactive). The thyroid gland is a small gland located in the lower front part of the neck, just in front of the windpipe (trachea). This gland makes hormones that help control how the body uses food for energy (metabolism) as well as how the heart and brain function. These hormones also play a role in keeping your bones strong. When the thyroid is overactive, it produces too much of a hormone called thyroxine. What are the causes? This condition may be caused by:  Graves' disease. This is a disorder in which the body's disease-fighting system (immune system) attacks the thyroid gland. This is the most common cause.  Inflammation of the thyroid gland.  A tumor in the thyroid gland.  Use of certain medicines, including: ? Prescription thyroid hormone replacement. ? Herbal supplements that mimic thyroid hormones. ? Amiodarone therapy.  Solid or fluid-filled lumps within your thyroid gland (thyroid nodules).  Taking in a large amount of iodine from foods or medicines. What increases the risk? You are more likely to develop this condition if:  You are male.  You have a family history of thyroid conditions.  You smoke tobacco.  You use a medicine called lithium.  You take medicines that affect the immune system (immunosuppressants). What are the signs or symptoms? Symptoms of this condition include:  Nervousness.  Inability to tolerate heat.  Unexplained weight loss.  Diarrhea.  Change in the texture of hair or skin.  Heart skipping beats or making extra beats.  Rapid heart rate.  Loss of menstruation.  Shaky hands.  Fatigue.  Restlessness.  Sleep problems.  Enlarged thyroid gland or a lump in the thyroid (nodule). You may also have symptoms of Graves' disease,  which may include:  Protruding eyes.  Dry eyes.  Red or swollen eyes.  Problems with vision. How is this diagnosed? This condition may be diagnosed based on:  Your symptoms and medical history.  A physical exam.  Blood tests.  Thyroid ultrasound. This test involves using sound waves to produce images of the thyroid gland.  A thyroid scan. A radioactive substance is injected into a vein, and images show how much iodine is present in the thyroid.  Radioactive iodine uptake test (RAIU). A small amount of radioactive iodine is given by mouth to see how much iodine the thyroid absorbs after a certain amount of time. How is this treated? Treatment depends on the cause and severity of the condition. Treatment may include:  Medicines to reduce the amount of thyroid hormone your body makes.  Radioactive iodine treatment (radioiodine therapy). This involves swallowing a small dose of radioactive iodine, in capsule or liquid form, to kill thyroid cells.  Surgery to remove part or all of your thyroid gland. You may need to take thyroid hormone replacement medicine for the rest of your life after thyroid surgery.  Medicines to help manage your symptoms. Follow these instructions at home:   Take over-the-counter and prescription medicines only as told by your health care provider.  Do not use any products that contain nicotine or tobacco, such as cigarettes and e-cigarettes. If you need help quitting, ask your health care provider.  Follow any instructions from your health care provider about diet. You may be instructed to limit foods that contain iodine.  Keep all  follow-up visits as told by your health care provider. This is important. ? You will need to have blood tests regularly so that your health care provider can monitor your condition. Contact a health care provider if:  Your symptoms do not get better with treatment.  You have a fever.  You are taking thyroid hormone  replacement medicine and you: ? Have symptoms of depression. ? Feel like you are tired all the time. ? Gain weight. Get help right away if:  You have chest pain.  You have decreased alertness or a change in your awareness.  You have abdominal pain.  You feel dizzy.  You have a rapid heartbeat.  You have an irregular heartbeat.  You have difficulty breathing. Summary  The thyroid gland is a small gland located in the lower front part of the neck, just in front of the windpipe (trachea).  Hyperthyroidism is when the thyroid gland is too active (overactive) and produces too much of a hormone called thyroxine.  The most common cause is Graves' disease, a disorder in which your immune system attacks the thyroid gland.  Hyperthyroidism can cause various symptoms, such as unexplained weight loss, nervousness, inability to tolerate heat, or changes in your heartbeat.  Treatment may include medicine to reduce the amount of thyroid hormone your body makes, radioiodine therapy, surgery, or medicines to manage symptoms. This information is not intended to replace advice given to you by your health care provider. Make sure you discuss any questions you have with your health care provider. Document Revised: 03/04/2017 Document Reviewed: 03/02/2017 Elsevier Patient Education  2020 [place].  Plantar Fasciitis Rehab Ask your health care provider which exercises are safe for you. Do exercises exactly as told by your health care provider and adjust them as directed. It is normal to feel mild stretching, pulling, tightness, or discomfort as you do these exercises. Stop right away if you feel sudden pain or your pain gets worse. Do not begin these exercises until told by your health care provider. Stretching and range-of-motion exercises These exercises warm up your muscles and joints and improve the movement and flexibility of your foot. These exercises also help to relieve pain. Plantar  fascia stretch  1. Sit with your left / right leg crossed over your opposite knee. 2. Hold your heel with one hand with that thumb near your arch. With your other hand, hold your toes and gently pull them back toward the top of your foot. You should feel a stretch on the bottom of your toes or your foot (plantar fascia) or both. 3. Hold this stretch for__________ seconds. 4. Slowly release your toes and return to the starting position. Repeat __________ times. Complete this exercise __________ times a day. Gastrocnemius stretch, standing This exercise is also called a calf (gastroc) stretch. It stretches the muscles in the back of the upper calf. 1. Stand with your hands against a wall. 2. Extend your left / right leg behind you, and bend your front knee slightly. 3. Keeping your heels on the floor and your back knee straight, shift your weight toward the wall. Do not arch your back. You should feel a gentle stretch in your upper left / right calf. 4. Hold this position for __________ seconds. Repeat __________ times. Complete this exercise __________ times a day. Soleus stretch, standing This exercise is also called a calf (soleus) stretch. It stretches the muscles in the back of the lower calf. 1. Stand with your hands against a wall. 2.  Extend your left / right leg behind you, and bend your front knee slightly. 3. Keeping your heels on the floor, bend your back knee and shift your weight slightly over your back leg. You should feel a gentle stretch deep in your lower calf. 4. Hold this position for __________ seconds. Repeat __________ times. Complete this exercise __________ times a day. Gastroc and soleus stretch, standing step This exercise stretches the muscles in the back of the lower leg. These muscles are in the upper calf (gastrocnemius) and the lower calf (soleus). 1. Stand with the ball of your left / right foot on a step. The ball of your foot is on the walking surface, right  under your toes. 2. Keep your other foot firmly on the same step. 3. Hold on to the wall or a railing for balance. 4. Slowly lift your other foot, allowing your body weight to press your left / right heel down over the edge of the step. You should feel a stretch in your left / right calf. 5. Hold this position for __________ seconds. 6. Return both feet to the step. 7. Repeat this exercise with a slight bend in your left / right knee. Repeat __________ times with your left / right knee straight and __________ times with your left / right knee bent. Complete this exercise __________ times a day. Balance exercise This exercise builds your balance and strength control of your arch to help take pressure off your plantar fascia. Single leg stand If this exercise is too easy, you can try it with your eyes closed or while standing on a pillow. 1. Without shoes, stand near a railing or in a doorway. You may hold on to the railing or door frame as needed. 2. Stand on your left / right foot. Keep your big toe down on the floor and try to keep your arch lifted. Do not let your foot roll inward. 3. Hold this position for __________ seconds. Repeat __________ times. Complete this exercise __________ times a day. This information is not intended to replace advice given to you by your health care provider. Make sure you discuss any questions you have with your health care provider. Document Revised: 07/13/2018 Document Reviewed: 01/18/2018 Elsevier Patient Education  [place].

## 2019-09-11 ENCOUNTER — Encounter: Payer: Self-pay | Admitting: Family Medicine

## 2019-09-11 LAB — COMPREHENSIVE METABOLIC PANEL
ALT: 24 IU/L (ref 0–44)
AST: 18 IU/L (ref 0–40)
Albumin/Globulin Ratio: 1.6 (ref 1.2–2.2)
Albumin: 3.9 g/dL (ref 3.8–4.8)
Alkaline Phosphatase: 145 IU/L — ABNORMAL HIGH (ref 48–121)
BUN/Creatinine Ratio: 16 (ref 10–24)
BUN: 14 mg/dL (ref 8–27)
Bilirubin Total: 0.5 mg/dL (ref 0.0–1.2)
CO2: 21 mmol/L (ref 20–29)
Calcium: 9.6 mg/dL (ref 8.6–10.2)
Chloride: 104 mmol/L (ref 96–106)
Creatinine, Ser: 0.87 mg/dL (ref 0.76–1.27)
GFR calc Af Amer: 106 mL/min/{1.73_m2} (ref >59)
GFR calc non Af Amer: 92 mL/min/{1.73_m2} (ref >59)
Globulin, Total: 2.5 g/dL (ref 1.5–4.5)
Glucose: 96 mg/dL (ref 65–99)
Potassium: 4.4 mmol/L (ref 3.5–5.2)
Sodium: 138 mmol/L (ref 134–144)
Total Protein: 6.4 g/dL (ref 6.0–8.5)

## 2019-09-11 LAB — LIPID PANEL W/O CHOL/HDL RATIO
Cholesterol, Total: 128 mg/dL (ref 100–199)
HDL: 36 mg/dL — ABNORMAL LOW (ref >39)
LDL Chol Calc (NIH): 80 mg/dL (ref 0–99)
Triglycerides: 54 mg/dL (ref 0–149)
VLDL Cholesterol Cal: 12 mg/dL (ref 5–40)

## 2019-09-11 LAB — CBC WITH DIFFERENTIAL/PLATELET
Basophils Absolute: 0 10*3/uL (ref 0.0–0.2)
Basos: 1 %
EOS (ABSOLUTE): 0.2 10*3/uL (ref 0.0–0.4)
Eos: 3 %
Hematocrit: 43.4 % (ref 37.5–51.0)
Hemoglobin: 14.5 g/dL (ref 13.0–17.7)
Immature Grans (Abs): 0 10*3/uL (ref 0.0–0.1)
Immature Granulocytes: 0 %
Lymphocytes Absolute: 2.7 10*3/uL (ref 0.7–3.1)
Lymphs: 44 %
MCH: 30.7 pg (ref 26.6–33.0)
MCHC: 33.4 g/dL (ref 31.5–35.7)
MCV: 92 fL (ref 79–97)
Monocytes Absolute: 0.5 10*3/uL (ref 0.1–0.9)
Monocytes: 8 %
Neutrophils Absolute: 2.7 10*3/uL (ref 1.4–7.0)
Neutrophils: 44 %
Platelets: 189 10*3/uL (ref 150–450)
RBC: 4.72 x10E6/uL (ref 4.14–5.80)
RDW: 12.1 % (ref 11.6–15.4)
WBC: 6 10*3/uL (ref 3.4–10.8)

## 2019-09-11 LAB — PSA: Prostate Specific Ag, Serum: 3.6 ng/mL (ref 0.0–4.0)

## 2019-09-11 LAB — TSH: TSH: <0.005 u[IU]/mL — ABNORMAL LOW (ref 0.450–4.500)

## 2019-09-19 NOTE — Assessment & Plan Note (Signed)
Checking labs. Await results.  

## 2019-09-19 NOTE — Assessment & Plan Note (Signed)
Under good control on current regimen. Continue current regimen. Continue to monitor. Call with any concerns. Refills given. Labs drawn today.   

## 2019-09-19 NOTE — Assessment & Plan Note (Signed)
Encouraged him to restart his medicine and follow up with Dr. Gabriel Carina. Recheck labs today. Await results.

## 2019-09-21 ENCOUNTER — Telehealth: Payer: Self-pay

## 2019-09-25 ENCOUNTER — Ambulatory Visit (INDEPENDENT_AMBULATORY_CARE_PROVIDER_SITE_OTHER): Payer: Medicare Other | Admitting: Family Medicine

## 2019-09-25 ENCOUNTER — Other Ambulatory Visit: Payer: Self-pay

## 2019-09-25 ENCOUNTER — Encounter: Payer: Self-pay | Admitting: Family Medicine

## 2019-09-25 VITALS — BP 121/67 | HR 85 | Temp 98.2°F | Ht 72.64 in | Wt 152.8 lb

## 2019-09-25 DIAGNOSIS — H6123 Impacted cerumen, bilateral: Secondary | ICD-10-CM

## 2019-09-25 NOTE — Progress Notes (Signed)
Did not use his debrox. Cerumen still very hard. Will pick up debrox and flush ears in 2 weeks.

## 2019-10-09 ENCOUNTER — Ambulatory Visit: Payer: Medicare Other | Admitting: Family Medicine

## 2019-10-09 ENCOUNTER — Ambulatory Visit: Payer: Medicare Other | Admitting: Nurse Practitioner

## 2019-10-09 NOTE — Progress Notes (Deleted)
There were no vitals taken for this visit.   Subjective:    Patient ID: Tyler Cohen, male    DOB: [birth year], 64 y.o.   MRN: [id number]  HPI: Tyler Cohen is a 64 y.o. male  No chief complaint on file.  EAR PAIN Duration: {Blank single:19197::"days","weeks","months"} Involved ear(s): {Blank single:19197::"left","right","bilateral"} Severity:  {Blank single:19197::"mild","moderate","severe","1/10","2/10","3/10","4/10","5/10","6/10","7/10","8/10","9/10","10/10"}  Quality:  {Blank multiple:19196::"sharp","dull","aching","burning","cramping","ill-defined","itchy","pressure-like","pulling","shooting","sore","stabbing","tender","tearing","throbbing"} Fever: {Blank single:19197::"yes","no"} Otorrhea: {Blank single:19197::"yes","no"} Upper respiratory infection symptoms: {Blank single:19197::"yes","no"} Pruritus: {Blank single:19197::"yes","no"} Hearing loss: {Blank single:19197::"yes","no"} Water immersion {Blank single:19197::"yes","no"} Using Q-tips: {Blank single:19197::"yes","no"} Recurrent otitis media: {Blank single:19197::"yes","no"} Status: {Blank multiple:19196::"better","worse","stable","fluctuating"} Treatments attempted: {Blank single:19197::"none","pseudoephedrine"}  No Known Allergies  Outpatient Encounter Medications as of 10/09/2019  Medication Sig  . albuterol (VENTOLIN HFA) 108 (90 Base) MCG/ACT inhaler Inhale 2 puffs into the lungs every 6 (six) hours as needed for wheezing or shortness of breath.  Marland Kitchen aspirin 81 MG tablet Take 81 mg by mouth daily.  Marland Kitchen atorvastatin (LIPITOR) 20 MG tablet TAKE 1 TABLET(20 MG) BY MOUTH AT BEDTIME  . b complex vitamins tablet Take 1 tablet by mouth daily.  . calcipotriene (DOVONOX) 0.005 % cream Apply topically 2 (two) times daily.  . diflorasone (PSORCON) 0.05 % ointment as needed.   Marland Kitchen econazole nitrate 1 % cream as needed.   . methimazole (TAPAZOLE) 10 MG tablet Take by mouth daily.   . naproxen (NAPROSYN) 500 MG tablet Take 1  tablet (500 mg total) by mouth 2 (two) times daily as needed (foot pain).  . propylthiouracil (PTU) 50 MG tablet Take by mouth 2 (two) times daily.   . tamsulosin (FLOMAX) 0.4 MG CAPS capsule TAKE 1 CAPSULE(0.4 MG) BY MOUTH DAILY   No facility-administered encounter medications on file as of 10/09/2019.   Patient Active Problem List   Diagnosis Date Noted  . BPH (benign prostatic hyperplasia) 10/27/2018  . Multinodular goiter 12/08/2017  . Hyperthyroidism 12/08/2017  . Left thyroid nodule 05/04/2017  . Adrenal mass ([place]) 05/04/2017  . Renal mass 01/17/2017  . Liver lesion 11/29/2016  . Advanced care planning/counseling discussion 11/02/2016  . Digital mucinous cyst of finger of right hand 11/02/2016  . Benign neoplasm of sigmoid colon   . Psoriasis 09/03/2015  . Tobacco abuse   . Macrocytosis   . Hyperlipidemia 08/29/2014  . Dental disease 08/29/2014   Past Medical History:  Diagnosis Date  . Arthritis   . Heart murmur   . Hyperlipidemia   . Macrocytosis   . Thyroid disease   . Tobacco abuse    Relevant past medical, surgical, family and social history reviewed and updated as indicated. Interim medical history since our last visit reviewed.  Review of Systems  Per HPI unless specifically indicated above     Objective:    There were no vitals taken for this visit.  Wt Readings from Last 3 Encounters:  09/25/19 152 lb 12.8 oz (69.3 kg)  09/10/19 149 lb 3.2 oz (67.7 kg)  03/07/19 158 lb (71.7 kg)    Physical Exam  Results for orders placed or performed in visit on 09/10/19  CBC with Differential/Platelet  Result Value Ref Range   WBC 6.0 3.4 - 10.8 x10E3/uL   RBC 4.72 4.14 - 5.80 x10E6/uL   Hemoglobin 14.5 13.0 - 17.7 g/dL   Hematocrit 43.4 37.5 - 51.0 %   MCV 92 79 - 97 fL   MCH 30.7 26.6 - 33.0 pg   MCHC 33.4 31 - 35 g/dL   RDW 12.1 11.6 - 15.4 %   Platelets  189 150 - 450 x10E3/uL   Neutrophils 44 Not Estab. %   Lymphs 44 Not Estab. %   Monocytes 8 Not  Estab. %   Eos 3 Not Estab. %   Basos 1 Not Estab. %   Neutrophils Absolute 2.7 1 - 7 x10E3/uL   Lymphocytes Absolute 2.7 0 - 3 x10E3/uL   Monocytes Absolute 0.5 0 - 0 x10E3/uL   EOS (ABSOLUTE) 0.2 0.0 - 0.4 x10E3/uL   Basophils Absolute 0.0 0 - 0 x10E3/uL   Immature Granulocytes 0 Not Estab. %   Immature Grans (Abs) 0.0 0.0 - 0.1 x10E3/uL  Comprehensive metabolic panel  Result Value Ref Range   Glucose 96 65 - 99 mg/dL   BUN 14 8 - 27 mg/dL   Creatinine, Ser 0.87 0.76 - 1.27 mg/dL   GFR calc non Af Amer 92 >59 mL/min/1.73   GFR calc Af Amer 106 >59 mL/min/1.73   BUN/Creatinine Ratio 16 10 - 24   Sodium 138 134 - 144 mmol/L   Potassium 4.4 3.5 - 5.2 mmol/L   Chloride 104 96 - 106 mmol/L   CO2 21 20 - 29 mmol/L   Calcium 9.6 8.6 - 10.2 mg/dL   Total Protein 6.4 6.0 - 8.5 g/dL   Albumin 3.9 3.8 - 4.8 g/dL   Globulin, Total 2.5 1.5 - 4.5 g/dL   Albumin/Globulin Ratio 1.6 1.2 - 2.2   Bilirubin Total 0.5 0.0 - 1.2 mg/dL   Alkaline Phosphatase 145 (H) 48 - 121 IU/L   AST 18 0 - 40 IU/L   ALT 24 0 - 44 IU/L  Lipid Panel w/o Chol/HDL Ratio  Result Value Ref Range   Cholesterol, Total 128 100 - 199 mg/dL   Triglycerides 54 0 - 149 mg/dL   HDL 36 (L) >39 mg/dL   VLDL Cholesterol Cal 12 5 - 40 mg/dL   LDL Chol Calc (NIH) 80 0 - 99 mg/dL  PSA  Result Value Ref Range   Prostate Specific Ag, Serum 3.6 0.0 - 4.0 ng/mL  TSH  Result Value Ref Range   TSH <0.005 (L) 0.450 - 4.500 uIU/mL      Assessment & Plan:   Problem List Items Addressed This Visit    None       Follow up plan: No follow-ups on file.

## 2019-10-17 ENCOUNTER — Telehealth: Payer: Self-pay | Admitting: General Practice

## 2019-10-17 ENCOUNTER — Ambulatory Visit (INDEPENDENT_AMBULATORY_CARE_PROVIDER_SITE_OTHER): Payer: Medicare Other | Admitting: General Practice

## 2019-10-17 ENCOUNTER — Encounter: Payer: Self-pay | Admitting: General Practice

## 2019-10-17 DIAGNOSIS — Z720 Tobacco use: Secondary | ICD-10-CM

## 2019-10-17 DIAGNOSIS — Z5900 Homelessness unspecified: Secondary | ICD-10-CM

## 2019-10-17 DIAGNOSIS — E7800 Pure hypercholesterolemia, unspecified: Secondary | ICD-10-CM

## 2019-10-17 DIAGNOSIS — H6123 Impacted cerumen, bilateral: Secondary | ICD-10-CM

## 2019-10-17 DIAGNOSIS — E0590 Thyrotoxicosis, unspecified without thyrotoxic crisis or storm: Secondary | ICD-10-CM

## 2019-10-17 NOTE — Chronic Care Management (AMB) (Signed)
Chronic Care Management   Follow Up Note   10/17/2019 Name: Tyler Cohen MRN: [id number] DOB: 04/17/1955  Referred by: Valerie Roys, DO Reason for referral : Chronic Care Management (RNCM Follow up Call: Chronic Disease Management and Care Coordination Needs)   Tyler Cohen is a 64 y.o. year old male who is a primary care patient of Valerie Roys, DO. The CCM team was consulted for assistance with chronic disease management and care coordination needs.    Review of patient status, including review of consultants reports, relevant laboratory and other test results, and collaboration with appropriate care team members and the patient's provider was performed as part of comprehensive patient evaluation and provision of chronic care management services.    SDOH (Social Determinants of Health) assessments performed: Yes See Care Plan activities for detailed interventions related to SDOH)  SDOH Interventions     Most Recent Value  SDOH Interventions  Housing Interventions Other (Comment)  [careguide referral for housing in [place]. Staying with a cousin currently- needs housing for him and his wife]  Physical Activity Interventions Other (Comments)  [the patient is increasing his activity level. The patient is riding his bike 2 to 4 times a week for over an hour]       Outpatient Encounter Medications as of 10/17/2019  Medication Sig  . albuterol (VENTOLIN HFA) 108 (90 Base) MCG/ACT inhaler Inhale 2 puffs into the lungs every 6 (six) hours as needed for wheezing or shortness of breath.  Marland Kitchen aspirin 81 MG tablet Take 81 mg by mouth daily.  Marland Kitchen atorvastatin (LIPITOR) 20 MG tablet TAKE 1 TABLET(20 MG) BY MOUTH AT BEDTIME  . b complex vitamins tablet Take 1 tablet by mouth daily.  . calcipotriene (DOVONOX) 0.005 % cream Apply topically 2 (two) times daily.  . diflorasone (PSORCON) 0.05 % ointment as needed.   Marland Kitchen econazole nitrate 1 % cream as needed.   . methimazole (TAPAZOLE)  10 MG tablet Take by mouth daily.   . naproxen (NAPROSYN) 500 MG tablet Take 1 tablet (500 mg total) by mouth 2 (two) times daily as needed (foot pain).  . propylthiouracil (PTU) 50 MG tablet Take by mouth 2 (two) times daily.   . tamsulosin (FLOMAX) 0.4 MG CAPS capsule TAKE 1 CAPSULE(0.4 MG) BY MOUTH DAILY   No facility-administered encounter medications on file as of 10/17/2019.     Objective:  BP Readings from Last 3 Encounters:  09/25/19 121/67  09/10/19 114/73  02/01/19 114/69    Goals Addressed              This Visit's Progress   .  RNCM-pt"I try to eat healthy" (pt-stated)        CARE PLAN ENTRY (see longtitudinal plan of care for additional care plan information)  Current Barriers:  . Chronic Disease Management support, education, and care coordination needs related to HLD and Hyperthyroidism   Clinical Goal(s) related to HLD and Hyperthyroidism  :  Over the next 120 days, patient will:  . Work with the care management team to address educational, disease management, and care coordination needs  . Begin or continue self health monitoring activities as directed today  adhere to a heart healthy diet . Call provider office for new or worsened signs and symptoms Oxygen saturation lower than established parameter, Shortness of breath, and New or worsened symptom related to HLD, Hyperthyroidism and other chronic conditions  . Call care management team with questions or concerns . Verbalize basic understanding  of patient centered plan of care established today  Interventions related to HLD and Hyperthyroidism :  . Evaluation of current treatment plans and patient's adherence to plan as established by provider.  10-17-2019: the patient with a face to face visit in the office. The patient states he is doing well. He was having problems with wax build up in his ears but this is much better now that he is following the recommendations of the provider.  . Assessed patient  understanding of disease states. 10-17-2019: Review of chronic conditions. The patient states he is doing well taking care of himself. He is watching what he eats, trying to stop smoking, looking for stable housing for him and his wife, and exercising more by riding his bike at night when it is cooler for an hour or more. Praised the patient for positive change. The patient states he feels so much better when he is out riding his bike and can tell a big difference.  . Assessed patient's education and care coordination needs.  Care guide referral for help with housing needs. The patient is currently living with a cousin and his wife is in a facility. He has a new phone number: [phone].  Information provided for the CCM team and for the care guides reaching out to him to help with housing needs.  . Provided disease specific education to patient. Review of heart healthy diet. The patient verbalized he tries to eat healthy. 10-17-2019: The patient endorses following a heart healthy diet. He has cut back on salt and fats in his diet.  . Evaluation of exercise regimen. The patient does not do any structured exercise but states he does mow yards. 10-17-2019: The patient is riding his bike 2 to 4 times a week for over an hour. This is helping him feel better and he plans on increasing his riding. He does this when it is cooler at night.  Nash Dimmer with appropriate clinical care team members regarding patient needs.  Denies any needs from the LCSW and pharmacist. Will update the team that he has a new number. Also will let them know about care guide referral for housing. . Evaluation of next appointment. The patient sees pcp 01-14-2020. [facility] with [facility] 12-18-2019 at 11 am.  Has updated care team information to call if needed sooner.  . Evaluation of smoking cessation. The patient smokes 4 packs of cigars a day. There are 2 in a pack. He has cut back considerably and will call for help with stopping.  Praised patient for accomplishments and will continue to monitor for needs.   Patient Self Care Activities related to HLD and Hyperthyroidism :  . Patient is unable to independently self-manage chronic health conditions  Please see past updates related to this goal by clicking on the "Past Updates" button in the selected goal          Plan:   Telephone follow up appointment with care management team member scheduled for:12-18-2019 at 63 am   [place], MSN, [facility] Mobile: [phone]

## 2019-10-17 NOTE — Patient Instructions (Signed)
Visit Information  Goals Addressed              This Visit's Progress   .  RNCM-pt"I try to eat healthy" (pt-stated)        CARE PLAN ENTRY (see longtitudinal plan of care for additional care plan information)  Current Barriers:  . Chronic Disease Management support, education, and care coordination needs related to HLD and Hyperthyroidism   Clinical Goal(s) related to HLD and Hyperthyroidism  :  Over the next 120 days, patient will:  . Work with the care management team to address educational, disease management, and care coordination needs  . Begin or continue self health monitoring activities as directed today  adhere to a heart healthy diet . Call provider office for new or worsened signs and symptoms Oxygen saturation lower than established parameter, Shortness of breath, and New or worsened symptom related to HLD, Hyperthyroidism and other chronic conditions  . Call care management team with questions or concerns . Verbalize basic understanding of patient centered plan of care established today  Interventions related to HLD and Hyperthyroidism :  . Evaluation of current treatment plans and patient's adherence to plan as established by provider.  10-17-2019: the patient with a face to face visit in the office. The patient states he is doing well. He was having problems with wax build up in his ears but this is much better now that he is following the recommendations of the provider.  . Assessed patient understanding of disease states. 10-17-2019: Review of chronic conditions. The patient states he is doing well taking care of himself. He is watching what he eats, trying to stop smoking, looking for stable housing for him and his wife, and exercising more by riding his bike at night when it is cooler for an hour or more. Praised the patient for positive change. The patient states he feels so much better when he is out riding his bike and can tell a big difference.  . Assessed patient's  education and care coordination needs.  Care guide referral for help with housing needs. The patient is currently living with a cousin and his wife is in a facility. He has a new phone number: [phone].  Information provided for the CCM team and for the care guides reaching out to him to help with housing needs.  . Provided disease specific education to patient. Review of heart healthy diet. The patient verbalized he tries to eat healthy. 10-17-2019: The patient endorses following a heart healthy diet. He has cut back on salt and fats in his diet.  . Evaluation of exercise regimen. The patient does not do any structured exercise but states he does mow yards. 10-17-2019: The patient is riding his bike 2 to 4 times a week for over an hour. This is helping him feel better and he plans on increasing his riding. He does this when it is cooler at night.  Nash Dimmer with appropriate clinical care team members regarding patient needs.  Denies any needs from the LCSW and pharmacist. Will update the team that he has a new number. Also will let them know about care guide referral for housing. . Evaluation of next appointment. The patient sees pcp 01-14-2020. Next appointment with [facility] 12-18-2019 at 11 am.  Has updated care team information to call if needed sooner.  . Evaluation of smoking cessation. The patient smokes 4 packs of cigars a day. There are 2 in a pack. He has cut back considerably and will call  for help with stopping. Praised patient for accomplishments and will continue to monitor for needs.   Patient Self Care Activities related to HLD and Hyperthyroidism :  . Patient is unable to independently self-manage chronic health conditions  Please see past updates related to this goal by clicking on the "Past Updates" button in the selected goal         Patient verbalizes understanding of instructions provided today.   Telephone follow up appointment with care management team member scheduled  for:12-18-2019 at 55 am  [place], MSN, [facility] Mobile: [phone]

## 2019-11-20 ENCOUNTER — Other Ambulatory Visit: Payer: Self-pay | Admitting: Family Medicine

## 2019-11-20 MED ORDER — NAPROXEN 500 MG PO TABS: 500.0000 mg | ORAL_TABLET | Freq: Two times a day (BID) | ORAL | 2 refills | Status: DC | PRN

## 2019-11-20 MED ORDER — ATORVASTATIN CALCIUM 20 MG PO TABS: ORAL_TABLET | ORAL | 2 refills | Status: DC

## 2019-11-20 MED ORDER — ALBUTEROL SULFATE HFA 108 (90 BASE) MCG/ACT IN AERS: 2.0000 | INHALATION_SPRAY | Freq: Four times a day (QID) | RESPIRATORY_TRACT | 6 refills | Status: DC | PRN

## 2019-11-20 MED ORDER — TAMSULOSIN HCL 0.4 MG PO CAPS: ORAL_CAPSULE | ORAL | 2 refills | Status: DC

## 2019-11-20 NOTE — Telephone Encounter (Signed)
Patient would like a new prescription sent for Aspirin that is listed on medication list, unable to reorder.

## 2019-11-20 NOTE — Telephone Encounter (Signed)
albuterol (VENTOLIN HFA) 108 (90 Base) MCG/ACT inhaler 18 g 6 09/10/2019    Sig - Route: Inhale 2 puffs into the lungs every 6 (six) hours as needed for wheezing or shortness of breath. - Inhalation   Sent to pharmacy as: albuterol (VENTOLIN HFA) 108 (90 Base) MCG/ACT inhaler   E-Prescribing Status: Receipt confirmed by pharmacy (09/10/2019 11:22 AM EDT)    aspirin 81 MG tablet Medication Date: 08/29/2014 Department: [facility] Ordering: Anselm Pancoast, CMA Authorizing: [provider]  Order Providers  atorvastatin (LIPITOR) 20 MG tablet Medication Date: 09/10/2019 Department: [facility] Ordering/Authorizing: Valerie Roys, DO   calcipotriene (DOVONOX) 0.005 %   econazole nitrate 1 % cream Medication Date: 08/22/2018 Department: [facility] Ordering: Lesle Chris, [place]: [provider]   diflorasone (PSORCON) 0.05 % ointment Medication Date: 08/22/2018 Department: [facility] Ordering: Lesle Chris, CMA Authorizing: [provider]  naproxen (NAPROSYN) 500 MG tablet Medication Date: 09/10/2019 Department: [facility] Ordering/Authorizing: Valerie Roys, DO  Order Providers  propylthiouracil (PTU) 50 MG tablet Medication Date: 12/26/2018 Department: [place] Ordering: Lesle Chris, [place]: [provider]   tamsulosin (FLOMAX) 0.4 MG CAPS capsule Medication Date: 09/10/2019 Department: [facility] Ordering/Authorizing: Valerie Roys, DO   [place], [place] Phone:  [phone]  Fax:  [phone]    Danae Chen from [facility] has called and states pt is chging to [facility] due to cost. She has given a list that the pt states they are taking. Pharmacy was told that some of these meds are listed as historical and possibility of review from dr all of them may not be sent in.

## 2019-11-20 NOTE — Telephone Encounter (Signed)
Requested medication (s) are due for refill today: Yes  Requested medication (s) are on the active medication list: Yes  Last refill: 1 year ago  Future visit scheduled: Yes  Notes to clinic:  Unable to refill per protocol, expired Rx, last refill by another provider  Patient would like a Rx sent for aspirin on profile, unable to attach to this encounter.     Requested Prescriptions  Pending Prescriptions Disp Refills   calcipotriene (DOVONOX) 0.005 % cream 60 g     Sig: Apply topically 2 (two) times daily.      Dermatology:  Other Passed - 11/20/2019  6:27 PM      Passed - Valid encounter within last 12 months    Recent Outpatient Visits           1 month ago Bilateral impacted cerumen   [place], Megan P, DO   2 months ago Hyperthyroidism   [place], Megan P, DO   10 months ago Encounter for [employer] annual wellness exam   Rozel, Daisetta, DO   1 year ago Hyperthyroidism   Bonnie, [place], DO   1 year ago Near syncope   [employer], Barb Merino, DO       Future Appointments             In 1 month Johnson, Barb Merino, DO [employer]   In 2 months Stoioff, Ronda Fairly, MD [facility]              econazole nitrate 1 % cream 15 g     Sig: as needed.      Off-Protocol Failed - 11/20/2019  6:27 PM      Failed - Medication not assigned to a protocol, review manually.      Passed - Valid encounter within last 12 months    Recent Outpatient Visits           1 month ago Bilateral impacted cerumen   [place], [place], DO   2 months ago Hyperthyroidism   [place], Megan P, DO   10 months ago Encounter for [employer] annual wellness exam   [place], [place], DO   1 year ago Hyperthyroidism   [place], [place], DO   1 year  ago Near syncope   [place], DO       Future Appointments             In 1 month Johnson, Barb Merino, DO [employer]   In 2 months Stoioff, Ronda Fairly, MD [facility]              methimazole (TAPAZOLE) 10 MG tablet      Sig: Take by mouth daily.      Not Delegated - Endocrinology:  Hyperthyroid Agents Failed - 11/20/2019  6:27 PM      Failed - This refill cannot be delegated      Failed - TSH in normal range and within 180 days    TSH  Date Value Ref Range Status  09/10/2019 <0.005 (L) 0.450 - 4.500 uIU/mL Final          Passed - Valid encounter within last 6 months    Recent Outpatient Visits           1 month ago Bilateral  impacted cerumen   [facility] [place], [place], DO   2 months ago Hyperthyroidism   [place], Megan P, DO   10 months ago Encounter for [employer] annual wellness exam   [place], [place], DO   1 year ago Hyperthyroidism   [place], [place], DO   1 year ago Near syncope   [place], Barb Merino, DO       Future Appointments             In 1 month Johnson, Barb Merino, DO [employer]   In 2 months Stoioff, Ronda Fairly, MD [facility]              propylthiouracil (PTU) 50 MG tablet      Sig: Take by mouth 2 (two) times daily.      Not Delegated - Endocrinology:  Hyperthyroid Agents Failed - 11/20/2019  6:27 PM      Failed - This refill cannot be delegated      Failed - TSH in normal range and within 180 days    TSH  Date Value Ref Range Status  09/10/2019 <0.005 (L) 0.450 - 4.500 uIU/mL Final          Passed - Valid encounter within last 6 months    Recent Outpatient Visits           1 month ago Bilateral impacted cerumen   [place], Megan P, DO   2 months ago Hyperthyroidism   [place], Megan P, DO   10 months ago Encounter for [employer] annual wellness exam   [place], [place], DO   1 year ago Hyperthyroidism   [facility] [place], [place], DO   1 year ago Near syncope   [employer], [place], DO       Future Appointments             In 1 month Johnson, Megan P, DO [employer]   In 2 months Stoioff, Ronda Fairly, MD [facility]             Signed Prescriptions Disp Refills   albuterol (VENTOLIN HFA) 108 (90 Base) MCG/ACT inhaler 18 g 6    Sig: Inhale 2 puffs into the lungs every 6 (six) hours as needed for wheezing or shortness of breath.      Pulmonology:  Beta Agonists Failed - 11/20/2019  6:27 PM      Failed - One inhaler should last at least one month. If the patient is requesting refills earlier, contact the patient to check for uncontrolled symptoms.      Passed - Valid encounter within last 12 months    Recent Outpatient Visits           1 month ago Bilateral impacted cerumen   [place]y, [place], DO   2 months ago Hyperthyroidism   [place], Megan P, DO   10 months ago Encounter for [employer] annual wellness exam   [place], [place], DO   1 year ago Hyperthyroidism   [place], [place], DO   1 year ago Near syncope   [place], DO       Future Appointments             In  1 month Johnson, Barb Merino, DO [employer]   In 2 months Stoioff, Ronda Fairly, MD [facility]              atorvastatin (LIPITOR) 20 MG tablet 90 tablet 2    Sig: TAKE 1 TABLET(20 MG) BY MOUTH AT BEDTIME      Cardiovascular:  Antilipid - Statins Failed - 11/20/2019  6:27 PM      Failed - LDL in normal range and within 360 days    LDL Chol Calc (NIH)  Date Value Ref Range Status  09/10/2019 80 0 - 99  mg/dL Final          Failed - HDL in normal range and within 360 days    HDL  Date Value Ref Range Status  09/10/2019 36 (L) >39 mg/dL Final          Passed - Total Cholesterol in normal range and within 360 days    Cholesterol, Total  Date Value Ref Range Status  09/10/2019 128 100 - 199 mg/dL Final          Passed - Triglycerides in normal range and within 360 days    Triglycerides  Date Value Ref Range Status  09/10/2019 54 0 - 149 mg/dL Final          Passed - Patient is not pregnant      Passed - Valid encounter within last 12 months    Recent Outpatient Visits           1 month ago Bilateral impacted cerumen   [place], Megan P, DO   2 months ago Hyperthyroidism   [facility] [place], Megan P, DO   10 months ago Encounter for [employer] annual wellness exam   [place]in, [place], DO   1 year ago Hyperthyroidism   [facility] [place], [place], DO   1 year ago Near syncope   [employer], [place], DO       Future Appointments             In 1 month Johnson, Megan P, DO [employer]   In 2 months Stoioff, Ronda Fairly, MD [facility]              naproxen (NAPROSYN) 500 MG tablet 180 tablet 2    Sig: Take 1 tablet (500 mg total) by mouth 2 (two) times daily as needed (foot pain).      Analgesics:  NSAIDS Passed - 11/20/2019  6:27 PM      Passed - Cr in normal range and within 360 days    Creatinine, Ser  Date Value Ref Range Status  09/10/2019 0.87 0.76 - 1.27 mg/dL Final          Passed - HGB in normal range and within 360 days    Hemoglobin  Date Value Ref Range Status  09/10/2019 14.5 13.0 - 17.7 g/dL Final          Passed - Patient is not pregnant      Passed - Valid encounter within last 12 months    Recent Outpatient Visits           1 month ago Bilateral impacted cerumen   [place],  [place], DO   2 months ago Hyperthyroidism   [place], Megan P, DO   10 months ago Encounter for [employer] annual wellness exam  [place], Newborn, DO   1 year ago Hyperthyroidism   [place], [place] [employer], DO   1 year ago Near syncope   [place], [place], DO       Future Appointments             In 1 month Johnson, Barb Merino, DO [employer] [place]   In 2 months Stoioff, Ronda Fairly, MD [facility]              tamsulosin (FLOMAX) 0.4 MG CAPS capsule 90 capsule 2    Sig: TAKE 1 CAPSULE(0.4 MG) BY MOUTH DAILY      Urology: Alpha-Adrenergic Blocker Passed - 11/20/2019  6:27 PM      Passed - Last BP in normal range    BP Readings from Last 1 Encounters:  09/25/19 121/67          Passed - Valid encounter within last 12 months    Recent Outpatient Visits           1 month ago Bilateral impacted cerumen   [place], Megan P, DO   2 months ago Hyperthyroidism   [place], Megan P, DO   10 months ago 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

## 2019-11-20 NOTE — Addendum Note (Signed)
Addended by: Matilde Sprang on: 11/20/2019 06:27 PM   Modules accepted: Orders

## 2019-11-21 NOTE — Telephone Encounter (Signed)
Patient last seen 09/10/19 and has appointment 01/14/20

## 2019-11-27 ENCOUNTER — Other Ambulatory Visit: Payer: Self-pay

## 2019-11-27 MED ORDER — TAMSULOSIN HCL 0.4 MG PO CAPS: ORAL_CAPSULE | ORAL | 1 refills | Status: DC

## 2019-11-27 MED ORDER — ATORVASTATIN CALCIUM 20 MG PO TABS: ORAL_TABLET | ORAL | 1 refills | Status: DC

## 2019-11-27 MED ORDER — NAPROXEN 500 MG PO TABS: 500.0000 mg | ORAL_TABLET | Freq: Two times a day (BID) | ORAL | 1 refills | Status: DC | PRN

## 2019-11-27 NOTE — Telephone Encounter (Signed)
Fax from [facility] requesting new Rx for all of pts medications.

## 2019-12-06 ENCOUNTER — Telehealth: Payer: Self-pay | Admitting: Family Medicine

## 2019-12-06 ENCOUNTER — Encounter: Payer: Self-pay | Admitting: Family Medicine

## 2019-12-06 NOTE — Telephone Encounter (Signed)
° °  SF 12/06/2019    Name: GEDEON BRANDOW    MRN: [id number]    DOB: 05-20-1955    AGE: 63 y.o.    GENDER: male    PCP [place] Liter P, DO.   Spoke with Mr. Mustin today regarding referral. Patient stated that he is still currently looking for a place to stay because his wife is in a skilled nursing facility and she is ready to leave. Asked patient if she was able to contact the housing resources that were given to him back in April 2021, but he stated that he has not contacted anyone. Also asked patient if he has applied with [employer] or [employer] and patient stated that he has not. Patient asked if housing resources could be mailed to him again and that he will apply to [employer]. No additional needs at this time. Insurance account manager of income-based housing at [employer]. Front desk will mail letter to patient.   Closing referral pending any other needs of patient.    [place], Care Management Phone: [phone] Email: [email or web address]

## 2019-12-18 ENCOUNTER — Telehealth: Payer: Self-pay | Admitting: General Practice

## 2019-12-18 ENCOUNTER — Telehealth: Payer: Medicare PPO | Admitting: General Practice

## 2019-12-18 ENCOUNTER — Ambulatory Visit (INDEPENDENT_AMBULATORY_CARE_PROVIDER_SITE_OTHER): Payer: Medicare PPO | Admitting: General Practice

## 2019-12-18 DIAGNOSIS — E0590 Thyrotoxicosis, unspecified without thyrotoxic crisis or storm: Secondary | ICD-10-CM

## 2019-12-18 DIAGNOSIS — E7800 Pure hypercholesterolemia, unspecified: Secondary | ICD-10-CM

## 2019-12-18 NOTE — Patient Instructions (Signed)
Visit Information  Goals Addressed              This Visit's Progress   .  RNCM-pt"I try to eat healthy" (pt-stated)        CARE PLAN ENTRY (see longtitudinal plan of care for additional care plan information)  Current Barriers:  . Chronic Disease Management support, education, and care coordination needs related to HLD and Hyperthyroidism   Clinical Goal(s) related to HLD and Hyperthyroidism  :  Over the next 120 days, patient will:  . Work with the care management team to address educational, disease management, and care coordination needs  . Begin or continue self health monitoring activities as directed today  adhere to a heart healthy diet . Call provider office for new or worsened signs and symptoms Oxygen saturation lower than established parameter, Shortness of breath, and New or worsened symptom related to HLD, Hyperthyroidism and other chronic conditions  . Call care management team with questions or concerns . Verbalize basic understanding of patient centered plan of care established today  Interventions related to HLD and Hyperthyroidism :  . Evaluation of current treatment plans and patient's adherence to plan as established by provider.  12-18-2019: The patient states he is doing well health wise. He denies any further issues with his ears. He is eating well and taking his medications. Biggest concern is housing for him.  . Assessed patient understanding of disease states. 12-18-2019: Review of chronic conditions. The patient states he is doing well taking care of himself. He is watching what he eats, trying to stop smoking, still looking for stable housing for him and his wife, and exercising more by riding his bike at night when it is cooler for an hour or more. Praised the patient for positive change. The patient states he feels so much better when he is out riding his bike and can tell a big difference.  . Assessed patient's education and care coordination needs.  Care  guide referral for help with housing needs. The patient is currently living with a cousin and his wife is in a facility. He has a new phone number: [phone].  Information provided for the CCM team and for the care guides reaching out to him to help with housing needs. 12-18-2019: The patient has received the information on housing and has called the numbers. He is waiting for call backs. The patient says his wife is ready to come home but she can not until they find a place to live. The patient has been working with the care guides.  . Provided disease specific education to patient. Review of heart healthy diet. The patient verbalized he tries to eat healthy. 12-18-2019:  The patient endorses following a heart healthy diet. He has cut back on salt and fats in his diet.  . Evaluation of exercise regimen. The patient does not do any structured exercise but states he does mow yards. 12-18-2019: The patient is riding his bike 2 to 4 times a week for over an hour. This is helping him feel better and he plans on increasing his riding. He does this when it is cooler at night.  Nash Dimmer with appropriate clinical care team members regarding patient needs.  Denies any needs from the LCSW and pharmacist. Will update the team that he has a new number. Also will let them know about care guide referral for housing. . Evaluation of next appointment. The patient sees pcp 01-14-2020. Next appointment with [facility] 03-04-2020 at 11:30 am.  Has updated care team information to call if needed sooner.  . Evaluation of smoking cessation. The patient smokes 4 packs of cigars a day. There are 2 in a pack. He has cut back considerably and will call for help with stopping. Praised patient for accomplishments and will continue to monitor for needs.   Patient Self Care Activities related to HLD and Hyperthyroidism :  . Patient is unable to independently self-manage chronic health conditions  Please see past updates related to this  goal by clicking on the "Past Updates" button in the selected goal         Patient verbalizes understanding of instructions provided today.   Telephone follow up appointment with care management team member scheduled for: 03-04-2020 at 11:30 am  [place], MSN, [facility] Mobile: [phone]

## 2019-12-18 NOTE — Chronic Care Management (AMB) (Signed)
Chronic Care Management   Follow Up Note   12/18/2019 Name: Tyler Cohen MRN: [id number] DOB: 07-05-1955  Referred by: Valerie Roys, DO Reason for referral : Chronic Care Management (RNCM Follow up call for Chronic Disease Management and Care Coordination Needs)   Tyler Cohen is a 64 y.o. year old male who is a primary care patient of Valerie Roys, DO. The CCM team was consulted for assistance with chronic disease management and care coordination needs.    Review of patient status, including review of consultants reports, relevant laboratory and other test results, and collaboration with appropriate care team members and the patient's provider was performed as part of comprehensive patient evaluation and provision of chronic care management services.    SDOH (Social Determinants of Health) assessments performed: Yes See Care Plan activities for detailed interventions related to [facility])     Outpatient Encounter Medications as of 12/18/2019  Medication Sig  . albuterol (VENTOLIN HFA) 108 (90 Base) MCG/ACT inhaler Inhale 2 puffs into the lungs every 6 (six) hours as needed for wheezing or shortness of breath.  Marland Kitchen aspirin 81 MG tablet Take 81 mg by mouth daily.  Marland Kitchen atorvastatin (LIPITOR) 20 MG tablet TAKE 1 TABLET(20 MG) BY MOUTH AT BEDTIME  . b complex vitamins tablet Take 1 tablet by mouth daily.  . calcipotriene (DOVONOX) 0.005 % cream Apply topically 2 (two) times daily.  . diflorasone (PSORCON) 0.05 % ointment as needed.   Marland Kitchen econazole nitrate 1 % cream as needed.   . methimazole (TAPAZOLE) 10 MG tablet Take by mouth daily.   . naproxen (NAPROSYN) 500 MG tablet Take 1 tablet (500 mg total) by mouth 2 (two) times daily as needed (foot pain).  . propylthiouracil (PTU) 50 MG tablet Take by mouth 2 (two) times daily.   . tamsulosin (FLOMAX) 0.4 MG CAPS capsule TAKE 1 CAPSULE(0.4 MG) BY MOUTH DAILY   No facility-administered encounter medications on file as of 12/18/2019.      Objective:  BP Readings from Last 3 Encounters:  09/25/19 121/67  09/10/19 114/73  02/01/19 114/69    Goals Addressed              This Visit's Progress   .  RNCM-pt"I try to eat healthy" (pt-stated)        CARE PLAN ENTRY (see longtitudinal plan of care for additional care plan information)  Current Barriers:  . Chronic Disease Management support, education, and care coordination needs related to HLD and Hyperthyroidism   Clinical Goal(s) related to HLD and Hyperthyroidism  :  Over the next 120 days, patient will:  . Work with the care management team to address educational, disease management, and care coordination needs  . Begin or continue self health monitoring activities as directed today  adhere to a heart healthy diet . Call provider office for new or worsened signs and symptoms Oxygen saturation lower than established parameter, Shortness of breath, and New or worsened symptom related to HLD, Hyperthyroidism and other chronic conditions  . Call care management team with questions or concerns . Verbalize basic understanding of patient centered plan of care established today  Interventions related to HLD and Hyperthyroidism :  . Evaluation of current treatment plans and patient's adherence to plan as established by provider.  12-18-2019: The patient states he is doing well health wise. He denies any further issues with his ears. He is eating well and taking his medications. Biggest concern is housing for him.  . Assessed patient  understanding of disease states. 12-18-2019: Review of chronic conditions. The patient states he is doing well taking care of himself. He is watching what he eats, trying to stop smoking, still looking for stable housing for him and his wife, and exercising more by riding his bike at night when it is cooler for an hour or more. Praised the patient for positive change. The patient states he feels so much better when he is out riding his bike and can  tell a big difference.  . Assessed patient's education and care coordination needs.  Care guide referral for help with housing needs. The patient is currently living with a cousin and his wife is in a facility. He has a new phone number: [phone].  Information provided for the CCM team and for the care guides reaching out to him to help with housing needs. 12-18-2019: The patient has received the information on housing and has called the numbers. He is waiting for call backs. The patient says his wife is ready to come home but she can not until they find a place to live. The patient has been working with the care guides.  . Provided disease specific education to patient. Review of heart healthy diet. The patient verbalized he tries to eat healthy. 12-18-2019:  The patient endorses following a heart healthy diet. He has cut back on salt and fats in his diet.  . Evaluation of exercise regimen. The patient does not do any structured exercise but states he does mow yards. 12-18-2019: The patient is riding his bike 2 to 4 times a week for over an hour. This is helping him feel better and he plans on increasing his riding. He does this when it is cooler at night.  Nash Dimmer with appropriate clinical care team members regarding patient needs.  Denies any needs from the LCSW and pharmacist. Will update the team that he has a new number. Also will let them know about care guide referral for housing. . Evaluation of next appointment. The patient sees pcp 01-14-2020. Next appointment with [facility] 03-04-2020 at 11:30 am.  Has updated care team information to call if needed sooner.  . Evaluation of smoking cessation. The patient smokes 4 packs of cigars a day. There are 2 in a pack. He has cut back considerably and will call for help with stopping. Praised patient for accomplishments and will continue to monitor for needs.   Patient Self Care Activities related to HLD and Hyperthyroidism :  . Patient is unable to  independently self-manage chronic health conditions  Please see past updates related to this goal by clicking on the "Past Updates" button in the selected goal          Plan:   Telephone follow up appointment with care management team member scheduled for: 03-04-2020 at 11:30 am   [place], MSN, [facility] Mobile: [phone]

## 2019-12-18 NOTE — Telephone Encounter (Signed)
°  Chronic Care Management   Note  12/18/2019 Name: ELDRA WORD MRN: [id number] DOB: 11-May-1955  The patient called back and outreach follow up completed. See new encounter.   Follow up plan: Telephone follow up appointment with care management team member scheduled for: 03-04-2020 at 11:30  [place], MSN, [facility] Mobile: [phone]

## 2019-12-21 IMAGING — US US RENAL
1 series · 13 of 25 positions shown · non-contrast
Comparison: CT abdomen and pelvis 02/16/2018

CLINICAL DATA: LEFT renal mass

EXAM:
RENAL / URINARY TRACT ULTRASOUND COMPLETE

[Series 1: us renal · 0.20mm/px · 13 of 64 slices shown]
[im 1/64]
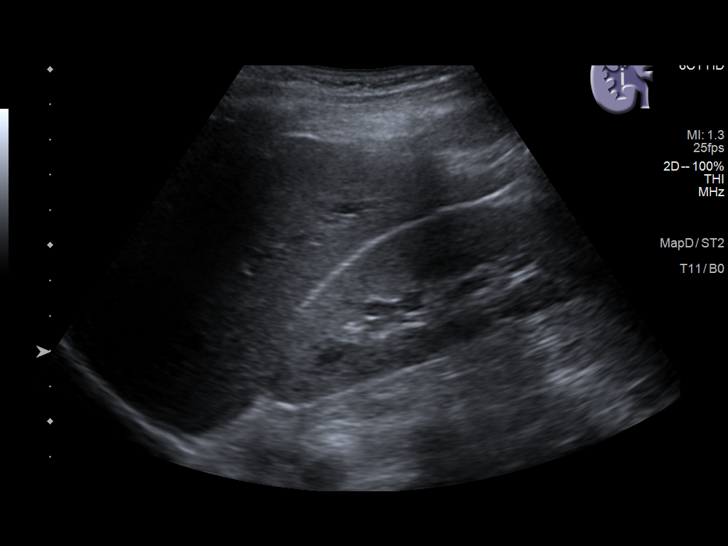
[im 6/64]
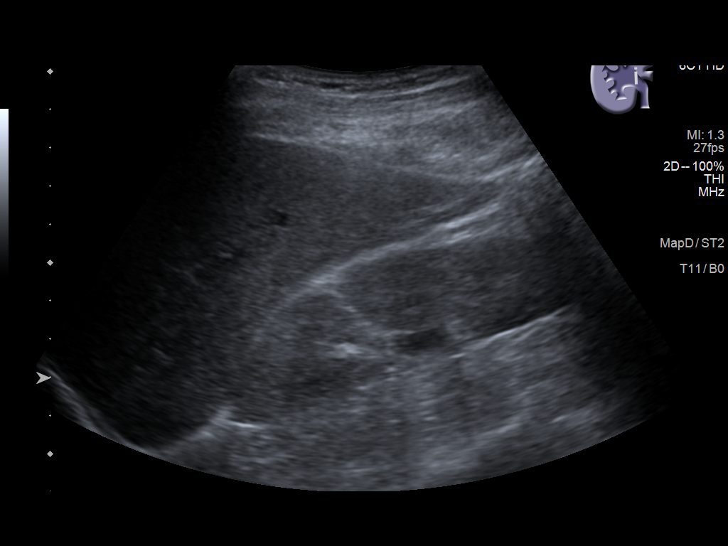
[im 11/64]
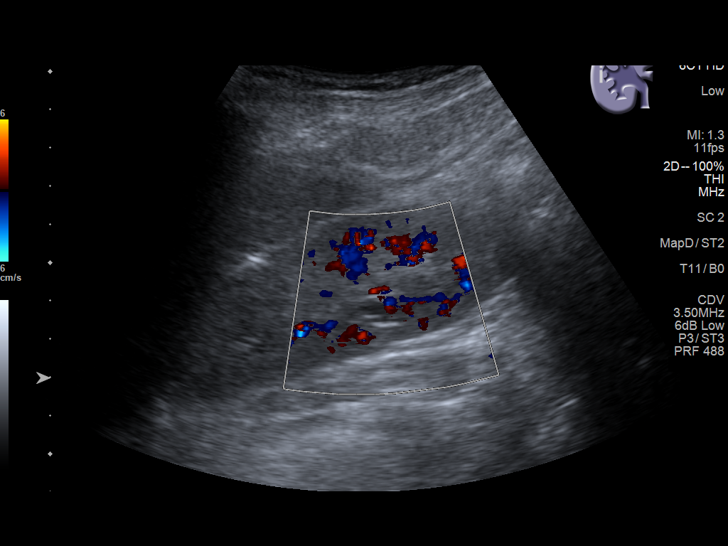
[im 16/64]
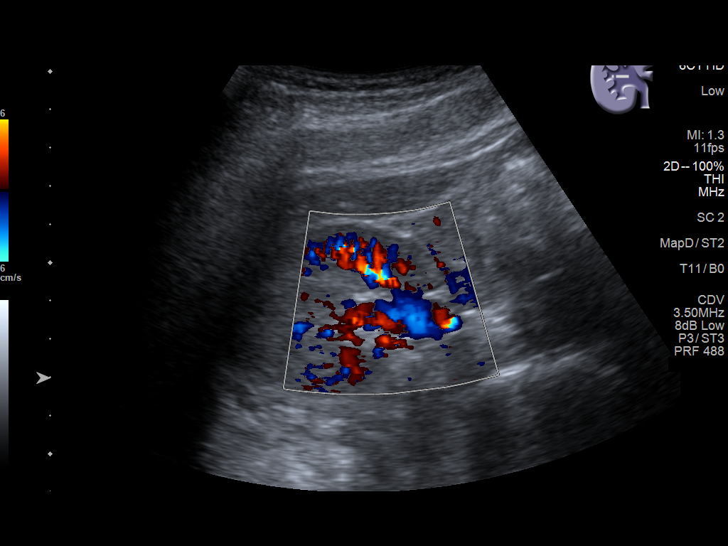
[im 22/64]
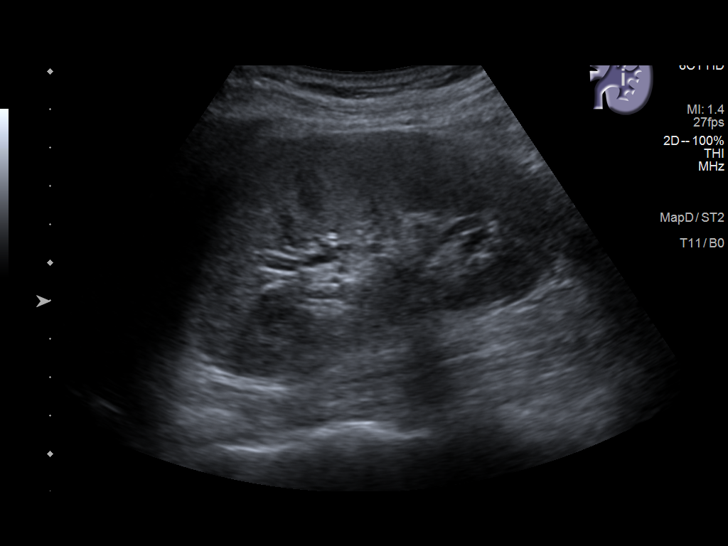
[im 27/64]
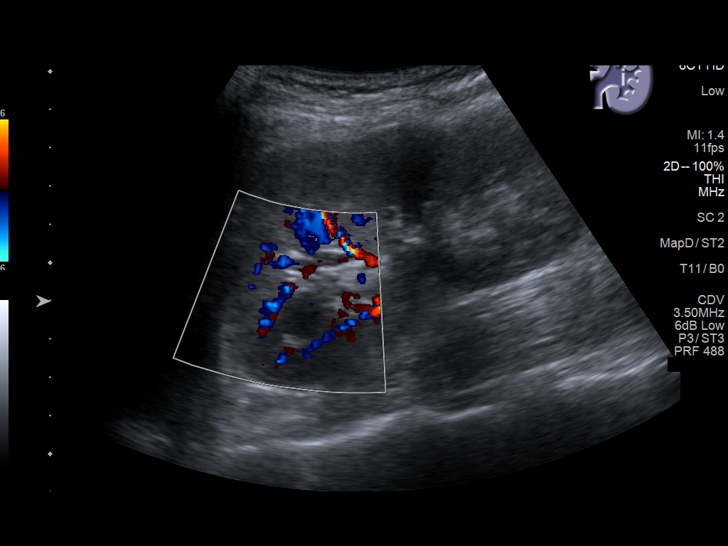
[im 32/64]
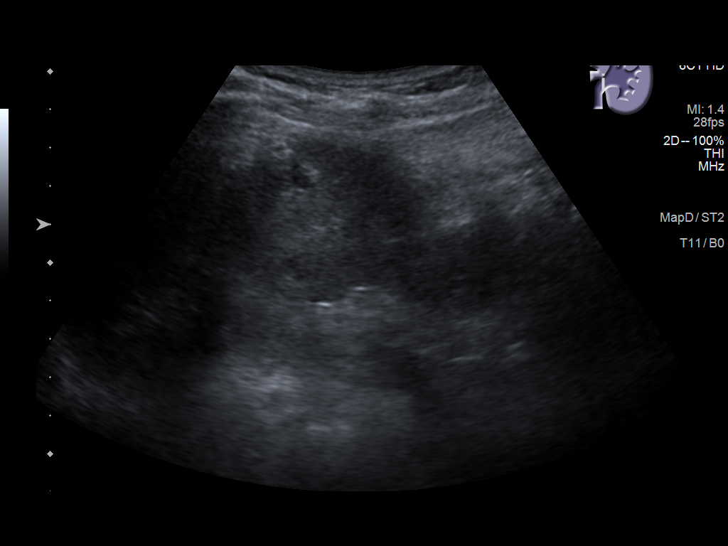
[im 37/64]
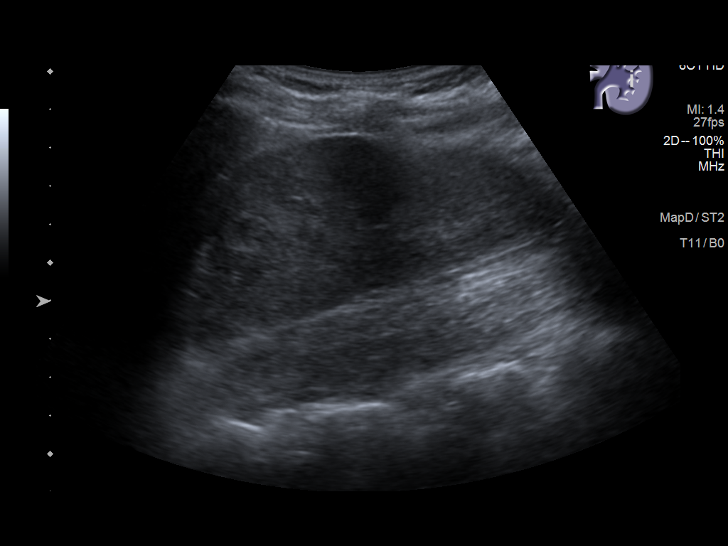
[im 43/64]
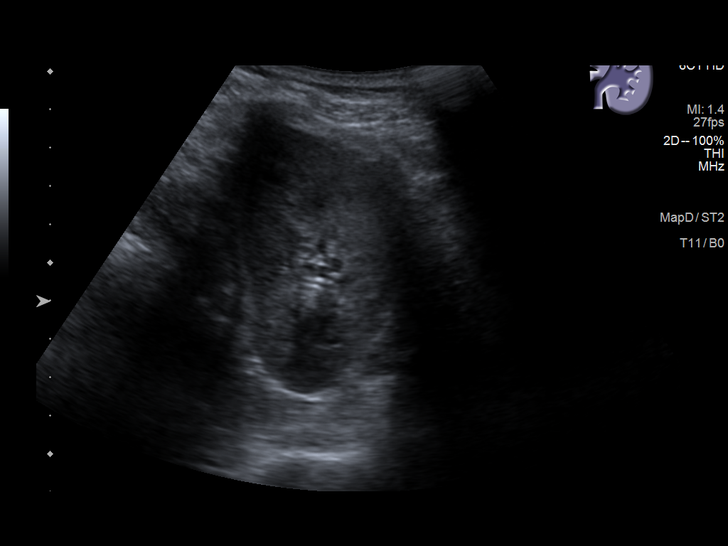
[im 48/64]
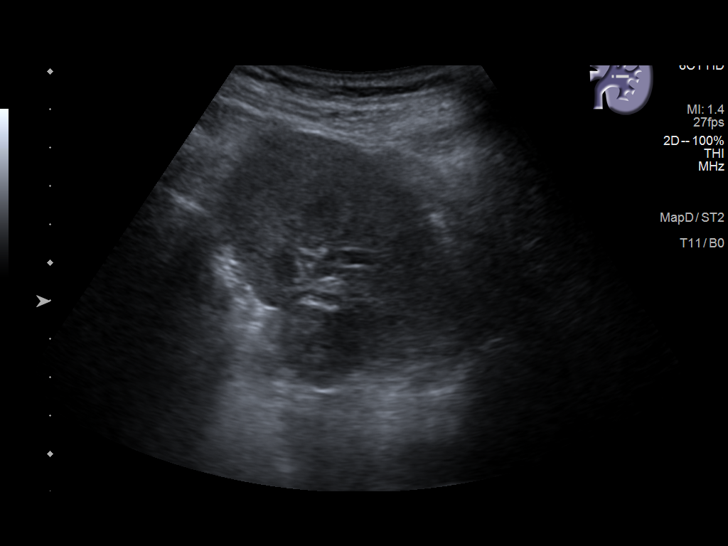
[im 53/64]
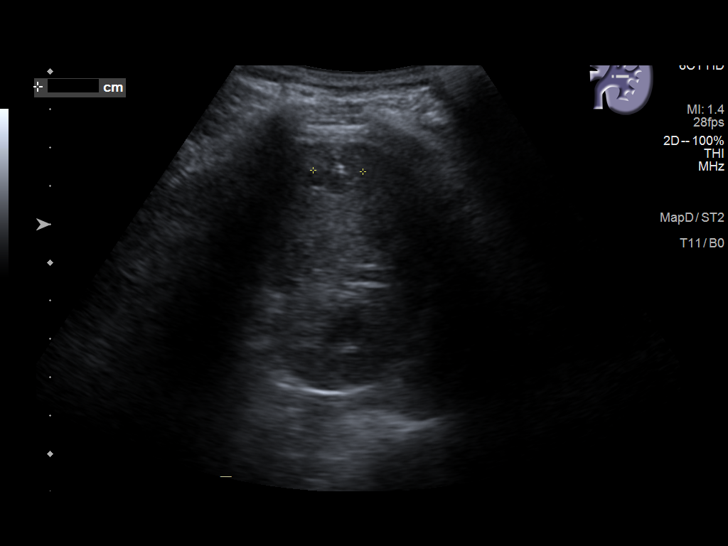
[im 58/64]
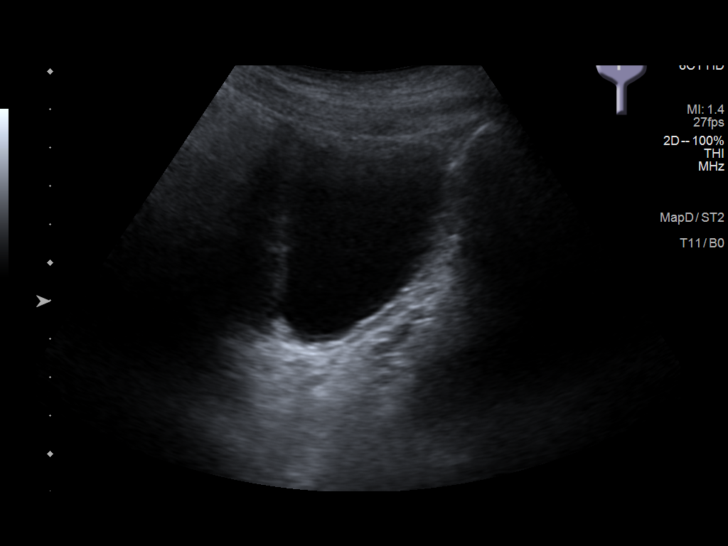
[im 64/64]
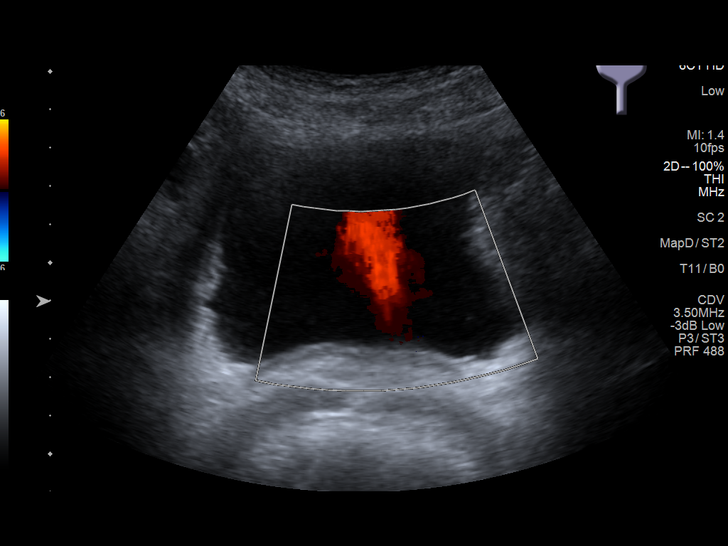

[13 of 25 positions shown; findings below may reference images not displayed]

FINDINGS: Right Kidney:

Renal measurements: 10.3 x 3.9 x 5.0 cm = volume: 106 mL. Normal
cortical thickness and echogenicity. Tiny cyst at mid RIGHT kidney 8
x 7 x 5 mm. No additional mass, hydronephrosis or shadowing
calcification

Left Kidney:

Renal measurements: 10.3 x 5.4 x 4.7 cm = volume: 138 mL. Normal
cortical thickness. Upper normal cortical echogenicity. Complicated
nodule with scattered internal echoes identified at upper pole,
x 1.3 x 1.1 cm. Additional complicated nodule with scattered low
level internal echogenicity and several small echogenic foci
identified at mid LEFT kidney, 1.3 x 1.0 x 1.3 cm. No hydronephrosis
or shadowing calculi.

Bladder:

Appears normal for degree of bladder distention. BILATERAL ureteral
jets visualized.
IMPRESSION: Tiny RIGHT renal cyst.

Two complicated hypoechoic nodules are identified at the LEFT
kidney, 1 of which corresponds to a lesion containing calcification
on the prior CT exam, without a definite second correlatives lesion
on prior CT.

These lesions are indeterminate by ultrasound; further
characterization by MR imaging with and without contrast
recommended.

## 2020-01-02 ENCOUNTER — Telehealth: Payer: Self-pay | Admitting: Family Medicine

## 2020-01-02 NOTE — Telephone Encounter (Signed)
Copied from [place] [id number]. Topic: Medicare AWV >> Jan 02, 2020 10:18 AM Cher Nakai R wrote: Reason for CRM No answer unable to leave message for patient to call back to reschedule the Medicare Annual Wellness Visit (AWV) virtually on Jan 04, 2020 to another day due to office closed.  Please schedule at anytime with [facility] Advisor.  45 minute appointment  Any questions, please call me at [phone]

## 2020-01-04 ENCOUNTER — Ambulatory Visit: Payer: Medicare PPO

## 2020-01-07 ENCOUNTER — Ambulatory Visit (INDEPENDENT_AMBULATORY_CARE_PROVIDER_SITE_OTHER): Payer: Medicare PPO

## 2020-01-07 VITALS — Ht 73.0 in | Wt 165.0 lb

## 2020-01-07 DIAGNOSIS — Z0000 Encounter for general adult medical examination without abnormal findings: Secondary | ICD-10-CM

## 2020-01-07 NOTE — Progress Notes (Signed)
I connected with Tyler Cohen today by telephone and verified that I am speaking with the correct person using two identifiers. Location patient: home Location provider: work Persons participating in the virtual visit: Tyler Cohen, Tyler Durand LPN.   I discussed the limitations, risks, security and privacy concerns of performing an evaluation and management service by telephone and the availability of in person appointments. I also discussed with the patient that there may be a patient responsible charge related to this service. The patient expressed understanding and verbally consented to this telephonic visit.    Interactive audio and video telecommunications were attempted between this provider and patient, however failed, due to patient having technical difficulties OR patient did not have access to video capability.  We continued and completed visit with audio only.     Vital signs may be patient reported or missing. Subjective:   Tyler Cohen is a 64 y.o. male who presents for Medicare Annual/Subsequent preventive examination.  Review of Systems     Cardiac Risk Factors include: advanced age (>87men, >61 women);smoking/ tobacco exposure     Objective:    Today's Vitals   01/07/20 0856  Weight: 165 lb (74.8 kg)  Height: 6\' 1"  (1.854 m)   Body mass index is 21.77 kg/m.  Advanced Directives 01/07/2020 04/30/2018 11/07/2017 10/14/2016 04/09/2016 12/01/2015 10/15/2015  Does Patient Have a Medical Advance Directive? No No No No No No No  Does patient want to make changes to medical advance directive? - - - Yes (MAU/Ambulatory/Procedural Areas - Information given) - - -  Would patient like information on creating a medical advance directive? - - Yes (MAU/Ambulatory/Procedural Areas - Information given) - - Yes - Educational materials given -    Current Medications (verified) Outpatient Encounter Medications as of 01/07/2020  Medication Sig  . albuterol (VENTOLIN HFA) 108 (90  Base) MCG/ACT inhaler Inhale 2 puffs into the lungs every 6 (six) hours as needed for wheezing or shortness of breath.  Marland Kitchen aspirin 81 MG tablet Take 81 mg by mouth daily.  Marland Kitchen atorvastatin (LIPITOR) 20 MG tablet TAKE 1 TABLET(20 MG) BY MOUTH AT BEDTIME  . b complex vitamins tablet Take 1 tablet by mouth daily.  . calcipotriene (DOVONOX) 0.005 % cream Apply topically 2 (two) times daily.  . diflorasone (PSORCON) 0.05 % ointment as needed.   Marland Kitchen econazole nitrate 1 % cream as needed.   . propylthiouracil (PTU) 50 MG tablet Take by mouth 2 (two) times daily.   . tamsulosin (FLOMAX) 0.4 MG CAPS capsule TAKE 1 CAPSULE(0.4 MG) BY MOUTH DAILY  . methimazole (TAPAZOLE) 10 MG tablet Take by mouth daily.   . naproxen (NAPROSYN) 500 MG tablet Take 1 tablet (500 mg total) by mouth 2 (two) times daily as needed (foot pain). (Patient not taking: Reported on 01/07/2020)   No facility-administered encounter medications on file as of 01/07/2020.    Allergies (verified) Patient has no known allergies.   History: Past Medical History:  Diagnosis Date  . Arthritis   . Heart murmur   . Hyperlipidemia   . Macrocytosis   . Thyroid disease   . Tobacco abuse    Past Surgical History:  Procedure Laterality Date  . COLONOSCOPY WITH PROPOFOL N/A 12/01/2015   Procedure: COLONOSCOPY WITH PROPOFOL;  Surgeon: Lucilla Lame, MD;  Location: [place];  Service: Endoscopy;  Laterality: N/A;  . POLYPECTOMY  12/01/2015   Procedure: POLYPECTOMY;  Surgeon: Lucilla Lame, MD;  Location: [place];  Service: Endoscopy;;   Family  History  Problem Relation Age of Onset  . Hypertension Mother   . Diabetes Mother        lost both legs  . Kidney disease Mother   . Hypertension Father   . Emphysema Father   . Sickle cell trait Father   . Hypertension Brother   . Hyperlipidemia Brother   . Sickle cell trait Sister    Social History   Socioeconomic History  . Marital status: Married    Spouse name: Not on  file  . Number of children: Not on file  . Years of education: Not on file  . Highest education level: High school graduate  Occupational History  . Not on file  Tobacco Use  . Smoking status: Current Every Day Smoker    Packs/day: 0.25    Years: 42.00    Pack years: 10.50    Types: Cigarettes  . Smokeless tobacco: Never Used  . Tobacco comment: 1 pack in 4 days   Vaping Use  . Vaping Use: Former  Substance and Sexual Activity  . Alcohol use: No  . Drug use: Yes    Types: Marijuana    Comment: pt states he smokes every once in a while  . Sexual activity: Yes    Birth control/protection: None  Other Topics Concern  . Not on file  Social History Narrative   Works part time.   Social Determinants of Health   Financial Resource Strain: Medium Risk  . Difficulty of Paying Living Expenses: Somewhat hard  Food Insecurity: No Food Insecurity  . Worried About Charity fundraiser in the Last Year: Never true  . Ran Out of Food in the Last Year: Never true  Transportation Needs: No Transportation Needs  . Lack of Transportation (Medical): No  . Lack of Transportation (Non-Medical): No  Physical Activity: Insufficiently Active  . Days of Exercise per Week: 1 day  . Minutes of Exercise per Session: 60 min  Stress: No Stress Concern Present  . Feeling of Stress : Not at all  Social Connections: Socially Integrated  . Frequency of Communication with Friends and Family: More than three times a week  . Frequency of Social Gatherings with Friends and Family: More than three times a week  . Attends Religious Services: More than 4 times per year  . Active Member of Clubs or Organizations: Yes  . Attends Archivist Meetings: More than 4 times per year  . Marital Status: Married    Tobacco Counseling Ready to quit: Yes Counseling given: Not Answered Comment: 1 pack in 4 days    Clinical Intake:  Pre-visit preparation completed: Yes  Pain : No/denies pain      Nutritional Status: BMI of 19-24  Normal Nutritional Risks: None Diabetes: No  How often do you need to have someone help you when you read instructions, pamphlets, or other written materials from your doctor or pharmacy?: 1 - Never What is the last grade level you completed in school?: 12th grade  Diabetic? no  Interpreter Needed?: No  Information entered by :: NAllen LPN   Activities of Daily Living In your present state of health, do you have any difficulty performing the following activities: 01/07/2020  Hearing? N  Vision? N  Difficulty concentrating or making decisions? N  Walking or climbing stairs? N  Dressing or bathing? N  Doing errands, shopping? N  Preparing Food and eating ? N  Using the Toilet? N  In the past six months, have you  accidently leaked urine? N  Do you have problems with loss of bowel control? N  Managing your Medications? N  Managing your Finances? N  Housekeeping or managing your Housekeeping? N  Some recent data might be hidden    Patient Care Team: Valerie Roys, DO as PCP - General (Family Medicine) Vanita Ingles, RN as Case Manager (General Practice)  Indicate any recent Medical Services you may have received from other than [facility] providers in the past year (date may be approximate).     Assessment:   This is a routine wellness examination for Jesper.  Hearing/Vision screen  Hearing Screening   125Hz  250Hz  500Hz  1000Hz  2000Hz  3000Hz  4000Hz  6000Hz  8000Hz   Right ear:           Left ear:           Vision Screening Comments: No regular eye exams,   Dietary issues and exercise activities discussed: Current Exercise Habits: Home exercise routine, Type of exercise: Other - see comments (bike riding), Time (Minutes): 60, Frequency (Times/Week): 1, Weekly Exercise (Minutes/Week): 60  Goals    .  Patient Stated      01/07/2020, no goals    .  Quit Smoking      Smoking cessation discussed     .  Quit smoking / using tobacco       SMoking cessation disscussed    .  RNCM-pt"I try to eat healthy" (pt-stated)      CARE PLAN ENTRY (see longtitudinal plan of care for additional care plan information)  Current Barriers:  . Chronic Disease Management support, education, and care coordination needs related to HLD and Hyperthyroidism   Clinical Goal(s) related to HLD and Hyperthyroidism  :  Over the next 120 days, patient will:  . Work with the care management team to address educational, disease management, and care coordination needs  . Begin or continue self health monitoring activities as directed today  adhere to a heart healthy diet . Call provider office for new or worsened signs and symptoms Oxygen saturation lower than established parameter, Shortness of breath, and New or worsened symptom related to HLD, Hyperthyroidism and other chronic conditions  . Call care management team with questions or concerns . Verbalize basic understanding of patient centered plan of care established today  Interventions related to HLD and Hyperthyroidism :  . Evaluation of current treatment plans and patient's adherence to plan as established by provider.  12-18-2019: The patient states he is doing well health wise. He denies any further issues with his ears. He is eating well and taking his medications. Biggest concern is housing for him.  . Assessed patient understanding of disease states. 12-18-2019: Review of chronic conditions. The patient states he is doing well taking care of himself. He is watching what he eats, trying to stop smoking, still looking for stable housing for him and his wife, and exercising more by riding his bike at night when it is cooler for an hour or more. Praised the patient for positive change. The patient states he feels so much better when he is out riding his bike and can tell a big difference.  . Assessed patient's education and care coordination needs.  Care guide referral for help with housing needs. The  patient is currently living with a cousin and his wife is in a facility. He has a new phone number: [phone].  Information provided for the CCM team and for the care guides reaching out to him to help with  housing needs. 12-18-2019: The patient has received the information on housing and has called the numbers. He is waiting for call backs. The patient says his wife is ready to come home but she can not until they find a place to live. The patient has been working with the care guides.  . Provided disease specific education to patient. Review of heart healthy diet. The patient verbalized he tries to eat healthy. 12-18-2019:  The patient endorses following a heart healthy diet. He has cut back on salt and fats in his diet.  . Evaluation of exercise regimen. The patient does not do any structured exercise but states he does mow yards. 12-18-2019: The patient is riding his bike 2 to 4 times a week for over an hour. This is helping him feel better and he plans on increasing his riding. He does this when it is cooler at night.  Nash Dimmer with appropriate clinical care team members regarding patient needs.  Denies any needs from the LCSW and pharmacist. Will update the team that he has a new number. Also will let them know about care guide referral for housing. . Evaluation of next appointment. The patient sees pcp 01-14-2020. Next appointment with [facility] 03-04-2020 at 11:30 am.  Has updated care team information to call if needed sooner.  . Evaluation of smoking cessation. The patient smokes 4 packs of cigars a day. There are 2 in a pack. He has cut back considerably and will call for help with stopping. Praised patient for accomplishments and will continue to monitor for needs.   Patient Self Care Activities related to HLD and Hyperthyroidism :  . Patient is unable to independently self-manage chronic health conditions  Please see past updates related to this goal by clicking on the "Past Updates" button  in the selected goal        Depression Screen PHQ 2/9 Scores 01/07/2020 07/24/2019 12/26/2018 10/27/2018 11/08/2017 11/07/2017 10/14/2016  PHQ - 2 Score 0 0 0 0 0 0 0  PHQ- 9 Score - - 1 2 0 - -    Fall Risk Fall Risk  01/07/2020 10/27/2018 11/07/2017 10/14/2016 10/15/2015  Falls in the past year? 0 0 No No No  Number falls in past yr: - 0 - - -  Injury with Fall? - 0 - - -  Risk for fall due to : Medication side effect - - - -  Follow up Falls evaluation completed;Education provided;Falls prevention discussed Falls evaluation completed - - -    Any stairs in or around the home? Yes  If so, are there any without handrails? No  Home free of loose throw rugs in walkways, pet beds, electrical cords, etc? Yes  Adequate lighting in your home to reduce risk of falls? Yes   ASSISTIVE DEVICES UTILIZED TO PREVENT FALLS:  Life alert? No  Use of a cane, walker or w/c? No  Grab bars in the bathroom? No  Shower chair or bench in shower? No  Elevated toilet seat or a handicapped toilet? No   TIMED UP AND GO:  Was the test performed? No .   Cognitive Function:     6CIT Screen 01/07/2020 12/26/2018 10/27/2018 11/07/2017 10/14/2016  What Year? 0 points 0 points 0 points 0 points 0 points  What month? 0 points 0 points 0 points 0 points 0 points  What time? 0 points 0 points 0 points 0 points 0 points  Count back from 20 0 points 2 points 0 points 0 points 0  points  Months in reverse 0 points 0 points 0 points 0 points 0 points  Repeat phrase 0 points 2 points 4 points 0 points 0 points  Total Score 0 4 4 0 0    Immunizations Immunization History  Administered Date(s) Administered  . Influenza,inj,Quad PF,6+ Mos 02/17/2016, 05/04/2017, 03/10/2018, 12/26/2018  . PFIZER SARS-COV-2 Vaccination 07/01/2019, 07/24/2019  . Pneumococcal Polysaccharide-23 01/23/2014  . Tdap 10/24/2013    TDAP status: Up to date Flu Vaccine status: Up to date Pneumococcal vaccine status: Up to date Covid-19 vaccine  status: Completed vaccines  Qualifies for Shingles Vaccine? Yes   Zostavax completed No   Shingrix Completed?: No.    Education has been provided regarding the importance of this vaccine. Patient has been advised to call insurance company to determine out of pocket expense if they have not yet received this vaccine. Advised may also receive vaccine at local pharmacy or Health Dept. Verbalized acceptance and understanding.  Screening Tests Health Maintenance  Topic Date Due  . INFLUENZA VACCINE  11/04/2019  . TETANUS/TDAP  10/25/2023  . COLONOSCOPY  11/30/2025  . COVID-19 Vaccine  Completed  . Hepatitis C Screening  Completed  . HIV Screening  Completed    Health Maintenance  Health Maintenance Due  Topic Date Due  . INFLUENZA VACCINE  11/04/2019    Colorectal cancer screening: Completed 12/01/2015. Repeat every 10 years  Lung Cancer Screening: (Low Dose CT Chest recommended if Age 7-80 years, 30 pack-year currently smoking OR have quit w/in 15years.) does not qualify.   Lung Cancer Screening Referral: no  Additional Screening:  Hepatitis C Screening: does qualify; Completed 09/03/2015   Vision Screening: Recommended annual ophthalmology exams for early detection of glaucoma and other disorders of the eye. Is the patient up to date with their annual eye exam?  No  Who is the provider or what is the name of the office in which the patient attends annual eye exams? none If pt is not established with a provider, would they like to be referred to a provider to establish care? No .   Dental Screening: Recommended annual dental exams for proper oral hygiene  Community Resource Referral / Chronic Care Management: CRR required this visit?  No   CCM required this visit?  No      Plan:     I have personally reviewed and noted the following in the patient's chart:   . Medical and social history . Use of alcohol, tobacco or illicit drugs  . Current medications and  supplements . Functional ability and status . Nutritional status . Physical activity . Advanced directives . List of other physicians . Hospitalizations, surgeries, and ER visits in previous 12 months . Vitals . Screenings to include cognitive, depression, and falls . Referrals and appointments  In addition, I have reviewed and discussed with patient certain preventive protocols, quality metrics, and best practice recommendations. A written personalized care plan for preventive services as well as general preventive health recommendations were provided to patient.     Kellie Simmering, LPN   [id number]/07/[id number]   Nurse Notes:

## 2020-01-07 NOTE — Patient Instructions (Signed)
Tyler Cohen , Thank you for taking time to come for your Medicare Wellness Visit. I appreciate your ongoing commitment to your health goals. Please review the following plan we discussed and let me know if I can assist you in the future.   Screening recommendations/referrals: Colonoscopy: completed 12/01/2015 Recommended yearly ophthalmology/optometry visit for glaucoma screening and checkup Recommended yearly dental visit for hygiene and checkup  Vaccinations: Influenza vaccine: due Pneumococcal vaccine: completed 01/23/2014 Tdap vaccine: completed 10/24/2013 Shingles vaccine: discussed   Covid-19:  07/24/2019, 07/01/2019  Advanced directives: Advance directive discussed with you today.  Conditions/risks identified: smoking  Next appointment: Follow up in one year for your annual wellness visit   Preventive Care 40-64 Years, Male Preventive care refers to lifestyle choices and visits with your health care provider that can promote health and wellness. What does preventive care include?  A yearly physical exam. This is also called an annual well check.  Dental exams once or twice a year.  Routine eye exams. Ask your health care provider how often you should have your eyes checked.  Personal lifestyle choices, including:  Daily care of your teeth and gums.  Regular physical activity.  Eating a healthy diet.  Avoiding tobacco and drug use.  Limiting alcohol use.  Practicing safe sex.  Taking low-dose aspirin every day starting at age 50. What happens during an annual well check? The services and screenings done by your health care provider during your annual well check will depend on your age, overall health, lifestyle risk factors, and family history of disease. Counseling  Your health care provider may ask you questions about your:  Alcohol use.  Tobacco use.  Drug use.  Emotional well-being.  Home and relationship well-being.  Sexual activity.  Eating  habits.  Work and work Statistician. Screening  You may have the following tests or measurements:  Height, weight, and BMI.  Blood pressure.  Lipid and cholesterol levels. These may be checked every 5 years, or more frequently if you are over 41 years old.  Skin check.  Lung cancer screening. You may have this screening every year starting at age 5 if you have a 30-pack-year history of smoking and currently smoke or have quit within the past 15 years.  Fecal occult blood test (FOBT) of the stool. You may have this test every year starting at age 12.  Flexible sigmoidoscopy or colonoscopy. You may have a sigmoidoscopy every 5 years or a colonoscopy every 10 years starting at age 82.  Prostate cancer screening. Recommendations will vary depending on your family history and other risks.  Hepatitis C blood test.  Hepatitis B blood test.  Sexually transmitted disease (STD) testing.  Diabetes screening. This is done by checking your blood sugar (glucose) after you have not eaten for a while (fasting). You may have this done every 1-3 years. Discuss your test results, treatment options, and if necessary, the need for more tests with your health care provider. Vaccines  Your health care provider may recommend certain vaccines, such as:  Influenza vaccine. This is recommended every year.  Tetanus, diphtheria, and acellular pertussis (Tdap, Td) vaccine. You may need a Td booster every 10 years.  Zoster vaccine. You may need this after age 72.  Pneumococcal 13-valent conjugate (PCV13) vaccine. You may need this if you have certain conditions and have not been vaccinated.  Pneumococcal polysaccharide (PPSV23) vaccine. You may need one or two doses if you smoke cigarettes or if you have certain conditions. Talk to your  health care provider about which screenings and vaccines you need and how often you need them. This information is not intended to replace advice given to you by your  health care provider. Make sure you discuss any questions you have with your health care provider. Document Released: 04/18/2015 Document Revised: 12/10/2015 Document Reviewed: 01/21/2015 Elsevier Interactive Patient Education  2017 [employer] Prevention in the Home Falls can cause injuries. They can happen to people of all ages. There are many things you can do to make your home safe and to help prevent falls. What can I do on the outside of my home?  Regularly fix the edges of walkways and driveways and fix any cracks.  Remove anything that might make you trip as you walk through a door, such as a raised step or threshold.  Trim any bushes or trees on the path to your home.  Use bright outdoor lighting.  Clear any walking paths of anything that might make someone trip, such as rocks or tools.  Regularly check to see if handrails are loose or broken. Make sure that both sides of any steps have handrails.  Any raised decks and porches should have guardrails on the edges.  Have any leaves, snow, or ice cleared regularly.  Use sand or salt on walking paths during winter.  Clean up any spills in your garage right away. This includes oil or grease spills. What can I do in the bathroom?  Use night lights.  Install grab bars by the toilet and in the tub and shower. Do not use towel bars as grab bars.  Use non-skid mats or decals in the tub or shower.  If you need to sit down in the shower, use a plastic, non-slip stool.  Keep the floor dry. Clean up any water that spills on the floor as soon as it happens.  Remove soap buildup in the tub or shower regularly.  Attach bath mats securely with double-sided non-slip rug tape.  Do not have throw rugs and other things on the floor that can make you trip. What can I do in the bedroom?  Use night lights.  Make sure that you have a light by your bed that is easy to reach.  Do not use any sheets or blankets that are too big  for your bed. They should not hang down onto the floor.  Have a firm chair that has side arms. You can use this for support while you get dressed.  Do not have throw rugs and other things on the floor that can make you trip. What can I do in the kitchen?  Clean up any spills right away.  Avoid walking on wet floors.  Keep items that you use a lot in easy-to-reach places.  If you need to reach something above you, use a strong step stool that has a grab bar.  Keep electrical cords out of the way.  Do not use floor polish or wax that makes floors slippery. If you must use wax, use non-skid floor wax.  Do not have throw rugs and other things on the floor that can make you trip. What can I do with my stairs?  Do not leave any items on the stairs.  Make sure that there are handrails on both sides of the stairs and use them. Fix handrails that are broken or loose. Make sure that handrails are as long as the stairways.  Check any carpeting to make sure that it is  firmly attached to the stairs. Fix any carpet that is loose or worn.  Avoid having throw rugs at the top or bottom of the stairs. If you do have throw rugs, attach them to the floor with carpet tape.  Make sure that you have a light switch at the top of the stairs and the bottom of the stairs. If you do not have them, ask someone to add them for you. What else can I do to help prevent falls?  Wear shoes that:  Do not have high heels.  Have rubber bottoms.  Are comfortable and fit you well.  Are closed at the toe. Do not wear sandals.  If you use a stepladder:  Make sure that it is fully opened. Do not climb a closed stepladder.  Make sure that both sides of the stepladder are locked into place.  Ask someone to hold it for you, if possible.  Clearly mark and make sure that you can see:  Any grab bars or handrails.  First and last steps.  Where the edge of each step is.  Use tools that help you move around  (mobility aids) if they are needed. These include:  Canes.  Walkers.  Scooters.  Crutches.  Turn on the lights when you go into a dark area. Replace any light bulbs as soon as they burn out.  Set up your furniture so you have a clear path. Avoid moving your furniture around.  If any of your floors are uneven, fix them.  If there are any pets around you, be aware of where they are.  Review your medicines with your doctor. Some medicines can make you feel dizzy. This can increase your chance of falling. Ask your doctor what other things that you can do to help prevent falls. This information is not intended to replace advice given to you by your health care provider. Make sure you discuss any questions you have with your health care provider. Document Released: 01/16/2009 Document Revised: 08/28/2015 Document Reviewed: 04/26/2014 Elsevier Interactive Patient Education  2017 [employer].

## 2020-01-14 ENCOUNTER — Other Ambulatory Visit: Payer: Self-pay

## 2020-01-14 ENCOUNTER — Ambulatory Visit (INDEPENDENT_AMBULATORY_CARE_PROVIDER_SITE_OTHER): Payer: Medicare PPO | Admitting: Family Medicine

## 2020-01-14 ENCOUNTER — Encounter: Payer: Self-pay | Admitting: Family Medicine

## 2020-01-14 VITALS — BP 104/69 | HR 88 | Temp 98.4°F | Resp 16 | Ht 73.0 in | Wt 150.8 lb

## 2020-01-14 DIAGNOSIS — R350 Frequency of micturition: Secondary | ICD-10-CM | POA: Diagnosis not present

## 2020-01-14 DIAGNOSIS — H5210 Myopia, unspecified eye: Secondary | ICD-10-CM

## 2020-01-14 DIAGNOSIS — N2889 Other specified disorders of kidney and ureter: Secondary | ICD-10-CM

## 2020-01-14 DIAGNOSIS — Z23 Encounter for immunization: Secondary | ICD-10-CM | POA: Diagnosis not present

## 2020-01-14 DIAGNOSIS — Z59819 Housing instability, housed unspecified: Secondary | ICD-10-CM

## 2020-01-14 DIAGNOSIS — Z599 Problem related to housing and economic circumstances, unspecified: Secondary | ICD-10-CM | POA: Diagnosis not present

## 2020-01-14 DIAGNOSIS — Z0000 Encounter for general adult medical examination without abnormal findings: Secondary | ICD-10-CM

## 2020-01-14 DIAGNOSIS — N401 Enlarged prostate with lower urinary tract symptoms: Secondary | ICD-10-CM

## 2020-01-14 DIAGNOSIS — E0590 Thyrotoxicosis, unspecified without thyrotoxic crisis or storm: Secondary | ICD-10-CM

## 2020-01-14 DIAGNOSIS — E7800 Pure hypercholesterolemia, unspecified: Secondary | ICD-10-CM | POA: Diagnosis not present

## 2020-01-14 DIAGNOSIS — E278 Other specified disorders of adrenal gland: Secondary | ICD-10-CM | POA: Diagnosis not present

## 2020-01-14 LAB — URINALYSIS, ROUTINE W REFLEX MICROSCOPIC
Bilirubin, UA: NEGATIVE
Glucose, UA: NEGATIVE
Ketones, UA: NEGATIVE
Leukocytes,UA: NEGATIVE
Nitrite, UA: NEGATIVE
Protein,UA: NEGATIVE
RBC, UA: NEGATIVE
Specific Gravity, UA: 1.01 (ref 1.005–1.030)
Urobilinogen, Ur: 0.2 mg/dL (ref 0.2–1.0)
pH, UA: 6.5 (ref 5.0–7.5)

## 2020-01-14 MED ORDER — ALBUTEROL SULFATE HFA 108 (90 BASE) MCG/ACT IN AERS: 2.0000 | INHALATION_SPRAY | Freq: Four times a day (QID) | RESPIRATORY_TRACT | 6 refills | Status: DC | PRN

## 2020-01-14 MED ORDER — ATORVASTATIN CALCIUM 20 MG PO TABS
ORAL_TABLET | ORAL | 1 refills | Status: DC
Start: 2020-01-14 — End: 2020-07-02

## 2020-01-14 MED ORDER — TAMSULOSIN HCL 0.4 MG PO CAPS
ORAL_CAPSULE | ORAL | 1 refills | Status: DC
Start: 2020-01-14 — End: 2020-07-02

## 2020-01-14 NOTE — Patient Instructions (Signed)

## 2020-01-14 NOTE — Assessment & Plan Note (Signed)
Seeing urology in 2 weeks. Await their input.

## 2020-01-14 NOTE — Assessment & Plan Note (Signed)
Under good control on current regimen. Continue current regimen. Continue to monitor. Call with any concerns. Refills given. Labs drawn today.   

## 2020-01-14 NOTE — Assessment & Plan Note (Signed)
Has not followed up with endocrine. Rechecking labs today. Will likely restart his medicine. Await results.

## 2020-01-14 NOTE — Progress Notes (Signed)
BP 104/69   Pulse 88   Temp 98.4 F (36.9 C) (Oral)   Resp 16   Ht 6\' 1"  (1.854 m)   Wt 150 lb 12.8 oz (68.4 kg)   SpO2 99%   BMI 19.90 kg/m    Subjective:    Patient ID: Tyler Cohen, male    DOB: 06/30/1955, 64 y.o.   MRN: [id number]  HPI: Tyler Cohen is a 64 y.o. male presenting on 01/14/2020 for comprehensive medical examination. Current medical complaints include:  HYPERTHYROIDISM Thyroid control status:uncontrolled Satisfied with current treatment? no Medication side effects: unclear if he's taking anything Medication compliance: poor compliance Recent dose adjustment:no Fatigue: no Cold intolerance: no Heat intolerance: no Weight gain: no Weight loss: no Constipation: no Diarrhea/loose stools: no Palpitations: no Lower extremity edema: no Anxiety/depressed mood: no   HYPERLIPIDEMIA Hyperlipidemia status: excellent compliance Satisfied with current treatment?  yes Side effects:  no Medication compliance: excellent compliance Past cholesterol meds: atorvastatin Supplements: none Aspirin:  yes The ASCVD Risk score Mikey Bussing DC Jr., et al., 2013) failed to calculate for the following reasons:   The valid total cholesterol range is 130 to 320 mg/dL Chest pain:  no Coronary artery disease:  no  BPH BPH status: controlled Satisfied with current treatment?: yes Medication side effects: no Medication compliance: excellent compliance Duration: chronic Nocturia: no Urinary frequency:yes Incomplete voiding: no Urgency: no Weak urinary stream: no Straining to start stream: no Dysuria: no Onset: gradual Severity: mild  Interim Problems from his last visit: no  Depression Screen done today and results listed below:  Depression screen [facility] 2/9 01/14/2020 01/07/2020 07/24/2019 12/26/2018 10/27/2018  Decreased Interest 0 0 0 0 0  Down, Depressed, Hopeless 0 0 0 0 0  PHQ - 2 Score 0 0 0 0 0  Altered sleeping - - - 0 1  Tired, decreased energy - - - 0 0    Change in appetite - - - 1 1  Feeling bad or failure about yourself  - - - 0 0  Trouble concentrating - - - 0 0  Moving slowly or fidgety/restless - - - 0 0  Suicidal thoughts - - - 0 0  PHQ-9 Score - - - 1 2  Difficult doing work/chores - - - Not difficult at all Not difficult at all    Past Medical History:  Past Medical History:  Diagnosis Date  . Arthritis   . Heart murmur   . Hyperlipidemia   . Macrocytosis   . Thyroid disease   . Tobacco abuse     Surgical History:  Past Surgical History:  Procedure Laterality Date  . COLONOSCOPY WITH PROPOFOL N/A 12/01/2015   Procedure: COLONOSCOPY WITH PROPOFOL;  Surgeon: Lucilla Lame, MD;  Location: [place];  Service: Endoscopy;  Laterality: N/A;  . POLYPECTOMY  12/01/2015   Procedure: POLYPECTOMY;  Surgeon: Lucilla Lame, MD;  Location: [place];  Service: Endoscopy;;    Medications:  Current Outpatient Medications on File Prior to Visit  Medication Sig  . aspirin 81 MG tablet Take 81 mg by mouth daily.  Marland Kitchen b complex vitamins tablet Take 1 tablet by mouth daily.  . calcipotriene (DOVONOX) 0.005 % cream Apply topically 2 (two) times daily.  . diflorasone (PSORCON) 0.05 % ointment as needed.   Marland Kitchen econazole nitrate 1 % cream as needed.   . propylthiouracil (PTU) 50 MG tablet Take by mouth 2 (two) times daily.   . methimazole (TAPAZOLE) 10 MG tablet Take by mouth  daily.    No current facility-administered medications on file prior to visit.    Allergies:  No Known Allergies  Social History:  Social History   Socioeconomic History  . Marital status: Married    Spouse name: Not on file  . Number of children: Not on file  . Years of education: Not on file  . Highest education level: High school graduate  Occupational History  . Not on file  Tobacco Use  . Smoking status: Current Every Day Smoker    Packs/day: 0.25    Years: 42.00    Pack years: 10.50    Types: Cigarettes  . Smokeless tobacco: Never  Used  . Tobacco comment: 1 pack in 4 days   Vaping Use  . Vaping Use: Former  Substance and Sexual Activity  . Alcohol use: No  . Drug use: Yes    Types: Marijuana    Comment: pt states he smokes every once in a while  . Sexual activity: Yes    Birth control/protection: None  Other Topics Concern  . Not on file  Social History Narrative   Works part time.   Social Determinants of Health   Financial Resource Strain: Medium Risk  . Difficulty of Paying Living Expenses: Somewhat hard  Food Insecurity: No Food Insecurity  . Worried About Charity fundraiser in the Last Year: Never true  . Ran Out of Food in the Last Year: Never true  Transportation Needs: No Transportation Needs  . Lack of Transportation (Medical): No  . Lack of Transportation (Non-Medical): No  Physical Activity: Insufficiently Active  . Days of Exercise per Week: 1 day  . Minutes of Exercise per Session: 60 min  Stress: No Stress Concern Present  . Feeling of Stress : Not at all  Social Connections: Socially Integrated  . Frequency of Communication with Friends and Family: More than three times a week  . Frequency of Social Gatherings with Friends and Family: More than three times a week  . Attends Religious Services: More than 4 times per year  . Active Member of Clubs or Organizations: Yes  . Attends Archivist Meetings: More than 4 times per year  . Marital Status: Married  Human resources officer Violence: Not At Risk  . Fear of Current or Ex-Partner: No  . Emotionally Abused: No  . Physically Abused: No  . Sexually Abused: No   Social History   Tobacco Use  Smoking Status Current Every Day Smoker  . Packs/day: 0.25  . Years: 42.00  . Pack years: 10.50  . Types: Cigarettes  Smokeless Tobacco Never Used  Tobacco Comment   1 pack in 4 days    Social History   Substance and Sexual Activity  Alcohol Use No    Family History:  Family History  Problem Relation Age of Onset  .  Hypertension Mother   . Diabetes Mother        lost both legs  . Kidney disease Mother   . Hypertension Father   . Emphysema Father   . Sickle cell trait Father   . Hypertension Brother   . Hyperlipidemia Brother   . Sickle cell trait Sister     Past medical history, surgical history, medications, allergies, family history and social history reviewed with patient today and changes made to appropriate areas of the chart.   Review of Systems  Constitutional: Negative.   HENT: Negative.   Eyes: Negative.   Respiratory: Negative.   Cardiovascular: Negative.  Gastrointestinal: Negative.   Genitourinary: Positive for frequency. Negative for dysuria, flank pain, hematuria and urgency.  Musculoskeletal: Negative.        Cramps in his feet  Skin: Negative.   Neurological: Positive for tingling. Negative for dizziness, tremors, sensory change, speech change, focal weakness, seizures, loss of consciousness, weakness and headaches.  Endo/Heme/Allergies: Negative.   Psychiatric/Behavioral: Negative.     All other ROS negative except what is listed above and in the HPI.      Objective:    BP 104/69   Pulse 88   Temp 98.4 F (36.9 C) (Oral)   Resp 16   Ht 6\' 1"  (1.854 m)   Wt 150 lb 12.8 oz (68.4 kg)   SpO2 99%   BMI 19.90 kg/m   Wt Readings from Last 3 Encounters:  01/14/20 150 lb 12.8 oz (68.4 kg)  01/07/20 165 lb (74.8 kg)  09/25/19 152 lb 12.8 oz (69.3 kg)    Physical Exam Vitals and nursing note reviewed.  Constitutional:      General: He is not in acute distress.    Appearance: Normal appearance. He is not ill-appearing, toxic-appearing or diaphoretic.     Comments: Thin   HENT:     Head: Normocephalic and atraumatic.     Right Ear: Tympanic membrane, ear canal and external ear normal. There is no impacted cerumen.     Left Ear: Tympanic membrane, ear canal and external ear normal. There is no impacted cerumen.     Nose: Nose normal. No congestion or rhinorrhea.       Mouth/Throat:     Mouth: Mucous membranes are moist.     Pharynx: Oropharynx is clear. No oropharyngeal exudate or posterior oropharyngeal erythema.  Eyes:     General: No scleral icterus.       Right eye: No discharge.        Left eye: No discharge.     Extraocular Movements: Extraocular movements intact.     Conjunctiva/sclera: Conjunctivae normal.     Pupils: Pupils are equal, round, and reactive to light.  Neck:     Vascular: No carotid bruit.  Cardiovascular:     Rate and Rhythm: Normal rate and regular rhythm.     Pulses: Normal pulses.     Heart sounds: No murmur heard.  No friction rub. No gallop.   Pulmonary:     Effort: Pulmonary effort is normal. No respiratory distress.     Breath sounds: Normal breath sounds. No stridor. No wheezing, rhonchi or rales.  Chest:     Chest wall: No tenderness.  Abdominal:     General: Abdomen is flat. Bowel sounds are normal. There is no distension.     Palpations: Abdomen is soft. There is no mass.     Tenderness: There is no abdominal tenderness. There is no right CVA tenderness, left CVA tenderness, guarding or rebound.     Hernia: No hernia is present.  Genitourinary:    Comments: Genital exam deferred with shared decision making Musculoskeletal:        General: No swelling, tenderness, deformity or signs of injury.     Cervical back: Normal range of motion and neck supple. No rigidity. No muscular tenderness.     Right lower leg: No edema.     Left lower leg: No edema.  Lymphadenopathy:     Cervical: No cervical adenopathy.  Skin:    General: Skin is warm and dry.     Capillary Refill: Capillary refill takes  less than 2 seconds.     Coloration: Skin is not jaundiced or pale.     Findings: No bruising, erythema, lesion or rash.  Neurological:     General: No focal deficit present.     Mental Status: He is alert and oriented to person, place, and time.     Cranial Nerves: No cranial nerve deficit.     Sensory: No sensory  deficit.     Motor: No weakness.     Coordination: Coordination normal.     Gait: Gait normal.     Deep Tendon Reflexes: Reflexes normal.  Psychiatric:        Mood and Affect: Mood normal.        Behavior: Behavior normal.        Thought Content: Thought content normal.        Judgment: Judgment normal.     Results for orders placed or performed in visit on 09/10/19  CBC with Differential/Platelet  Result Value Ref Range   WBC 6.0 3.4 - 10.8 x10E3/uL   RBC 4.72 4.14 - 5.80 x10E6/uL   Hemoglobin 14.5 13.0 - 17.7 g/dL   Hematocrit 43.4 37.5 - 51.0 %   MCV 92 79 - 97 fL   MCH 30.7 26.6 - 33.0 pg   MCHC 33.4 31 - 35 g/dL   RDW 12.1 11.6 - 15.4 %   Platelets 189 150 - 450 x10E3/uL   Neutrophils 44 Not Estab. %   Lymphs 44 Not Estab. %   Monocytes 8 Not Estab. %   Eos 3 Not Estab. %   Basos 1 Not Estab. %   Neutrophils Absolute 2.7 1 - 7 x10E3/uL   Lymphocytes Absolute 2.7 0 - 3 x10E3/uL   Monocytes Absolute 0.5 0 - 0 x10E3/uL   EOS (ABSOLUTE) 0.2 0.0 - 0.4 x10E3/uL   Basophils Absolute 0.0 0 - 0 x10E3/uL   Immature Granulocytes 0 Not Estab. %   Immature Grans (Abs) 0.0 0.0 - 0.1 x10E3/uL  Comprehensive metabolic panel  Result Value Ref Range   Glucose 96 65 - 99 mg/dL   BUN 14 8 - 27 mg/dL   Creatinine, Ser 0.87 0.76 - 1.27 mg/dL   GFR calc non Af Amer 92 >59 mL/min/1.73   GFR calc Af Amer 106 >59 mL/min/1.73   BUN/Creatinine Ratio 16 10 - 24   Sodium 138 134 - 144 mmol/L   Potassium 4.4 3.5 - 5.2 mmol/L   Chloride 104 96 - 106 mmol/L   CO2 21 20 - 29 mmol/L   Calcium 9.6 8.6 - 10.2 mg/dL   Total Protein 6.4 6.0 - 8.5 g/dL   Albumin 3.9 3.8 - 4.8 g/dL   Globulin, Total 2.5 1.5 - 4.5 g/dL   Albumin/Globulin Ratio 1.6 1.2 - 2.2   Bilirubin Total 0.5 0.0 - 1.2 mg/dL   Alkaline Phosphatase 145 (H) 48 - 121 IU/L   AST 18 0 - 40 IU/L   ALT 24 0 - 44 IU/L  Lipid Panel w/o Chol/HDL Ratio  Result Value Ref Range   Cholesterol, Total 128 100 - 199 mg/dL   Triglycerides  54 0 - 149 mg/dL   HDL 36 (L) >39 mg/dL   VLDL Cholesterol Cal 12 5 - 40 mg/dL   LDL Chol Calc (NIH) 80 0 - 99 mg/dL  PSA  Result Value Ref Range   Prostate Specific Ag, Serum 3.6 0.0 - 4.0 ng/mL  TSH  Result Value Ref Range   TSH <0.005 (L) 0.450 -  4.500 uIU/mL      Assessment & Plan:   Problem List Items Addressed This Visit      Endocrine   Hyperthyroidism    Has not followed up with endocrine. Rechecking labs today. Will likely restart his medicine. Await results.       Relevant Orders   CBC with Differential/Platelet   Comprehensive metabolic panel   TSH     Genitourinary   BPH (benign prostatic hyperplasia)    Under good control on current regimen. Continue current regimen. Continue to monitor. Call with any concerns. Refills given. Labs drawn today.       Relevant Medications   tamsulosin (FLOMAX) 0.4 MG CAPS capsule   Other Relevant Orders   CBC with Differential/Platelet   Comprehensive metabolic panel   PSA   Urinalysis, Routine w reflex microscopic     Other   Hyperlipidemia    Under good control on current regimen. Continue current regimen. Continue to monitor. Call with any concerns. Refills given. Labs drawn today.       Relevant Medications   atorvastatin (LIPITOR) 20 MG tablet   Other Relevant Orders   CBC with Differential/Platelet   Comprehensive metabolic panel   Lipid Panel w/o Chol/HDL Ratio   Renal mass    Seeing urology in 2 weeks. Await their input.       Relevant Orders   CBC with Differential/Platelet   Comprehensive metabolic panel   Adrenal mass [facility])    Seeing urology in 2 weeks. Await their input.       Relevant Orders   CBC with Differential/Platelet   Comprehensive metabolic panel    Other Visit Diagnoses    Routine general medical examination at a health care facility    -  Primary   Vaccines up to date. Screening labs checked today. Colonoscopy up to date. Continue diet and exercise. Call with any concerns.     Housing instability       Referral to LCSW made today.   Relevant Orders   Referral to Chronic Care Management Services   Myopia, unspecified laterality       Referral to opthalmology made today.   Relevant Orders   Ambulatory referral to Ophthalmology   Needs flu shot       Flu shot given today.   Relevant Orders   Flu Vaccine QUAD 6+ mos PF IM (Fluarix Quad PF) (Completed)       Discussed aspirin prophylaxis for myocardial infarction prevention and decision was made to continue ASA  LABORATORY TESTING:  Health maintenance labs ordered today as discussed above.   The natural history of prostate cancer and ongoing controversy regarding screening and potential treatment outcomes of prostate cancer has been discussed with the patient. The meaning of a false positive PSA and a false negative PSA has been discussed. He indicates understanding of the limitations of this screening test and wishes to proceed with screening PSA testing.   IMMUNIZATIONS:   - Tdap: Tetanus vaccination status reviewed: last tetanus booster within 10 years. - Influenza: Administered today - Pneumovax: Not applicable - COVID: Up to date  SCREENING: - Colonoscopy: Up to date  Discussed with patient purpose of the colonoscopy is to detect colon cancer at curable precancerous or early stages   PATIENT COUNSELING:    Sexuality: Discussed sexually transmitted diseases, partner selection, use of condoms, avoidance of unintended pregnancy  and contraceptive alternatives.   Advised to avoid cigarette smoking.  I discussed with the patient that most people  either abstain from alcohol or drink within safe limits (<=14/week and <=4 drinks/occasion for males, <=7/weeks and <= 3 drinks/occasion for females) and that the risk for alcohol disorders and other health effects rises proportionally with the number of drinks per week and how often a drinker exceeds daily limits.  Discussed cessation/primary prevention of drug  use and availability of treatment for abuse.   Diet: Encouraged to adjust caloric intake to maintain  or achieve ideal body weight, to reduce intake of dietary saturated fat and total fat, to limit sodium intake by avoiding high sodium foods and not adding table salt, and to maintain adequate dietary potassium and calcium preferably from fresh fruits, vegetables, and low-fat dairy products.    stressed the importance of regular exercise  Injury prevention: Discussed safety belts, safety helmets, smoke detector, smoking near bedding or upholstery.   Dental health: Discussed importance of regular tooth brushing, flossing, and dental visits.   Follow up plan: NEXT PREVENTATIVE PHYSICAL DUE IN 1 YEAR. Return in about 6 weeks (around 02/25/2020) for follow up thyroid.

## 2020-01-15 ENCOUNTER — Telehealth: Payer: Self-pay

## 2020-01-15 LAB — CBC WITH DIFFERENTIAL/PLATELET
Basophils Absolute: 0 10*3/uL (ref 0.0–0.2)
Basos: 0 %
EOS (ABSOLUTE): 0.1 10*3/uL (ref 0.0–0.4)
Eos: 2 %
Hematocrit: 46.5 % (ref 37.5–51.0)
Hemoglobin: 15.6 g/dL (ref 13.0–17.7)
Immature Grans (Abs): 0 10*3/uL (ref 0.0–0.1)
Immature Granulocytes: 0 %
Lymphocytes Absolute: 2.5 10*3/uL (ref 0.7–3.1)
Lymphs: 34 %
MCH: 32.6 pg (ref 26.6–33.0)
MCHC: 33.5 g/dL (ref 31.5–35.7)
MCV: 97 fL (ref 79–97)
Monocytes Absolute: 0.6 10*3/uL (ref 0.1–0.9)
Monocytes: 8 %
Neutrophils Absolute: 4.1 10*3/uL (ref 1.4–7.0)
Neutrophils: 56 %
Platelets: 176 10*3/uL (ref 150–450)
RBC: 4.79 x10E6/uL (ref 4.14–5.80)
RDW: 12.3 % (ref 11.6–15.4)
WBC: 7.3 10*3/uL (ref 3.4–10.8)

## 2020-01-15 LAB — COMPREHENSIVE METABOLIC PANEL
ALT: 25 IU/L (ref 0–44)
AST: 20 IU/L (ref 0–40)
Albumin/Globulin Ratio: 1.6 (ref 1.2–2.2)
Albumin: 4.1 g/dL (ref 3.8–4.8)
Alkaline Phosphatase: 136 IU/L — ABNORMAL HIGH (ref 44–121)
BUN/Creatinine Ratio: 14 (ref 10–24)
BUN: 11 mg/dL (ref 8–27)
Bilirubin Total: 0.5 mg/dL (ref 0.0–1.2)
CO2: 22 mmol/L (ref 20–29)
Calcium: 9.5 mg/dL (ref 8.6–10.2)
Chloride: 104 mmol/L (ref 96–106)
Creatinine, Ser: 0.8 mg/dL (ref 0.76–1.27)
GFR calc Af Amer: 109 mL/min/{1.73_m2} (ref >59)
GFR calc non Af Amer: 94 mL/min/{1.73_m2} (ref >59)
Globulin, Total: 2.5 g/dL (ref 1.5–4.5)
Glucose: 89 mg/dL (ref 65–99)
Potassium: 4.3 mmol/L (ref 3.5–5.2)
Sodium: 138 mmol/L (ref 134–144)
Total Protein: 6.6 g/dL (ref 6.0–8.5)

## 2020-01-15 LAB — LIPID PANEL W/O CHOL/HDL RATIO
Cholesterol, Total: 151 mg/dL (ref 100–199)
HDL: 45 mg/dL (ref >39)
LDL Chol Calc (NIH): 94 mg/dL (ref 0–99)
Triglycerides: 58 mg/dL (ref 0–149)
VLDL Cholesterol Cal: 12 mg/dL (ref 5–40)

## 2020-01-15 LAB — PSA: Prostate Specific Ag, Serum: 5.3 ng/mL — ABNORMAL HIGH (ref 0.0–4.0)

## 2020-01-15 LAB — TSH: TSH: <0.005 u[IU]/mL — ABNORMAL LOW (ref 0.450–4.500)

## 2020-01-15 NOTE — Chronic Care Management (AMB) (Signed)
°  Chronic Care Management   Note  01/15/2020 Name: Tyler Cohen MRN: [id number] DOB: [birth year]  Tyler Cohen is a 64 y.o. year old male who is a primary care patient of Valerie Roys, DO. Tyler Cohen is currently enrolled in care management services. An additional referral for LCSW was placed.   Follow up plan: Telephone appointment with care management team member scheduled for:01/16/2020  Noreene Larsson, [place], [place], [place] [address] Direct Dial: [phone] [email or web address]

## 2020-01-16 ENCOUNTER — Ambulatory Visit: Payer: Medicare PPO | Admitting: Licensed Clinical Social Worker

## 2020-01-16 DIAGNOSIS — Z599 Problem related to housing and economic circumstances, unspecified: Secondary | ICD-10-CM

## 2020-01-16 DIAGNOSIS — Z59819 Housing instability, housed unspecified: Secondary | ICD-10-CM

## 2020-01-16 NOTE — Chronic Care Management (AMB) (Signed)
  Care Management   Follow Up Note   01/16/2020 Name: JONATAN WILSEY MRN: [id number] DOB: December 10, 1955  Referred by: Valerie Roys, DO Reason for referral : Care Coordination   NOBORU BIDINGER is a 64 y.o. year old male who is a primary care patient of Valerie Roys, DO. The care management team was consulted for assistance with care management and care coordination needs.    Review of patient status, including review of consultants reports, relevant laboratory and other test results, and collaboration with appropriate care team members and the patient's provider was performed as part of comprehensive patient evaluation and provision of chronic care management services.    SDOH (Social Determinants of Health) assessments performed: Yes See Care Plan activities for detailed interventions related to SDOH)    Patient is in need of housing resources. He is looking for a place for him and his wife to reside as she is coming out of [facility] because patient is not happy with the care that she has been receiving. LCSW completed brief assessment and made order for C3 support to provide community resource education. Patient reports that they would need housing on ground level as spouse has COPD and is on oxygen. Patient denies any other social work needs other than needing resource education. LCSW will close CCM referral for social work services at this time but will remain available if any future concerns arise.  Eula Fried, BSW, MSW, [facility] Care [email or web address] Phone: [phone]

## 2020-01-17 ENCOUNTER — Telehealth: Payer: Self-pay | Admitting: Family Medicine

## 2020-01-17 NOTE — Telephone Encounter (Signed)
Tyler Cohen 01/17/2020 Called pt regarding community resource referral received. Left message for pt to call me back, my info is [phone] please see ref notes for more details.  [place], Care Management

## 2020-01-21 ENCOUNTER — Telehealth: Payer: Self-pay | Admitting: Family Medicine

## 2020-01-21 ENCOUNTER — Encounter: Payer: Self-pay | Admitting: Family Medicine

## 2020-01-21 NOTE — Telephone Encounter (Signed)
Tyler Cohen 01/21/2020 Called pt regarding community resource referral received. My info is [phone] please see ref notes for more details. Thank you.  [place], Care Management

## 2020-01-29 DIAGNOSIS — E0590 Thyrotoxicosis, unspecified without thyrotoxic crisis or storm: Secondary | ICD-10-CM | POA: Diagnosis not present

## 2020-01-29 DIAGNOSIS — Z7982 Long term (current) use of aspirin: Secondary | ICD-10-CM | POA: Diagnosis not present

## 2020-01-29 DIAGNOSIS — Z720 Tobacco use: Secondary | ICD-10-CM | POA: Diagnosis not present

## 2020-01-29 DIAGNOSIS — J45909 Unspecified asthma, uncomplicated: Secondary | ICD-10-CM | POA: Diagnosis not present

## 2020-01-29 DIAGNOSIS — N400 Enlarged prostate without lower urinary tract symptoms: Secondary | ICD-10-CM | POA: Diagnosis not present

## 2020-01-29 DIAGNOSIS — E785 Hyperlipidemia, unspecified: Secondary | ICD-10-CM | POA: Diagnosis not present

## 2020-01-29 DIAGNOSIS — G3184 Mild cognitive impairment, so stated: Secondary | ICD-10-CM | POA: Diagnosis not present

## 2020-01-29 DIAGNOSIS — K08409 Partial loss of teeth, unspecified cause, unspecified class: Secondary | ICD-10-CM | POA: Diagnosis not present

## 2020-01-29 DIAGNOSIS — R030 Elevated blood-pressure reading, without diagnosis of hypertension: Secondary | ICD-10-CM | POA: Diagnosis not present

## 2020-02-01 ENCOUNTER — Ambulatory Visit: Payer: Medicare Other | Admitting: Urology

## 2020-02-04 ENCOUNTER — Other Ambulatory Visit: Payer: Self-pay | Admitting: Family Medicine

## 2020-02-04 DIAGNOSIS — E0590 Thyrotoxicosis, unspecified without thyrotoxic crisis or storm: Secondary | ICD-10-CM

## 2020-02-04 DIAGNOSIS — R972 Elevated prostate specific antigen [PSA]: Secondary | ICD-10-CM

## 2020-02-04 MED ORDER — METHIMAZOLE 10 MG PO TABS: 10.0000 mg | ORAL_TABLET | Freq: Every day | ORAL | 1 refills | Status: DC

## 2020-02-04 NOTE — Telephone Encounter (Signed)
Please see request for a 90 day supply. Last refill of Methimazole 10 mg tablet on 02/04/20 #30 with 1 refill.

## 2020-02-18 ENCOUNTER — Other Ambulatory Visit: Payer: Self-pay

## 2020-02-18 ENCOUNTER — Ambulatory Visit
Admission: RE | Admit: 2020-02-18 | Discharge: 2020-02-18 | Disposition: A | Discharge status: Home / Self Care | Payer: Medicare PPO | Source: Ambulatory Visit | Attending: Urology | Admitting: Urology

## 2020-02-18 DIAGNOSIS — N2889 Other specified disorders of kidney and ureter: Secondary | ICD-10-CM | POA: Insufficient documentation

## 2020-02-25 ENCOUNTER — Ambulatory Visit (INDEPENDENT_AMBULATORY_CARE_PROVIDER_SITE_OTHER): Payer: Medicare PPO | Admitting: Family Medicine

## 2020-02-25 ENCOUNTER — Encounter: Payer: Self-pay | Admitting: Family Medicine

## 2020-02-25 ENCOUNTER — Other Ambulatory Visit: Payer: Self-pay

## 2020-02-25 VITALS — BP 119/78 | HR 84 | Temp 97.6°F | Wt 153.4 lb

## 2020-02-25 DIAGNOSIS — E0590 Thyrotoxicosis, unspecified without thyrotoxic crisis or storm: Secondary | ICD-10-CM | POA: Diagnosis not present

## 2020-02-25 DIAGNOSIS — R252 Cramp and spasm: Secondary | ICD-10-CM | POA: Diagnosis not present

## 2020-02-25 NOTE — Progress Notes (Signed)
BP 119/78   Pulse 84   Temp 97.6 F (36.4 C)   Wt 153 lb 6.4 oz (69.6 kg)   SpO2 97%   BMI 20.24 kg/m    Subjective:    Patient ID: Tyler Cohen, male    DOB: [birth year], 64 y.o.   MRN: [id number]  HPI: RAJAN BURGARD is a 64 y.o. male  Chief Complaint  Patient presents with  . Hyperthyroidism  . leg cramps    pt states recently he has been having leg cramps in his right leg in his shin area    HYPERTHYROIDISM Thyroid control status:uncontrolled Satisfied with current treatment? yes Medication side effects: no Medication compliance: good compliance  Recent dose adjustment:yes Fatigue: no Cold intolerance: no Heat intolerance: no Weight gain: no Weight loss: no Constipation: no Diarrhea/loose stools: no Palpitations: no Lower extremity edema: no Anxiety/depressed mood: no  LEG CRAMPS Duration: about a week Pain: yes Severity: mild  Quality:  cramping Location:  R side Bilateral:  no Onset: sudden Frequency: leg cramps Time of  day:   night time Sudden unintentional leg jerking:   no Paresthesias:   no Decreased sensation:  no Weakness:   no Insomnia:   no Fatigue:   no Status: fluctuating   Relevant past medical, surgical, family and social history reviewed and updated as indicated. Interim medical history since our last visit reviewed. Allergies and medications reviewed and updated.  Review of Systems  Constitutional: Negative.   Respiratory: Negative.   Cardiovascular: Negative.   Gastrointestinal: Negative.   Musculoskeletal: Positive for myalgias. Negative for arthralgias, back pain, gait problem, joint swelling, neck pain and neck stiffness.  Skin: Negative.   Neurological: Negative.   Psychiatric/Behavioral: Negative.     Per HPI unless specifically indicated above     Objective:    BP 119/78   Pulse 84   Temp 97.6 F (36.4 C)   Wt 153 lb 6.4 oz (69.6 kg)   SpO2 97%   BMI 20.24 kg/m   Wt Readings from Last 3 Encounters:    02/25/20 153 lb 6.4 oz (69.6 kg)  01/14/20 150 lb 12.8 oz (68.4 kg)  01/07/20 165 lb (74.8 kg)    Physical Exam Vitals and nursing note reviewed.  Constitutional:      General: He is not in acute distress.    Appearance: Normal appearance. He is not ill-appearing, toxic-appearing or diaphoretic.  HENT:     Head: Normocephalic and atraumatic.     Right Ear: External ear normal.     Left Ear: External ear normal.     Nose: Nose normal.     Mouth/Throat:     Mouth: Mucous membranes are moist.     Pharynx: Oropharynx is clear.  Eyes:     General: No scleral icterus.       Right eye: No discharge.        Left eye: No discharge.     Extraocular Movements: Extraocular movements intact.     Conjunctiva/sclera: Conjunctivae normal.     Pupils: Pupils are equal, round, and reactive to light.  Cardiovascular:     Rate and Rhythm: Normal rate and regular rhythm.     Pulses: Normal pulses.     Heart sounds: Normal heart sounds. No murmur heard.  No friction rub. No gallop.   Pulmonary:     Effort: Pulmonary effort is normal. No respiratory distress.     Breath sounds: Normal breath sounds. No stridor. No wheezing, rhonchi or rales.  Chest:     Chest wall: No tenderness.  Musculoskeletal:        General: Normal range of motion.     Cervical back: Normal range of motion and neck supple.  Skin:    General: Skin is warm and dry.     Capillary Refill: Capillary refill takes less than 2 seconds.     Coloration: Skin is not jaundiced or pale.     Findings: No bruising, erythema, lesion or rash.  Neurological:     General: No focal deficit present.     Mental Status: He is alert and oriented to person, place, and time. Mental status is at baseline.  Psychiatric:        Mood and Affect: Mood normal.        Behavior: Behavior normal.        Thought Content: Thought content normal.        Judgment: Judgment normal.     Results for orders placed or performed in visit on 01/14/20  CBC  with Differential/Platelet  Result Value Ref Range   WBC 7.3 3.4 - 10.8 x10E3/uL   RBC 4.79 4.14 - 5.80 x10E6/uL   Hemoglobin 15.6 13.0 - 17.7 g/dL   Hematocrit 46.5 37.5 - 51.0 %   MCV 97 79 - 97 fL   MCH 32.6 26.6 - 33.0 pg   MCHC 33.5 31 - 35 g/dL   RDW 12.3 11.6 - 15.4 %   Platelets 176 150 - 450 x10E3/uL   Neutrophils 56 Not Estab. %   Lymphs 34 Not Estab. %   Monocytes 8 Not Estab. %   Eos 2 Not Estab. %   Basos 0 Not Estab. %   Neutrophils Absolute 4.1 1.40 - 7.00 x10E3/uL   Lymphocytes Absolute 2.5 0 - 3 x10E3/uL   Monocytes Absolute 0.6 0 - 0 x10E3/uL   EOS (ABSOLUTE) 0.1 0.0 - 0.4 x10E3/uL   Basophils Absolute 0.0 0 - 0 x10E3/uL   Immature Granulocytes 0 Not Estab. %   Immature Grans (Abs) 0.0 0.0 - 0.1 x10E3/uL  Comprehensive metabolic panel  Result Value Ref Range   Glucose 89 65 - 99 mg/dL   BUN 11 8 - 27 mg/dL   Creatinine, Ser 0.80 0.76 - 1.27 mg/dL   GFR calc non Af Amer 94 >59 mL/min/1.73   GFR calc Af Amer 109 >59 mL/min/1.73   BUN/Creatinine Ratio 14 10 - 24   Sodium 138 134 - 144 mmol/L   Potassium 4.3 3.5 - 5.2 mmol/L   Chloride 104 96 - 106 mmol/L   CO2 22 20 - 29 mmol/L   Calcium 9.5 8.6 - 10.2 mg/dL   Total Protein 6.6 6.0 - 8.5 g/dL   Albumin 4.1 3.8 - 4.8 g/dL   Globulin, Total 2.5 1.5 - 4.5 g/dL   Albumin/Globulin Ratio 1.6 1.2 - 2.2   Bilirubin Total 0.5 0.0 - 1.2 mg/dL   Alkaline Phosphatase 136 (H) 44 - 121 IU/L   AST 20 0 - 40 IU/L   ALT 25 0 - 44 IU/L  Lipid Panel w/o Chol/HDL Ratio  Result Value Ref Range   Cholesterol, Total 151 100 - 199 mg/dL   Triglycerides 58 0 - 149 mg/dL   HDL 45 >39 mg/dL   VLDL Cholesterol Cal 12 5 - 40 mg/dL   LDL Chol Calc (NIH) 94 0 - 99 mg/dL  PSA  Result Value Ref Range   Prostate Specific Ag, Serum 5.3 (H) 0.0 - 4.0 ng/mL  TSH  Result Value Ref Range   TSH <0.005 (L) 0.450 - 4.500 uIU/mL  Urinalysis, Routine w reflex microscopic  Result Value Ref Range   Specific Gravity, UA 1.010 1.005 - 1.030    pH, UA 6.5 5.0 - 7.5   Color, UA Yellow Yellow   Appearance Ur Clear Clear   Leukocytes,UA Negative Negative   Protein,UA Negative Negative/Trace   Glucose, UA Negative Negative   Ketones, UA Negative Negative   RBC, UA Negative Negative   Bilirubin, UA Negative Negative   Urobilinogen, Ur 0.2 0.2 - 1.0 mg/dL   Nitrite, UA Negative Negative      Assessment & Plan:   Problem List Items Addressed This Visit      Endocrine   Hyperthyroidism - Primary    Tolerating his methimazole well. Will check labs today and adjust meds as needed. Call with any concerns. Continue to monitor.       Relevant Orders   TSH    Other Visit Diagnoses    Leg cramps       Will check labs today. Await results. Increase fluids. Call with any concerns.    Relevant Orders   Basic metabolic panel   Magnesium   Phosphorus       Follow up plan: Return in about 6 weeks (around 04/07/2020).   >30 minutes spent today with patient.

## 2020-02-25 NOTE — Assessment & Plan Note (Signed)
Tolerating his methimazole well. Will check labs today and adjust meds as needed. Call with any concerns. Continue to monitor.

## 2020-02-26 ENCOUNTER — Other Ambulatory Visit: Payer: Self-pay | Admitting: Family Medicine

## 2020-02-26 LAB — BASIC METABOLIC PANEL
BUN/Creatinine Ratio: 14 (ref 10–24)
BUN: 13 mg/dL (ref 8–27)
CO2: 20 mmol/L (ref 20–29)
Calcium: 9.1 mg/dL (ref 8.6–10.2)
Chloride: 106 mmol/L (ref 96–106)
Creatinine, Ser: 0.9 mg/dL (ref 0.76–1.27)
GFR calc Af Amer: 104 mL/min/{1.73_m2} (ref >59)
GFR calc non Af Amer: 90 mL/min/{1.73_m2} (ref >59)
Glucose: 95 mg/dL (ref 65–99)
Potassium: 4.3 mmol/L (ref 3.5–5.2)
Sodium: 139 mmol/L (ref 134–144)

## 2020-02-26 LAB — MAGNESIUM: Magnesium: 1.7 mg/dL (ref 1.6–2.3)

## 2020-02-26 LAB — TSH: TSH: <0.005 u[IU]/mL — ABNORMAL LOW (ref 0.450–4.500)

## 2020-02-26 LAB — PHOSPHORUS: Phosphorus: 3 mg/dL (ref 2.8–4.1)

## 2020-02-26 MED ORDER — METHIMAZOLE 10 MG PO TABS: 10.0000 mg | ORAL_TABLET | Freq: Two times a day (BID) | ORAL | 1 refills | Status: DC

## 2020-03-04 ENCOUNTER — Telehealth: Payer: Self-pay | Admitting: General Practice

## 2020-03-04 ENCOUNTER — Ambulatory Visit: Payer: Self-pay | Admitting: General Practice

## 2020-03-04 DIAGNOSIS — E7800 Pure hypercholesterolemia, unspecified: Secondary | ICD-10-CM

## 2020-03-04 DIAGNOSIS — Z5900 Homelessness unspecified: Secondary | ICD-10-CM

## 2020-03-04 DIAGNOSIS — Z59819 Housing instability, housed unspecified: Secondary | ICD-10-CM

## 2020-03-04 DIAGNOSIS — E0590 Thyrotoxicosis, unspecified without thyrotoxic crisis or storm: Secondary | ICD-10-CM

## 2020-03-04 DIAGNOSIS — Z599 Problem related to housing and economic circumstances, unspecified: Secondary | ICD-10-CM

## 2020-03-04 DIAGNOSIS — Z720 Tobacco use: Secondary | ICD-10-CM

## 2020-03-04 NOTE — Chronic Care Management (AMB) (Signed)
Chronic Care Management   Follow Up Note   03/04/2020 Name: Tyler Cohen MRN: [id number] DOB: [birth year]  Referred by: Valerie Roys, DO Reason for referral : Chronic Care Management (RNCM Chronic Disease Management and Care Coordination Needs)   TALEN POSER is a 65 y.o. year old male who is a primary care patient of Valerie Roys, DO. The CCM team was consulted for assistance with chronic disease management and care coordination needs.    Review of patient status, including review of consultants reports, relevant laboratory and other test results, and collaboration with appropriate care team members and the patient's provider was performed as part of comprehensive patient evaluation and provision of chronic care management services.    SDOH (Social Determinants of Health) assessments performed: Yes See Care Plan activities for detailed interventions related to [facility])    New need for transportation. Care guide referral placed.   Outpatient Encounter Medications as of 03/04/2020  Medication Sig  . albuterol (VENTOLIN HFA) 108 (90 Base) MCG/ACT inhaler Inhale 2 puffs into the lungs every 6 (six) hours as needed for wheezing or shortness of breath.  Marland Kitchen aspirin 81 MG tablet Take 81 mg by mouth daily.  Marland Kitchen atorvastatin (LIPITOR) 20 MG tablet TAKE 1 TABLET(20 MG) BY MOUTH AT BEDTIME  . b complex vitamins tablet Take 1 tablet by mouth daily.  . calcipotriene (DOVONOX) 0.005 % cream Apply topically 2 (two) times daily.  . diflorasone (PSORCON) 0.05 % ointment as needed.   Marland Kitchen econazole nitrate 1 % cream as needed.   . methimazole (TAPAZOLE) 10 MG tablet Take 1 tablet (10 mg total) by mouth 2 (two) times daily.  Marland Kitchen propylthiouracil (PTU) 50 MG tablet Take by mouth 2 (two) times daily.   . tamsulosin (FLOMAX) 0.4 MG CAPS capsule TAKE 1 CAPSULE(0.4 MG) BY MOUTH DAILY   No facility-administered encounter medications on file as of 03/04/2020.     Objective:  BP Readings from Last 3  Encounters:  02/25/20 119/78  01/14/20 104/69  09/25/19 121/67    Goals Addressed              This Visit's Progress   .  RNCM-pt"I try to eat healthy" (pt-stated)        CARE PLAN ENTRY (see longtitudinal plan of care for additional care plan information)  Current Barriers:  . Chronic Disease Management support, education, and care coordination needs related to HLD and Hyperthyroidism   Clinical Goal(s) related to HLD and Hyperthyroidism  :  Over the next 120 days, patient will:  . Work with the care management team to address educational, disease management, and care coordination needs  . Begin or continue self health monitoring activities as directed today  adhere to a heart healthy diet . Call provider office for new or worsened signs and symptoms Oxygen saturation lower than established parameter, Shortness of breath, and New or worsened symptom related to HLD, Hyperthyroidism and other chronic conditions  . Call care management team with questions or concerns . Verbalize basic understanding of patient centered plan of care established today  Interventions related to HLD and Hyperthyroidism :  . Evaluation of current treatment plans and patient's adherence to plan as established by provider.  03-04-2020: the patient states he saw the pcp on 02-25-2020 and is now taking the medication as prescribed with the new change.  Will follow up again in January with the pcp. He states his restless legs are much better and are not currently giving him any  issues.  He is eating well and taking his medications. Biggest concern is housing for him and his wife and new issue of transportation due to his car not running.  . Assessed patient understanding of disease states. 03-04-2020: Review of chronic conditions. The patient states he is doing well taking care of himself. He is watching what he eats, trying to stop smoking, still looking for stable housing for him and his wife, and exercising more  by riding his bike. Praised the patient for positive change. The patient states he has adjusted well to the change in his tyroid medications.  . Assessed patient's education and care coordination needs.  Care guide referral for help with housing needs. The patient is currently living with a cousin and his wife is in a facility. He has a new phone number: [phone].  Information provided for the CCM team and for the care guides reaching out to him to help with housing needs. 12-18-2019: The patient has received the information on housing and has called the numbers. He is waiting for call backs. The patient says his wife is ready to come home but she can not until they find a place to live. The patient has been working with the care guides. 03-04-2020: New referral for care guides placed today. The patient states his car is not running and he needs alternate transportation resources at this time. Education and support given.  . Provided disease specific education to patient. Review of heart healthy diet. The patient verbalized he tries to eat healthy. 03-04-2020:  The patient endorses following a heart healthy diet. He has cut back on salt and fats in his diet. Endorses eating well.  . Evaluation of exercise regimen. The patient does not do any structured exercise but states he does mow yards. 12-18-2019: The patient is riding his bike 2 to 4 times a week for over an hour. This is helping him feel better and he plans on increasing his riding. He does this when it is cooler at night. 03-04-2020: Is not riding his bike as much with the cooler weather but still riding when he can.  Nash Dimmer with appropriate clinical care team members regarding patient needs.  Denies any needs from the LCSW and pharmacist. Will update the team that he has a new number. Also will let them know about care guide referral for housing. . Evaluation of next appointment. The patient sees pcp 04-09-2020 at 10:20 am. Next appointment  [facility] 04-23-2020 at 10:15 am.  Has updated care team information to call if needed sooner.  . Evaluation of smoking cessation. The patient smokes 4 packs of cigars a day. There are 2 in a pack. He has cut back considerably and will call for help with stopping. Praised patient for accomplishments and will continue to monitor for needs.   Patient Self Care Activities related to HLD and Hyperthyroidism :  . Patient is unable to independently self-manage chronic health conditions  Please see past updates related to this goal by clicking on the "Past Updates" button in the selected goal           Plan:   Telephone follow up appointment with care management team member scheduled for: 04-23-2020 at 10:15 am   [place], MSN, [facility] Mobile: [phone]

## 2020-03-04 NOTE — Patient Instructions (Signed)
Visit Information  Goals Addressed              This Visit's Progress   .  RNCM-pt"I try to eat healthy" (pt-stated)        CARE PLAN ENTRY (see longtitudinal plan of care for additional care plan information)  Current Barriers:  . Chronic Disease Management support, education, and care coordination needs related to HLD and Hyperthyroidism   Clinical Goal(s) related to HLD and Hyperthyroidism  :  Over the next 120 days, patient will:  . Work with the care management team to address educational, disease management, and care coordination needs  . Begin or continue self health monitoring activities as directed today  adhere to a heart healthy diet . Call provider office for new or worsened signs and symptoms Oxygen saturation lower than established parameter, Shortness of breath, and New or worsened symptom related to HLD, Hyperthyroidism and other chronic conditions  . Call care management team with questions or concerns . Verbalize basic understanding of patient centered plan of care established today  Interventions related to HLD and Hyperthyroidism :  . Evaluation of current treatment plans and patient's adherence to plan as established by provider.  03-04-2020: the patient states he saw the pcp on 02-25-2020 and is now taking the medication as prescribed with the new change.  Will follow up again in January with the pcp. He states his restless legs are much better and are not currently giving him any issues.  He is eating well and taking his medications. Biggest concern is housing for him and his wife and new issue of transportation due to his car not running.  . Assessed patient understanding of disease states. 03-04-2020: Review of chronic conditions. The patient states he is doing well taking care of himself. He is watching what he eats, trying to stop smoking, still looking for stable housing for him and his wife, and exercising more by riding his bike. Praised the patient for  positive change. The patient states he has adjusted well to the change in his tyroid medications.  . Assessed patient's education and care coordination needs.  Care guide referral for help with housing needs. The patient is currently living with a cousin and his wife is in a facility. He has a new phone number: [phone].  Information provided for the CCM team and for the care guides reaching out to him to help with housing needs. 12-18-2019: The patient has received the information on housing and has called the numbers. He is waiting for call backs. The patient says his wife is ready to come home but she can not until they find a place to live. The patient has been working with the care guides. 03-04-2020: New referral for care guides placed today. The patient states his car is not running and he needs alternate transportation resources at this time. Education and support given.  . Provided disease specific education to patient. Review of heart healthy diet. The patient verbalized he tries to eat healthy. 03-04-2020:  The patient endorses following a heart healthy diet. He has cut back on salt and fats in his diet. Endorses eating well.  . Evaluation of exercise regimen. The patient does not do any structured exercise but states he does mow yards. 12-18-2019: The patient is riding his bike 2 to 4 times a week for over an hour. This is helping him feel better and he plans on increasing his riding. He does this when it is cooler at night. 03-04-2020: Is  not riding his bike as much with the cooler weather but still riding when he can.  Nash Dimmer with appropriate clinical care team members regarding patient needs.  Denies any needs from the LCSW and pharmacist. Will update the team that he has a new number. Also will let them know about care guide referral for housing. . Evaluation of next appointment. The patient sees pcp 04-09-2020 at 10:20 am. Next appointment with [facility] 04-23-2020 at 10:15 am.  Has updated  care team information to call if needed sooner.  . Evaluation of smoking cessation. The patient smokes 4 packs of cigars a day. There are 2 in a pack. He has cut back considerably and will call for help with stopping. Praised patient for accomplishments and will continue to monitor for needs.   Patient Self Care Activities related to HLD and Hyperthyroidism :  . Patient is unable to independently self-manage chronic health conditions  Please see past updates related to this goal by clicking on the "Past Updates" button in the selected goal         The patient verbalized understanding of instructions, educational materials, and care plan provided today and declined offer to receive copy of patient instructions, educational materials, and care plan.   Telephone follow up appointment with care management team member scheduled for: 04-23-2020 at 10:15 am  [place], MSN, [facility] Mobile: [phone]

## 2020-03-06 ENCOUNTER — Telehealth: Payer: Self-pay | Admitting: Family Medicine

## 2020-03-06 ENCOUNTER — Ambulatory Visit: Payer: Medicare Other | Admitting: Urology

## 2020-03-06 NOTE — Telephone Encounter (Signed)
   SF 03/06/2020   Name: Tyler Cohen   MRN: [id number]   DOB: [birth year]   AGE: 64 y.o.   GENDER: male   PCP Park Liter P, DO.    Called Tyler Cohen regarding [employer] Referral for transportation. Patient did not answer, could not leave a message due to voicemail not being set up. Will try to contact patient at a later time.   [place], Care Management Phone: [phone] Email: [email or web address]

## 2020-03-07 DIAGNOSIS — H2513 Age-related nuclear cataract, bilateral: Secondary | ICD-10-CM | POA: Diagnosis not present

## 2020-03-07 DIAGNOSIS — D3131 Benign neoplasm of right choroid: Secondary | ICD-10-CM | POA: Diagnosis not present

## 2020-03-13 ENCOUNTER — Ambulatory Visit (INDEPENDENT_AMBULATORY_CARE_PROVIDER_SITE_OTHER): Payer: Medicare PPO | Admitting: Urology

## 2020-03-13 ENCOUNTER — Other Ambulatory Visit: Payer: Self-pay

## 2020-03-13 ENCOUNTER — Encounter: Payer: Self-pay | Admitting: Urology

## 2020-03-13 VITALS — BP 125/75 | HR 87 | Ht 73.0 in | Wt 153.0 lb

## 2020-03-13 DIAGNOSIS — N2889 Other specified disorders of kidney and ureter: Secondary | ICD-10-CM | POA: Diagnosis not present

## 2020-03-13 NOTE — Progress Notes (Signed)
03/13/2020 1:04 PM   Tyler Cohen 12-14-1955 [id number]  Referring provider: Valerie Roys, DO [place],  [place] [address]  Chief Complaint  Patient presents with  . Other    Urologic history: 1.Small left renal mass -Incidentally identified MRI September 2018 -Elected surveillance   HPI: 64 y.o. male presents for renal mass follow-up.   13 mm left upper pole enhancing lesion on MRI 2018  No significant change on serial imaging  Denies flank, abdominal or pelvic pain  No dysuria or gross hematuria  No bothersome LUTS; remains on tamsulosin  RUS performed 02/18/2020 shows stable 13 x 11 x 13 cm left renal mass   PMH: Past Medical History:  Diagnosis Date  . Arthritis   . Heart murmur   . Hyperlipidemia   . Macrocytosis   . Thyroid disease   . Tobacco abuse     Surgical History: Past Surgical History:  Procedure Laterality Date  . COLONOSCOPY WITH PROPOFOL N/A 12/01/2015   Procedure: COLONOSCOPY WITH PROPOFOL;  Surgeon: Lucilla Lame, MD;  Location: [place];  Service: Endoscopy;  Laterality: N/A;  . POLYPECTOMY  12/01/2015   Procedure: POLYPECTOMY;  Surgeon: Lucilla Lame, MD;  Location: [place];  Service: Endoscopy;;    Home Medications:  Allergies as of 03/13/2020   No Known Allergies     Medication List       Accurate as of March 13, 2020  1:04 PM. If you have any questions, ask your nurse or doctor.        albuterol 108 (90 Base) MCG/ACT inhaler Commonly known as: VENTOLIN HFA Inhale 2 puffs into the lungs every 6 (six) hours as needed for wheezing or shortness of breath.   aspirin 81 MG tablet Take 81 mg by mouth daily.   atorvastatin 20 MG tablet Commonly known as: LIPITOR TAKE 1 TABLET(20 MG) BY MOUTH AT BEDTIME   b complex vitamins tablet Take 1 tablet by mouth daily.   calcipotriene 0.005 % cream Commonly known as: DOVONOX Apply topically 2 (two) times daily.    diflorasone 0.05 % ointment Commonly known as: PSORCON as needed.   econazole nitrate 1 % cream as needed.   methimazole 10 MG tablet Commonly known as: TAPAZOLE Take 1 tablet (10 mg total) by mouth 2 (two) times daily.   propylthiouracil 50 MG tablet Commonly known as: PTU Take by mouth 2 (two) times daily.   tamsulosin 0.4 MG Caps capsule Commonly known as: FLOMAX TAKE 1 CAPSULE(0.4 MG) BY MOUTH DAILY       Allergies: No Known Allergies  Family History: Family History  Problem Relation Age of Onset  . Hypertension Mother   . Diabetes Mother        lost both legs  . Kidney disease Mother   . Hypertension Father   . Emphysema Father   . Sickle cell trait Father   . Hypertension Brother   . Hyperlipidemia Brother   . Sickle cell trait Sister     Social History:  reports that he has been smoking cigars. He has a 10.50 pack-year smoking history. He has never used smokeless tobacco. He reports current drug use. Drug: Marijuana. He reports that he does not drink alcohol.   Physical Exam: BP 125/75   Pulse 87   Ht 6\' 1"  (1.854 m)   Wt 153 lb (69.4 kg)   BMI 20.19 kg/m   Constitutional:  Alert and oriented, No acute distress. HEENT: [place] AT, moist mucus  membranes.  Trachea midline, no masses. Cardiovascular: No clubbing, cyanosis, or edema. Respiratory: Normal respiratory effort, no increased work of breathing. Psychiatric: Normal mood and affect.   Assessment & Plan:    1.  Left renal mass  13 mm left renal mass notified in 2018 with stable size  We again discussed the likelihood of a small renal cell carcinoma and that surveillance is an acceptable option if there is no significant change in size  We again reviewed other options including renal mass biopsy, percutaneous cryoablation and laparoscopic excision  He desires to continue surveillance and will schedule a surveillance MRI 1 year   Abbie Sons, MD  [facility] [address], [place], [place] [address] [phone]

## 2020-03-14 NOTE — Telephone Encounter (Signed)
   SF 03/14/2020   Name: Tyler Cohen   MRN: [id number]   DOB: 1955-10-23   AGE: 64 y.o.   GENDER: male   PCP Park Liter P, DO.    Spoke with Tyler Cohen today regarding referral for transportation. Patient stated that he has [facility] and he will give them a call to discuss if he still has the transportation benefit. Care Guide will research resources for transportation within [facility] and give patient a call back within the week.   [place], Care Management Phone: [phone] Email: [email or web address]

## 2020-03-17 NOTE — Telephone Encounter (Signed)
   SF 03/17/2020   Name: Tyler Cohen   MRN: [id number]   DOB: [birth year]   AGE: 64 y.o.   GENDER: male   PCP Park Liter P, DO.    Called patient today regarding referral for transportation. Patient did not answer. Could not leave voicemail due to no voicemail set up. Care Guide will try to give patient a call within a few days. Will send patient resource letter with list of transportation resources for [facility].   [place], Care Management Phone: [phone] Email: [email or web address]

## 2020-03-19 ENCOUNTER — Encounter: Payer: Self-pay | Admitting: Family Medicine

## 2020-03-19 NOTE — Telephone Encounter (Signed)
° °  SF 03/19/2020    Name: Tyler Cohen    MRN: [id number]    DOB: June 11, 1955    AGE: 64 y.o.    GENDER: male    PCP Park Liter P, DO.  Referral Reason: Transportation Needs   Interventions: Successful outbound call placed to the patient Discussed resources to assist with transportation. Resource letter has been sent to patient for transportation in [place]: [place] and [employer].   Follow up plan: No further follow up planned at this time. The patient has been provided with needed resources.    [place], Care Management Phone: [phone] Email: [email or web address]

## 2020-04-06 ENCOUNTER — Other Ambulatory Visit: Payer: Self-pay | Admitting: Family Medicine

## 2020-04-09 ENCOUNTER — Encounter: Payer: Self-pay | Admitting: Family Medicine

## 2020-04-09 ENCOUNTER — Other Ambulatory Visit: Payer: Self-pay

## 2020-04-09 ENCOUNTER — Ambulatory Visit (INDEPENDENT_AMBULATORY_CARE_PROVIDER_SITE_OTHER): Payer: Medicare HMO | Admitting: Family Medicine

## 2020-04-09 VITALS — BP 100/63 | HR 88 | Temp 98.1°F | Wt 161.0 lb

## 2020-04-09 DIAGNOSIS — R972 Elevated prostate specific antigen [PSA]: Secondary | ICD-10-CM

## 2020-04-09 DIAGNOSIS — E0590 Thyrotoxicosis, unspecified without thyrotoxic crisis or storm: Secondary | ICD-10-CM

## 2020-04-09 MED ORDER — LIDOCAINE 5 % EX OINT: 1.0000 "application " | TOPICAL_OINTMENT | CUTANEOUS | 0 refills | Status: DC | PRN

## 2020-04-09 MED ORDER — CLOBETASOL PROPIONATE 0.05 % EX OINT: 1.0000 "application " | TOPICAL_OINTMENT | Freq: Two times a day (BID) | CUTANEOUS | 0 refills | Status: DC

## 2020-04-09 MED ORDER — CLOTRIMAZOLE-BETAMETHASONE 1-0.05 % EX CREA: 1.0000 "application " | TOPICAL_CREAM | Freq: Two times a day (BID) | CUTANEOUS | 0 refills | Status: DC

## 2020-04-09 NOTE — Progress Notes (Signed)
BP 100/63   Pulse 88   Temp 98.1 F (36.7 C)   Wt 161 lb (73 kg)   SpO2 98%   BMI 21.24 kg/m    Subjective:    Patient ID: Tyler Cohen, male    DOB: February 29, 1956, 65 y.o.   MRN: [id number]  HPI: Tyler Cohen is a 65 y.o. male  Chief Complaint  Patient presents with  . Hyperthyroidism   HYPERTHYROIDISM Thyroid control status:better Satisfied with current treatment? yes Medication side effects: no Medication compliance: good compliance Recent dose adjustment:yes Fatigue: no Cold intolerance: no Heat intolerance: no Weight gain: no Weight loss: no Constipation: no Diarrhea/loose stools: no Palpitations: no Lower extremity edema: no Anxiety/depressed mood: no  Relevant past medical, surgical, family and social history reviewed and updated as indicated. Interim medical history since our last visit reviewed. Allergies and medications reviewed and updated.  Review of Systems  Constitutional: Negative.   Respiratory: Negative.   Cardiovascular: Negative.   Gastrointestinal: Negative.   Musculoskeletal: Negative.   Psychiatric/Behavioral: Negative.     Per HPI unless specifically indicated above     Objective:    BP 100/63   Pulse 88   Temp 98.1 F (36.7 C)   Wt 161 lb (73 kg)   SpO2 98%   BMI 21.24 kg/m   Wt Readings from Last 3 Encounters:  04/09/20 161 lb (73 kg)  03/13/20 153 lb (69.4 kg)  02/25/20 153 lb 6.4 oz (69.6 kg)    Physical Exam Vitals and nursing note reviewed.  Constitutional:      General: He is not in acute distress.    Appearance: Normal appearance. He is not ill-appearing, toxic-appearing or diaphoretic.  HENT:     Head: Normocephalic and atraumatic.     Right Ear: External ear normal.     Left Ear: External ear normal.     Nose: Nose normal.     Mouth/Throat:     Mouth: Mucous membranes are moist.     Pharynx: Oropharynx is clear.  Eyes:     General: No scleral icterus.       Right eye: No discharge.        Left  eye: No discharge.     Extraocular Movements: Extraocular movements intact.     Conjunctiva/sclera: Conjunctivae normal.     Pupils: Pupils are equal, round, and reactive to light.  Cardiovascular:     Rate and Rhythm: Normal rate and regular rhythm.     Pulses: Normal pulses.     Heart sounds: Normal heart sounds. No murmur heard. No friction rub. No gallop.   Pulmonary:     Effort: Pulmonary effort is normal. No respiratory distress.     Breath sounds: Normal breath sounds. No stridor. No wheezing, rhonchi or rales.  Chest:     Chest wall: No tenderness.  Musculoskeletal:        General: Normal range of motion.     Cervical back: Normal range of motion and neck supple.  Skin:    General: Skin is warm and dry.     Capillary Refill: Capillary refill takes less than 2 seconds.     Coloration: Skin is not jaundiced or pale.     Findings: No bruising, erythema, lesion or rash.  Neurological:     General: No focal deficit present.     Mental Status: He is alert and oriented to person, place, and time. Mental status is at baseline.  Psychiatric:  Mood and Affect: Mood normal.        Behavior: Behavior normal.        Thought Content: Thought content normal.        Judgment: Judgment normal.     Results for orders placed or performed in visit on 02/25/20  TSH  Result Value Ref Range   TSH <0.005 (L) 0.450 - 4.500 uIU/mL  Basic metabolic panel  Result Value Ref Range   Glucose 95 65 - 99 mg/dL   BUN 13 8 - 27 mg/dL   Creatinine, Ser 8.11 0.76 - 1.27 mg/dL   GFR calc non Af Amer 90 >59 mL/min/1.73   GFR calc Af Amer 104 >59 mL/min/1.73   BUN/Creatinine Ratio 14 10 - 24   Sodium 139 134 - 144 mmol/L   Potassium 4.3 3.5 - 5.2 mmol/L   Chloride 106 96 - 106 mmol/L   CO2 20 20 - 29 mmol/L   Calcium 9.1 8.6 - 10.2 mg/dL  Magnesium  Result Value Ref Range   Magnesium 1.7 1.6 - 2.3 mg/dL  Phosphorus  Result Value Ref Range   Phosphorus 3.0 2.8 - 4.1 mg/dL       Assessment & Plan:   Problem List Items Addressed This Visit      Endocrine   Hyperthyroidism - Primary    Tolerating methimazole well. Weight up. Rechecking labs today. Await results. Treat as needed.       Relevant Orders   Thyroid Panel With TSH    Other Visit Diagnoses    Elevated PSA       Rechecking labs today. Await results.        Follow up plan: Return in about 6 weeks (around 05/21/2020).

## 2020-04-09 NOTE — Assessment & Plan Note (Signed)
Tolerating methimazole well. Weight up. Rechecking labs today. Await results. Treat as needed.

## 2020-04-10 LAB — THYROID PANEL WITH TSH
T3 Uptake Ratio: <2 % — ABNORMAL LOW (ref 24–39)
T4, Total: 5.5 ug/dL (ref 4.5–12.0)
TSH: <0.005 u[IU]/mL — ABNORMAL LOW (ref 0.450–4.500)

## 2020-04-10 LAB — PSA: Prostate Specific Ag, Serum: 4.8 ng/mL — ABNORMAL HIGH (ref 0.0–4.0)

## 2020-04-14 ENCOUNTER — Other Ambulatory Visit: Payer: Self-pay | Admitting: Family Medicine

## 2020-04-14 MED ORDER — METHIMAZOLE 10 MG PO TABS: 20.0000 mg | ORAL_TABLET | Freq: Two times a day (BID) | ORAL | 1 refills | Status: DC

## 2020-04-18 ENCOUNTER — Telehealth: Payer: Self-pay

## 2020-04-18 NOTE — Telephone Encounter (Signed)
PA started for Lidocaine 5% ointment through cover my meds, waiting on determination

## 2020-04-23 ENCOUNTER — Telehealth: Payer: Self-pay | Admitting: General Practice

## 2020-04-23 ENCOUNTER — Ambulatory Visit: Payer: Self-pay | Admitting: General Practice

## 2020-04-23 DIAGNOSIS — Z5900 Homelessness unspecified: Secondary | ICD-10-CM

## 2020-04-23 DIAGNOSIS — E7800 Pure hypercholesterolemia, unspecified: Secondary | ICD-10-CM

## 2020-04-23 DIAGNOSIS — I10 Essential (primary) hypertension: Secondary | ICD-10-CM

## 2020-04-23 NOTE — Patient Instructions (Signed)
Visit Information  Patient Care Plan: RNCM: HLD Management    Problem Identified: RNCM: Management of HLD   Priority: Medium    Goal: RNCM: HLD Management   Priority: Medium  Note:   Current Barriers:  . Poorly controlled hyperlipidemia, complicated by smoking, homelessness, HTN . Current antihyperlipidemic regimen: Lipitor 20 mg  . Most recent lipid panel:     Component Value Date/Time   CHOL 151 01/14/2020 1050   TRIG 58 01/14/2020 1050   HDL 45 01/14/2020 1050   CHOLHDL 3.4 11/08/2017 1118   LDLCALC 94 01/14/2020 1050 .   Marland Kitchen ASCVD risk enhancing conditions: age 65, HTN, current smoker . Unable to independently manage HLD . Lacks social connections . Does not maintain contact with provider office . Does not contact provider office for questions/concerns . Homeless  RN Care Manager Clinical Goal(s):  Marland Kitchen Over the next 120 days, patient will work with Consulting civil engineer, providers, and care team towards execution of optimized self-health management plan . Over the next 120 days, patient will verbalize understanding of plan for HLD management  . Over the next 120 days, patient will work with [place], and pcp to address needs related to effective management of HLD  . Over the next 120 days, patient will attend all scheduled medical appointments: 05-21-2020 at 0920 am . Over the next 120 days, patient will work with care guides and CCM team  (community agency) to assist with additional housing and transportation needs or new SDOH needs   Interventions: . Collaboration with Valerie Roys, DO regarding development and update of comprehensive plan of care as evidenced by provider attestation and co-signature . Inter-disciplinary care team collaboration (see longitudinal plan of care) . Medication review performed; medication list updated in electronic medical record.  Bertram Savin care team collaboration (see longitudinal plan of care) . Referred to pharmacy team for assistance  with HLD medication management . Evaluation of current treatment plan related to HLD and patient's adherence to plan as established by provider. . Advised patient to call the office for changes in condition or new questions . Provided education to patient re: heart healthy diet, keeping appointments, calling for resources as needed  . Discussed plans with patient for ongoing care management follow up and provided patient with direct contact information for care management team . Provided patient and/or caregiver with housing/transportation  information about resources in [employer]. The patient has transportation information if needed. Is working on finding secure housing for him and his spouse (spouse currently in SNF) (Data processing manager). . Reviewed scheduled/upcoming provider appointments including: 05-21-2020 at 0920 am   Patient Goals/Self-Care Activities: . Over the next 120 days, patient will:   - call for medicine refill 2 or 3 days before it runs out - call if I am sick and can't take my medicine - keep a list of all the medicines I take; vitamins and herbals too - learn to read medicine labels - use a pillbox to sort medicine - use an alarm clock or phone to remind me to take my medicine - change to whole grain breads, cereal, pasta - drink 6 to 8 glasses of water each day - eat 5 or 6 small meals each day - fill half the plate with nonstarchy vegetables - keep a food diary - limit fast food meals to no more than 1 per week - manage portion size - prepare main meal at home 3 to 5 days each week - be open to making changes -  I can manage, know and watch for signs of a heart attack - if I have chest pain, call for help - learn about small changes that will make a big difference - learn my personal risk factors - education plan reviewed and/or amended - empathy and reassurance conveyed - family/caregiver participation in learning encouraged - health literacy screen  reviewed - patient's preferred learning methods utilized - questions encouraged - readiness to learn monitored   Follow Up Plan: Telephone follow up appointment with care management team member scheduled for: 06-18-2020 at 1030     Task: RNCM: HLD management   Note:   Care Management Activities:    - education plan reviewed and/or amended - empathy and reassurance conveyed - family/caregiver participation in learning encouraged - health literacy screen reviewed - patient's preferred learning methods utilized - questions encouraged - readiness to learn monitored       Patient Care Plan: RNCM: Hypertension (Adult)    Problem Identified: RNCM: Hypertension (Hypertension)   Priority: Medium    Goal: RNCM: Hypertension Monitored   Priority: Medium  Note:   Objective:  . Last practice recorded BP readings:  BP Readings from Last 3 Encounters:  04/09/20 100/63  03/13/20 125/75  02/25/20 119/78 .   Marland Kitchen Most recent eGFR/CrCl: No results found for: EGFR  No components found for: CRCL Current Barriers:  Marland Kitchen Knowledge Deficits related to basic understanding of hypertension pathophysiology and self care management . Knowledge Deficits related to understanding of medications prescribed for management of hypertension . Transportation barriers . Limited Social Support . Film/video editor.  . Unable to independently manage HTN . Lacks social connections . Does not maintain contact with provider office . Does not contact provider office for questions/concerns Case Manager Clinical Goal(s):  Marland Kitchen Over the next 120 days, patient will verbalize understanding of plan for hypertension management . Over the next 120 days, patient will attend all scheduled medical appointments: 05-21-2020 at 0920 pm . Over the next 120 days, patient will demonstrate improved adherence to prescribed treatment plan for hypertension as evidenced by taking all medications as prescribed, monitoring and recording  blood pressure as directed, adhering to low sodium/DASH diet . Over the next 120 days, patient will demonstrate improved health management independence as evidenced by checking blood pressure as directed and notifying PCP if SBP>160 or DBP > 90, taking all medications as prescribe, and adhering to a low sodium diet as discussed. . Over the next 120 days, patient will verbalize basic understanding of hypertension disease process and self health management plan as evidenced by taking medications as directed, keeping appointments and working with the CCM team to optimize health and well being  Interventions:  . Collaboration with Valerie Roys, DO regarding development and update of comprehensive plan of care as evidenced by provider attestation and co-signature . Inter-disciplinary care team collaboration (see longitudinal plan of care) . Evaluation of current treatment plan related to hypertension self management and patient's adherence to plan as established by provider. . Provided education to patient re: stroke prevention, s/s of heart attack and stroke, DASH diet, complications of uncontrolled blood pressure . Reviewed medications with patient and discussed importance of compliance . Discussed plans with patient for ongoing care management follow up and provided patient with direct contact information for care management team . Advised patient, providing education and rationale, to monitor blood pressure daily and record, calling PCP for findings outside established parameters.  . Reviewed scheduled/upcoming provider appointments including: 05-21-2020 at 0920 am Patient Goals/Self-Care  Activities . Over the next 120 days, patient will:  - Calls provider office for new concerns, questions, or BP outside discussed parameters Checks BP and records as discussed Follows a low sodium diet/DASH diet - blood pressure trends reviewed - depression screen reviewed - home or ambulatory blood pressure  monitoring encouraged Follow Up Plan: Telephone follow up appointment with care management team member scheduled for:  06-18-2020 at 1030 am   Task: RNCM: Identify and Monitor Blood Pressure Elevation   Note:   Care Management Activities:    - blood pressure trends reviewed - depression screen reviewed - home or ambulatory blood pressure monitoring encouraged         Patient verbalizes understanding of instructions provided today.  Telephone follow up appointment with care management team member scheduled for: 06-18-2020 at 76 am  Noreene Larsson RN, MSN, [facility] Mobile: [phone]

## 2020-04-23 NOTE — Chronic Care Management (AMB) (Signed)
Chronic Care Management   CCM RN Visit Note  04/23/2020 Name: Tyler Cohen MRN: [id number] DOB: 1956-02-03  Subjective: Tyler Cohen is a 65 y.o. year old male who is a primary care patient of Valerie Roys, DO. The care management team was consulted for assistance with disease management and care coordination needs.    Engaged with patient by telephone for follow up visit in response to provider referral for case management and/or care coordination services.   Consent to Services:  Currently engaged with the CCM team  Patient agreed to services and verbal consent obtained.   Assessment: Review of patient past medical history, allergies, medications, health status, including review of consultants reports, laboratory and other test data, was performed as part of comprehensive evaluation and provision of chronic care management services.   SDOH (Social Determinants of Health) assessments and interventions performed:    CCM Care Plan  No Known Allergies  Outpatient Encounter Medications as of 04/23/2020  Medication Sig   albuterol (VENTOLIN HFA) 108 (90 Base) MCG/ACT inhaler Inhale 2 puffs into the lungs every 6 (six) hours as needed for wheezing or shortness of breath.   aspirin 81 MG tablet Take 81 mg by mouth daily.   atorvastatin (LIPITOR) 20 MG tablet TAKE 1 TABLET(20 MG) BY MOUTH AT BEDTIME   b complex vitamins tablet Take 1 tablet by mouth daily.   clobetasol ointment (TEMOVATE) 0.16 % Apply 1 application topically 2 (two) times daily.   clotrimazole-betamethasone (LOTRISONE) cream Apply 1 application topically 2 (two) times daily.   lidocaine (XYLOCAINE) 5 % ointment Apply 1 application topically as needed.   methimazole (TAPAZOLE) 10 MG tablet Take 2 tablets (20 mg total) by mouth 2 (two) times daily.   propylthiouracil (PTU) 50 MG tablet Take by mouth 2 (two) times daily.  (Patient not taking: Reported on 04/09/2020)   tamsulosin (FLOMAX) 0.4 MG CAPS capsule  TAKE 1 CAPSULE(0.4 MG) BY MOUTH DAILY   No facility-administered encounter medications on file as of 04/23/2020.    Patient Active Problem List   Diagnosis Date Noted   BPH (benign prostatic hyperplasia) 10/27/2018   Multinodular goiter 12/08/2017   Hyperthyroidism 12/08/2017   Left thyroid nodule 05/04/2017   Adrenal mass ([place]) 05/04/2017   Renal mass 01/17/2017   Liver lesion 11/29/2016   Advanced care planning/counseling discussion 11/02/2016   Digital mucinous cyst of finger of right hand 11/02/2016   Benign neoplasm of sigmoid colon    Psoriasis 09/03/2015   Tobacco abuse    Macrocytosis    Hyperlipidemia 08/29/2014   Dental disease 08/29/2014    Conditions to be addressed/monitored:HTN and HLD  Care Plan : RNCM: HLD Management  Updates made by Vanita Ingles since 04/23/2020 12:00 AM    Problem: RNCM: Management of HLD   Priority: Medium    Goal: RNCM: HLD Management   Priority: Medium  Note:   Current Barriers:   Poorly controlled hyperlipidemia, complicated by smoking, homelessness, HTN  Current antihyperlipidemic regimen: Lipitor 20 mg   Most recent lipid panel:     Component Value Date/Time   CHOL 151 01/14/2020 1050   TRIG 58 01/14/2020 1050   HDL 45 01/14/2020 1050   CHOLHDL 3.4 11/08/2017 1118   [name] 94 01/14/2020 1050     ASCVD risk enhancing conditions: age 60, HTN, current smoker  Unable to independently manage HLD  Lacks social connections  Does not maintain contact with provider office  Does not contact provider office for questions/concerns  Homeless  RN Care Manager Clinical Goal(s):   Over the next 120 days, patient will work with Consulting civil engineer, providers, and care team towards execution of optimized self-health management plan  Over the next 120 days, patient will verbalize understanding of plan for HLD management   Over the next 120 days, patient will work with [facility], and pcp to address needs related to  effective management of HLD   Over the next 120 days, patient will attend all scheduled medical appointments: 05-21-2020 at 0920 am  Over the next 120 days, patient will work with care guides and CCM team  (community agency) to assist with additional housing and transportation needs or new SDOH needs   Interventions:  Collaboration with Valerie Roys, DO regarding development and update of comprehensive plan of care as evidenced by provider attestation and co-signature  Inter-disciplinary care team collaboration (see longitudinal plan of care)  Medication review performed; medication list updated in electronic medical record.   Inter-disciplinary care team collaboration (see longitudinal plan of care)  Referred to pharmacy team for assistance with HLD medication management  Evaluation of current treatment plan related to HLD and patient's adherence to plan as established by provider.  Advised patient to call the office for changes in condition or new questions  Provided education to patient re: heart healthy diet, keeping appointments, calling for resources as needed   Discussed plans with patient for ongoing care management follow up and provided patient with direct contact information for care management team  Provided patient and/or caregiver with housing/transportation  information about resources in [employer]. The patient has transportation information if needed. Is working on finding secure housing for him and his spouse (spouse currently in SNF) (Data processing manager).  Reviewed scheduled/upcoming provider appointments including: 05-21-2020 at 0920 am   Patient Goals/Self-Care Activities:  Over the next 120 days, patient will:   - call for medicine refill 2 or 3 days before it runs out - call if I am sick and can't take my medicine - keep a list of all the medicines I take; vitamins and herbals too - learn to read medicine labels - use a pillbox to sort medicine -  use an alarm clock or phone to remind me to take my medicine - change to whole grain breads, cereal, pasta - drink 6 to 8 glasses of water each day - eat 5 or 6 small meals each day - fill half the plate with nonstarchy vegetables - keep a food diary - limit fast food meals to no more than 1 per week - manage portion size - prepare main meal at home 3 to 5 days each week - be open to making changes - I can manage, know and watch for signs of a heart attack - if I have chest pain, call for help - learn about small changes that will make a big difference - learn my personal risk factors - education plan reviewed and/or amended - empathy and reassurance conveyed - family/caregiver participation in learning encouraged - health literacy screen reviewed - patient's preferred learning methods utilized - questions encouraged - readiness to learn monitored   Follow Up Plan: Telephone follow up appointment with care management team member scheduled for: 06-18-2020 at 1030     Task: RNCM: HLD management   Note:   Care Management Activities:    - education plan reviewed and/or amended - empathy and reassurance conveyed - family/caregiver participation in learning encouraged - health literacy screen reviewed - patient's preferred learning methods  utilized - questions encouraged - readiness to learn monitored       Care Plan : RNCM: Hypertension (Adult)  Updates made by Vanita Ingles since 04/23/2020 12:00 AM    Problem: RNCM: Hypertension (Hypertension)   Priority: Medium    Goal: RNCM: Hypertension Monitored   Priority: Medium  Note:   Objective:   Last practice recorded BP readings:  BP Readings from Last 3 Encounters:  04/09/20 100/63  03/13/20 125/75  02/25/20 119/78     Most recent eGFR/CrCl: No results found for: EGFR  No components found for: CRCL Current Barriers:   Knowledge Deficits related to basic understanding of hypertension pathophysiology and self care  management  Knowledge Deficits related to understanding of medications prescribed for management of hypertension  Transportation barriers  Limited Conservation officer, nature.   Unable to independently manage HTN  Lacks social connections  Does not maintain contact with provider office  Does not contact provider office for questions/concerns Case Manager Clinical Goal(s):   Over the next 120 days, patient will verbalize understanding of plan for hypertension management  Over the next 120 days, patient will attend all scheduled medical appointments: 05-21-2020 at 0920 pm  Over the next 120 days, patient will demonstrate improved adherence to prescribed treatment plan for hypertension as evidenced by taking all medications as prescribed, monitoring and recording blood pressure as directed, adhering to low sodium/DASH diet  Over the next 120 days, patient will demonstrate improved health management independence as evidenced by checking blood pressure as directed and notifying PCP if SBP>160 or DBP > 90, taking all medications as prescribe, and adhering to a low sodium diet as discussed.  Over the next 120 days, patient will verbalize basic understanding of hypertension disease process and self health management plan as evidenced by taking medications as directed, keeping appointments and working with the CCM team to optimize health and well being  Interventions:   Collaboration with Valerie Roys, DO regarding development and update of comprehensive plan of care as evidenced by provider attestation and co-signature  Inter-disciplinary care team collaboration (see longitudinal plan of care)  Evaluation of current treatment plan related to hypertension self management and patient's adherence to plan as established by provider.  Provided education to patient re: stroke prevention, s/s of heart attack and stroke, DASH diet, complications of uncontrolled blood  pressure  Reviewed medications with patient and discussed importance of compliance  Discussed plans with patient for ongoing care management follow up and provided patient with direct contact information for care management team  Advised patient, providing education and rationale, to monitor blood pressure daily and record, calling PCP for findings outside established parameters.   Reviewed scheduled/upcoming provider appointments including: 05-21-2020 at 0920 am Patient Goals/Self-Care Activities  Over the next 120 days, patient will:  - Calls provider office for new concerns, questions, or BP outside discussed parameters Checks BP and records as discussed Follows a low sodium diet/DASH diet - blood pressure trends reviewed - depression screen reviewed - home or ambulatory blood pressure monitoring encouraged Follow Up Plan: Telephone follow up appointment with care management team member scheduled for:  06-18-2020 at 1030 am   Task: RNCM: Identify and Monitor Blood Pressure Elevation   Note:   Care Management Activities:    - blood pressure trends reviewed - depression screen reviewed - home or ambulatory blood pressure monitoring encouraged         Plan:Telephone follow up appointment with care management team member scheduled for:  06-18-2020 at 10:30 am   Noreene Larsson RN, MSN, [facility] Mobile: [phone]

## 2020-05-21 ENCOUNTER — Other Ambulatory Visit: Payer: Self-pay

## 2020-05-21 ENCOUNTER — Ambulatory Visit (INDEPENDENT_AMBULATORY_CARE_PROVIDER_SITE_OTHER): Payer: Medicare HMO | Admitting: Family Medicine

## 2020-05-21 ENCOUNTER — Encounter: Payer: Self-pay | Admitting: Family Medicine

## 2020-05-21 VITALS — BP 126/77 | HR 80 | Temp 98.3°F | Wt 163.6 lb

## 2020-05-21 DIAGNOSIS — E0590 Thyrotoxicosis, unspecified without thyrotoxic crisis or storm: Secondary | ICD-10-CM | POA: Diagnosis not present

## 2020-05-21 NOTE — Progress Notes (Signed)
BP 126/77   Pulse 80   Temp 98.3 F (36.8 C) (Oral)   Wt 163 lb 9.6 oz (74.2 kg)   SpO2 97%   BMI 21.58 kg/m    Subjective:    Patient ID: Tyler Cohen, male    DOB: Nov 07, 1955, 65 y.o.   MRN: [id number]  HPI: Tyler Cohen is a 65 y.o. male  Chief Complaint  Patient presents with  . Hyperthyroidism   HYPOTHYROIDISM Thyroid control status:better Satisfied with current treatment? yes Medication side effects: no Medication compliance: excellent compliance Recent dose adjustment:yes Fatigue: no Cold intolerance: no Heat intolerance: no Weight gain: yes Weight loss: no Constipation: no Diarrhea/loose stools: no Palpitations: no Lower extremity edema: no Anxiety/depressed mood: no   Relevant past medical, surgical, family and social history reviewed and updated as indicated. Interim medical history since our last visit reviewed. Allergies and medications reviewed and updated.  Review of Systems  Constitutional: Negative.   Respiratory: Negative.   Cardiovascular: Negative.   Gastrointestinal: Negative.   Endocrine: Negative.   Musculoskeletal: Negative.   Psychiatric/Behavioral: Negative.     Per HPI unless specifically indicated above     Objective:    BP 126/77   Pulse 80   Temp 98.3 F (36.8 C) (Oral)   Wt 163 lb 9.6 oz (74.2 kg)   SpO2 97%   BMI 21.58 kg/m   Wt Readings from Last 3 Encounters:  05/21/20 163 lb 9.6 oz (74.2 kg)  04/09/20 161 lb (73 kg)  03/13/20 153 lb (69.4 kg)    Physical Exam Vitals and nursing note reviewed.  Constitutional:      General: He is not in acute distress.    Appearance: Normal appearance. He is not ill-appearing, toxic-appearing or diaphoretic.  HENT:     Head: Normocephalic and atraumatic.     Right Ear: External ear normal.     Left Ear: External ear normal.     Nose: Nose normal.     Mouth/Throat:     Mouth: Mucous membranes are moist.     Pharynx: Oropharynx is clear.  Eyes:     General: No  scleral icterus.       Right eye: No discharge.        Left eye: No discharge.     Extraocular Movements: Extraocular movements intact.     Conjunctiva/sclera: Conjunctivae normal.     Pupils: Pupils are equal, round, and reactive to light.  Cardiovascular:     Rate and Rhythm: Normal rate and regular rhythm.     Pulses: Normal pulses.     Heart sounds: Normal heart sounds. No murmur heard. No friction rub. No gallop.   Pulmonary:     Effort: Pulmonary effort is normal. No respiratory distress.     Breath sounds: Normal breath sounds. No stridor. No wheezing, rhonchi or rales.  Chest:     Chest wall: No tenderness.  Musculoskeletal:        General: Normal range of motion.     Cervical back: Normal range of motion and neck supple.  Skin:    General: Skin is warm and dry.     Capillary Refill: Capillary refill takes less than 2 seconds.     Coloration: Skin is not jaundiced or pale.     Findings: No bruising, erythema, lesion or rash.  Neurological:     General: No focal deficit present.     Mental Status: He is alert and oriented to person, place, and time. Mental  status is at baseline.  Psychiatric:        Mood and Affect: Mood normal.        Behavior: Behavior normal.        Thought Content: Thought content normal.        Judgment: Judgment normal.     Results for orders placed or performed in visit on 04/09/20  Thyroid Panel With TSH  Result Value Ref Range   TSH <0.005 (L) 0.450 - 4.500 uIU/mL   T4, Total 5.5 4.5 - 12.0 ug/dL   T3 Uptake Ratio <2 (L) 24 - 39 %   Free Thyroxine Index <.1 (L) 1.2 - 4.9  PSA  Result Value Ref Range   Prostate Specific Ag, Serum 4.8 (H) 0.0 - 4.0 ng/mL      Assessment & Plan:   Problem List Items Addressed This Visit      Endocrine   Hyperthyroidism - Primary    Rechecking labs today. Await results. Treat as needed. Continue to monitor.       Relevant Orders   TSH       Follow up plan: Return in about 6 weeks (around  07/02/2020).

## 2020-05-21 NOTE — Patient Instructions (Addendum)
[facility] is located in the [place]. [place], [place] [address] [phone]

## 2020-05-21 NOTE — Assessment & Plan Note (Signed)
Rechecking labs today. Await results. Treat as needed. Continue to monitor.  °

## 2020-05-22 LAB — TSH: TSH: 0.305 u[IU]/mL — ABNORMAL LOW (ref 0.450–4.500)

## 2020-05-24 ENCOUNTER — Other Ambulatory Visit: Payer: Self-pay | Admitting: Family Medicine

## 2020-06-13 ENCOUNTER — Other Ambulatory Visit: Payer: Self-pay | Admitting: Family Medicine

## 2020-06-13 MED ORDER — PROPYLTHIOURACIL 50 MG PO TABS: 100.0000 mg | ORAL_TABLET | Freq: Three times a day (TID) | ORAL | 1 refills | Status: DC

## 2020-06-13 NOTE — Telephone Encounter (Signed)
   Requested medications are on the active medication list yes  Last refill THIS SAYS IT WAS FILLED 3/12 ALTHOUGH IT IS JUST 3/11  Last visit 2/16  Future visit scheduled yes, 3/16  Notes to clinic Not Delegated.

## 2020-06-13 NOTE — Progress Notes (Signed)
Please let him know that his thyroid is better, but still under active. We can't go up on his methimazole, so I'd like to start him on some PTU and recheck it with a visit in 6 weeks (med sent to his pharmacy) Please make sure he has a follow up appt in 6 weeks. Thanks!

## 2020-06-18 ENCOUNTER — Telehealth: Payer: Self-pay

## 2020-06-25 DIAGNOSIS — I700 Atherosclerosis of aorta: Secondary | ICD-10-CM | POA: Insufficient documentation

## 2020-07-02 ENCOUNTER — Ambulatory Visit (INDEPENDENT_AMBULATORY_CARE_PROVIDER_SITE_OTHER): Payer: Medicare HMO | Admitting: Family Medicine

## 2020-07-02 ENCOUNTER — Encounter: Payer: Self-pay | Admitting: Family Medicine

## 2020-07-02 ENCOUNTER — Other Ambulatory Visit: Payer: Self-pay

## 2020-07-02 VITALS — BP 104/65 | HR 90 | Temp 98.1°F | Wt 160.0 lb

## 2020-07-02 DIAGNOSIS — E0590 Thyrotoxicosis, unspecified without thyrotoxic crisis or storm: Secondary | ICD-10-CM | POA: Diagnosis not present

## 2020-07-02 DIAGNOSIS — E7800 Pure hypercholesterolemia, unspecified: Secondary | ICD-10-CM | POA: Diagnosis not present

## 2020-07-02 DIAGNOSIS — N401 Enlarged prostate with lower urinary tract symptoms: Secondary | ICD-10-CM

## 2020-07-02 DIAGNOSIS — R972 Elevated prostate specific antigen [PSA]: Secondary | ICD-10-CM | POA: Diagnosis not present

## 2020-07-02 DIAGNOSIS — R350 Frequency of micturition: Secondary | ICD-10-CM | POA: Diagnosis not present

## 2020-07-02 DIAGNOSIS — I700 Atherosclerosis of aorta: Secondary | ICD-10-CM

## 2020-07-02 MED ORDER — TAMSULOSIN HCL 0.4 MG PO CAPS: ORAL_CAPSULE | ORAL | 1 refills | Status: DC

## 2020-07-02 MED ORDER — ALBUTEROL SULFATE HFA 108 (90 BASE) MCG/ACT IN AERS: 2.0000 | INHALATION_SPRAY | Freq: Four times a day (QID) | RESPIRATORY_TRACT | 6 refills | Status: DC | PRN

## 2020-07-02 MED ORDER — ATORVASTATIN CALCIUM 20 MG PO TABS: ORAL_TABLET | ORAL | 1 refills | Status: DC

## 2020-07-02 NOTE — Assessment & Plan Note (Signed)
Under good control on current regimen. Continue current regimen. Continue to monitor. Call with any concerns. Refills given. Labs drawn today.   

## 2020-07-02 NOTE — Progress Notes (Signed)
BP 104/65   Pulse 90   Temp 98.1 F (36.7 C) (Oral)   Wt 160 lb (72.6 kg)   SpO2 98%   BMI 21.11 kg/m    Subjective:    Patient ID: Tyler Cohen, male    DOB: [birth year], 65 y.o.   MRN: [id number]  HPI: Tyler Cohen is a 65 y.o. male  Chief Complaint  Patient presents with  . Hyperthyroidism    6 week f/up    HYPERTHYROIDISM Thyroid control status:unsure Satisfied with current treatment? yes Medication side effects: no Medication compliance: good compliance Recent dose adjustment:yes Fatigue: no Cold intolerance: no Heat intolerance: no Weight gain: no Weight loss: no Constipation: no Diarrhea/loose stools: no Palpitations: no Lower extremity edema: no Anxiety/depressed mood: no   HYPERLIPIDEMIA Hyperlipidemia status: excellent compliance Satisfied with current treatment?  yes Side effects:  no Medication compliance: excellent compliance Past cholesterol meds: atorvastatin Supplements: none Aspirin:  yes The 10-year ASCVD risk score Mikey Bussing DC Jr., et al., 2013) is: 10.7%   Values used to calculate the score:     Age: 79 years     Sex: Male     Is Non-Hispanic African American: Yes     Diabetic: No     Tobacco smoker: Yes     Systolic Blood Pressure: 384 mmHg     Is BP treated: No     HDL Cholesterol: 45 mg/dL     Total Cholesterol: 151 mg/dL Chest pain:  no  BPH BPH status: controlled Satisfied with current treatment?: yes Medication side effects: no Medication compliance: excellent compliance Duration: chronic Nocturia: 1/night Urinary frequency:no Incomplete voiding: no Urgency: no Weak urinary stream: no Straining to start stream: no Dysuria: no Onset: gradual Severity: mild   Relevant past medical, surgical, family and social history reviewed and updated as indicated. Interim medical history since our last visit reviewed. Allergies and medications reviewed and updated.  Review of Systems  Constitutional: Negative.    Respiratory: Negative.   Cardiovascular: Negative.   Gastrointestinal: Negative.   Genitourinary: Negative.   Musculoskeletal: Negative.   Psychiatric/Behavioral: Negative.     Per HPI unless specifically indicated above     Objective:    BP 104/65   Pulse 90   Temp 98.1 F (36.7 C) (Oral)   Wt 160 lb (72.6 kg)   SpO2 98%   BMI 21.11 kg/m   Wt Readings from Last 3 Encounters:  07/02/20 160 lb (72.6 kg)  05/21/20 163 lb 9.6 oz (74.2 kg)  04/09/20 161 lb (73 kg)    Physical Exam Vitals and nursing note reviewed.  Constitutional:      General: He is not in acute distress.    Appearance: Normal appearance. He is not ill-appearing, toxic-appearing or diaphoretic.  HENT:     Head: Normocephalic and atraumatic.     Right Ear: External ear normal.     Left Ear: External ear normal.     Nose: Nose normal.     Mouth/Throat:     Mouth: Mucous membranes are moist.     Pharynx: Oropharynx is clear.  Eyes:     General: No scleral icterus.       Right eye: No discharge.        Left eye: No discharge.     Extraocular Movements: Extraocular movements intact.     Conjunctiva/sclera: Conjunctivae normal.     Pupils: Pupils are equal, round, and reactive to light.  Cardiovascular:     Rate and Rhythm: Normal  rate and regular rhythm.     Pulses: Normal pulses.     Heart sounds: Normal heart sounds. No murmur heard. No friction rub. No gallop.   Pulmonary:     Effort: Pulmonary effort is normal. No respiratory distress.     Breath sounds: Normal breath sounds. No stridor. No wheezing, rhonchi or rales.  Chest:     Chest wall: No tenderness.  Musculoskeletal:        General: Normal range of motion.     Cervical back: Normal range of motion and neck supple.  Skin:    General: Skin is warm and dry.     Capillary Refill: Capillary refill takes less than 2 seconds.     Coloration: Skin is not jaundiced or pale.     Findings: No bruising, erythema, lesion or rash.  Neurological:      General: No focal deficit present.     Mental Status: He is alert and oriented to person, place, and time. Mental status is at baseline.  Psychiatric:        Mood and Affect: Mood normal.        Behavior: Behavior normal.        Thought Content: Thought content normal.        Judgment: Judgment normal.     Results for orders placed or performed in visit on 05/21/20  TSH  Result Value Ref Range   TSH 0.305 (L) 0.450 - 4.500 uIU/mL      Assessment & Plan:   Problem List Items Addressed This Visit      Cardiovascular and Mediastinum   Aortic atherosclerosis ([place])    Will keep BP and cholesterol under good control. Continue to monitor.       Relevant Medications   atorvastatin (LIPITOR) 20 MG tablet     Endocrine   Hyperthyroidism - Primary    Rechecking labs today. Await results. Treat as needed.       Relevant Orders   Thyroid Panel With TSH     Genitourinary   BPH (benign prostatic hyperplasia)    Under good control on current regimen. Continue current regimen. Continue to monitor. Call with any concerns. Refills given. Labs drawn today.        Relevant Medications   tamsulosin (FLOMAX) 0.4 MG CAPS capsule     Other   Hyperlipidemia    Under good control on current regimen. Continue current regimen. Continue to monitor. Call with any concerns. Refills given. Labs drawn today.        Relevant Medications   atorvastatin (LIPITOR) 20 MG tablet   Other Relevant Orders   Comprehensive metabolic panel   Lipid Panel w/o Chol/HDL Ratio    Other Visit Diagnoses    Elevated PSA       Rechecking labs today. Await results. Treat as needed.    Relevant Orders   PSA       Follow up plan: Return in about 6 weeks (around 08/13/2020).

## 2020-07-02 NOTE — Assessment & Plan Note (Signed)
Will keep BP and cholesterol under good control. Continue to monitor.  

## 2020-07-02 NOTE — Assessment & Plan Note (Signed)
Rechecking labs today. Await results. Treat as needed.  °

## 2020-07-03 LAB — COMPREHENSIVE METABOLIC PANEL
ALT: 14 IU/L (ref 0–44)
AST: 17 IU/L (ref 0–40)
Albumin/Globulin Ratio: 1.4 (ref 1.2–2.2)
Albumin: 4.2 g/dL (ref 3.8–4.8)
Alkaline Phosphatase: 103 IU/L (ref 44–121)
BUN/Creatinine Ratio: 12 (ref 10–24)
BUN: 12 mg/dL (ref 8–27)
Bilirubin Total: 0.7 mg/dL (ref 0.0–1.2)
CO2: 21 mmol/L (ref 20–29)
Calcium: 9.3 mg/dL (ref 8.6–10.2)
Chloride: 100 mmol/L (ref 96–106)
Creatinine, Ser: 1.04 mg/dL (ref 0.76–1.27)
Globulin, Total: 2.9 g/dL (ref 1.5–4.5)
Glucose: 102 mg/dL — ABNORMAL HIGH (ref 65–99)
Potassium: 4.1 mmol/L (ref 3.5–5.2)
Sodium: 138 mmol/L (ref 134–144)
Total Protein: 7.1 g/dL (ref 6.0–8.5)
eGFR: 80 mL/min/{1.73_m2} (ref >59)

## 2020-07-03 LAB — LIPID PANEL W/O CHOL/HDL RATIO
Cholesterol, Total: 187 mg/dL (ref 100–199)
HDL: 53 mg/dL (ref >39)
LDL Chol Calc (NIH): 118 mg/dL — ABNORMAL HIGH (ref 0–99)
Triglycerides: 87 mg/dL (ref 0–149)
VLDL Cholesterol Cal: 16 mg/dL (ref 5–40)

## 2020-07-03 LAB — PSA: Prostate Specific Ag, Serum: 6.7 ng/mL — ABNORMAL HIGH (ref 0.0–4.0)

## 2020-07-03 LAB — THYROID PANEL WITH TSH
Free Thyroxine Index: 2.3 (ref 1.2–4.9)
T3 Uptake Ratio: 30 % (ref 24–39)
T4, Total: 7.6 ug/dL (ref 4.5–12.0)
TSH: 0.56 u[IU]/mL (ref 0.450–4.500)

## 2020-07-04 ENCOUNTER — Other Ambulatory Visit: Payer: Self-pay | Admitting: Family Medicine

## 2020-07-22 ENCOUNTER — Other Ambulatory Visit: Payer: Self-pay | Admitting: Family Medicine

## 2020-07-22 NOTE — Telephone Encounter (Signed)
  Notes to clinic:   Patient requests 90 days supply  Requested Prescriptions  Pending Prescriptions Disp Refills   propylthiouracil (PTU) 50 MG tablet [Pharmacy Med Name: PROPYLTHIOURACIL 50MG  TABLETS] 540 tablet     Sig: TAKE 2 TABLETS(100 MG) BY MOUTH THREE TIMES DAILY      Not Delegated - Endocrinology:  Hyperthyroid Agents Failed - 07/22/2020  7:41 AM      Failed - This refill cannot be delegated      Passed - TSH in normal range and within 180 days    TSH  Date Value Ref Range Status  07/02/2020 0.560 0.450 - 4.500 uIU/mL Final          Passed - Valid encounter within last 6 months    Recent Outpatient Visits           2 weeks ago Hyperthyroidism   [place], Megan P, DO   2 months ago Hyperthyroidism   [place], [place], DO   3 months ago Hyperthyroidism   [place], [place], DO   4 months ago Hyperthyroidism   [place], Megan P, DO   6 months ago Routine general medical examination at a health care facility   [place], [place], DO       Future Appointments             In 3 weeks [name], Barb Merino, DO [place], [place]   In 5 months  [employer], [place]   In 7 months Stoioff, Ronda Fairly, MD [facility]

## 2020-07-22 NOTE — Telephone Encounter (Signed)
Last apt on 07/02/2020 per note Return in about 6 weeks (around 08/13/2020). Pt is scheduled for 08/13/2020

## 2020-07-29 ENCOUNTER — Telehealth: Payer: Self-pay

## 2020-07-29 ENCOUNTER — Telehealth: Payer: Self-pay | Admitting: General Practice

## 2020-07-29 NOTE — Telephone Encounter (Signed)
  Chronic Care Management   Outreach Note  07/29/2020 Name: Tyler Cohen MRN: [id number] DOB: [birth year]  Referred by: Valerie Roys, DO Reason for referral : Appointment (RNCM: Follow up for Chronic Disease management and care coordination needs. Call attempt.)   An unsuccessful telephone outreach was attempted today. The patient was referred to the case management team for assistance with care management and care coordination. The patient answered but was driving, ask for appointment to be rescheduled.   Follow Up Plan: The care management team will reach out to the patient again over the next 30 days.   Noreene Larsson RN, MSN, [facility] Mobile: [phone]

## 2020-08-01 NOTE — Telephone Encounter (Signed)
Patient has been rescheduled.

## 2020-08-13 ENCOUNTER — Other Ambulatory Visit: Payer: Self-pay

## 2020-08-13 ENCOUNTER — Encounter: Payer: Self-pay | Admitting: Family Medicine

## 2020-08-13 ENCOUNTER — Ambulatory Visit (INDEPENDENT_AMBULATORY_CARE_PROVIDER_SITE_OTHER): Payer: Medicare HMO | Admitting: Family Medicine

## 2020-08-13 VITALS — BP 114/72 | HR 87 | Temp 97.9°F | Ht 72.99 in | Wt 150.8 lb

## 2020-08-13 DIAGNOSIS — E0590 Thyrotoxicosis, unspecified without thyrotoxic crisis or storm: Secondary | ICD-10-CM

## 2020-08-13 DIAGNOSIS — R972 Elevated prostate specific antigen [PSA]: Secondary | ICD-10-CM

## 2020-08-13 NOTE — Progress Notes (Signed)
BP 114/72   Pulse 87   Temp 97.9 F (36.6 C) (Oral)   Ht 6' 0.99" (1.854 m)   Wt 150 lb 12.8 oz (68.4 kg)   SpO2 98%   BMI 19.90 kg/m    Subjective:    Patient ID: Tyler Cohen, male    DOB: 1955-06-07, 65 y.o.   MRN: [id number]  HPI: Tyler Cohen is a 65 y.o. male  Chief Complaint  Patient presents with  . Hyperthyroidism   HYPERTHYROIDISM Thyroid control status:stable Satisfied with current treatment? doing OK, but would like to take fewer pills Medication side effects: no Medication compliance: good compliance Recent dose adjustment:yes Fatigue: yes Cold intolerance: no Heat intolerance: no Weight gain: no Weight loss: yes Constipation: no Diarrhea/loose stools: no Palpitations: no Lower extremity edema: no Anxiety/depressed mood: no  Relevant past medical, surgical, family and social history reviewed and updated as indicated. Interim medical history since our last visit reviewed. Allergies and medications reviewed and updated.  Review of Systems  Constitutional: Negative.   Respiratory: Negative.   Cardiovascular: Negative.   Gastrointestinal: Negative.   Musculoskeletal: Negative.   Psychiatric/Behavioral: Negative.     Per HPI unless specifically indicated above     Objective:    BP 114/72   Pulse 87   Temp 97.9 F (36.6 C) (Oral)   Ht 6' 0.99" (1.854 m)   Wt 150 lb 12.8 oz (68.4 kg)   SpO2 98%   BMI 19.90 kg/m   Wt Readings from Last 3 Encounters:  08/13/20 150 lb 12.8 oz (68.4 kg)  07/02/20 160 lb (72.6 kg)  05/21/20 163 lb 9.6 oz (74.2 kg)    Physical Exam Vitals and nursing note reviewed.  Constitutional:      General: He is not in acute distress.    Appearance: Normal appearance. He is not ill-appearing, toxic-appearing or diaphoretic.  HENT:     Head: Normocephalic and atraumatic.     Right Ear: External ear normal.     Left Ear: External ear normal.     Nose: Nose normal.     Mouth/Throat:     Mouth: Mucous membranes  are moist.     Pharynx: Oropharynx is clear.  Eyes:     General: No scleral icterus.       Right eye: No discharge.        Left eye: No discharge.     Extraocular Movements: Extraocular movements intact.     Conjunctiva/sclera: Conjunctivae normal.     Pupils: Pupils are equal, round, and reactive to light.  Cardiovascular:     Rate and Rhythm: Normal rate and regular rhythm.     Pulses: Normal pulses.     Heart sounds: Normal heart sounds. No murmur heard. No friction rub. No gallop.   Pulmonary:     Effort: Pulmonary effort is normal. No respiratory distress.     Breath sounds: Normal breath sounds. No stridor. No wheezing, rhonchi or rales.  Chest:     Chest wall: No tenderness.  Musculoskeletal:        General: Normal range of motion.     Cervical back: Normal range of motion and neck supple.  Skin:    General: Skin is warm and dry.     Capillary Refill: Capillary refill takes less than 2 seconds.     Coloration: Skin is not jaundiced or pale.     Findings: No bruising, erythema, lesion or rash.  Neurological:     General: No focal  deficit present.     Mental Status: He is alert and oriented to person, place, and time. Mental status is at baseline.  Psychiatric:        Mood and Affect: Mood normal.        Behavior: Behavior normal.        Thought Content: Thought content normal.        Judgment: Judgment normal.     Results for orders placed or performed in visit on 07/02/20  Thyroid Panel With TSH  Result Value Ref Range   TSH 0.560 0.450 - 4.500 uIU/mL   T4, Total 7.6 4.5 - 12.0 ug/dL   T3 Uptake Ratio 30 24 - 39 %   Free Thyroxine Index 2.3 1.2 - 4.9  Comprehensive metabolic panel  Result Value Ref Range   Glucose 102 (H) 65 - 99 mg/dL   BUN 12 8 - 27 mg/dL   Creatinine, Ser 1.04 0.76 - 1.27 mg/dL   eGFR 80 >59 mL/min/1.73   BUN/Creatinine Ratio 12 10 - 24   Sodium 138 134 - 144 mmol/L   Potassium 4.1 3.5 - 5.2 mmol/L   Chloride 100 96 - 106 mmol/L   CO2  21 20 - 29 mmol/L   Calcium 9.3 8.6 - 10.2 mg/dL   Total Protein 7.1 6.0 - 8.5 g/dL   Albumin 4.2 3.8 - 4.8 g/dL   Globulin, Total 2.9 1.5 - 4.5 g/dL   Albumin/Globulin Ratio 1.4 1.2 - 2.2   Bilirubin Total 0.7 0.0 - 1.2 mg/dL   Alkaline Phosphatase 103 44 - 121 IU/L   AST 17 0 - 40 IU/L   ALT 14 0 - 44 IU/L  Lipid Panel w/o Chol/HDL Ratio  Result Value Ref Range   Cholesterol, Total 187 100 - 199 mg/dL   Triglycerides 87 0 - 149 mg/dL   HDL 53 >39 mg/dL   VLDL Cholesterol Cal 16 5 - 40 mg/dL   LDL Chol Calc (NIH) 118 (H) 0 - 99 mg/dL  PSA  Result Value Ref Range   Prostate Specific Ag, Serum 6.7 (H) 0.0 - 4.0 ng/mL      Assessment & Plan:   Problem List Items Addressed This Visit      Endocrine   Hyperthyroidism - Primary    TSH has been in the normal range, but he has to take a lot of pills. Previously had treatment with I-131 ablation 07/03/18. There was discussion of repeating this, but he stopped his medicine and never followed up. He would be willing to consider another ablation as he has been taking a lot of medicine. Would like to see a different endocrinologist. Referral generated today. Will recheck labs. Continue current regimen for now.       Relevant Orders   Thyroid Panel With TSH   Ambulatory referral to Endocrinology    Other Visit Diagnoses    Elevated PSA       Elevated last visit. Will recheck today.   Relevant Orders   PSA       Follow up plan: Return in about 2 months (around 10/13/2020).

## 2020-08-13 NOTE — Assessment & Plan Note (Signed)
TSH has been in the normal range, but he has to take a lot of pills. Previously had treatment with I-131 ablation 07/03/18. There was discussion of repeating this, but he stopped his medicine and never followed up. He would be willing to consider another ablation as he has been taking a lot of medicine. Would like to see a different endocrinologist. Referral generated today. Will recheck labs. Continue current regimen for now.

## 2020-08-14 LAB — THYROID PANEL WITH TSH
Free Thyroxine Index: 5.9 — ABNORMAL HIGH (ref 1.2–4.9)
T3 Uptake Ratio: 43 % — ABNORMAL HIGH (ref 24–39)
T4, Total: 13.7 ug/dL — ABNORMAL HIGH (ref 4.5–12.0)
TSH: <0.005 u[IU]/mL — ABNORMAL LOW (ref 0.450–4.500)

## 2020-08-14 LAB — PSA: Prostate Specific Ag, Serum: 8.2 ng/mL — ABNORMAL HIGH (ref 0.0–4.0)

## 2020-08-17 ENCOUNTER — Other Ambulatory Visit: Payer: Self-pay | Admitting: Family Medicine

## 2020-08-17 DIAGNOSIS — R972 Elevated prostate specific antigen [PSA]: Secondary | ICD-10-CM

## 2020-09-12 ENCOUNTER — Encounter: Payer: Self-pay | Admitting: Urology

## 2020-09-12 ENCOUNTER — Ambulatory Visit: Payer: Self-pay | Admitting: Urology

## 2020-09-12 NOTE — Progress Notes (Deleted)
09/12/2020 7:48 AM   Tyler Cohen 05/09/1955 [id number]  Referring provider: Valerie Roys, DO [place],  [place] [address]  No chief complaint on file.   Urologic history: 1.  Small left renal mass             -Incidentally identified MRI September 2018             -Elected surveillance  HPI: 65 y.o. male referred for evaluation of an elevated PSA.  Followed regularly for a small enhancing left renal mass; last office visit 03/24/2020 Slowly rising PSA since 01/23/2020 with last PSA 08/13/2020 8.2 No bothersome LUTS Denies dysuria, gross hematuria Denies flank, abdominal or pelvic pain     PMH: Past Medical History:  Diagnosis Date   Arthritis    Heart murmur    Hyperlipidemia    Macrocytosis    Thyroid disease    Tobacco abuse     Surgical History: Past Surgical History:  Procedure Laterality Date   COLONOSCOPY WITH PROPOFOL N/A 12/01/2015   Procedure: COLONOSCOPY WITH PROPOFOL;  Surgeon: Lucilla Lame, MD;  Location: [facility];  Service: Endoscopy;  Laterality: N/A;   POLYPECTOMY  12/01/2015   Procedure: POLYPECTOMY;  Surgeon: Lucilla Lame, MD;  Location: [facility];  Service: Endoscopy;;    Home Medications:  Allergies as of 09/12/2020   No Known Allergies      Medication List        Accurate as of September 12, 2020  7:48 AM. If you have any questions, ask your nurse or doctor.          albuterol 108 (90 Base) MCG/ACT inhaler Commonly known as: VENTOLIN HFA INHALE 2 PUFFS INTO THE LUNGS EVERY 6 HOURS AS NEEDED FOR WHEEZING OR SHORTNESS OF BREATH   aspirin 81 MG tablet Take 81 mg by mouth daily.   atorvastatin 20 MG tablet Commonly known as: LIPITOR TAKE 1 TABLET(20 MG) BY MOUTH AT BEDTIME   b complex vitamins tablet Take 1 tablet by mouth daily.   clobetasol ointment 0.05 % Commonly known as: TEMOVATE Apply 1 application topically 2 (two) times daily.   clotrimazole-betamethasone cream Commonly known as:  Lotrisone Apply 1 application topically 2 (two) times daily.   lidocaine 5 % ointment Commonly known as: XYLOCAINE Apply 1 application topically as needed.   methimazole 10 MG tablet Commonly known as: TAPAZOLE Take 2 tablets (20 mg total) by mouth 2 (two) times daily.   propylthiouracil 50 MG tablet Commonly known as: PTU TAKE 2 TABLETS(100 MG) BY MOUTH THREE TIMES DAILY   tamsulosin 0.4 MG Caps capsule Commonly known as: FLOMAX TAKE 1 CAPSULE(0.4 MG) BY MOUTH DAILY        Allergies: No Known Allergies  Family History: Family History  Problem Relation Age of Onset   Hypertension Mother    Diabetes Mother        lost both legs   Kidney disease Mother    Hypertension Father    Emphysema Father    Sickle cell trait Father    Hypertension Brother    Hyperlipidemia Brother    Sickle cell trait Sister     Social History:  reports that he has been smoking cigars. He has a 10.50 pack-year smoking history. He has never used smokeless tobacco. He reports current drug use. Drug: Marijuana. He reports that he does not drink alcohol.   Physical Exam: There were no vitals taken for this visit.  Constitutional:  Alert and oriented,  No acute distress. HEENT: [place] AT, moist mucus membranes.  Trachea midline, no masses. Cardiovascular: No clubbing, cyanosis, or edema. Respiratory: Normal respiratory effort, no increased work of breathing. GI: Abdomen is soft, nontender, nondistended, no abdominal masses GU: No CVA tenderness Lymph: No cervical or inguinal lymphadenopathy. Skin: No rashes, bruises or suspicious lesions. Neurologic: Grossly intact, no focal deficits, moving all 4 extremities. Psychiatric: Normal mood and affect.   Assessment & Plan:    1.  Elevated PSA Progressively rising PSA with most recent value 8.2 DRE *** Recommend scheduling TRUS/biopsy of prostate.  The procedure was cussed in detail including potential risks of bleeding and infection/sepsis.  All  questions were answered and he desires to proceed   Abbie Sons, [place] [address], [place],  [address] [phone]

## 2020-09-19 ENCOUNTER — Telehealth: Payer: Self-pay | Admitting: General Practice

## 2020-09-19 ENCOUNTER — Telehealth: Payer: Medicare HMO

## 2020-09-19 NOTE — Telephone Encounter (Signed)
  Care Management   Follow Up Note   09/19/2020 Name: Tyler Cohen MRN: [id number] DOB: 06/30/1955   Referred by: Valerie Roys, DO Reason for referral : Chronic Care Management (RNCM: Follow up for Chronic Disease Management and Care Coordination Needs)   An unsuccessful telephone outreach was attempted today. The patient was referred to the case management team for assistance with care management and care coordination.   Follow Up Plan: A HIPPA compliant phone message was left for the patient providing contact information and requesting a return call.   Noreene Larsson RN, MSN, [facility] Mobile: [phone]

## 2020-10-29 ENCOUNTER — Ambulatory Visit (INDEPENDENT_AMBULATORY_CARE_PROVIDER_SITE_OTHER): Payer: Medicare HMO | Admitting: General Practice

## 2020-10-29 ENCOUNTER — Telehealth: Payer: Medicare HMO | Admitting: General Practice

## 2020-10-29 DIAGNOSIS — I10 Essential (primary) hypertension: Secondary | ICD-10-CM

## 2020-10-29 DIAGNOSIS — E7800 Pure hypercholesterolemia, unspecified: Secondary | ICD-10-CM

## 2020-10-29 DIAGNOSIS — Z720 Tobacco use: Secondary | ICD-10-CM

## 2020-10-29 DIAGNOSIS — Z5900 Homelessness unspecified: Secondary | ICD-10-CM

## 2020-10-29 NOTE — Chronic Care Management (AMB) (Signed)
Chronic Care Management   CCM RN Visit Note  10/29/2020 Name: NAOD SWEETLAND MRN: [id number] DOB: 11/24/1955  Subjective: Tyler Cohen is a 65 y.o. year old male who is a primary care patient of Valerie Roys, DO. The care management team was consulted for assistance with disease management and care coordination needs.    Engaged with patient by telephone for follow up visit in response to provider referral for case management and/or care coordination services.   Consent to Services:  The patient was given information about Chronic Care Management services, agreed to services, and gave verbal consent prior to initiation of services.  Please see initial visit note for detailed documentation.   Patient agreed to services and verbal consent obtained.   Assessment: Review of patient past medical history, allergies, medications, health status, including review of consultants reports, laboratory and other test data, was performed as part of comprehensive evaluation and provision of chronic care management services.   SDOH (Social Determinants of Health) assessments and interventions performed:    CCM Care Plan  No Known Allergies  Outpatient Encounter Medications as of 10/29/2020  Medication Sig   albuterol (VENTOLIN HFA) 108 (90 Base) MCG/ACT inhaler INHALE 2 PUFFS INTO THE LUNGS EVERY 6 HOURS AS NEEDED FOR WHEEZING OR SHORTNESS OF BREATH   aspirin 81 MG tablet Take 81 mg by mouth daily.   atorvastatin (LIPITOR) 20 MG tablet TAKE 1 TABLET(20 MG) BY MOUTH AT BEDTIME   b complex vitamins tablet Take 1 tablet by mouth daily.   clobetasol ointment (TEMOVATE) 0.16 % Apply 1 application topically 2 (two) times daily.   clotrimazole-betamethasone (LOTRISONE) cream Apply 1 application topically 2 (two) times daily.   lidocaine (XYLOCAINE) 5 % ointment Apply 1 application topically as needed.   methimazole (TAPAZOLE) 10 MG tablet Take 2 tablets (20 mg total) by mouth 2 (two) times daily.    propylthiouracil (PTU) 50 MG tablet TAKE 2 TABLETS(100 MG) BY MOUTH THREE TIMES DAILY   tamsulosin (FLOMAX) 0.4 MG CAPS capsule TAKE 1 CAPSULE(0.4 MG) BY MOUTH DAILY   No facility-administered encounter medications on file as of 10/29/2020.    Patient Active Problem List   Diagnosis Date Noted   Aortic atherosclerosis ([place]) 06/25/2020   BPH (benign prostatic hyperplasia) 10/27/2018   Multinodular goiter 12/08/2017   Hyperthyroidism 12/08/2017   Left thyroid nodule 05/04/2017   Adrenal mass ([place]) 05/04/2017   Renal mass 01/17/2017   Liver lesion 11/29/2016   Advanced care planning/counseling discussion 11/02/2016   Digital mucinous cyst of finger of right hand 11/02/2016   Benign neoplasm of sigmoid colon    Psoriasis 09/03/2015   Tobacco abuse    Macrocytosis    Hyperlipidemia 08/29/2014   Dental disease 08/29/2014    Conditions to be addressed/monitored:HTN, HLD, and Smoker and Homeless  Care Plan : RNCM: HLD Management  Updates made by Vanita Ingles since 10/29/2020 12:00 AM     Problem: RNCM: Management of HLD   Priority: Medium     Long-Range Goal: RNCM: HLD Management   Start Date: 04/23/2020  This Visit's Progress: On track  Priority: Medium  Note:   Current Barriers:  Poorly controlled hyperlipidemia, complicated by smoking, homelessness, HTN Current antihyperlipidemic regimen: Lipitor 20 mg  Most recent lipid panel:     Component Value Date/Time   CHOL 187 07/02/2020 1101   TRIG 87 07/02/2020 1101   HDL 53 07/02/2020 1101   CHOLHDL 3.4 11/08/2017 1118   LDLCALC 118 (H) 07/02/2020 1101  ASCVD risk enhancing conditions: age 46 HTN, current smoker Unable to independently Manage HLD  Lacks social connections Does not contact provider office for [employer] Homeless  [place]):  patient will work with Consulting civil engineer, providers, and care team towards execution of optimized self-health management plan patient will verbalize  understanding of plan for effective management of HLD patient will work with RNCM, LCSW, and pcp to address needs related to HLD and other factors impacting the patients health and well being such as homeless situation and the patient currently living with his cousin patient will take all medications exactly as prescribed and will call provider for medication related questions patient will work with CM clinical social worker to help with housing resources and recommendations for stable housing patient will work with care guides (community agency) to assist with housing resources in [place] the patient will demonstrate ongoing self health care management ability Interventions: Collaboration with Valerie Roys, DO regarding development and update of comprehensive plan of care as evidenced by provider attestation and co-signature Inter-disciplinary care team collaboration (see longitudinal plan of care) Medication review performed; medication list updated in electronic medical record.  Inter-disciplinary care team collaboration (see longitudinal plan of care) Referred to pharmacy team for assistance with HLD medication management Evaluation of current treatment plan related to HLD and patient's adherence to plan as established by provider. Advised patient to call the office for changes, questions or new concerns Provided education to patient re: eating a heart healthy diet, pacing activity, smoking cessation, and working with the CCM team to effectively manage chronic condtions  Reviewed medications with patient and discussed compliance. The patient states he has his medications and is compliant with taking his medications  Care Guide referral for help with housing resources in [place]. The patient has been living with his cousin for a while. His wife is in the nursing home and is ready to come home but he has nowhere for them to go. Is receptive to help from the care guides and  LCSW Social Work referral for assistance and recommendations of stable housing.  Discussed plans with patient for ongoing care management follow up and provided patient with direct contact information for care management team Patient Goals/Self-Care Activities: - call for medicine refill 2 or 3 days before it runs out - call if I am sick and can't take my medicine - keep a list of all the medicines I take; vitamins and herbals too - learn to read medicine labels - use a pillbox to sort medicine - use an alarm clock or phone to remind me to take my medicine - change to whole grain breads, cereal, pasta - drink 6 to 8 glasses of water each day - eat 3 to 5 servings of fruits and vegetables each day - eat 5 or 6 small meals each day - eat fish at least once per week - fill half the plate with nonstarchy vegetables - limit fast food meals to no more than 1 per week - manage portion size - prepare main meal at home 3 to 5 days each week - read food labels for fat, fiber, carbohydrates and portion size - reduce red meat to 2 to 3 times a week - be open to making changes - I can manage, know and watch for signs of a heart attack - if I have chest pain, call for help - learn about small changes that will make a big difference - learn my personal  risk factors  Follow Up Plan: Telephone follow up appointment with care management team member scheduled for: 01-06-2021 at 1145 am       Task: RNCM: HLD management Completed 10/29/2020  Outcome: Positive  Note:   Care Management Activities:    - education plan reviewed and/or amended - empathy and reassurance conveyed - family/caregiver participation in learning encouraged - health literacy screen reviewed - patient's preferred learning methods utilized - questions encouraged - readiness to learn monitored        Care Plan : RNCM: Hypertension (Adult)  Updates made by Vanita Ingles since 10/29/2020 12:00 AM     Problem: RNCM:  Hypertension (Hypertension)   Priority: Medium     Long-Range Goal: RNCM: Hypertension Monitored   Start Date: 04/23/2020  Expected End Date: 10/24/2021  This Visit's Progress: On track  Priority: Medium  Note:   Objective:  Last practice recorded BP readings:  BP Readings from Last 3 Encounters:  08/13/20 114/72  07/02/20 104/65  05/21/20 126/77   Most recent eGFR/CrCl:  Lab Results  Component Value Date   EGFR 80 07/02/2020    No components found for: CRCL Current Barriers:  Knowledge Deficits related to basic understanding of hypertension pathophysiology and self care management Knowledge Deficits related to understanding of medications prescribed for management of hypertension Limited Social Designer, multimedia.  Unable to independently manage HTN Lacks social connections homeless Case Manager Clinical Goal(s):  patient will verbalize understanding of plan for hypertension management patient will demonstrate improved adherence to prescribed treatment plan for hypertension as evidenced by taking all medications as prescribed, monitoring and recording blood pressure as directed, adhering to low sodium/DASH diet patient will demonstrate improved health management independence as evidenced by checking blood pressure as directed and notifying PCP if SBP>150 or DBP > 90, taking all medications as prescribe, and adhering to a low sodium diet as discussed. patient will verbalize basic understanding of hypertension disease process and self health management plan as evidenced by compliance with heart healthy diet, compliance with medications and working with the CCM team to effectively manage health and well being.  Interventions:  Collaboration with Valerie Roys, DO regarding development and update of comprehensive plan of care as evidenced by provider attestation and co-signature Inter-disciplinary care team collaboration (see longitudinal plan of care) UNABLE to  independently:manage HTN Evaluation of current treatment plan related to hypertension self management and patient's adherence to plan as established by provider. Provided education to patient re: stroke prevention, s/s of heart attack and stroke, DASH diet, complications of uncontrolled blood pressure Reviewed medications with patient and discussed importance of compliance Discussed plans with patient for ongoing care management follow up and provided patient with direct contact information for care management team Advised patient, providing education and rationale, to monitor blood pressure daily and record, calling PCP for findings outside established parameters.  Self-Care Activities: - Self administers medications as prescribed Attends all scheduled provider appointments Calls provider office for new concerns, questions, or BP outside discussed parameters Checks BP and records as discussed Follows a low sodium diet/DASH diet Patient Goals: - check blood pressure weekly - choose a place to take my blood pressure (home, clinic or office, retail store) - agree on reward when goals are met - agree to work together to make changes - ask questions to understand - have a family meeting to talk about healthy habits - learn about high blood pressure  Follow Up Plan: Telephone follow up appointment with care management team  member scheduled for: 01-06-2021 at 1145 am    Task: RNCM: Identify and Monitor Blood Pressure Elevation Completed 10/29/2020  Outcome: Positive  Note:   Care Management Activities:    - blood pressure trends reviewed - depression screen reviewed - home or ambulatory blood pressure monitoring encouraged         Plan:Telephone follow up appointment with care management team member scheduled for:  01-06-2021 at 1145 am  [place], MSN, [facility] Mobile: [phone]

## 2020-10-29 NOTE — Patient Instructions (Signed)
Visit Information  PATIENT GOALS:  Goals Addressed             This Visit's Progress    RNCM: Keep Myself Safe-Substance Misuse       Follow Up Date 01/06/2021    - eat healthy - exercise more - keep emergency contact information on me - learn how to use public transportation - use public transportation    Why is this important?   Even if you are not ready to quit using alcohol or drugs, there are things you can do to stay safe.  You can stop the spread of HIV (human immunodeficiency virus) by practicing safe sex.  You can stop the spread of hepatitis or HIV by not sharing needles.  Learning CPR (cardiopulmonary resuscitation) and how to call for help may save you or a friend.     Notes: 10-29-2020: The patient is active and denies illegal drug use. Does smoke. Patient is homeless. He is living with his cousin and his wife is in a nursing home. He wants to be able to find a place of his own so his wife can come home. Has been homeless for a while. Will do a care guide referral for help with housing in [facility] and also consult and collaborate with the LCSW for recommendations.      RNCM: Stop or Cut Down Tobacco Use       Timeframe:  Long-Range Goal Priority:  High Start Date:    10-29-2020                         Expected End Date:      10-29-2021                 Follow Up Date 01/06/2021    - change or avoid triggers like smoky places, drinking alcohol and other smokers - cut down amount of tobacco product used (chew, cigars) - cut down number of cigarettes by one-half - drink 4-6 glasses of water each day - use over-the-counter gum, patch or lozenges - use prescription medicine to decrease cravings and withdrawal - use prescription nicotine replacement - use Quit Line 1-800-Quit Now    Why is this important?   To stop or cut down it is important to have support from a person or group of people who you can count on.  You will also need to think about the things that  make you feel like smoking, then plan for how to handle them.    Notes: 10-29-2020: The patient is not interested in smoking cessation at this time. Will continue to monitor.         The patient verbalized understanding of instructions, educational materials, and care plan provided today and declined offer to receive copy of patient instructions, educational materials, and care plan.   Telephone follow up appointment with care management team member scheduled for: 01-06-2021 at 1145 am [place], MSN, [facility] Mobile: [phone]

## 2020-10-30 NOTE — Progress Notes (Signed)
Patient has been scheduled with LCSW  

## 2020-11-03 ENCOUNTER — Ambulatory Visit (INDEPENDENT_AMBULATORY_CARE_PROVIDER_SITE_OTHER): Payer: Medicare HMO | Admitting: Licensed Clinical Social Worker

## 2020-11-03 DIAGNOSIS — Z5900 Homelessness unspecified: Secondary | ICD-10-CM

## 2020-11-03 DIAGNOSIS — E0590 Thyrotoxicosis, unspecified without thyrotoxic crisis or storm: Secondary | ICD-10-CM

## 2020-11-03 DIAGNOSIS — I10 Essential (primary) hypertension: Secondary | ICD-10-CM

## 2020-11-03 NOTE — Chronic Care Management (AMB) (Signed)
    Clinical Social Work  Care Management   Phone Outreach    11/03/2020 Name: Tyler Cohen MRN: [id number] DOB: 1956-04-01  [name] is a 65 y.o. year old male who is a primary care patient of Valerie Roys, DO .   CCM LCSW reached out to patient today by phone to introduce self, assess needs and offer Care Management services and interventions.    Unable to keep phone appointment today and requested to reschedule.  Plan:Appointment was rescheduled with CCM LCSW for 11/13/20  Review of patient status, including review of consultants reports, relevant laboratory and other test results, and collaboration with appropriate care team members and the patient's provider was performed as part of comprehensive patient evaluation and provision of care management services.     Christa See, MSW, [email or web address] Phone [phone] 9:08 AM

## 2020-11-13 ENCOUNTER — Ambulatory Visit: Payer: Medicare HMO | Admitting: Licensed Clinical Social Worker

## 2020-11-13 DIAGNOSIS — Z5900 Homelessness unspecified: Secondary | ICD-10-CM

## 2020-11-13 DIAGNOSIS — E7800 Pure hypercholesterolemia, unspecified: Secondary | ICD-10-CM

## 2020-11-13 DIAGNOSIS — Z599 Problem related to housing and economic circumstances, unspecified: Secondary | ICD-10-CM

## 2020-11-13 DIAGNOSIS — Z59819 Housing instability, housed unspecified: Secondary | ICD-10-CM

## 2020-11-13 DIAGNOSIS — I10 Essential (primary) hypertension: Secondary | ICD-10-CM

## 2020-11-14 NOTE — Patient Instructions (Signed)
Visit Information   Goals Addressed             This Visit's Progress    Obtain independent housing   On track    Timeframe:  Long-Range Goal Priority:  High Start Date:      11/13/20                       Expected End Date:  02/02/21                     Follow Up Date 12/05/20    Patient Goals/Self-Care Activities: Over the next 120 days Follow up with previous referral to the care guide for housing resources, they will follow up with you  Contact clinic with any questions or concerns Utilize resources discussed        Patient verbalizes understanding of instructions provided today.   Telephone follow up appointment with care management team member scheduled for:12/05/20  Christa See, MSW, [email or web address] Phone [phone] 1:44 PM

## 2020-11-14 NOTE — Chronic Care Management (AMB) (Signed)
Chronic Care Management    Clinical Social Work Note  11/14/2020 Name: Tyler Cohen MRN: [id number] DOB: 1955-09-09  Tyler Cohen is a 65 y.o. year old male who is a primary care patient of Valerie Roys, DO. The CCM team was consulted to assist the patient with chronic disease management and/or care coordination needs related to: [employer] .   Engaged with patient by telephone for initial visit in response to provider referral for social work chronic care management and care coordination services.   Consent to Services:  The patient was given the following information about Chronic Care Management services today, agreed to services, and gave verbal consent: 1. CCM service includes personalized support from designated clinical staff supervised by the primary care provider, including individualized plan of care and coordination with other care providers 2. 24/7 contact phone numbers for assistance for urgent and routine care needs. 3. Service will only be billed when office clinical staff spend 20 minutes or more in a month to coordinate care. 4. Only one practitioner may furnish and bill the service in a calendar month. 5.The patient may stop CCM services at any time (effective at the end of the month) by phone call to the office staff. 6. The patient will be responsible for cost sharing (co-pay) of up to 20% of the service fee (after annual deductible is met). Patient agreed to services and consent obtained.  Patient agreed to services and consent obtained.   Consent to Services:  The patient was given information about Care Management services, agreed to services, and gave verbal consent prior to initiation of services.  Please see initial visit note for detailed documentation.   Patient agreed to services today and consent obtained.   Assessment: Engaged with patient by telephone in response to provider referral for social work care coordination services: [employer]  .    Patient is currently experiencing difficulty with obtaining independent housing. He currently resides with cousin and reports no current safety concerns. CCM LCSW discussed supportive resources and mailed them to residence on file. See Care Plan below for interventions and patient self-care actives.  Recent life changes or stressors: Housing  Recommendation: Patient may benefit from, and is in agreement work with LCSW to address care coordination needs and will continue to work with the clinical team to address health care and disease management related needs.   Follow up Plan: Patient would like continued follow-up from CCM LCSW .  per patient's request will follow up in 12/05/20. Patient will call office if needed prior to next encounter.     SDOH (Social Determinants of Health) assessments and interventions performed:  SDOH Interventions    Flowsheet Row Most Recent Value  SDOH Interventions   Food Insecurity Interventions Intervention Not Indicated  Housing Interventions Other (Comment)  [Discussed resources to assist with independent housing]  Transportation Interventions Intervention Not Indicated        Advanced Directives Status: Not addressed in this encounter.  CCM Care Plan  No Known Allergies  Outpatient Encounter Medications as of 11/13/2020  Medication Sig   albuterol (VENTOLIN HFA) 108 (90 Base) MCG/ACT inhaler INHALE 2 PUFFS INTO THE LUNGS EVERY 6 HOURS AS NEEDED FOR WHEEZING OR SHORTNESS OF BREATH   aspirin 81 MG tablet Take 81 mg by mouth daily.   atorvastatin (LIPITOR) 20 MG tablet TAKE 1 TABLET(20 MG) BY MOUTH AT BEDTIME   b complex vitamins tablet Take 1 tablet by mouth daily.   clobetasol ointment (TEMOVATE) 0.05 %  Apply 1 application topically 2 (two) times daily.   clotrimazole-betamethasone (LOTRISONE) cream Apply 1 application topically 2 (two) times daily.   lidocaine (XYLOCAINE) 5 % ointment Apply 1 application topically as needed.   methimazole  (TAPAZOLE) 10 MG tablet Take 2 tablets (20 mg total) by mouth 2 (two) times daily.   propylthiouracil (PTU) 50 MG tablet TAKE 2 TABLETS(100 MG) BY MOUTH THREE TIMES DAILY   tamsulosin (FLOMAX) 0.4 MG CAPS capsule TAKE 1 CAPSULE(0.4 MG) BY MOUTH DAILY   No facility-administered encounter medications on file as of 11/13/2020.    Patient Active Problem List   Diagnosis Date Noted   Aortic atherosclerosis ([place]) 06/25/2020   BPH (benign prostatic hyperplasia) 10/27/2018   Multinodular goiter 12/08/2017   Hyperthyroidism 12/08/2017   Left thyroid nodule 05/04/2017   Adrenal mass ([place]) 05/04/2017   Renal mass 01/17/2017   Liver lesion 11/29/2016   Advanced care planning/counseling discussion 11/02/2016   Digital mucinous cyst of finger of right hand 11/02/2016   Benign neoplasm of sigmoid colon    Psoriasis 09/03/2015   Tobacco abuse    Macrocytosis    Hyperlipidemia 08/29/2014   Dental disease 08/29/2014    Conditions to be addressed/monitored:  Housing barriers  Care Plan : General Social Work (Adult)  Updates made by Christa See D, LCSW since 11/14/2020 12:00 AM     Problem: Need for Independent Housing      Goal: Barriers to Treatment Identified and Managed   Start Date: 11/13/2020  This Visit's Progress: On track  Priority: High  Note:   Current barriers:    Housing barriers Clinical Goals: Patient will work with LCSW to address needs related to obtaining independent housing Clinical Interventions:  Patient is interested in obtaining independent housing. He currently resides with a cousin and wife is in a nursing home Per chart review, a care guide referral was placed to assist with housing resources Patient shared financial strain from being on a fixed income. He would like a unit that is on the first floor and under $400 within [place]. He receives SNAP benefits Patient denies barriers with literacy CCM LCSW discussed supportive resources to assist. LCSW will  mail resources discussed to address on file Patient denies any additional stressors or mental health needs 1:1 collaboration with primary care provider regarding development and update of comprehensive plan of care as evidenced by provider attestation and co-signature Inter-disciplinary care team collaboration (see longitudinal plan of care) Assessment of needs, barriers , agencies contacted, as well as how impacting Review various resources, discussed options and provided patient information about  Housing resources ([employer]) Solution-Focused Strategies, Active listening / Reflection utilized , Emotional Supportive Provided, and Verbalization of feelings encouraged  Patient Goals/Self-Care Activities: Over the next 120 days Follow up with previous referral to the care guide for housing resources, they will follow up with you  Contact clinic with any questions or concerns Utilize resources discussed       Christa See, MSW, [email or web address] Phone [phone] 1:42 PM

## 2020-12-03 DIAGNOSIS — I10 Essential (primary) hypertension: Secondary | ICD-10-CM | POA: Diagnosis not present

## 2020-12-03 DIAGNOSIS — E7800 Pure hypercholesterolemia, unspecified: Secondary | ICD-10-CM

## 2020-12-05 ENCOUNTER — Ambulatory Visit: Payer: Medicare HMO | Admitting: Licensed Clinical Social Worker

## 2020-12-05 DIAGNOSIS — Z599 Problem related to housing and economic circumstances, unspecified: Secondary | ICD-10-CM

## 2020-12-05 DIAGNOSIS — Z59819 Housing instability, housed unspecified: Secondary | ICD-10-CM

## 2020-12-05 DIAGNOSIS — Z5900 Homelessness unspecified: Secondary | ICD-10-CM

## 2020-12-05 DIAGNOSIS — I10 Essential (primary) hypertension: Secondary | ICD-10-CM

## 2020-12-05 NOTE — Chronic Care Management (AMB) (Signed)
    Clinical Social Work  Care Management   Phone Outreach    12/05/2020 Name: Tyler Cohen MRN: [id number] DOB: 1955/05/04  Tyler Cohen is a 65 y.o. year old male who is a primary care patient of Valerie Roys, DO .   Reason for referral: [employer] .    F/U phone call today to assess needs, progress and barriers with care plan goals.   Unable to keep phone appointment today and requested to reschedule.  Plan:Appointment was rescheduled with CCM LCSW  Review of patient status, including review of consultants reports, relevant laboratory and other test results, and collaboration with appropriate care team members and the patient's provider was performed as part of comprehensive patient evaluation and provision of care management services.     Christa See, MSW, [email or web address] Phone [phone] 4:08 PM

## 2020-12-12 ENCOUNTER — Telehealth: Payer: Medicare HMO

## 2020-12-15 ENCOUNTER — Telehealth: Payer: Self-pay | Admitting: Licensed Clinical Social Worker

## 2020-12-15 NOTE — Telephone Encounter (Signed)
    Clinical Social Work  Chronic Care Management   Phone Outreach    12/15/2020 Name: Tyler Cohen MRN: [id number] DOB: 1955/07/06  [name] is a 65 y.o. year old male who is a primary care patient of Valerie Roys, DO .   Reason for referral: [employer] .    F/U phone call today to assess needs, progress and barriers with care plan goals.   Telephone outreach was unsuccessful. A HIPPA compliant phone message was left for the patient providing contact information and requesting a return call.   Plan:CCM LCSW will wait for return call. If no return call is received, CCM LCSW will reschedule appointment  Review of patient status, including review of consultants reports, relevant laboratory and other test results, and collaboration with appropriate care team members and the patient's provider was performed as part of comprehensive patient evaluation and provision of care management services.     Christa See, MSW, [email or web address] Phone [phone] 11:20 AM

## 2021-01-05 ENCOUNTER — Telehealth: Payer: Self-pay | Admitting: Licensed Clinical Social Worker

## 2021-01-05 ENCOUNTER — Telehealth: Payer: Medicare HMO

## 2021-01-05 NOTE — Telephone Encounter (Signed)
    Clinical Social Work  Care Management   Phone Outreach    01/05/2021 Name: NOLYN SWAB MRN: [id number] DOB: 28-Nov-1955  [name] is a 65 y.o. year old male who is a primary care patient of Valerie Roys, DO .   Reason for referral: [employer] .    F/U phone call today to assess needs, progress and barriers with care plan goals.   2nd unsuccessful telephone outreach attempt.  If unable to reach patient by phone on the 3rd attempt, will discontinue outreach calls but will be available at any time to provide services.   Plan:CCM LCSW will wait for return call. If no return call is received, Will route chart to Care Guide to see if patient would like to reschedule phone appointment   Review of patient status, including review of consultants reports, relevant laboratory and other test results, and collaboration with appropriate care team members and the patient's provider was performed as part of comprehensive patient evaluation and provision of care management services.    Christa See, MSW, [email or web address] Phone [phone] 4:07 PM

## 2021-01-06 ENCOUNTER — Telehealth: Payer: Medicare HMO

## 2021-01-06 ENCOUNTER — Telehealth: Payer: Self-pay

## 2021-01-06 NOTE — Telephone Encounter (Signed)
  Care Management   Follow Up Note   01/06/2021 Name: Tyler Cohen MRN: [id number] DOB: [birth year]   Referred by: Valerie Roys, DO Reason for referral : Chronic Care Management (RNCM: Follow up for Chronic Disease Management and Care Coordination Needs )   An unsuccessful telephone outreach was attempted today. The patient was referred to the case management team for assistance with care management and care coordination.   Follow Up Plan: A HIPPA compliant phone message was left for the patient providing contact information and requesting a return call.   Noreene Larsson RN, MSN, [facility] Mobile: [phone]

## 2021-01-07 ENCOUNTER — Ambulatory Visit: Payer: Medicare PPO

## 2021-01-07 ENCOUNTER — Telehealth: Payer: Medicare HMO | Admitting: General Practice

## 2021-01-07 ENCOUNTER — Ambulatory Visit (INDEPENDENT_AMBULATORY_CARE_PROVIDER_SITE_OTHER): Payer: Medicare HMO

## 2021-01-07 DIAGNOSIS — E7800 Pure hypercholesterolemia, unspecified: Secondary | ICD-10-CM

## 2021-01-07 DIAGNOSIS — I10 Essential (primary) hypertension: Secondary | ICD-10-CM

## 2021-01-07 DIAGNOSIS — Z720 Tobacco use: Secondary | ICD-10-CM

## 2021-01-07 NOTE — Chronic Care Management (AMB) (Signed)
Chronic Care Management   CCM RN Visit Note  01/07/2021 Name: Tyler Cohen MRN: [id number] DOB: 02-25-1956  Subjective: Tyler Cohen is a 65 y.o. year old male who is a primary care patient of Valerie Roys, DO. The care management team was consulted for assistance with disease management and care coordination needs.    Engaged with patient by telephone for follow up visit in response to provider referral for case management and/or care coordination services.   Consent to Services:  The patient was given information about Chronic Care Management services, agreed to services, and gave verbal consent prior to initiation of services.  Please see initial visit note for detailed documentation.   Patient agreed to services and verbal consent obtained.   Assessment: Review of patient past medical history, allergies, medications, health status, including review of consultants reports, laboratory and other test data, was performed as part of comprehensive evaluation and provision of chronic care management services.   SDOH (Social Determinants of Health) assessments and interventions performed:  SDOH Interventions    Flowsheet Row Most Recent Value  SDOH Interventions   Food Insecurity Interventions Intervention Not Indicated  Financial Strain Interventions Other (Comment)  [the patient has resources, he has been looking for stable housing for him and his wife but has not found any to meet his needs and that he can afford. Working with LCSW to assist with affordable housing]  Intimate Partner Violence Interventions Intervention Not Indicated  Physical Activity Interventions Other (Comments)  [rides bike when weather permits, is active]  Social Connections Interventions Intervention Not Indicated  Transportation Interventions Intervention Not Indicated        CCM Care Plan  No Known Allergies  Outpatient Encounter Medications as of 01/07/2021  Medication Sig   albuterol (VENTOLIN  HFA) 108 (90 Base) MCG/ACT inhaler INHALE 2 PUFFS INTO THE LUNGS EVERY 6 HOURS AS NEEDED FOR WHEEZING OR SHORTNESS OF BREATH   aspirin 81 MG tablet Take 81 mg by mouth daily.   atorvastatin (LIPITOR) 20 MG tablet TAKE 1 TABLET(20 MG) BY MOUTH AT BEDTIME   b complex vitamins tablet Take 1 tablet by mouth daily.   clobetasol ointment (TEMOVATE) 6.16 % Apply 1 application topically 2 (two) times daily.   clotrimazole-betamethasone (LOTRISONE) cream Apply 1 application topically 2 (two) times daily.   lidocaine (XYLOCAINE) 5 % ointment Apply 1 application topically as needed.   methimazole (TAPAZOLE) 10 MG tablet Take 2 tablets (20 mg total) by mouth 2 (two) times daily.   propylthiouracil (PTU) 50 MG tablet TAKE 2 TABLETS(100 MG) BY MOUTH THREE TIMES DAILY   tamsulosin (FLOMAX) 0.4 MG CAPS capsule TAKE 1 CAPSULE(0.4 MG) BY MOUTH DAILY   No facility-administered encounter medications on file as of 01/07/2021.    Patient Active Problem List   Diagnosis Date Noted   Aortic atherosclerosis ([place]) 06/25/2020   BPH (benign prostatic hyperplasia) 10/27/2018   Multinodular goiter 12/08/2017   Hyperthyroidism 12/08/2017   Left thyroid nodule 05/04/2017   Adrenal mass ([place]) 05/04/2017   Renal mass 01/17/2017   Liver lesion 11/29/2016   Advanced care planning/counseling discussion 11/02/2016   Digital mucinous cyst of finger of right hand 11/02/2016   Benign neoplasm of sigmoid colon    Psoriasis 09/03/2015   Tobacco abuse    Macrocytosis    Hyperlipidemia 08/29/2014   Dental disease 08/29/2014    Conditions to be addressed/monitored:HTN, HLD, and Smoker  Care Plan : RNCM: HLD Management  Updates made by Vanita Ingles, RN  since 01/07/2021 12:00 AM     Problem: RNCM: Management of HLD   Priority: Medium     Long-Range Goal: RNCM: HLD Management   Start Date: 04/23/2020  This Visit's Progress: On track  Recent Progress: On track  Priority: Medium  Note:   Current Barriers:  Poorly  controlled hyperlipidemia, complicated by smoking, homelessness, HTN Current antihyperlipidemic regimen: Lipitor 20 mg  Most recent lipid panel:     Component Value Date/Time   CHOL 187 07/02/2020 1101   TRIG 87 07/02/2020 1101   HDL 53 07/02/2020 1101   CHOLHDL 3.4 11/08/2017 1118   LDLCALC 118 (H) 07/02/2020 1101   ASCVD risk enhancing conditions: age 56 HTN, current smoker Unable to independently Manage HLD  Lacks social connections Does not contact provider office for [employer] Homeless  [place]):  patient will work with Consulting civil engineer, providers, and care team towards execution of optimized self-health management plan patient will verbalize understanding of plan for effective management of HLD patient will work with RNCM, LCSW, and pcp to address needs related to HLD and other factors impacting the patients health and well being such as homeless situation and the patient currently living with his cousin patient will take all medications exactly as prescribed and will call provider for medication related questions patient will work with CM clinical social worker to help with housing resources and recommendations for stable housing patient will work with care guides (community agency) to assist with housing resources in [place] the patient will demonstrate ongoing self health care management ability Interventions: Collaboration with Valerie Roys, DO regarding development and update of comprehensive plan of care as evidenced by provider attestation and co-signature Inter-disciplinary care team collaboration (see longitudinal plan of care) Medication review performed; medication list updated in electronic medical record. 01-07-2021: The patient states that he is compliant with his medications, no concerns with medications Inter-disciplinary care team collaboration (see longitudinal plan of care) Referred to pharmacy team for assistance with  HLD medication management Evaluation of current treatment plan related to HLD and patient's adherence to plan as established by provider. 01-07-2021: Denies any changes in his HLD health. States he is doing well. Will continue to monitor.  Advised patient to call the office for changes, questions or new concerns Provided education to patient re: eating a heart healthy diet, pacing activity, smoking cessation, and working with the CCM team to effectively manage chronic condtions  Reviewed medications with patient and discussed compliance. 01-07-2021: The patient states he has his medications and is compliant with taking his medications  Care Guide referral for help with housing resources in [place]. The patient has been living with his cousin for a while. His wife is in the nursing home and is ready to come home but he has nowhere for them to go. Is receptive to help from the care guides and LCSW. 01-07-2021: The patient continues to work with the [facility]. Social Work referral for assistance and recommendations of stable housing. 01-07-2021: The patient continues to work with LCSW Discussed plans with patient for ongoing care management follow up and provided patient with direct contact information for care management team Patient Goals/Self-Care Activities: - call for medicine refill 2 or 3 days before it runs out - call if I am sick and can't take my medicine - keep a list of all the medicines I take; vitamins and herbals too - learn to read medicine labels - use a pillbox to sort medicine - use  an alarm clock or phone to remind me to take my medicine - change to whole grain breads, cereal, pasta - drink 6 to 8 glasses of water each day - eat 3 to 5 servings of fruits and vegetables each day - eat 5 or 6 small meals each day - eat fish at least once per week - fill half the plate with nonstarchy vegetables - limit fast food meals to no more than 1 per week - manage portion size - prepare  main meal at home 3 to 5 days each week - read food labels for fat, fiber, carbohydrates and portion size - reduce red meat to 2 to 3 times a week - be open to making changes - I can manage, know and watch for signs of a heart attack - if I have chest pain, call for help - learn about small changes that will make a big difference - learn my personal risk factors  Follow Up Plan: Telephone follow up appointment with care management team member scheduled for: 03-10-2021 at 1145 am       Care Plan : RNCM: Hypertension (Adult)  Updates made by Vanita Ingles, RN since 01/07/2021 12:00 AM     Problem: RNCM: Hypertension (Hypertension)   Priority: Medium     Long-Range Goal: RNCM: Hypertension Monitored   Start Date: 04/23/2020  Expected End Date: 10/24/2021  This Visit's Progress: On track  Recent Progress: On track  Priority: Medium  Note:   Objective:  Last practice recorded BP readings:  BP Readings from Last 3 Encounters:  08/13/20 114/72  07/02/20 104/65  05/21/20 126/77   Most recent eGFR/CrCl:  Lab Results  Component Value Date   EGFR 80 07/02/2020    No components found for: CRCL Current Barriers:  Knowledge Deficits related to basic understanding of hypertension pathophysiology and self care management Knowledge Deficits related to understanding of medications prescribed for management of hypertension Limited Social Designer, multimedia.  Unable to independently manage HTN Lacks social connections homeless Case Manager Clinical Goal(s):  patient will verbalize understanding of plan for hypertension management patient will demonstrate improved adherence to prescribed treatment plan for hypertension as evidenced by taking all medications as prescribed, monitoring and recording blood pressure as directed, adhering to low sodium/DASH diet patient will demonstrate improved health management independence as evidenced by checking blood pressure as directed and  notifying PCP if SBP>150 or DBP > 90, taking all medications as prescribe, and adhering to a low sodium diet as discussed. patient will verbalize basic understanding of hypertension disease process and self health management plan as evidenced by compliance with heart healthy diet, compliance with medications and working with the CCM team to effectively manage health and well being.  Interventions:  Collaboration with Valerie Roys, DO regarding development and update of comprehensive plan of care as evidenced by provider attestation and co-signature Inter-disciplinary care team collaboration (see longitudinal plan of care) UNABLE to independently:manage HTN Evaluation of current treatment plan related to hypertension self management and patient's adherence to plan as established by provider. 01-07-2021: The patient states that he is doing well and denies any issues with his HTN. States he is eating well and sleeping well. Will continue to monitor. Provided education to patient re: stroke prevention, s/s of heart attack and stroke, DASH diet, complications of uncontrolled blood pressure Reviewed medications with patient and discussed importance of compliance. 01-07-2021: The patient states compliance with medications Discussed plans with patient for ongoing care management follow up and  provided patient with direct contact information for care management team Advised patient, providing education and rationale, to monitor blood pressure daily and record, calling PCP for findings outside established parameters.  Self-Care Activities: - Self administers medications as prescribed Attends all scheduled provider appointments Calls provider office for new concerns, questions, or BP outside discussed parameters Checks BP and records as discussed Follows a low sodium diet/DASH diet Patient Goals: - check blood pressure weekly - choose a place to take my blood pressure (home, clinic or office, retail  store) - agree on reward when goals are met - agree to work together to make changes - ask questions to understand - have a family meeting to talk about healthy habits - learn about high blood pressure  Follow Up Plan: Telephone follow up appointment with care management team member scheduled for: 03-10-2021 at 1145 am     Plan:Telephone follow up appointment with care management team member scheduled for:  03-10-2021 at 1145 am  Noreene Larsson RN, MSN, [facility] Mobile: [phone]

## 2021-01-07 NOTE — Patient Instructions (Signed)
Visit Information  PATIENT GOALS:  Goals Addressed             This Visit's Progress    RNCM: Keep Myself Safe-Substance Misuse       Follow Up Date 03/10/2021    - eat healthy - exercise more - keep emergency contact information on me - learn how to use public transportation - use public transportation    Why is this important?   Even if you are not ready to quit using alcohol or drugs, there are things you can do to stay safe.  You can stop the spread of HIV (human immunodeficiency virus) by practicing safe sex.  You can stop the spread of hepatitis or HIV by not sharing needles.  Learning CPR (cardiopulmonary resuscitation) and how to call for help may save you or a friend.     Notes: 10-29-2020: The patient is active and denies illegal drug use. Does smoke. Patient is homeless. He is living with his cousin and his wife is in a nursing home. He wants to be able to find a place of his own so his wife can come home. Has been homeless for a while. Will do a care guide referral for help with housing in [facility] and also consult and collaborate with the LCSW for recommendations. 01-07-2021: The patient states that he is still needing housing. Has resources. Will continue to monitor.     RNCM: Stop or Cut Down Tobacco Use       Timeframe:  Long-Range Goal Priority:  High Start Date:    10-29-2020                         Expected End Date:      10-29-2021                 Follow Up Date 03/10/2021    - change or avoid triggers like smoky places, drinking alcohol and other smokers - cut down amount of tobacco product used (chew, cigars) - cut down number of cigarettes by one-half - drink 4-6 glasses of water each day - use over-the-counter gum, patch or lozenges - use prescription medicine to decrease cravings and withdrawal - use prescription nicotine replacement - use Quit Line 1-800-Quit Now    Why is this important?   To stop or cut down it is important to have support  from a person or group of people who you can count on.  You will also need to think about the things that make you feel like smoking, then plan for how to handle them.    Notes: 03-10-2021: The patient is not interested in smoking cessation at this time. Will continue to monitor.         The patient verbalized understanding of instructions, educational materials, and care plan provided today and declined offer to receive copy of patient instructions, educational materials, and care plan.   Telephone follow up appointment with care management team member scheduled for: 03-10-2021 at 1145 am  [place], MSN, [facility] Mobile: [phone]

## 2021-01-09 ENCOUNTER — Ambulatory Visit (INDEPENDENT_AMBULATORY_CARE_PROVIDER_SITE_OTHER): Payer: Medicare HMO

## 2021-01-09 VITALS — Ht 73.0 in | Wt 155.0 lb

## 2021-01-09 DIAGNOSIS — Z0000 Encounter for general adult medical examination without abnormal findings: Secondary | ICD-10-CM

## 2021-01-09 NOTE — Progress Notes (Signed)
I connected with Tyler Cohen today by telephone and verified that I am speaking with the correct person using two identifiers. Location patient: home Location provider: work Persons participating in the virtual visit: Tyler Cohen, [name] LPN.   I discussed the limitations, risks, security and privacy concerns of performing an evaluation and management service by telephone and the availability of in person appointments. I also discussed with the patient that there may be a patient responsible charge related to this service. The patient expressed understanding and verbally consented to this telephonic visit.    Interactive audio and video telecommunications were attempted between this provider and patient, however failed, due to patient having technical difficulties OR patient did not have access to video capability.  We continued and completed visit with audio only.     Vital signs may be patient reported or missing.  Subjective:   Tyler Cohen is a 65 y.o. male who presents for Medicare Annual/Subsequent preventive examination.  Review of Systems     Cardiac Risk Factors include: advanced age (>5men, >41 women);dyslipidemia;male gender;smoking/ tobacco exposure     Objective:    Today's Vitals   01/09/21 0946  Weight: 155 lb (70.3 kg)  Height: 6\' 1"  (1.854 m)   Body mass index is 20.45 kg/m.  Advanced Directives 01/09/2021 01/07/2020 04/30/2018 11/07/2017 10/14/2016 04/09/2016 12/01/2015  Does Patient Have a Medical Advance Directive? Yes No No No No No No  Type of Paramedic of [place];Living will - - - - - -  Does patient want to make changes to medical advance directive? - - - - Yes (MAU/Ambulatory/Procedural Areas - Information given) - -  Copy of [place] in Chart? No - copy requested - - - - - -  Would patient like information on creating a medical advance directive? - - - Yes (MAU/Ambulatory/Procedural Areas - Information  given) - - Yes - Educational materials given    Current Medications (verified) Outpatient Encounter Medications as of 01/09/2021  Medication Sig   albuterol (VENTOLIN HFA) 108 (90 Base) MCG/ACT inhaler INHALE 2 PUFFS INTO THE LUNGS EVERY 6 HOURS AS NEEDED FOR WHEEZING OR SHORTNESS OF BREATH   aspirin 81 MG tablet Take 81 mg by mouth daily.   atorvastatin (LIPITOR) 20 MG tablet TAKE 1 TABLET(20 MG) BY MOUTH AT BEDTIME   b complex vitamins tablet Take 1 tablet by mouth daily.   clobetasol ointment (TEMOVATE) 2.99 % Apply 1 application topically 2 (two) times daily.   clotrimazole-betamethasone (LOTRISONE) cream Apply 1 application topically 2 (two) times daily.   lidocaine (XYLOCAINE) 5 % ointment Apply 1 application topically as needed.   methimazole (TAPAZOLE) 10 MG tablet Take 2 tablets (20 mg total) by mouth 2 (two) times daily.   tamsulosin (FLOMAX) 0.4 MG CAPS capsule TAKE 1 CAPSULE(0.4 MG) BY MOUTH DAILY   propylthiouracil (PTU) 50 MG tablet TAKE 2 TABLETS(100 MG) BY MOUTH THREE TIMES DAILY (Patient not taking: Reported on 01/09/2021)   No facility-administered encounter medications on file as of 01/09/2021.    Allergies (verified) Patient has no known allergies.   History: Past Medical History:  Diagnosis Date   Arthritis    Heart murmur    Hyperlipidemia    Macrocytosis    Thyroid disease    Tobacco abuse    Past Surgical History:  Procedure Laterality Date   COLONOSCOPY WITH PROPOFOL N/A 12/01/2015   Procedure: COLONOSCOPY WITH PROPOFOL;  Surgeon: Lucilla Lame, MD;  Location: [facility];  Service:  Endoscopy;  Laterality: N/A;   POLYPECTOMY  12/01/2015   Procedure: POLYPECTOMY;  Surgeon: Lucilla Lame, MD;  Location: [facility];  Service: Endoscopy;;   Family History  Problem Relation Age of Onset   Hypertension Mother    Diabetes Mother        lost both legs   Kidney disease Mother    Hypertension Father    Emphysema Father    Sickle cell trait Father     Hypertension Brother    Hyperlipidemia Brother    Sickle cell trait Sister    Social History   Socioeconomic History   Marital status: Married    Spouse name: Not on file   Number of children: Not on file   Years of education: Not on file   Highest education level: High school graduate  Occupational History   Not on file  Tobacco Use   Smoking status: Every Day    Packs/day: 0.25    Years: 42.00    Pack years: 10.50    Types: Cigars, Cigarettes   Smokeless tobacco: Never   Tobacco comments:    4 cigars a day   Vaping Use   Vaping Use: Never used  Substance and Sexual Activity   Alcohol use: No   Drug use: Yes    Types: Marijuana    Comment: pt states he smokes every once in a while   Sexual activity: Yes    Birth control/protection: None  Other Topics Concern   Not on file  Social History Narrative   Works part time.   Social Determinants of Health   Financial Resource Strain: Medium Risk   Difficulty of Paying Living Expenses: Somewhat hard  Food Insecurity: No Food Insecurity   Worried About Charity fundraiser in the Last Year: Never true   Ran Out of Food in the Last Year: Never true  Transportation Needs: No Transportation Needs   Lack of Transportation (Medical): No   Lack of Transportation (Non-Medical): No  Physical Activity: Sufficiently Active   Days of Exercise per Week: 3 days   Minutes of Exercise per Session: 120 min  Stress: No Stress Concern Present   Feeling of Stress : Not at all  Social Connections: Socially Integrated   Frequency of Communication with Friends and Family: More than three times a week   Frequency of Social Gatherings with Friends and Family: More than three times a week   Attends Religious Services: More than 4 times per year   Active Member of [employer] or Organizations: Yes   Attends Music therapist: More than 4 times per year   Marital Status: Married    Tobacco Counseling Ready to quit: Yes Counseling  given: Not Answered Tobacco comments: 4 cigars a day    Clinical Intake:  Pre-visit preparation completed: Yes  Pain : No/denies pain     Nutritional Status: BMI of 19-24  Normal Nutritional Risks: None Diabetes: No  How often do you need to have someone help you when you read instructions, pamphlets, or other written materials from your doctor or pharmacy?: 1 - Never What is the last grade level you completed in school?: 12th grade  Diabetic? no  Interpreter Needed?: No  Information entered by :: NAllen LPN   Activities of Daily Living In your present state of health, do you have any difficulty performing the following activities: 01/09/2021  Hearing? N  Vision? N  Difficulty concentrating or making decisions? N  Walking or climbing stairs? N  Dressing or bathing? N  Doing errands, shopping? N  Preparing Food and eating ? N  Using the Toilet? N  In the past six months, have you accidently leaked urine? N  Do you have problems with loss of bowel control? N  Managing your Medications? N  Managing your Finances? N  Housekeeping or managing your Housekeeping? N  Some recent data might be hidden    Patient Care Team: Valerie Roys, DO as PCP - General (Family Medicine) Vanita Ingles, RN as Case Manager ([place]) Rebekah Chesterfield, LCSW as Social Worker (Licensed Clinical Social Worker)  Indicate any recent [employer] you may have received from other than [facility] providers in the past year (date may be approximate).     Assessment:   This is a routine wellness examination for [place].  Hearing/Vision screen Vision Screening - Comments:: Regular eye exams, [facility]  Dietary issues and exercise activities discussed: Current Exercise Habits: Home exercise routine, Type of exercise: Other - see comments (bike riding), Time (Minutes): > 60, Frequency (Times/Week): 3, Weekly Exercise (Minutes/Week): 0   Goals Addressed             This  Visit's Progress    Patient Stated       01/09/2021, no goals       Depression Screen PHQ 2/9 Scores 01/09/2021 01/07/2021 08/13/2020 01/14/2020 01/07/2020 07/24/2019 12/26/2018  PHQ - 2 Score 0 0 0 0 0 0 0  PHQ- 9 Score - - - - - - 1    Fall Risk Fall Risk  01/09/2021 08/13/2020 01/14/2020 01/07/2020 10/27/2018  Falls in the past year? 0 0 0 0 0  Number falls in past yr: - 0 0 - 0  Injury with Fall? - 0 0 - 0  Risk for fall due to : No Fall Risks - - Medication side effect -  Follow up Falls evaluation completed;Education provided;Falls prevention discussed Falls evaluation completed Falls evaluation completed Falls evaluation completed;Education provided;Falls prevention discussed Falls evaluation completed    FALL RISK PREVENTION PERTAINING TO THE HOME:  Any stairs in or around the home? Yes  If so, are there any without handrails? Yes  Home free of loose throw rugs in walkways, pet beds, electrical cords, etc? Yes  Adequate lighting in your home to reduce risk of falls? Yes   ASSISTIVE DEVICES UTILIZED TO PREVENT FALLS:  Life alert? No  Use of a cane, walker or w/c? No  Grab bars in the bathroom? No  Shower chair or bench in shower? No  Elevated toilet seat or a handicapped toilet? No   TIMED UP AND GO:  Was the test performed? No .      Cognitive Function:     6CIT Screen 01/09/2021 01/07/2020 12/26/2018 10/27/2018 11/07/2017  What Year? 0 points 0 points 0 points 0 points 0 points  What month? 0 points 0 points 0 points 0 points 0 points  What time? 0 points 0 points 0 points 0 points 0 points  Count back from 20 0 points 0 points 2 points 0 points 0 points  Months in reverse 0 points 0 points 0 points 0 points 0 points  Repeat phrase 8 points 0 points 2 points 4 points 0 points  Total Score 8 0 4 4 0    Immunizations Immunization History  Administered Date(s) Administered   Influenza,inj,Quad PF,6+ Mos 02/17/2016, 05/04/2017, 03/10/2018, 12/26/2018, 01/14/2020    PFIZER(Purple Top)SARS-COV-2 Vaccination 07/01/2019, 07/24/2019, 05/15/2020, 10/27/2020   Pneumococcal  Polysaccharide-23 01/23/2014   Tdap 10/24/2013    TDAP status: Up to date  Flu Vaccine status: Due, Education has been provided regarding the importance of this vaccine. Advised may receive this vaccine at local pharmacy or Health Dept. Aware to provide a copy of the vaccination record if obtained from local pharmacy or Health Dept. Verbalized acceptance and understanding.  Pneumococcal vaccine status: Up to date  Covid-19 vaccine status: Completed vaccines  Qualifies for Shingles Vaccine? Yes   Zostavax completed No   Shingrix Completed?: No.    Education has been provided regarding the importance of this vaccine. Patient has been advised to call insurance company to determine out of pocket expense if they have not yet received this vaccine. Advised may also receive vaccine at local pharmacy or Health Dept. Verbalized acceptance and understanding.  Screening Tests Health Maintenance  Topic Date Due   Zoster Vaccines- Shingrix (1 of 2) Never done   INFLUENZA VACCINE  11/03/2020   TETANUS/TDAP  10/25/2023   COLONOSCOPY (Pts 45-90yrs Insurance coverage will need to be confirmed)  11/30/2025   COVID-19 Vaccine  Completed   Hepatitis C Screening  Completed   HIV Screening  Completed   HPV VACCINES  Aged Out    Health Maintenance  Health Maintenance Due  Topic Date Due   Zoster Vaccines- Shingrix (1 of 2) Never done   INFLUENZA VACCINE  11/03/2020    Colorectal cancer screening: Type of screening: Colonoscopy. Completed 12/01/2015. Repeat every 10 years  Lung Cancer Screening: (Low Dose CT Chest recommended if Age 30-80 years, 30 pack-year currently smoking OR have quit w/in 15years.) does not qualify.   Lung Cancer Screening Referral: no  Additional Screening:  Hepatitis C Screening: does qualify; Completed 09/03/2015  Vision Screening: Recommended annual ophthalmology  exams for early detection of glaucoma and other disorders of the eye. Is the patient up to date with their annual eye exam?  Yes  Who is the provider or what is the name of the office in which the patient attends annual eye exams? [facility] If pt is not established with a provider, would they like to be referred to a provider to establish care? No .   Dental Screening: Recommended annual dental exams for proper oral hygiene  Community Resource Referral / Chronic Care Management: CRR required this visit?  No   CCM required this visit?  No      Plan:     I have personally reviewed and noted the following in the patient's chart:   Medical and social history Use of alcohol, tobacco or illicit drugs  Current medications and supplements including opioid prescriptions. Patient is not currently taking opioid prescriptions. Functional ability and status Nutritional status Physical activity Advanced directives List of other physicians Hospitalizations, surgeries, and ER visits in previous 12 months Vitals Screenings to include cognitive, depression, and falls Referrals and appointments  In addition, I have reviewed and discussed with patient certain preventive protocols, quality metrics, and best practice recommendations. A written personalized care plan for preventive services as well as general preventive health recommendations were provided to patient.     Kellie Simmering, LPN   06/08/4654   Nurse Notes:

## 2021-01-09 NOTE — Patient Instructions (Signed)
Tyler Cohen , Thank you for taking time to come for your Medicare Wellness Visit. I appreciate your ongoing commitment to your health goals. Please review the following plan we discussed and let me know if I can assist you in the future.   Screening recommendations/referrals: Colonoscopy: completed 12/01/2015 Recommended yearly ophthalmology/optometry visit for glaucoma screening and checkup Recommended yearly dental visit for hygiene and checkup  Vaccinations: Influenza vaccine: due Pneumococcal vaccine: completed 01/23/2014 Tdap vaccine: completed 10/24/2013, due 10/25/2023 Shingles vaccine: discussed   Covid-19:  10/27/2020, 05/15/2020, 07/24/2019, 07/01/2019  Advanced directives: Please bring a copy of your POA (Power of Attorney) and/or Living Will to your next appointment.   Conditions/risks identified: smoking  Next appointment: Follow up in one year for your annual wellness visit.   Preventive Care 65 Years and Older, Male Preventive care refers to lifestyle choices and visits with your health care provider that can promote health and wellness. What does preventive care include? A yearly physical exam. This is also called an annual well check. Dental exams once or twice a year. Routine eye exams. Ask your health care provider how often you should have your eyes checked. Personal lifestyle choices, including: Daily care of your teeth and gums. Regular physical activity. Eating a healthy diet. Avoiding tobacco and drug use. Limiting alcohol use. Practicing safe sex. Taking low doses of aspirin every day. Taking vitamin and mineral supplements as recommended by your health care provider. What happens during an annual well check? The services and screenings done by your health care provider during your annual well check will depend on your age, overall health, lifestyle risk factors, and family history of disease. Counseling  Your health care provider may ask you questions about  your: Alcohol use. Tobacco use. Drug use. Emotional well-being. Home and relationship well-being. Sexual activity. Eating habits. History of falls. Memory and ability to understand (cognition). Work and work Statistician. Screening  You may have the following tests or measurements: Height, weight, and BMI. Blood pressure. Lipid and cholesterol levels. These may be checked every 5 years, or more frequently if you are over 65 years old. Skin check. Lung cancer screening. You may have this screening every year starting at age 65 if you have a 30-pack-year history of smoking and currently smoke or have quit within the past 15 years. Fecal occult blood test (FOBT) of the stool. You may have this test every year starting at age 65. Flexible sigmoidoscopy or colonoscopy. You may have a sigmoidoscopy every 5 years or a colonoscopy every 10 years starting at age 52. Prostate cancer screening. Recommendations will vary depending on your family history and other risks. Hepatitis C blood test. Hepatitis B blood test. Sexually transmitted disease (STD) testing. Diabetes screening. This is done by checking your blood sugar (glucose) after you have not eaten for a while (fasting). You may have this done every 1-3 years. Abdominal aortic aneurysm (AAA) screening. You may need this if you are a current or former smoker. Osteoporosis. You may be screened starting at age 65 if you are at high risk. Talk with your health care provider about your test results, treatment options, and if necessary, the need for more tests. Vaccines  Your health care provider may recommend certain vaccines, such as: Influenza vaccine. This is recommended every year. Tetanus, diphtheria, and acellular pertussis (Tdap, Td) vaccine. You may need a Td booster every 10 years. Zoster vaccine. You may need this after age 65. Pneumococcal 13-valent conjugate (PCV13) vaccine. One dose is recommended  after age 65. Pneumococcal  polysaccharide (PPSV23) vaccine. One dose is recommended after age 42. Talk to your health care provider about which screenings and vaccines you need and how often you need them. This information is not intended to replace advice given to you by your health care provider. Make sure you discuss any questions you have with your health care provider. Document Released: 04/18/2015 Document Revised: 12/10/2015 Document Reviewed: 01/21/2015 Elsevier Interactive Patient Education  2017 [place] Prevention in the Home Falls can cause injuries. They can happen to people of all ages. There are many things you can do to make your home safe and to help prevent falls. What can I do on the outside of my home? Regularly fix the edges of walkways and driveways and fix any cracks. Remove anything that might make you trip as you walk through a door, such as a raised step or threshold. Trim any bushes or trees on the path to your home. Use bright outdoor lighting. Clear any walking paths of anything that might make someone trip, such as rocks or tools. Regularly check to see if handrails are loose or broken. Make sure that both sides of any steps have handrails. Any raised decks and porches should have guardrails on the edges. Have any leaves, snow, or ice cleared regularly. Use sand or salt on walking paths during winter. Clean up any spills in your garage right away. This includes oil or grease spills. What can I do in the bathroom? Use night lights. Install grab bars by the toilet and in the tub and shower. Do not use towel bars as grab bars. Use non-skid mats or decals in the tub or shower. If you need to sit down in the shower, use a plastic, non-slip stool. Keep the floor dry. Clean up any water that spills on the floor as soon as it happens. Remove soap buildup in the tub or shower regularly. Attach bath mats securely with double-sided non-slip rug tape. Do not have throw rugs and other  things on the floor that can make you trip. What can I do in the bedroom? Use night lights. Make sure that you have a light by your bed that is easy to reach. Do not use any sheets or blankets that are too big for your bed. They should not hang down onto the floor. Have a firm chair that has side arms. You can use this for support while you get dressed. Do not have throw rugs and other things on the floor that can make you trip. What can I do in the kitchen? Clean up any spills right away. Avoid walking on wet floors. Keep items that you use a lot in easy-to-reach places. If you need to reach something above you, use a strong step stool that has a grab bar. Keep electrical cords out of the way. Do not use floor polish or wax that makes floors slippery. If you must use wax, use non-skid floor wax. Do not have throw rugs and other things on the floor that can make you trip. What can I do with my stairs? Do not leave any items on the stairs. Make sure that there are handrails on both sides of the stairs and use them. Fix handrails that are broken or loose. Make sure that handrails are as long as the stairways. Check any carpeting to make sure that it is firmly attached to the stairs. Fix any carpet that is loose or worn. Avoid having throw  rugs at the top or bottom of the stairs. If you do have throw rugs, attach them to the floor with carpet tape. Make sure that you have a light switch at the top of the stairs and the bottom of the stairs. If you do not have them, ask someone to add them for you. What else can I do to help prevent falls? Wear shoes that: Do not have high heels. Have rubber bottoms. Are comfortable and fit you well. Are closed at the toe. Do not wear sandals. If you use a stepladder: Make sure that it is fully opened. Do not climb a closed stepladder. Make sure that both sides of the stepladder are locked into place. Ask someone to hold it for you, if possible. Clearly  mark and make sure that you can see: Any grab bars or handrails. First and last steps. Where the edge of each step is. Use tools that help you move around (mobility aids) if they are needed. These include: Canes. Walkers. Scooters. Crutches. Turn on the lights when you go into a dark area. Replace any light bulbs as soon as they burn out. Set up your furniture so you have a clear path. Avoid moving your furniture around. If any of your floors are uneven, fix them. If there are any pets around you, be aware of where they are. Review your medicines with your doctor. Some medicines can make you feel dizzy. This can increase your chance of falling. Ask your doctor what other things that you can do to help prevent falls. This information is not intended to replace advice given to you by your health care provider. Make sure you discuss any questions you have with your health care provider. Document Released: 01/16/2009 Document Revised: 08/28/2015 Document Reviewed: 04/26/2014 Elsevier Interactive Patient Education  2017 [employer].

## 2021-01-20 ENCOUNTER — Telehealth: Payer: Self-pay

## 2021-01-20 NOTE — Chronic Care Management (AMB) (Signed)
  Care Management   Note  01/20/2021 Name: Tyler Cohen MRN: [id number] DOB: Aug 03, 1955  Tyler Cohen is a 65 y.o. year old male who is a primary care patient of Valerie Roys, DO and is actively engaged with the care management team. I reached out to Tyler Cohen by phone today to assist with re-scheduling a follow up visit with the Licensed Clinical Social Worker  Follow up plan: Unsuccessful telephone outreach attempt made. A HIPAA compliant phone message was left for the patient providing contact information and requesting a return call.  The care management team will reach out to the patient again over the next 7 days.  If patient returns call to provider office, please advise to call [facility]  at [place], [place], [place], [place] [address] Direct Dial: [phone] [email or web address]: [email or web address]

## 2021-01-23 ENCOUNTER — Telehealth: Payer: Self-pay

## 2021-01-23 NOTE — Telephone Encounter (Signed)
   Telephone encounter was:  Unsuccessful.  01/23/2021 Name: Tyler Cohen MRN: [id number] DOB: 06/26/1955  Unsuccessful outbound call made today to assist with:   Housing  Outreach Attempt:  1st Attempt  A HIPAA compliant voice message was left requesting a return call.  Instructed patient to call back at [phone] at their earliest convenience.  [facility] management  [place], [place]  Main Phone: [phone]  E-mail: [email or web address]  Website: [email or web address]

## 2021-01-26 ENCOUNTER — Telehealth: Payer: Self-pay

## 2021-01-26 NOTE — Telephone Encounter (Signed)
   Telephone encounter was:  Unsuccessful.  01/26/2021 Name: Tyler Cohen MRN: [id number] DOB: 05/02/1955  Unsuccessful outbound call made today to assist with:   Housing.  Outreach Attempt:  2nd Attempt-Left voicemail for patient to return my call regarding housing resources.  A HIPAA compliant voice message was left requesting a return call.  Instructed patient to call back at [phone] at their earliest convenience.  [facility] management  [place], [place]  Main Phone: [phone]  E-mail: [email or web address]  Website: [email or web address]

## 2021-01-27 ENCOUNTER — Telehealth: Payer: Self-pay

## 2021-01-27 NOTE — Telephone Encounter (Signed)
   Telephone encounter was:  Successful.  01/27/2021 Name: JAILEN COWARD MRN: [id number] DOB: 10-Jan-1956  [name] is a 65 y.o. year old male who is a primary care patient of Valerie Roys, DO . The community resource team was consulted for assistance with  Housing.  Care guide performed the following interventions: Pt needs a place for him and wife to stay. I have e-mailed him Housing properties in [place]. 4 documents have been sent to him which some have subsidize housing including low income and some waitlisted properties as well.  Follow Up Plan:  Care guide will follow up with patient by phone over the next few days to ensure resources are received.   [facility] management  [place], [place]  Main Phone: [phone]  E-mail: [email or web address]  Website: [email or web address]

## 2021-01-29 ENCOUNTER — Telehealth: Payer: Self-pay

## 2021-01-29 NOTE — Telephone Encounter (Signed)
   Telephone encounter was:  Unsuccessful.  01/29/2021 Name: Tyler Cohen MRN: [id number] DOB: 1956-03-23  Unsuccessful outbound call made today to assist with:   Housing  Outreach Attempt:  1st Attempt Unable to reach patient and voicemail is full.  [facility] management  [place], [place]  Main Phone: [phone]  E-mail: [email or web address]  Website: [email or web address]

## 2021-01-30 ENCOUNTER — Telehealth: Payer: Self-pay

## 2021-01-30 NOTE — Telephone Encounter (Signed)
   Telephone encounter was:  Unsuccessful.  01/30/2021 Name: Tyler Cohen MRN: [id number] DOB: Dec 08, 1955  Unsuccessful outbound call made today to assist with:   Housing  Outreach Attempt:  2nd Attempt  Unable to reach patient and voicemail is full.  [facility] management  [place], [place]  Main Phone: [phone]  E-mail: [email or web address]  Website: [email or web address]

## 2021-02-02 ENCOUNTER — Telehealth: Payer: Self-pay

## 2021-02-02 ENCOUNTER — Ambulatory Visit: Payer: Self-pay

## 2021-02-02 ENCOUNTER — Telehealth: Payer: Self-pay | Admitting: Family Medicine

## 2021-02-02 DIAGNOSIS — I10 Essential (primary) hypertension: Secondary | ICD-10-CM

## 2021-02-02 DIAGNOSIS — Z5900 Homelessness unspecified: Secondary | ICD-10-CM

## 2021-02-02 DIAGNOSIS — E7800 Pure hypercholesterolemia, unspecified: Secondary | ICD-10-CM | POA: Diagnosis not present

## 2021-02-02 DIAGNOSIS — E0590 Thyrotoxicosis, unspecified without thyrotoxic crisis or storm: Secondary | ICD-10-CM

## 2021-02-02 DIAGNOSIS — Z720 Tobacco use: Secondary | ICD-10-CM

## 2021-02-02 DIAGNOSIS — Z59819 Housing instability, housed unspecified: Secondary | ICD-10-CM

## 2021-02-02 NOTE — Patient Instructions (Signed)
Visit Information  PATIENT GOALS:  Goals Addressed             This Visit's Progress    RNCM: Keep Myself Safe-Substance Misuse       Follow Up Date 03/10/2021    - eat healthy - exercise more - keep emergency contact information on me - learn how to use public transportation - use public transportation    Why is this important?   Even if you are not ready to quit using alcohol or drugs, there are things you can do to stay safe.  You can stop the spread of HIV (human immunodeficiency virus) by practicing safe sex.  You can stop the spread of hepatitis or HIV by not sharing needles.  Learning CPR (cardiopulmonary resuscitation) and how to call for help may save you or a friend.     Notes: 10-29-2020: The patient is active and denies illegal drug use. Does smoke. Patient is homeless. He is living with his cousin and his wife is in a nursing home. He wants to be able to find a place of his own so his wife can come home. Has been homeless for a while. Will do a care guide referral for help with housing in [facility] and also consult and collaborate with the LCSW for recommendations. 01-07-2021: The patient states that he is still needing housing. Has resources. Will continue to monitor. 02-02-2021: The patient had called the office and ask for a call back from the [facility]. Call made back to the patient. The patient is requesting assistance with help with housing. States his roommate is in the hospital in the ICU and he is currently still in the house but does not know who to pay rent to or what to do. Education on looking at the Email sent by the care guide.  Will also collaborate with the LCSW for additional support and help.     RNCM: Stop or Cut Down Tobacco Use       Timeframe:  Long-Range Goal Priority:  High Start Date:    10-29-2020                         Expected End Date:      10-29-2021                 Follow Up Date 03/10/2021    - change or avoid triggers like smoky places,  drinking alcohol and other smokers - cut down amount of tobacco product used (chew, cigars) - cut down number of cigarettes by one-half - drink 4-6 glasses of water each day - use over-the-counter gum, patch or lozenges - use prescription medicine to decrease cravings and withdrawal - use prescription nicotine replacement - use Quit Line 1-800-Quit Now    Why is this important?   To stop or cut down it is important to have support from a person or group of people who you can count on.  You will also need to think about the things that make you feel like smoking, then plan for how to handle them.    Notes: 02-02-2021: The patient is not interested in smoking cessation at this time. Will continue to monitor.         The patient verbalized understanding of instructions, educational materials, and care plan provided today and declined offer to receive copy of patient instructions, educational materials, and care plan.   Telephone follow up appointment with care management team member  scheduled for: 03-10-2021  Noreene Larsson RN, MSN, [facility] Mobile: [phone]

## 2021-02-02 NOTE — Telephone Encounter (Signed)
Copied from [place] [phone]. Topic: General - Other >> Feb 02, 2021 11:14 AM Yvette Rack wrote: Reason for CRM: Pt stated he needs to get in contact with Ms. Hall Busing. Pt requests call back. Cb# [phone]

## 2021-02-02 NOTE — Chronic Care Management (AMB) (Signed)
Chronic Care Management   CCM RN Visit Note  02/02/2021 Name: Tyler Cohen MRN: [id number] DOB: [birth year]  Subjective: Tyler Cohen is a 65 y.o. year old male who is a primary care patient of Valerie Roys, DO. The care management team was consulted for assistance with disease management and care coordination needs.    Engaged with patient by telephone for follow up visit in response to provider referral for case management and/or care coordination services.   Consent to Services:  The patient was given information about Chronic Care Management services, agreed to services, and gave verbal consent prior to initiation of services.  Please see initial visit note for detailed documentation.   Patient agreed to services and verbal consent obtained.   Assessment: Review of patient past medical history, allergies, medications, health status, including review of consultants reports, laboratory and other test data, was performed as part of comprehensive evaluation and provision of chronic care management services.   SDOH (Social Determinants of Health) assessments and interventions performed:    CCM Care Plan  No Known Allergies  Outpatient Encounter Medications as of 02/02/2021  Medication Sig   albuterol (VENTOLIN HFA) 108 (90 Base) MCG/ACT inhaler INHALE 2 PUFFS INTO THE LUNGS EVERY 6 HOURS AS NEEDED FOR WHEEZING OR SHORTNESS OF BREATH   aspirin 81 MG tablet Take 81 mg by mouth daily.   atorvastatin (LIPITOR) 20 MG tablet TAKE 1 TABLET(20 MG) BY MOUTH AT BEDTIME   b complex vitamins tablet Take 1 tablet by mouth daily.   clobetasol ointment (TEMOVATE) 2.53 % Apply 1 application topically 2 (two) times daily.   clotrimazole-betamethasone (LOTRISONE) cream Apply 1 application topically 2 (two) times daily.   lidocaine (XYLOCAINE) 5 % ointment Apply 1 application topically as needed.   methimazole (TAPAZOLE) 10 MG tablet Take 2 tablets (20 mg total) by mouth 2 (two) times daily.    propylthiouracil (PTU) 50 MG tablet TAKE 2 TABLETS(100 MG) BY MOUTH THREE TIMES DAILY (Patient not taking: Reported on 01/09/2021)   tamsulosin (FLOMAX) 0.4 MG CAPS capsule TAKE 1 CAPSULE(0.4 MG) BY MOUTH DAILY   No facility-administered encounter medications on file as of 02/02/2021.    Patient Active Problem List   Diagnosis Date Noted   Aortic atherosclerosis ([place]) 06/25/2020   BPH (benign prostatic hyperplasia) 10/27/2018   Multinodular goiter 12/08/2017   Hyperthyroidism 12/08/2017   Left thyroid nodule 05/04/2017   Adrenal mass ([place]) 05/04/2017   Renal mass 01/17/2017   Liver lesion 11/29/2016   Advanced care planning/counseling discussion 11/02/2016   Digital mucinous cyst of finger of right hand 11/02/2016   Benign neoplasm of sigmoid colon    Psoriasis 09/03/2015   Tobacco abuse    Macrocytosis    Hyperlipidemia 08/29/2014   Dental disease 08/29/2014    Conditions to be addressed/monitored:HTN, HLD, Homelessness, Tobacco Use, and Hyperthyroidism   Care Plan : RNCM: HLD Management  Updates made by Vanita Ingles, RN since 02/02/2021 12:00 AM  Completed 02/02/2021   Problem: RNCM: Management of HLD Resolved 02/02/2021  Priority: Medium     Long-Range Goal: RNCM: HLD Management Completed 02/02/2021  Start Date: 04/23/2020  Recent Progress: On track  Priority: Medium  Note:   Current Barriers: Resolving, duplicate goal  Poorly controlled hyperlipidemia, complicated by smoking, homelessness, HTN Current antihyperlipidemic regimen: Lipitor 20 mg  Most recent lipid panel:     Component Value Date/Time   CHOL 187 07/02/2020 1101   TRIG 87 07/02/2020 1101   HDL 53 07/02/2020 1101  CHOLHDL 3.4 11/08/2017 1118   [place] 118 (H) 07/02/2020 1101   ASCVD risk enhancing conditions: age 54 HTN, current smoker Unable to independently Manage HLD  Lacks social connections Does not contact provider office for questions/concerns Homeless  [place]):  patient will work with Consulting civil engineer, providers, and care team towards execution of optimized self-health management plan patient will verbalize understanding of plan for effective management of HLD patient will work with RNCM, LCSW, and pcp to address needs related to HLD and other factors impacting the patients health and well being such as homeless situation and the patient currently living with his cousin patient will take all medications exactly as prescribed and will call provider for medication related questions patient will work with CM clinical social worker to help with housing resources and recommendations for stable housing patient will work with care guides (community agency) to assist with housing resources in [place] the patient will demonstrate ongoing self health care management ability Interventions: Collaboration with Valerie Roys, DO regarding development and update of comprehensive plan of care as evidenced by provider attestation and co-signature Inter-disciplinary care team collaboration (see longitudinal plan of care) Medication review performed; medication list updated in electronic medical record. 01-07-2021: The patient states that he is compliant with his medications, no concerns with medications Inter-disciplinary care team collaboration (see longitudinal plan of care) Referred to pharmacy team for assistance with HLD medication management Evaluation of current treatment plan related to HLD and patient's adherence to plan as established by provider. 01-07-2021: Denies any changes in his HLD health. States he is doing well. Will continue to monitor.  Advised patient to call the office for changes, questions or new concerns Provided education to patient re: eating a heart healthy diet, pacing activity, smoking cessation, and working with the CCM team to effectively manage chronic condtions  Reviewed medications with patient and discussed compliance.  01-07-2021: The patient states he has his medications and is compliant with taking his medications  Care Guide referral for help with housing resources in [place]. The patient has been living with his cousin for a while. His wife is in the nursing home and is ready to come home but he has nowhere for them to go. Is receptive to help from the care guides and LCSW. 01-07-2021: The patient continues to work with the [facility]. Social Work referral for assistance and recommendations of stable housing. 01-07-2021: The patient continues to work with LCSW Discussed plans with patient for ongoing care management follow up and provided patient with direct contact information for care management team Patient Goals/Self-Care Activities: - call for medicine refill 2 or 3 days before it runs out - call if I am sick and can't take my medicine - keep a list of all the medicines I take; vitamins and herbals too - learn to read medicine labels - use a pillbox to sort medicine - use an alarm clock or phone to remind me to take my medicine - change to whole grain breads, cereal, pasta - drink 6 to 8 glasses of water each day - eat 3 to 5 servings of fruits and vegetables each day - eat 5 or 6 small meals each day - eat fish at least once per week - fill half the plate with nonstarchy vegetables - limit fast food meals to no more than 1 per week - manage portion size - prepare main meal at home 3 to 5 days each week - read food  labels for fat, fiber, carbohydrates and portion size - reduce red meat to 2 to 3 times a week - be open to making changes - I can manage, know and watch for signs of a heart attack - if I have chest pain, call for help - learn about small changes that will make a big difference - learn my personal risk factors  Follow Up Plan: Telephone follow up appointment with care management team member scheduled for: 03-10-2021 at 1145 am       Care Plan : RNCM: Hypertension (Adult)   Updates made by Vanita Ingles, RN since 02/02/2021 12:00 AM  Completed 02/02/2021   Problem: RNCM: Hypertension (Hypertension) Resolved 02/02/2021  Priority: Medium     Long-Range Goal: RNCM: Hypertension Monitored Completed 02/02/2021  Start Date: 04/23/2020  Expected End Date: 10/24/2021  Recent Progress: On track  Priority: Medium  Note:   Objective: Resolving, duplicate goal  Last practice recorded BP readings:  BP Readings from Last 3 Encounters:  08/13/20 114/72  07/02/20 104/65  05/21/20 126/77   Most recent eGFR/CrCl:  Lab Results  Component Value Date   EGFR 80 07/02/2020    No components found for: CRCL Current Barriers:  Knowledge Deficits related to basic understanding of hypertension pathophysiology and self care management Knowledge Deficits related to understanding of medications prescribed for management of hypertension Limited Social Designer, multimedia.  Unable to independently manage HTN Lacks social connections homeless Case Manager Clinical Goal(s):  patient will verbalize understanding of plan for hypertension management patient will demonstrate improved adherence to prescribed treatment plan for hypertension as evidenced by taking all medications as prescribed, monitoring and recording blood pressure as directed, adhering to low sodium/DASH diet patient will demonstrate improved health management independence as evidenced by checking blood pressure as directed and notifying PCP if SBP>150 or DBP > 90, taking all medications as prescribe, and adhering to a low sodium diet as discussed. patient will verbalize basic understanding of hypertension disease process and self health management plan as evidenced by compliance with heart healthy diet, compliance with medications and working with the CCM team to effectively manage health and well being.  Interventions:  Collaboration with Valerie Roys, DO regarding development and update of  comprehensive plan of care as evidenced by provider attestation and co-signature Inter-disciplinary care team collaboration (see longitudinal plan of care) UNABLE to independently:manage HTN Evaluation of current treatment plan related to hypertension self management and patient's adherence to plan as established by provider. 01-07-2021: The patient states that he is doing well and denies any issues with his HTN. States he is eating well and sleeping well. Will continue to monitor. Provided education to patient re: stroke prevention, s/s of heart attack and stroke, DASH diet, complications of uncontrolled blood pressure Reviewed medications with patient and discussed importance of compliance. 01-07-2021: The patient states compliance with medications Discussed plans with patient for ongoing care management follow up and provided patient with direct contact information for care management team Advised patient, providing education and rationale, to monitor blood pressure daily and record, calling PCP for findings outside established parameters.  Self-Care Activities: - Self administers medications as prescribed Attends all scheduled provider appointments Calls provider office for new concerns, questions, or BP outside discussed parameters Checks BP and records as discussed Follows a low sodium diet/DASH diet Patient Goals: - check blood pressure weekly - choose a place to take my blood pressure (home, clinic or office, retail store) - agree on reward when goals are met -  agree to work together to make changes - ask questions to understand - have a family meeting to talk about healthy habits - learn about high blood pressure  Follow Up Plan: Telephone follow up appointment with care management team member scheduled for: 03-10-2021 at 1145 am    Care Plan : RNCM: General Plan of Care (Adult) for Chronic Disease Management and Care Coordination Needs  Updates made by Vanita Ingles, RN since  02/02/2021 12:00 AM     Problem: RNCM: Development of Plan of Care for Chronic Disease Management (HTN, HLD, Hyperthyroidism, Homelessness, Smoker)   Priority: High     Long-Range Goal: RNCM: Effective Management of Plan of Care for Chronic Disease Management (HTN, HLD, Hyperthyroidism, Homelessness, Smoker)   Start Date: 02/02/2021  Expected End Date: 02/02/2022  This Visit's Progress: On track  Priority: High  Note:   Current Barriers:  Knowledge Deficits related to plan of care for management of HTN, HLD, Homelessness, and Hyperthyroidism and smoker    Care Coordination needs related to Limited social support, Housing barriers, and Social Isolation  Chronic Disease Management support and education needs related to HTN, HLD, Homelessness, and Hyperthyroidism, and smoker  Lacks caregiver support.  Film/video editor.  Homeless, living with a roommate who is currently in the hospital, the patient asking for help with housing   RNCM Clinical Goal(s):  Patient will verbalize understanding of plan for management of HTN, HLD, Homelessness, Tobacco Use, and Hyperthyroidism as evidenced by seeing pcp on a regular basis, regular outreaches with the CCM team, following up on resources provided  take all medications exactly as prescribed and will call provider for medication related questions as evidenced by compliance with mediation, no missed doseages     demonstrate understanding of rationale for each prescribed medication as evidenced by taking as prescribed, calling before refills are needed    attend all scheduled medical appointments: with pcp as evidenced by keeping appointments and calling if appointments need to be changed        demonstrate improved and ongoing adherence to prescribed treatment plan for HTN, HLD, Homelessness, Tobacco Use, and Hyperthyroidism  as evidenced by Finding adequate housing, working with the CCM team to meet needs  continue to work with Consulting civil engineer  and/or Education officer, museum to address care management and care coordination needs related to HTN, HLD, Tobacco Use, and hyperthyroidism as evidenced by adherence to CM Team Scheduled appointments     work with Education officer, museum to address Financial constraints related to the need for stable housing, Limited social support, and Housing barriers related to the management of HTN, HLD, Homelessness, Tobacco Use, and hyperthyroidism as evidenced by review of EMR and patient or Education officer, museum report     work with Data processing manager care guide to address needs related to Housing barriers as evidenced by patient and/or community resource care guide support    demonstrate ongoing self health care management ability for effective management of chronic conditions as evidenced by     through collaboration with Consulting civil engineer, provider, and care team.   Interventions: 1:1 collaboration with primary care provider regarding development and update of comprehensive plan of care as evidenced by provider attestation and co-signature Inter-disciplinary care team collaboration (see longitudinal plan of care) Evaluation of current treatment plan related to  self management and patient's adherence to plan as established by provider   SDOH Barriers (Status: Goal on track: NO.) Long Term Goal  Patient interviewed and SDOH assessment performed  Patient interviewed and appropriate assessments performed Provided patient with information about checking email as the care guides had sent him a 4 page document with housing resources on it for help with housing in [place] with patient for ongoing care management follow up and provided patient with direct contact information for care management team Advised patient to look at email sent to the patient last week. To call the office for changes in SDOH, Questions or concerns. Also advised the patient that the [facility] would collaborate with LCSW for additional support  for housing Provided education to patient/caregiver regarding level of care options.    Hyperthyroidism  (Status: Goal on Track (progressing): YES.) Long Term Goal  Evaluation of current treatment plan related to  Hyperthyroidism ,  self-management and patient's adherence to plan as established by provider. Discussed plans with patient for ongoing care management follow up and provided patient with direct contact information for care management team Advised patient to call the office for changes in conditions, questions or concerns; Provided education to patient re: hyperthyroidism, sx and sx to monitor for ; Reviewed medications with patient and discussed compliance ; Discussed plans with patient for ongoing care management follow up and provided patient with direct contact information for care management team; Screening for signs and symptoms of depression related to chronic disease state;  Assessed social determinant of health barriers;   Hyperlipidemia:  (Status: Goal on Track (progressing): YES.) Lab Results  Component Value Date   CHOL 187 07/02/2020   HDL 53 07/02/2020   LDLCALC 118 (H) 07/02/2020   TRIG 87 07/02/2020   CHOLHDL 3.4 11/08/2017     Medication review performed; medication list updated in electronic medical record.  Provider established cholesterol goals reviewed; Counseled on importance of regular laboratory monitoring as prescribed; Provided HLD educational materials; Reviewed role and benefits of statin for ASCVD risk reduction; Discussed strategies to manage statin-induced myalgias; Reviewed importance of limiting foods high in cholesterol; Reviewed exercise goals and target of 150 minutes per week;  Hypertension: (Status: Goal on Track (progressing): YES.) Last practice recorded BP readings:  BP Readings from Last 3 Encounters:  08/13/20 114/72  07/02/20 104/65  05/21/20 126/77  Most recent eGFR/CrCl:  Lab Results  Component Value Date   EGFR 80  07/02/2020    No components found for: CRCL  Evaluation of current treatment plan related to hypertension self management and patient's adherence to plan as established by provider;   Provided education to patient re: stroke prevention, s/s of heart attack and stroke; Reviewed prescribed diet heart healthy Reviewed medications with patient and discussed importance of compliance;  Counseled on adverse effects of illicit drug and excessive alcohol use in patients with high blood pressure;  Discussed plans with patient for ongoing care management follow up and provided patient with direct contact information for care management team; Advised patient, providing education and rationale, to monitor blood pressure daily and record, calling PCP for findings outside established parameters;  Provided education on prescribed diet heart healthy;  Discussed complications of poorly controlled blood pressure such as heart disease, stroke, circulatory complications, vision complications, kidney impairment, sexual dysfunction;   Smoking Cessation: (Status: Goal on track: NO.) Long Term Goal  Reviewed smoking history:  tobacco abuse of 42 years; currently smoking 0.25 ppd Previous quit attempts, unsuccessful unknown successful using unknown  Reports smoking within 30 minutes of waking up Reports triggers to smoke include: housing situation, wife in a nursing home, stressors  Reports motivation to quit smoking  includes: knows it would be better on his health and well being  On a scale of 1-10, reports MOTIVATION to quit is 0 On a scale of 1-10, reports CONFIDENCE in quitting is 0  Evaluation of current treatment plan reviewed; Advised patient to discuss smoking cessation options with provider; Provided contact information for [place] [facility]uit [facility] (1-800-QUIT-NOW); Discussed plans with patient for ongoing care management follow up and provided patient with direct contact information for care management team; The  patient is not ready to stop smoking at this time   Homelessness   (Status: Goal on track: NO.) Long Term Goal  Evaluation of current treatment plan related to Homelessness, Housing barriers self-management and patient's adherence to plan as established by provider. Discussed plans with patient for ongoing care management follow up and provided patient with direct contact information for care management team Advised patient to look at email sent to him from the care guides with resources to help with finding stable housing for him and his wife. The pateint called the office today and ask for RNCM to return call to the patient .The patient states that his roommate is in the hospital and he is currently staying in the house but he does not know how long he can stay there. Was reaching out for help; Provided education to patient re: calling resources on the email provided by the care guides to help with housing opitons. ; Collaborated with LCSW for recommendations  regarding patients stated need of housing. ; Social Work referral for additional help and resources for housing needs ; Discussed plans with patient for ongoing care management follow up and provided patient with direct contact information for care management team;   Patient Goals/Self-Care Activities: Patient will self administer medications as prescribed as evidenced by self report/primary caregiver report  Patient will attend all scheduled provider appointments as evidenced by clinician review of documented attendance to scheduled appointments and patient/caregiver report Patient will call pharmacy for medication refills as evidenced by patient report and review of pharmacy fill history as appropriate Patient will attend church or other social activities as evidenced by patient report Patient will continue to perform ADL's independently as evidenced by patient/caregiver report Patient will continue to perform IADL's independently as  evidenced by patient/caregiver report Patient will call provider office for new concerns or questions as evidenced by review of documented incoming telephone call notes and patient report Patient will work with BSW to address care coordination needs and will continue to work with the clinical team to address health care and disease management related needs as evidenced by documented adherence to scheduled care management/care coordination appointments - check blood pressure weekly - choose a place to take my blood pressure (home, clinic or office, retail store) - write blood pressure results in a log or diary - learn about high blood pressure - keep a blood pressure log - take blood pressure log to all doctor appointments - call doctor for signs and symptoms of high blood pressure - develop an action plan for high blood pressure - keep all doctor appointments - take medications for blood pressure exactly as prescribed - report new symptoms to your doctor - eat more whole grains, fruits and vegetables, lean meats and healthy fats - call for medicine refill 2 or 3 days before it runs out - take all medications exactly as prescribed - call doctor with any symptoms you believe are related to your medicine - call doctor when you experience any new symptoms - go  to all doctor appointments as scheduled - adhere to prescribed diet: Heart Healthy        Plan:Telephone follow up appointment with care management team member scheduled for: 03-10-2021  Noreene Larsson RN, MSN, [facility] Mobile: [phone]

## 2021-02-02 NOTE — Telephone Encounter (Signed)
   Telephone encounter was:  Successful.  02/02/2021 Name: CARDALE DORER MRN: [id number] DOB: Aug 21, 1955  [name] is a 65 y.o. year old male who is a primary care patient of Valerie Roys, DO . The community resource team was consulted for assistance with  Housing  Care guide performed the following interventions:  Pt called back wrong dept, however, I inquired if he received the e-mails I sent to him. He advised he would check but would like a hardcopy too. He stated no further assistance needed and needed speak to Education officer, museum at [place], [place] was transferred over .  Follow Up Plan:  No further follow up planned at this time. The patient has been provided with needed resources. Patient was sent documents twice by e-mail and one by mail. Enclosed: Systems analyst for [employer], Income based Housing, [employer], and Printmaker.  [place] management  [place], [place]  Main Phone: [phone]  E-mail: [email or web address]  Website: [email or web address]

## 2021-02-03 ENCOUNTER — Ambulatory Visit (INDEPENDENT_AMBULATORY_CARE_PROVIDER_SITE_OTHER): Payer: Medicare HMO | Admitting: Licensed Clinical Social Worker

## 2021-02-03 DIAGNOSIS — E7800 Pure hypercholesterolemia, unspecified: Secondary | ICD-10-CM

## 2021-02-03 DIAGNOSIS — I700 Atherosclerosis of aorta: Secondary | ICD-10-CM

## 2021-02-03 DIAGNOSIS — E0590 Thyrotoxicosis, unspecified without thyrotoxic crisis or storm: Secondary | ICD-10-CM

## 2021-02-03 DIAGNOSIS — Z720 Tobacco use: Secondary | ICD-10-CM

## 2021-02-03 NOTE — Chronic Care Management (AMB) (Signed)
Chronic Care Management    Clinical Social Work Note  02/03/2021 Name: AYCE PIETRZYK MRN: [id number] DOB: 1955/06/22  [name] is a 65 y.o. year old male who is a primary care patient of Valerie Roys, DO. The CCM team was consulted to assist the patient with chronic disease management and/or care coordination needs related to: [employer] .   Engaged with patient by telephone for follow up visit in response to provider referral for social work chronic care management and care coordination services.   Consent to Services:  The patient was given information about Chronic Care Management services, agreed to services, and gave verbal consent prior to initiation of services.  Please see initial visit note for detailed documentation.   Patient agreed to services and consent obtained.   Consent to Services:  The patient was given information about Care Management services, agreed to services, and gave verbal consent prior to initiation of services.  Please see initial visit note for detailed documentation.   Patient agreed to services today and consent obtained.  Engaged with patient by phone in response to provider referral for social work care coordination services:  Assessment/Interventions:  Patient continues to experience difficulty with obtaining housing. Patient is concerned that he will not have access to residence due to cousin's current hospitalization. CCM LCSW collaborated with [place] regarding housing resources. Stress management strategies discussed.  See Care Plan below for interventions and patient self-care activities.  Recent life changes or stressors: Housing  Recommendation: Patient may benefit from, and is in agreement work with LCSW to address care coordination needs and will continue to work with the clinical team to address health care and disease management related needs.   Follow up Plan: Patient would like continued follow-up from CCM  LCSW .  per patient's request will follow up in 2 weeks.  Will call office if needed prior to next encounter.  SDOH (Social Determinants of Health) assessments and interventions performed:  Housing  Advanced Directives Status: Not addressed in this encounter.  CCM Care Plan  No Known Allergies  Outpatient Encounter Medications as of 02/03/2021  Medication Sig   albuterol (VENTOLIN HFA) 108 (90 Base) MCG/ACT inhaler INHALE 2 PUFFS INTO THE LUNGS EVERY 6 HOURS AS NEEDED FOR WHEEZING OR SHORTNESS OF BREATH   aspirin 81 MG tablet Take 81 mg by mouth daily.   atorvastatin (LIPITOR) 20 MG tablet TAKE 1 TABLET(20 MG) BY MOUTH AT BEDTIME   b complex vitamins tablet Take 1 tablet by mouth daily.   clobetasol ointment (TEMOVATE) 1.59 % Apply 1 application topically 2 (two) times daily.   clotrimazole-betamethasone (LOTRISONE) cream Apply 1 application topically 2 (two) times daily.   lidocaine (XYLOCAINE) 5 % ointment Apply 1 application topically as needed.   methimazole (TAPAZOLE) 10 MG tablet Take 2 tablets (20 mg total) by mouth 2 (two) times daily.   propylthiouracil (PTU) 50 MG tablet TAKE 2 TABLETS(100 MG) BY MOUTH THREE TIMES DAILY (Patient not taking: Reported on 01/09/2021)   tamsulosin (FLOMAX) 0.4 MG CAPS capsule TAKE 1 CAPSULE(0.4 MG) BY MOUTH DAILY   No facility-administered encounter medications on file as of 02/03/2021.    Patient Active Problem List   Diagnosis Date Noted   Aortic atherosclerosis ([place]) 06/25/2020   BPH (benign prostatic hyperplasia) 10/27/2018   Multinodular goiter 12/08/2017   Hyperthyroidism 12/08/2017   Left thyroid nodule 05/04/2017   Adrenal mass ([place]) 05/04/2017   Renal mass 01/17/2017   Liver lesion 11/29/2016  Advanced care planning/counseling discussion 11/02/2016   Digital mucinous cyst of finger of right hand 11/02/2016   Benign neoplasm of sigmoid colon    Psoriasis 09/03/2015   Tobacco abuse    Macrocytosis    Hyperlipidemia 08/29/2014    Dental disease 08/29/2014    Conditions to be addressed/monitored:  Housing barriers  Care Plan : General Social Work (Adult)  Updates made by Rebekah Chesterfield, LCSW since 02/03/2021 12:00 AM     Problem: Need for Independent Housing      Goal: Barriers to Treatment Identified and Managed   Start Date: 11/13/2020  This Visit's Progress: On track  Recent Progress: On track  Priority: High  Note:   Current barriers:    Housing barriers Clinical Goals: Patient will work with LCSW to address needs related to obtaining independent housing Clinical Interventions:  Patient is interested in obtaining independent housing. He currently resides with a cousin and wife is in a nursing home 11/1: Maudry Diego is hospitalized in critical condition and patient is concerned that he has to find housing asap. Patient agreed to speak with cousin's landlord regarding late rent Per chart review, a care guide referral was placed to assist with housing resources 11/1: CCM LCSW collaborated with Cameron Proud, who was successful in contacting patient this week regarding housing. Supportive resources have been mailed to pt's residence on file noting patient states inability to receive e-mail. Ms. Melina Fiddler will mail additional resources regarding local senior housing   CCM LCSW discussed strategies to assist with stress management Patient shared financial strain from being on a fixed income and having to repair car (it should be available Thursday, 11/3) He would like a unit that is on the first floor and under $400 within [facility]. He receives SNAP benefits Patient denies barriers with literacy CCM LCSW discussed supportive resources to assist. LCSW will mail resources discussed to address on file Patient denies any additional stressors or mental health needs CCM LCSW collaborated with [place], Noreene Larsson, regarding patient needs and care plan 1:1 collaboration with primary care provider regarding  development and update of comprehensive plan of care as evidenced by provider attestation and co-signature Inter-disciplinary care team collaboration (see longitudinal plan of care) Assessment of needs, barriers , agencies contacted, as well as how impacting Review various resources, discussed options and provided patient information about  Housing resources ([email or web address]) Solution-Focused Strategies, Active listening / Reflection utilized , Emotional Supportive Provided, and Verbalization of feelings encouraged  Patient Goals/Self-Care Activities: Over the next 120 days Follow up with previous referral to the care guide for housing resources, they will follow up with you  Contact clinic with any questions or concerns Utilize resources discussed        Christa See, MSW, [email or web address] Phone [phone] 5:34 PM

## 2021-02-03 NOTE — Patient Instructions (Signed)
Visit Information  Patient verbalizes understanding of instructions provided today.   Telephone follow up appointment with care management team member scheduled for:02/23/21  Christa See, MSW, [email or web address] Phone [phone] 5:38 PM

## 2021-02-23 ENCOUNTER — Ambulatory Visit: Payer: Medicare HMO | Admitting: Licensed Clinical Social Worker

## 2021-02-23 DIAGNOSIS — E7800 Pure hypercholesterolemia, unspecified: Secondary | ICD-10-CM

## 2021-02-23 DIAGNOSIS — I700 Atherosclerosis of aorta: Secondary | ICD-10-CM

## 2021-02-23 DIAGNOSIS — L409 Psoriasis, unspecified: Secondary | ICD-10-CM

## 2021-02-23 DIAGNOSIS — E0590 Thyrotoxicosis, unspecified without thyrotoxic crisis or storm: Secondary | ICD-10-CM

## 2021-02-23 DIAGNOSIS — N401 Enlarged prostate with lower urinary tract symptoms: Secondary | ICD-10-CM

## 2021-02-23 NOTE — Chronic Care Management (AMB) (Signed)
Chronic Care Management    Clinical Social Work Note  02/23/2021 Name: Tyler Cohen MRN: [id number] DOB: 07/25/1955  [name] is a 65 y.o. year old male who is a primary care patient of Valerie Roys, DO. The CCM team was consulted to assist the patient with chronic disease management and/or care coordination needs related to: [employer] .   Engaged with patient by telephone for follow up visit in response to provider referral for social work chronic care management and care coordination services.   Consent to Services:  The patient was given information about Chronic Care Management services, agreed to services, and gave verbal consent prior to initiation of services.  Please see initial visit note for detailed documentation.   Patient agreed to services and consent obtained.   Consent to Services:  The patient was given information about Care Management services, agreed to services, and gave verbal consent prior to initiation of services.  Please see initial visit note for detailed documentation.   Patient agreed to services today and consent obtained.  Engaged with patient by phone in response to provider referral for social work care coordination services:  Assessment/Interventions:  Patient continues to maintain positive progress with care plan goals. Patient received housing resources and plans to reach out to agencies after holidays. He currently has a safe place to reside.   See Care Plan below for interventions and patient self-care activities.  Recent life changes or stressors: Housing  Recommendation: Patient may benefit from, and is in agreement work with LCSW to address care coordination needs and will continue to work with the clinical team to address health care and disease management related needs.   Follow up Plan: Patient would like continued follow-up from CCM LCSW .  per patient's request will follow up in 03/23/21.  Will call office if needed  prior to next encounter.     SDOH (Social Determinants of Health) assessments and interventions performed:    Advanced Directives Status: Not addressed in this encounter.  CCM Care Plan  No Known Allergies  Outpatient Encounter Medications as of 02/23/2021  Medication Sig   albuterol (VENTOLIN HFA) 108 (90 Base) MCG/ACT inhaler INHALE 2 PUFFS INTO THE LUNGS EVERY 6 HOURS AS NEEDED FOR WHEEZING OR SHORTNESS OF BREATH   aspirin 81 MG tablet Take 81 mg by mouth daily.   atorvastatin (LIPITOR) 20 MG tablet TAKE 1 TABLET(20 MG) BY MOUTH AT BEDTIME   b complex vitamins tablet Take 1 tablet by mouth daily.   clobetasol ointment (TEMOVATE) 4.43 % Apply 1 application topically 2 (two) times daily.   clotrimazole-betamethasone (LOTRISONE) cream Apply 1 application topically 2 (two) times daily.   lidocaine (XYLOCAINE) 5 % ointment Apply 1 application topically as needed.   methimazole (TAPAZOLE) 10 MG tablet Take 2 tablets (20 mg total) by mouth 2 (two) times daily.   propylthiouracil (PTU) 50 MG tablet TAKE 2 TABLETS(100 MG) BY MOUTH THREE TIMES DAILY (Patient not taking: Reported on 01/09/2021)   tamsulosin (FLOMAX) 0.4 MG CAPS capsule TAKE 1 CAPSULE(0.4 MG) BY MOUTH DAILY   No facility-administered encounter medications on file as of 02/23/2021.    Patient Active Problem List   Diagnosis Date Noted   Aortic atherosclerosis ([place]) 06/25/2020   BPH (benign prostatic hyperplasia) 10/27/2018   Multinodular goiter 12/08/2017   Hyperthyroidism 12/08/2017   Left thyroid nodule 05/04/2017   Adrenal mass ([place]) 05/04/2017   Renal mass 01/17/2017   Liver lesion 11/29/2016   Advanced care planning/counseling discussion  11/02/2016   Digital mucinous cyst of finger of right hand 11/02/2016   Benign neoplasm of sigmoid colon    Psoriasis 09/03/2015   Tobacco abuse    Macrocytosis    Hyperlipidemia 08/29/2014   Dental disease 08/29/2014    Conditions to be addressed/monitored:  Hyperthyroidism,  Hyperlipidemia, BPH, Aortic Atherosclerosis ; Housing barriers  Care Plan : General Social Work (Adult)  Updates made by Rebekah Chesterfield, LCSW since 02/23/2021 12:00 AM     Problem: Need for Independent Housing      Goal: Barriers to Treatment Identified and Managed   Start Date: 11/13/2020  This Visit's Progress: On track  Recent Progress: On track  Priority: High  Note:   Current barriers:    Housing barriers Clinical Goals: Patient will work with LCSW to address needs related to obtaining independent housing Clinical Interventions:  Patient is interested in obtaining independent housing. He currently resides with a cousin and wife is in a nursing home 11/1: Maudry Diego is hospitalized in critical condition and patient is concerned that he has to find housing asap. Patient agreed to speak with cousin's landlord regarding late rent 11/21: Patient continues to reside at cousin's house noting "He ain't doing too good". Patient received housing resources. Patient plans on apartments to inquire about availability after the holidays Per chart review, a care guide referral was placed to assist with housing resources 11/1: CCM LCSW collaborated with Cameron Proud, who was successful in contacting patient this week regarding housing. Supportive resources have been mailed to pt's residence on file noting patient states inability to receive e-mail. Ms. Melina Fiddler will mail additional resources regarding local senior housing   CCM LCSW discussed strategies to assist with stress management Patient shared financial strain from being on a fixed income and having to repair car (it should be available Thursday, 11/3) He would like a unit that is on the first floor and under $400 within [facility]. He receives SNAP benefits Patient denies barriers with literacy CCM LCSW discussed supportive resources to assist. LCSW will mail resources discussed to address on file Patient denies any additional stressors  or mental health needs CCM LCSW collaborated with [place], Noreene Larsson, regarding patient needs and care plan 1:1 collaboration with primary care provider regarding development and update of comprehensive plan of care as evidenced by provider attestation and co-signature Inter-disciplinary care team collaboration (see longitudinal plan of care) Assessment of needs, barriers , agencies contacted, as well as how impacting Review various resources, discussed options and provided patient information about  Housing resources ([email or web address]) Solution-Focused Strategies, Active listening / Reflection utilized , Emotional Supportive Provided, and Verbalization of feelings encouraged  Patient Goals/Self-Care Activities: Over the next 120 days Follow up with previous referral to the care guide for housing resources, they will follow up with you  Contact clinic with any questions or concerns Utilize resources discussed         Christa See, MSW, [email or web address] Phone [phone] 10:17 AM

## 2021-02-23 NOTE — Patient Instructions (Addendum)
Visit Information  Thank you for taking time to visit with me today. Please don't hesitate to contact me if I can be of assistance to you before our next scheduled telephone appointment.  Following are the goals we discussed today:  Patient Goals/Self-Care Activities: Over the next 120 days Follow up with previous referral to the care guide for housing resources, they will follow up with you  Contact clinic with any questions or concerns Utilize resources discussed  Our next appointment is by telephone on 03/23/22 at 10:30 AM  Please call the care guide team at [phone] if you need to cancel or reschedule your appointment.   Please call 911 if you are experiencing a Mental Health or [place] or need someone to talk to.  Patient verbalizes understanding of instructions provided today  Christa See, MSW, [email or web address] Phone [phone] 10:19 AM

## 2021-03-04 DIAGNOSIS — R350 Frequency of micturition: Secondary | ICD-10-CM

## 2021-03-04 DIAGNOSIS — E7800 Pure hypercholesterolemia, unspecified: Secondary | ICD-10-CM

## 2021-03-04 DIAGNOSIS — N401 Enlarged prostate with lower urinary tract symptoms: Secondary | ICD-10-CM

## 2021-03-04 DIAGNOSIS — Z87891 Personal history of nicotine dependence: Secondary | ICD-10-CM

## 2021-03-10 ENCOUNTER — Ambulatory Visit (INDEPENDENT_AMBULATORY_CARE_PROVIDER_SITE_OTHER): Payer: Medicare HMO

## 2021-03-10 ENCOUNTER — Telehealth: Payer: Medicare HMO

## 2021-03-10 ENCOUNTER — Other Ambulatory Visit: Payer: Self-pay

## 2021-03-10 DIAGNOSIS — I10 Essential (primary) hypertension: Secondary | ICD-10-CM

## 2021-03-10 DIAGNOSIS — Z5900 Homelessness unspecified: Secondary | ICD-10-CM

## 2021-03-10 DIAGNOSIS — Z59819 Housing instability, housed unspecified: Secondary | ICD-10-CM

## 2021-03-10 DIAGNOSIS — E7800 Pure hypercholesterolemia, unspecified: Secondary | ICD-10-CM

## 2021-03-10 DIAGNOSIS — Z720 Tobacco use: Secondary | ICD-10-CM

## 2021-03-10 DIAGNOSIS — E0590 Thyrotoxicosis, unspecified without thyrotoxic crisis or storm: Secondary | ICD-10-CM

## 2021-03-10 DIAGNOSIS — Z23 Encounter for immunization: Secondary | ICD-10-CM

## 2021-03-10 NOTE — Patient Instructions (Signed)
Visit Information  Thank you for taking time to visit with me today. Please don't hesitate to contact me if I can be of assistance to you before our next scheduled telephone appointment.  Following are the goals we discussed today:    RNCM Clinical Goal(s):  Patient will verbalize understanding of plan for management of HTN, HLD, Homelessness, Tobacco Use, and Hyperthyroidism as evidenced by seeing pcp on a regular basis, regular outreaches with the CCM team, following up on resources provided  take all medications exactly as prescribed and will call provider for medication related questions as evidenced by compliance with mediation, no missed doseages     demonstrate understanding of rationale for each prescribed medication as evidenced by taking as prescribed, calling before refills are needed    attend all scheduled medical appointments: with pcp as evidenced by keeping appointments and calling if appointments need to be changed        demonstrate improved and ongoing adherence to prescribed treatment plan for HTN, HLD, Homelessness, Tobacco Use, and Hyperthyroidism  as evidenced by Finding adequate housing, working with the CCM team to meet needs  continue to work with Consulting civil engineer and/or Education officer, museum to address care management and care coordination needs related to HTN, HLD, Tobacco Use, and hyperthyroidism as evidenced by adherence to CM Team Scheduled appointments     work with Education officer, museum to address Financial constraints related to the need for stable housing, Limited social support, and Housing barriers related to the management of HTN, HLD, Homelessness, Tobacco Use, and hyperthyroidism as evidenced by review of EMR and patient or Education officer, museum report     work with Data processing manager care guide to address needs related to Housing barriers as evidenced by patient and/or community resource care guide support    demonstrate ongoing self health care management ability for effective  management of chronic conditions as evidenced by     through collaboration with Consulting civil engineer, provider, and care team.    Interventions: 1:1 collaboration with primary care provider regarding development and update of comprehensive plan of care as evidenced by provider attestation and co-signature Inter-disciplinary care team collaboration (see longitudinal plan of care) Evaluation of current treatment plan related to  self management and patient's adherence to plan as established by provider     SDOH Barriers (Status: Goal on track: NO.) Long Term Goal  Patient interviewed and SDOH assessment performed        Patient interviewed and appropriate assessments performed Provided patient with information about checking email as the care guides had sent him a 4 page document with housing resources on it for help with housing in [place]. 2021-04-08: His roommate passed away on 04/06/21 and he is currently staying in the home but is leaving. The landlord wants >900.00 and it has mold. The patient states he is not staying there. He does have somewhere to stay until he finds somewhere to go. He has received information in mail about resources for housing and is working on trying to find a new place to live. Discussed plans with patient for ongoing care management follow up and provided patient with direct contact information for care management team Advised patient to look at email sent to the patient last week. To call the office for changes in SDOH, Questions or concerns. Also advised the patient that the [facility] would collaborate with LCSW for additional support for housing Provided education to patient/caregiver regarding level of care options.  Hyperthyroidism  (Status: Goal on Track (progressing): YES.) Long Term Goal  Evaluation of current treatment plan related to  Hyperthyroidism ,  self-management and patient's adherence to plan as established by provider. 03-10-2021: Denies any  issues with his hyperthyroidism. Will continue to monitor Discussed plans with patient for ongoing care management follow up and provided patient with direct contact information for care management team Advised patient to call the office for changes in conditions, questions or concerns; Provided education to patient re: hyperthyroidism, sx and sx to monitor for ; Reviewed medications with patient and discussed compliance ; Discussed plans with patient for ongoing care management follow up and provided patient with direct contact information for care management team; Screening for signs and symptoms of depression related to chronic disease state;  Assessed social determinant of health barriers;    Hyperlipidemia:  (Status: Goal on Track (progressing): YES.)      Lab Results  Component Value Date    CHOL 187 07/02/2020    HDL 53 07/02/2020    LDLCALC 118 (H) 07/02/2020    TRIG 87 07/02/2020    CHOLHDL 3.4 11/08/2017      Medication review performed; medication list updated in electronic medical record.  Provider established cholesterol goals reviewed; Counseled on importance of regular laboratory monitoring as prescribed; Provided HLD educational materials; Reviewed role and benefits of statin for ASCVD risk reduction; Discussed strategies to manage statin-induced myalgias; Reviewed importance of limiting foods high in cholesterol; Reviewed exercise goals and target of 150 minutes per week;   Hypertension: (Status: Goal on Track (progressing): YES.) Last practice recorded BP readings:     BP Readings from Last 3 Encounters:  08/13/20 114/72  07/02/20 104/65  05/21/20 126/77  Most recent eGFR/CrCl:       Lab Results  Component Value Date    EGFR 80 07/02/2020    No components found for: CRCL   Evaluation of current treatment plan related to hypertension self management and patient's adherence to plan as established by provider. 03-10-2021: Denies any issues with heart health.  Will continue to monitor.    Provided education to patient re: stroke prevention, s/s of heart attack and stroke; Reviewed prescribed diet heart healthy Reviewed medications with patient and discussed importance of compliance;  Counseled on adverse effects of illicit drug and excessive alcohol use in patients with high blood pressure;  Discussed plans with patient for ongoing care management follow up and provided patient with direct contact information for care management team; Advised patient, providing education and rationale, to monitor blood pressure daily and record, calling PCP for findings outside established parameters;  Provided education on prescribed diet heart healthy;  Discussed complications of poorly controlled blood pressure such as heart disease, stroke, circulatory complications, vision complications, kidney impairment, sexual dysfunction;    Smoking Cessation: (Status: Patient declined further engagement on this goal.) Long Term Goal  Reviewed smoking history:  tobacco abuse of 42 years; currently smoking 0.25 ppd Previous quit attempts, unsuccessful unknown successful using unknown  Reports smoking within 30 minutes of waking up Reports triggers to smoke include: housing situation, wife in a nursing home, stressors  Reports motivation to quit smoking includes: knows it would be better on his health and well being  On a scale of 1-10, reports MOTIVATION to quit is 0 On a scale of 1-10, reports CONFIDENCE in quitting is 0   Evaluation of current treatment plan reviewed; Advised patient to discuss smoking cessation options with provider; Provided contact information for [place] [facility]uit  Line (1-800-QUIT-NOW); Discussed plans with patient for ongoing care management follow up and provided patient with direct contact information for care management team; The patient is not ready to stop smoking at this time     Homelessness   (Status: Goal on track: NO.) Long Term Goal  Evaluation  of current treatment plan related to Homelessness, Housing barriers self-management and patient's adherence to plan as established by provider. Discussed plans with patient for ongoing care management follow up and provided patient with direct contact information for care management team Advised patient to look at email sent to him from the care guides with resources to help with finding stable housing for him and his wife. The pateint called the office today and ask for RNCM to return call to the patient .The patient states that his roommate is in the hospital and he is currently staying in the house but he does not know how long he can stay there. Was reaching out for help. 03-10-2021: The patient has received mailed copy of resources. His roommate died on 10-Mar-2021 and he has to move from the house. He is staying there until next week. He does not want to live there as there is mold in the home and the landlord wants >900.00 per month. The patient states he has somewhere to go until he can find stable housing. Encouraged the patient to utilize resources he has to find stable housing. Will also update the LCSW. Provided education to patient re: calling resources on the email provided by the care guides to help with housing opitons. ; Collaborated with LCSW for recommendations  regarding patients stated need of housing. ; Social Work referral for additional help and resources for housing needs ; Discussed plans with patient for ongoing care management follow up and provided patient with direct contact information for care management team;    Patient Goals/Self-Care Activities: Patient will self administer medications as prescribed as evidenced by self report/primary caregiver report  Patient will attend all scheduled provider appointments as evidenced by clinician review of documented attendance to scheduled appointments and patient/caregiver report Patient will call pharmacy for medication refills as  evidenced by patient report and review of pharmacy fill history as appropriate Patient will attend church or other social activities as evidenced by patient report Patient will continue to perform ADL's independently as evidenced by patient/caregiver report Patient will continue to perform IADL's independently as evidenced by patient/caregiver report Patient will call provider office for new concerns or questions as evidenced by review of documented incoming telephone call notes and patient report Patient will work with BSW to address care coordination needs and will continue to work with the clinical team to address health care and disease management related needs as evidenced by documented adherence to scheduled care management/care coordination appointments - check blood pressure weekly - choose a place to take my blood pressure (home, clinic or office, retail store) - write blood pressure results in a log or diary - learn about high blood pressure - keep a blood pressure log - take blood pressure log to all doctor appointments - call doctor for signs and symptoms of high blood pressure - develop an action plan for high blood pressure - keep all doctor appointments - take medications for blood pressure exactly as prescribed - report new symptoms to your doctor - eat more whole grains, fruits and vegetables, lean meats and healthy fats - call for medicine refill 2 or 3 days before it runs out - take all medications  exactly as prescribed - call doctor with any symptoms you believe are related to your medicine - call doctor when you experience any new symptoms - go to all doctor appointments as scheduled - adhere to prescribed diet: Heart Healthy           Our next appointment is by telephone on 05-12-2021 at 1145 am  Please call the care guide team at [phone] if you need to cancel or reschedule your appointment.   If you are experiencing a Mental Health or [place] or need someone to talk to, please call the Suicide and Crisis Lifeline: 988 call the [place] National Suicide Prevention Lifeline: (819) 438-0679 or TTY: (716) 267-1916 TTY 902-853-5239) to talk to a trained counselor call 1-800-273-TALK (toll free, 24 hour hotline)   The patient verbalized understanding of instructions, educational materials, and care plan provided today and declined offer to receive copy of patient instructions, educational materials, and care plan.   Noreene Larsson RN, MSN, [facility] Mobile: [phone]

## 2021-03-10 NOTE — Progress Notes (Signed)
Patient presents today for his high dose flu vaccination, patient received in Left deltoid, patient tolerated well.

## 2021-03-10 NOTE — Chronic Care Management (AMB) (Signed)
Chronic Care Management   CCM RN Visit Note  03/10/2021 Name: Tyler Cohen MRN: [id number] DOB: [birth year]  Subjective: Tyler Cohen is a 65 y.o. year old male who is a primary care patient of Valerie Roys, DO. The care management team was consulted for assistance with disease management and care coordination needs.    Engaged with patient by telephone for follow up visit in response to provider referral for case management and/or care coordination services.   Consent to Services:  The patient was given information about Chronic Care Management services, agreed to services, and gave verbal consent prior to initiation of services.  Please see initial visit note for detailed documentation.   Patient agreed to services and verbal consent obtained.   Assessment: Review of patient past medical history, allergies, medications, health status, including review of consultants reports, laboratory and other test data, was performed as part of comprehensive evaluation and provision of chronic care management services.   SDOH (Social Determinants of Health) assessments and interventions performed:    CCM Care Plan  No Known Allergies  Outpatient Encounter Medications as of 03/10/2021  Medication Sig   albuterol (VENTOLIN HFA) 108 (90 Base) MCG/ACT inhaler INHALE 2 PUFFS INTO THE LUNGS EVERY 6 HOURS AS NEEDED FOR WHEEZING OR SHORTNESS OF BREATH   aspirin 81 MG tablet Take 81 mg by mouth daily.   atorvastatin (LIPITOR) 20 MG tablet TAKE 1 TABLET(20 MG) BY MOUTH AT BEDTIME   b complex vitamins tablet Take 1 tablet by mouth daily.   clobetasol ointment (TEMOVATE) 2.53 % Apply 1 application topically 2 (two) times daily.   clotrimazole-betamethasone (LOTRISONE) cream Apply 1 application topically 2 (two) times daily.   lidocaine (XYLOCAINE) 5 % ointment Apply 1 application topically as needed.   methimazole (TAPAZOLE) 10 MG tablet Take 2 tablets (20 mg total) by mouth 2 (two) times daily.    propylthiouracil (PTU) 50 MG tablet TAKE 2 TABLETS(100 MG) BY MOUTH THREE TIMES DAILY (Patient not taking: Reported on 01/09/2021)   tamsulosin (FLOMAX) 0.4 MG CAPS capsule TAKE 1 CAPSULE(0.4 MG) BY MOUTH DAILY   No facility-administered encounter medications on file as of 03/10/2021.    Patient Active Problem List   Diagnosis Date Noted   Aortic atherosclerosis ([place]) 06/25/2020   BPH (benign prostatic hyperplasia) 10/27/2018   Multinodular goiter 12/08/2017   Hyperthyroidism 12/08/2017   Left thyroid nodule 05/04/2017   Adrenal mass ([place]) 05/04/2017   Renal mass 01/17/2017   Liver lesion 11/29/2016   Advanced care planning/counseling discussion 11/02/2016   Digital mucinous cyst of finger of right hand 11/02/2016   Benign neoplasm of sigmoid colon    Psoriasis 09/03/2015   Tobacco abuse    Macrocytosis    Hyperlipidemia 08/29/2014   Dental disease 08/29/2014    Conditions to be addressed/monitored:HTN, HLD, Homelessness, Tobacco Use, and Hyperthyroidism   Care Plan : RNCM: General Plan of Care (Adult) for Chronic Disease Management and Care Coordination Needs  Updates made by Vanita Ingles, RN since 03/10/2021 12:00 AM     Problem: RNCM: Development of Plan of Care for Chronic Disease Management (HTN, HLD, Hyperthyroidism, Homelessness, Smoker)   Priority: High     Long-Range Goal: RNCM: Effective Management of Plan of Care for Chronic Disease Management (HTN, HLD, Hyperthyroidism, Homelessness, Smoker)   Start Date: 02/02/2021  Expected End Date: 02/02/2022  Recent Progress: On track  Priority: High  Note:   Current Barriers:  Knowledge Deficits related to plan of care for management of  HTN, HLD, Homelessness, and Hyperthyroidism and smoker    Care Coordination needs related to Limited social support, Housing barriers, and Social Isolation  Chronic Disease Management support and education needs related to HTN, HLD, Homelessness, and Hyperthyroidism, and smoker  Lacks  caregiver support.  Film/video editor.  Homeless, living with a roommate who is currently in the hospital, the patient asking for help with housing   RNCM Clinical Goal(s):  Patient will verbalize understanding of plan for management of HTN, HLD, Homelessness, Tobacco Use, and Hyperthyroidism as evidenced by seeing pcp on a regular basis, regular outreaches with the CCM team, following up on resources provided  take all medications exactly as prescribed and will call provider for medication related questions as evidenced by compliance with mediation, no missed doseages     demonstrate understanding of rationale for each prescribed medication as evidenced by taking as prescribed, calling before refills are needed    attend all scheduled medical appointments: with pcp as evidenced by keeping appointments and calling if appointments need to be changed        demonstrate improved and ongoing adherence to prescribed treatment plan for HTN, HLD, Homelessness, Tobacco Use, and Hyperthyroidism  as evidenced by Finding adequate housing, working with the CCM team to meet needs  continue to work with Consulting civil engineer and/or Education officer, museum to address care management and care coordination needs related to HTN, HLD, Tobacco Use, and hyperthyroidism as evidenced by adherence to CM Team Scheduled appointments     work with Education officer, museum to address Financial constraints related to the need for stable housing, Limited social support, and Housing barriers related to the management of HTN, HLD, Homelessness, Tobacco Use, and hyperthyroidism as evidenced by review of EMR and patient or Education officer, museum report     work with Data processing manager care guide to address needs related to Housing barriers as evidenced by patient and/or community resource care guide support    demonstrate ongoing self health care management ability for effective management of chronic conditions as evidenced by     through collaboration with Consulting civil engineer, provider, and care team.   Interventions: 1:1 collaboration with primary care provider regarding development and update of comprehensive plan of care as evidenced by provider attestation and co-signature Inter-disciplinary care team collaboration (see longitudinal plan of care) Evaluation of current treatment plan related to  self management and patient's adherence to plan as established by provider   SDOH Barriers (Status: Goal on track: NO.) Long Term Goal  Patient interviewed and SDOH assessment performed        Patient interviewed and appropriate assessments performed Provided patient with information about checking email as the care guides had sent him a 4 page document with housing resources on it for help with housing in [place]. 03-21-21: His roommate passed away on 19-Mar-2021 and he is currently staying in the home but is leaving. The landlord wants >900.00 and it has mold. The patient states he is not staying there. He does have somewhere to stay until he finds somewhere to go. He has received information in mail about resources for housing and is working on trying to find a new place to live. Discussed plans with patient for ongoing care management follow up and provided patient with direct contact information for care management team Advised patient to look at email sent to the patient last week. To call the office for changes in SDOH, Questions or concerns. Also advised the patient that the [facility] would collaborate  with LCSW for additional support for housing Provided education to patient/caregiver regarding level of care options.    Hyperthyroidism  (Status: Goal on Track (progressing): YES.) Long Term Goal  Evaluation of current treatment plan related to  Hyperthyroidism ,  self-management and patient's adherence to plan as established by provider. 03-10-2021: Denies any issues with his hyperthyroidism. Will continue to monitor Discussed plans with patient for  ongoing care management follow up and provided patient with direct contact information for care management team Advised patient to call the office for changes in conditions, questions or concerns; Provided education to patient re: hyperthyroidism, sx and sx to monitor for ; Reviewed medications with patient and discussed compliance ; Discussed plans with patient for ongoing care management follow up and provided patient with direct contact information for care management team; Screening for signs and symptoms of depression related to chronic disease state;  Assessed social determinant of health barriers;   Hyperlipidemia:  (Status: Goal on Track (progressing): YES.) Lab Results  Component Value Date   CHOL 187 07/02/2020   HDL 53 07/02/2020   LDLCALC 118 (H) 07/02/2020   TRIG 87 07/02/2020   CHOLHDL 3.4 11/08/2017     Medication review performed; medication list updated in electronic medical record.  Provider established cholesterol goals reviewed; Counseled on importance of regular laboratory monitoring as prescribed; Provided HLD educational materials; Reviewed role and benefits of statin for ASCVD risk reduction; Discussed strategies to manage statin-induced myalgias; Reviewed importance of limiting foods high in cholesterol; Reviewed exercise goals and target of 150 minutes per week;  Hypertension: (Status: Goal on Track (progressing): YES.) Last practice recorded BP readings:  BP Readings from Last 3 Encounters:  08/13/20 114/72  07/02/20 104/65  05/21/20 126/77  Most recent eGFR/CrCl:  Lab Results  Component Value Date   EGFR 80 07/02/2020    No components found for: CRCL  Evaluation of current treatment plan related to hypertension self management and patient's adherence to plan as established by provider. 03-10-2021: Denies any issues with heart health. Will continue to monitor.    Provided education to patient re: stroke prevention, s/s of heart attack and  stroke; Reviewed prescribed diet heart healthy Reviewed medications with patient and discussed importance of compliance;  Counseled on adverse effects of illicit drug and excessive alcohol use in patients with high blood pressure;  Discussed plans with patient for ongoing care management follow up and provided patient with direct contact information for care management team; Advised patient, providing education and rationale, to monitor blood pressure daily and record, calling PCP for findings outside established parameters;  Provided education on prescribed diet heart healthy;  Discussed complications of poorly controlled blood pressure such as heart disease, stroke, circulatory complications, vision complications, kidney impairment, sexual dysfunction;   Smoking Cessation: (Status: Patient declined further engagement on this goal.) Long Term Goal  Reviewed smoking history:  tobacco abuse of 42 years; currently smoking 0.25 ppd Previous quit attempts, unsuccessful unknown successful using unknown  Reports smoking within 30 minutes of waking up Reports triggers to smoke include: housing situation, wife in a nursing home, stressors  Reports motivation to quit smoking includes: knows it would be better on his health and well being  On a scale of 1-10, reports MOTIVATION to quit is 0 On a scale of 1-10, reports CONFIDENCE in quitting is 0  Evaluation of current treatment plan reviewed; Advised patient to discuss smoking cessation options with provider; Provided contact information for [place] [facility] (1-800-QUIT-NOW); Discussed plans with patient  for ongoing care management follow up and provided patient with direct contact information for care management team; The patient is not ready to stop smoking at this time   Homelessness   (Status: Goal on track: NO.) Long Term Goal  Evaluation of current treatment plan related to Homelessness, Housing barriers self-management and patient's adherence to  plan as established by provider. Discussed plans with patient for ongoing care management follow up and provided patient with direct contact information for care management team Advised patient to look at email sent to him from the care guides with resources to help with finding stable housing for him and his wife. The pateint called the office today and ask for RNCM to return call to the patient .The patient states that his roommate is in the hospital and he is currently staying in the house but he does not know how long he can stay there. Was reaching out for help. 03-10-2021: The patient has received mailed copy of resources. His roommate died on 2021/03/27 and he has to move from the house. He is staying there until next week. He does not want to live there as there is mold in the home and the landlord wants >900.00 per month. The patient states he has somewhere to go until he can find stable housing. Encouraged the patient to utilize resources he has to find stable housing. Will also update the LCSW. Provided education to patient re: calling resources on the email provided by the care guides to help with housing opitons. ; Collaborated with LCSW for recommendations  regarding patients stated need of housing. ; Social Work referral for additional help and resources for housing needs ; Discussed plans with patient for ongoing care management follow up and provided patient with direct contact information for care management team;   Patient Goals/Self-Care Activities: Patient will self administer medications as prescribed as evidenced by self report/primary caregiver report  Patient will attend all scheduled provider appointments as evidenced by clinician review of documented attendance to scheduled appointments and patient/caregiver report Patient will call pharmacy for medication refills as evidenced by patient report and review of pharmacy fill history as appropriate Patient will attend church or other  social activities as evidenced by patient report Patient will continue to perform ADL's independently as evidenced by patient/caregiver report Patient will continue to perform IADL's independently as evidenced by patient/caregiver report Patient will call provider office for new concerns or questions as evidenced by review of documented incoming telephone call notes and patient report Patient will work with BSW to address care coordination needs and will continue to work with the clinical team to address health care and disease management related needs as evidenced by documented adherence to scheduled care management/care coordination appointments - check blood pressure weekly - choose a place to take my blood pressure (home, clinic or office, retail store) - write blood pressure results in a log or diary - learn about high blood pressure - keep a blood pressure log - take blood pressure log to all doctor appointments - call doctor for signs and symptoms of high blood pressure - develop an action plan for high blood pressure - keep all doctor appointments - take medications for blood pressure exactly as prescribed - report new symptoms to your doctor - eat more whole grains, fruits and vegetables, lean meats and healthy fats - call for medicine refill 2 or 3 days before it runs out - take all medications exactly as prescribed - call doctor with any symptoms  you believe are related to your medicine - call doctor when you experience any new symptoms - go to all doctor appointments as scheduled - adhere to prescribed diet: Heart Healthy        Plan:Telephone follow up appointment with care management team member scheduled for:  05-12-2021 at 45 am  [place], MSN, [facility] Mobile: [phone]

## 2021-03-12 ENCOUNTER — Other Ambulatory Visit: Payer: Self-pay | Admitting: Family Medicine

## 2021-03-12 NOTE — Telephone Encounter (Signed)
Requested medications are due for refill today.  yes  Requested medications are on the active medications list.  yes  Last refill. Tapazole 04/14/2020, Proylthiouracil 07/25/2020  Future visit scheduled.   In 10 months  Notes to clinic.  Neither medication is delegated.    Requested Prescriptions  Pending Prescriptions Disp Refills   methimazole (TAPAZOLE) 10 MG tablet [Pharmacy Med Name: METHIMAZOLE 10MG  TABLETS] 360 tablet 1    Sig: TAKE 2 TABLETS(20 MG) BY MOUTH TWICE DAILY     Not Delegated - Endocrinology:  Hyperthyroid Agents Failed - 03/12/2021  3:13 AM      Failed - This refill cannot be delegated      Failed - TSH in normal range and within 180 days    TSH  Date Value Ref Range Status  08/13/2020 <0.005 (L) 0.450 - 4.500 uIU/mL Final          Passed - Valid encounter within last 6 months    Recent Outpatient Visits           7 months ago Hyperthyroidism   [place], Megan P, DO   8 months ago Hyperthyroidism   [place], [place], DO   9 months ago Hyperthyroidism   [place], [place], DO   11 months ago Hyperthyroidism   [employer], Geraldine, DO   1 year ago Hyperthyroidism   [place], Barb Merino, DO       Future Appointments             Tomorrow Stoioff, Ronda Fairly, MD [place]   In 10 months  [place], [facility]             propylthiouracil (PTU) 50 MG tablet [Pharmacy Med Name: PROPYLTHIOURACIL 50MG  TABLETS] 540 tablet 1    Sig: TAKE 2 TABLETS BY MOUTH THREE TIMES DAILY     Not Delegated - Endocrinology:  Hyperthyroid Agents Failed - 03/12/2021  3:13 AM      Failed - This refill cannot be delegated      Failed - TSH in normal range and within 180 days    TSH  Date Value Ref Range Status  08/13/2020 <0.005 (L) 0.450 - 4.500 uIU/mL Final          Passed - Valid encounter within last 6 months    Recent  Outpatient Visits           7 months ago Hyperthyroidism   [place], Megan P, DO   8 months ago Hyperthyroidism   [place], [place], DO   9 months ago Hyperthyroidism   [employer], [place], DO   11 months ago Hyperthyroidism   [employer], [place], DO   1 year ago Hyperthyroidism   [place], Barb Merino, DO       Future Appointments             Tomorrow Stoioff, Ronda Fairly, MD [place]   In 10 months  [facility], [place]

## 2021-03-13 ENCOUNTER — Ambulatory Visit: Payer: Self-pay | Admitting: Urology

## 2021-03-13 DIAGNOSIS — R066 Hiccough: Secondary | ICD-10-CM | POA: Diagnosis not present

## 2021-03-13 NOTE — Telephone Encounter (Signed)
Needs an appointment. Will get him enough medicine to make it to appointment when it's booked.   

## 2021-03-13 NOTE — Telephone Encounter (Signed)
Unable to leave a vm to schedule pt an appt.

## 2021-03-14 ENCOUNTER — Telehealth: Payer: Self-pay | Admitting: Urology

## 2021-03-14 DIAGNOSIS — N2889 Other specified disorders of kidney and ureter: Secondary | ICD-10-CM

## 2021-03-14 NOTE — Telephone Encounter (Signed)
Past due for follow-up renal mass MRI.  The order had expired.  I placed a new order and he needs a follow-up visit to discuss results.

## 2021-03-16 NOTE — Telephone Encounter (Signed)
Pt is scheduled 03/19/2021 at 8:20

## 2021-03-19 ENCOUNTER — Other Ambulatory Visit: Payer: Self-pay

## 2021-03-19 ENCOUNTER — Ambulatory Visit (INDEPENDENT_AMBULATORY_CARE_PROVIDER_SITE_OTHER): Payer: Medicare HMO | Admitting: Family Medicine

## 2021-03-19 ENCOUNTER — Encounter: Payer: Self-pay | Admitting: Family Medicine

## 2021-03-19 VITALS — BP 91/59 | HR 98 | Temp 98.0°F | Wt 138.4 lb

## 2021-03-19 DIAGNOSIS — E0590 Thyrotoxicosis, unspecified without thyrotoxic crisis or storm: Secondary | ICD-10-CM | POA: Diagnosis not present

## 2021-03-19 DIAGNOSIS — D7589 Other specified diseases of blood and blood-forming organs: Secondary | ICD-10-CM | POA: Diagnosis not present

## 2021-03-19 DIAGNOSIS — E042 Nontoxic multinodular goiter: Secondary | ICD-10-CM

## 2021-03-19 DIAGNOSIS — Z720 Tobacco use: Secondary | ICD-10-CM | POA: Diagnosis not present

## 2021-03-19 DIAGNOSIS — N401 Enlarged prostate with lower urinary tract symptoms: Secondary | ICD-10-CM | POA: Diagnosis not present

## 2021-03-19 DIAGNOSIS — E041 Nontoxic single thyroid nodule: Secondary | ICD-10-CM

## 2021-03-19 DIAGNOSIS — Z23 Encounter for immunization: Secondary | ICD-10-CM | POA: Diagnosis not present

## 2021-03-19 DIAGNOSIS — Z0000 Encounter for general adult medical examination without abnormal findings: Secondary | ICD-10-CM | POA: Diagnosis not present

## 2021-03-19 DIAGNOSIS — R350 Frequency of micturition: Secondary | ICD-10-CM | POA: Diagnosis not present

## 2021-03-19 DIAGNOSIS — I700 Atherosclerosis of aorta: Secondary | ICD-10-CM

## 2021-03-19 DIAGNOSIS — E7800 Pure hypercholesterolemia, unspecified: Secondary | ICD-10-CM

## 2021-03-19 LAB — URINALYSIS, ROUTINE W REFLEX MICROSCOPIC
Bilirubin, UA: NEGATIVE
Glucose, UA: NEGATIVE
Nitrite, UA: NEGATIVE
Specific Gravity, UA: 1.015 (ref 1.005–1.030)
Urobilinogen, Ur: 1 mg/dL (ref 0.2–1.0)
pH, UA: 6 (ref 5.0–7.5)

## 2021-03-19 LAB — MICROSCOPIC EXAMINATION
Bacteria, UA: NONE SEEN
RBC, Urine: NONE SEEN /hpf (ref 0–2)

## 2021-03-19 MED ORDER — ATORVASTATIN CALCIUM 20 MG PO TABS: ORAL_TABLET | ORAL | 1 refills | Status: DC

## 2021-03-19 MED ORDER — ALBUTEROL SULFATE HFA 108 (90 BASE) MCG/ACT IN AERS: 2.0000 | INHALATION_SPRAY | Freq: Four times a day (QID) | RESPIRATORY_TRACT | 6 refills | Status: DC | PRN

## 2021-03-19 MED ORDER — PROPYLTHIOURACIL 50 MG PO TABS: 100.0000 mg | ORAL_TABLET | Freq: Three times a day (TID) | ORAL | 1 refills | Status: DC

## 2021-03-19 MED ORDER — METHIMAZOLE 10 MG PO TABS: ORAL_TABLET | ORAL | 1 refills | Status: DC

## 2021-03-19 MED ORDER — TAMSULOSIN HCL 0.4 MG PO CAPS: ORAL_CAPSULE | ORAL | 1 refills | Status: DC

## 2021-03-19 NOTE — Assessment & Plan Note (Signed)
Will repeat US. Has refused to see endocrine. Await results. Treat as needed.

## 2021-03-19 NOTE — Assessment & Plan Note (Signed)
Under good control on current regimen. Continue current regimen. Continue to monitor. Call with any concerns. Refills given. Labs drawn today.   

## 2021-03-19 NOTE — Assessment & Plan Note (Signed)
Rechecking labs today. Await results. Treat as needed.  °

## 2021-03-19 NOTE — Progress Notes (Signed)
BP (!) 91/59    Pulse 98    Temp 98 F (36.7 C)    Wt 138 lb 6.4 oz (62.8 kg)    SpO2 97%    BMI 18.26 kg/m    Subjective:    Patient ID: Tyler Cohen, male    DOB: 02-21-1956, 65 y.o.   MRN: [id number]  HPI: Tyler Cohen is a 65 y.o. male presenting on 03/19/2021 for comprehensive medical examination. Current medical complaints include:  HYPERTHYROIDISM Thyroid control status: unknown Satisfied with current treatment? yes Medication side effects: no Medication compliance: fair compliance Recent dose adjustment:no Fatigue: no Cold intolerance: no Heat intolerance: no Weight gain: no Weight loss: no Constipation: no Diarrhea/loose stools: no Palpitations: no Lower extremity edema: no Anxiety/depressed mood: no  HYPERLIPIDEMIA Hyperlipidemia status: excellent compliance Satisfied with current treatment?  yes Side effects:  no Medication compliance: excellent compliance Past cholesterol meds: atorvastatin Supplements: none Aspirin:  yes The 10-year ASCVD risk score (Arnett DK, et al., 2019) is: 9%   Values used to calculate the score:     Age: 35 years     Sex: Male     Is Non-Hispanic African American: Yes     Diabetic: No     Tobacco smoker: Yes     Systolic Blood Pressure: 91 mmHg     Is BP treated: No     HDL Cholesterol: 53 mg/dL     Total Cholesterol: 187 mg/dL Chest pain:  no  Interim Problems from his last visit: no  Depression Screen done today and results listed below:  Depression screen [facility] 2/9 01/09/2021 01/07/2021 08/13/2020 01/14/2020 01/07/2020  Decreased Interest 0 0 0 0 0  Down, Depressed, Hopeless 0 0 0 0 0  PHQ - 2 Score 0 0 0 0 0  Altered sleeping - - - - -  Tired, decreased energy - - - - -  Change in appetite - - - - -  Feeling bad or failure about yourself  - - - - -  Trouble concentrating - - - - -  Moving slowly or fidgety/restless - - - - -  Suicidal thoughts - - - - -  PHQ-9 Score - - - - -  Difficult doing work/chores - - -  - -    Past Medical History:  Past Medical History:  Diagnosis Date   Arthritis    Heart murmur    Hyperlipidemia    Macrocytosis    Thyroid disease    Tobacco abuse     Surgical History:  Past Surgical History:  Procedure Laterality Date   COLONOSCOPY WITH PROPOFOL N/A 12/01/2015   Procedure: COLONOSCOPY WITH PROPOFOL;  Surgeon: Lucilla Lame, MD;  Location: [place];  Service: Endoscopy;  Laterality: N/A;   POLYPECTOMY  12/01/2015   Procedure: POLYPECTOMY;  Surgeon: Lucilla Lame, MD;  Location: [place];  Service: Endoscopy;;    Medications:  Current Outpatient Medications on File Prior to Visit  Medication Sig   albuterol (VENTOLIN HFA) 108 (90 Base) MCG/ACT inhaler INHALE 2 PUFFS INTO THE LUNGS EVERY 6 HOURS AS NEEDED FOR WHEEZING OR SHORTNESS OF BREATH   aspirin 81 MG tablet Take 81 mg by mouth daily.   atorvastatin (LIPITOR) 20 MG tablet TAKE 1 TABLET(20 MG) BY MOUTH AT BEDTIME   b complex vitamins tablet Take 1 tablet by mouth daily.   clobetasol ointment (TEMOVATE) 4.09 % Apply 1 application topically 2 (two) times daily.   clotrimazole-betamethasone (LOTRISONE) cream Apply 1 application  topically 2 (two) times daily.   lidocaine (XYLOCAINE) 5 % ointment Apply 1 application topically as needed.   methimazole (TAPAZOLE) 10 MG tablet TAKE 2 TABLETS(20 MG) BY MOUTH TWICE DAILY   metoCLOPramide (REGLAN) 10 MG tablet Take by mouth.   propylthiouracil (PTU) 50 MG tablet TAKE 2 TABLETS BY MOUTH THREE TIMES DAILY   tamsulosin (FLOMAX) 0.4 MG CAPS capsule TAKE 1 CAPSULE(0.4 MG) BY MOUTH DAILY   No current facility-administered medications on file prior to visit.    Allergies:  No Known Allergies  Social History:  Social History   Socioeconomic History   Marital status: Married    Spouse name: Not on file   Number of children: Not on file   Years of education: Not on file   Highest education level: High school graduate  Occupational History   Not  on file  Tobacco Use   Smoking status: Former    Packs/day: 0.25    Years: 42.00    Pack years: 10.50    Types: Cigars, Cigarettes   Smokeless tobacco: Never   Tobacco comments:    4 cigars a day   Vaping Use   Vaping Use: Never used  Substance and Sexual Activity   Alcohol use: No   Drug use: Yes    Types: Marijuana    Comment: pt states he smokes every once in a while   Sexual activity: Yes    Birth control/protection: None  Other Topics Concern   Not on file  Social History Narrative   Works part time.   Social Determinants of Health   Financial Resource Strain: Medium Risk   Difficulty of Paying Living Expenses: Somewhat hard  Food Insecurity: No Food Insecurity   Worried About Charity fundraiser in the Last Year: Never true   Ran Out of Food in the Last Year: Never true  Transportation Needs: No Transportation Needs   Lack of Transportation (Medical): No   Lack of Transportation (Non-Medical): No  Physical Activity: Sufficiently Active   Days of Exercise per Week: 3 days   Minutes of Exercise per Session: 120 min  Stress: No Stress Concern Present   Feeling of Stress : Not at all  Social Connections: Socially Integrated   Frequency of Communication with Friends and Family: More than three times a week   Frequency of Social Gatherings with Friends and Family: More than three times a week   Attends Religious Services: More than 4 times per year   Active Member of [employer] or Organizations: Yes   Attends Music therapist: More than 4 times per year   Marital Status: Married  Human resources officer Violence: Not At Risk   Fear of Current or Ex-Partner: No   Emotionally Abused: No   Physically Abused: No   Sexually Abused: No   Social History   Tobacco Use  Smoking Status Former   Packs/day: 0.25   Years: 42.00   Pack years: 10.50   Types: Cigars, Cigarettes  Smokeless Tobacco Never  Tobacco Comments   4 cigars a day    Social History   Substance  and Sexual Activity  Alcohol Use No    Family History:  Family History  Problem Relation Age of Onset   Hypertension Mother    Diabetes Mother        lost both legs   Kidney disease Mother    Hypertension Father    Emphysema Father    Sickle cell trait Father    Hypertension  Brother    Hyperlipidemia Brother    Sickle cell trait Sister     Past medical history, surgical history, medications, allergies, family history and social history reviewed with patient today and changes made to appropriate areas of the chart.   Review of Systems  Constitutional: Negative.   HENT: Negative.    Eyes: Negative.   Respiratory: Negative.    Cardiovascular: Negative.   Gastrointestinal: Negative.   Genitourinary: Negative.   Musculoskeletal: Negative.   Skin: Negative.   Neurological:  Positive for dizziness. Negative for tingling, tremors, sensory change, speech change, focal weakness, seizures, loss of consciousness, weakness and headaches.  Endo/Heme/Allergies: Negative.   Psychiatric/Behavioral: Negative.    All other ROS negative except what is listed above and in the HPI.      Objective:    BP (!) 91/59    Pulse 98    Temp 98 F (36.7 C)    Wt 138 lb 6.4 oz (62.8 kg)    SpO2 97%    BMI 18.26 kg/m   Wt Readings from Last 3 Encounters:  03/19/21 138 lb 6.4 oz (62.8 kg)  01/09/21 155 lb (70.3 kg)  08/13/20 150 lb 12.8 oz (68.4 kg)    Physical Exam Vitals and nursing note reviewed.  Constitutional:      General: He is not in acute distress.    Appearance: Normal appearance. He is not ill-appearing, toxic-appearing or diaphoretic.     Comments: Very thin  HENT:     Head: Normocephalic and atraumatic.      Right Ear: Tympanic membrane, ear canal and external ear normal. There is no impacted cerumen.     Left Ear: Tympanic membrane, ear canal and external ear normal. There is no impacted cerumen.     Nose: Nose normal. No congestion or rhinorrhea.     Mouth/Throat:     Mouth:  Mucous membranes are moist.     Pharynx: Oropharynx is clear. No oropharyngeal exudate or posterior oropharyngeal erythema.  Eyes:     General: No scleral icterus.       Right eye: No discharge.        Left eye: No discharge.     Extraocular Movements: Extraocular movements intact.     Conjunctiva/sclera: Conjunctivae normal.     Pupils: Pupils are equal, round, and reactive to light.  Neck:     Vascular: No carotid bruit.  Cardiovascular:     Rate and Rhythm: Normal rate and regular rhythm.     Pulses: Normal pulses.     Heart sounds: No murmur heard.   No friction rub. No gallop.  Pulmonary:     Effort: Pulmonary effort is normal. No respiratory distress.     Breath sounds: Normal breath sounds. No stridor. No wheezing, rhonchi or rales.  Chest:     Chest wall: No tenderness.  Abdominal:     General: Abdomen is flat. Bowel sounds are normal. There is no distension.     Palpations: Abdomen is soft. There is no mass.     Tenderness: There is no abdominal tenderness. There is no right CVA tenderness, left CVA tenderness, guarding or rebound.     Hernia: No hernia is present.  Genitourinary:    Comments: Genital exam deferred with shared decision making Musculoskeletal:        General: No swelling, tenderness, deformity or signs of injury.     Cervical back: Normal range of motion and neck supple. No rigidity. No muscular tenderness.  Right lower leg: No edema.     Left lower leg: No edema.  Lymphadenopathy:     Cervical: No cervical adenopathy.  Skin:    General: Skin is warm and dry.     Capillary Refill: Capillary refill takes less than 2 seconds.     Coloration: Skin is not jaundiced or pale.     Findings: No bruising, erythema, lesion or rash.  Neurological:     General: No focal deficit present.     Mental Status: He is alert and oriented to person, place, and time.     Cranial Nerves: No cranial nerve deficit.     Sensory: No sensory deficit.     Motor: No  weakness.     Coordination: Coordination normal.     Gait: Gait normal.     Deep Tendon Reflexes: Reflexes normal.  Psychiatric:        Mood and Affect: Mood normal.        Behavior: Behavior normal.        Thought Content: Thought content normal.        Judgment: Judgment normal.    Results for orders placed or performed in visit on 08/13/20  Thyroid Panel With TSH  Result Value Ref Range   TSH <0.005 (L) 0.450 - 4.500 uIU/mL   T4, Total 13.7 (H) 4.5 - 12.0 ug/dL   T3 Uptake Ratio 43 (H) 24 - 39 %   Free Thyroxine Index 5.9 (H) 1.2 - 4.9  PSA  Result Value Ref Range   Prostate Specific Ag, Serum 8.2 (H) 0.0 - 4.0 ng/mL      Assessment & Plan:   Problem List Items Addressed This Visit       Cardiovascular and Mediastinum   Aortic atherosclerosis ([place])    Will keep BP and cholesterol under good control. Continue to monitor. Call with any concerns.       Relevant Orders   Comprehensive metabolic panel   Lipid Panel w/o Chol/HDL Ratio     Endocrine   Left thyroid nodule    Will repeat US. Has refused to see endocrine. Await results. Treat as needed.       Relevant Orders   US THYROID   Multinodular goiter    Will repeat US. Has refused to see endocrine. Await results. Treat as needed.       Relevant Orders   US THYROID   Hyperthyroidism    Will repeat US and labs. Has refused to see endocrine. Await results. Treat as needed.       Relevant Orders   Comprehensive metabolic panel   TSH     Genitourinary   BPH (benign prostatic hyperplasia)    Under good control on current regimen. Continue current regimen. Continue to monitor. Call with any concerns. Refills given. Labs drawn today.       Relevant Orders   PSA     Other   Hyperlipidemia    Under good control on current regimen. Continue current regimen. Continue to monitor. Call with any concerns. Refills given. Labs drawn today.       Relevant Orders   Comprehensive metabolic panel   Tobacco  abuse    Has not smoked in 8 days!      Relevant Orders   Comprehensive metabolic panel   Urinalysis, Routine w reflex microscopic   Macrocytosis    Rechecking labs today. Await results. Treat as needed.       Relevant Orders   Comprehensive metabolic panel  CBC with Differential/Platelet   Other Visit Diagnoses     Routine general medical examination at a health care facility    -  Primary   Vaccines up to date. Screening labs checked today. Colonoscopy up to date. Continue diet and exercise. Call with any concerns.         Discussed aspirin prophylaxis for myocardial infarction prevention and decision was made to continue ASA  LABORATORY TESTING:  Health maintenance labs ordered today as discussed above.   The natural history of prostate cancer and ongoing controversy regarding screening and potential treatment outcomes of prostate cancer has been discussed with the patient. The meaning of a false positive PSA and a false negative PSA has been discussed. He indicates understanding of the limitations of this screening test and wishes  to proceed with screening PSA testing.   IMMUNIZATIONS:   - Tdap: Tetanus vaccination status reviewed: last tetanus booster within 10 years. - Influenza: Up to date - Pneumovax: Up to date - Prevnar: Administered today - COVID: Up to date - HPV: Not applicable - Shingrix vaccine: Given elsewhere  SCREENING: - Colonoscopy: Up to date  Discussed with patient purpose of the colonoscopy is to detect colon cancer at curable precancerous or early stages   PATIENT COUNSELING:    Sexuality: Discussed sexually transmitted diseases, partner selection, use of condoms, avoidance of unintended pregnancy  and contraceptive alternatives.   Advised to avoid cigarette smoking.  I discussed with the patient that most people either abstain from alcohol or drink within safe limits (<=14/week and <=4 drinks/occasion for males, <=7/weeks and <= 3  drinks/occasion for females) and that the risk for alcohol disorders and other health effects rises proportionally with the number of drinks per week and how often a drinker exceeds daily limits.  Discussed cessation/primary prevention of drug use and availability of treatment for abuse.   Diet: Encouraged to adjust caloric intake to maintain  or achieve ideal body weight, to reduce intake of dietary saturated fat and total fat, to limit sodium intake by avoiding high sodium foods and not adding table salt, and to maintain adequate dietary potassium and calcium preferably from fresh fruits, vegetables, and low-fat dairy products.    stressed the importance of regular exercise  Injury prevention: Discussed safety belts, safety helmets, smoke detector, smoking near bedding or upholstery.   Dental health: Discussed importance of regular tooth brushing, flossing, and dental visits.   Follow up plan: NEXT PREVENTATIVE PHYSICAL DUE IN 1 YEAR. Return in about 6 months (around 09/17/2021).

## 2021-03-19 NOTE — Assessment & Plan Note (Signed)
Will repeat US and labs. Has refused to see endocrine. Await results. Treat as needed.

## 2021-03-19 NOTE — Assessment & Plan Note (Signed)
Will keep BP and cholesterol under good control. Continue to monitor. Call with any concerns.  

## 2021-03-19 NOTE — Assessment & Plan Note (Signed)
Has not smoked in 8 days!

## 2021-03-20 ENCOUNTER — Other Ambulatory Visit: Payer: Self-pay | Admitting: Family Medicine

## 2021-03-20 DIAGNOSIS — E7800 Pure hypercholesterolemia, unspecified: Secondary | ICD-10-CM | POA: Diagnosis not present

## 2021-03-20 DIAGNOSIS — Z720 Tobacco use: Secondary | ICD-10-CM | POA: Diagnosis not present

## 2021-03-20 DIAGNOSIS — Z23 Encounter for immunization: Secondary | ICD-10-CM

## 2021-03-20 DIAGNOSIS — E042 Nontoxic multinodular goiter: Secondary | ICD-10-CM | POA: Diagnosis not present

## 2021-03-20 DIAGNOSIS — D7589 Other specified diseases of blood and blood-forming organs: Secondary | ICD-10-CM | POA: Diagnosis not present

## 2021-03-20 DIAGNOSIS — R972 Elevated prostate specific antigen [PSA]: Secondary | ICD-10-CM

## 2021-03-20 DIAGNOSIS — E0590 Thyrotoxicosis, unspecified without thyrotoxic crisis or storm: Secondary | ICD-10-CM

## 2021-03-20 DIAGNOSIS — I700 Atherosclerosis of aorta: Secondary | ICD-10-CM | POA: Diagnosis not present

## 2021-03-20 DIAGNOSIS — Z0000 Encounter for general adult medical examination without abnormal findings: Secondary | ICD-10-CM | POA: Diagnosis not present

## 2021-03-20 DIAGNOSIS — N401 Enlarged prostate with lower urinary tract symptoms: Secondary | ICD-10-CM | POA: Diagnosis not present

## 2021-03-20 LAB — COMPREHENSIVE METABOLIC PANEL
ALT: 59 IU/L — ABNORMAL HIGH (ref 0–44)
AST: 27 IU/L (ref 0–40)
Albumin/Globulin Ratio: 1.2 (ref 1.2–2.2)
Albumin: 3.6 g/dL — ABNORMAL LOW (ref 3.8–4.8)
Alkaline Phosphatase: 78 IU/L (ref 44–121)
BUN/Creatinine Ratio: 17 (ref 10–24)
BUN: 16 mg/dL (ref 8–27)
Bilirubin Total: 0.4 mg/dL (ref 0.0–1.2)
CO2: 21 mmol/L (ref 20–29)
Calcium: 9.1 mg/dL (ref 8.6–10.2)
Chloride: 101 mmol/L (ref 96–106)
Creatinine, Ser: 0.95 mg/dL (ref 0.76–1.27)
Globulin, Total: 3.1 g/dL (ref 1.5–4.5)
Glucose: 91 mg/dL (ref 70–99)
Potassium: 4.7 mmol/L (ref 3.5–5.2)
Sodium: 136 mmol/L (ref 134–144)
Total Protein: 6.7 g/dL (ref 6.0–8.5)
eGFR: 89 mL/min/{1.73_m2} (ref >59)

## 2021-03-20 LAB — LIPID PANEL W/O CHOL/HDL RATIO
Cholesterol, Total: 113 mg/dL (ref 100–199)
HDL: 33 mg/dL — ABNORMAL LOW (ref >39)
LDL Chol Calc (NIH): 65 mg/dL (ref 0–99)
Triglycerides: 70 mg/dL (ref 0–149)
VLDL Cholesterol Cal: 15 mg/dL (ref 5–40)

## 2021-03-20 LAB — CBC WITH DIFFERENTIAL/PLATELET
Basophils Absolute: 0 10*3/uL (ref 0.0–0.2)
Basos: 0 %
EOS (ABSOLUTE): 0 10*3/uL (ref 0.0–0.4)
Eos: 0 %
Hematocrit: 41.8 % (ref 37.5–51.0)
Hemoglobin: 14 g/dL (ref 13.0–17.7)
Immature Grans (Abs): 0 10*3/uL (ref 0.0–0.1)
Immature Granulocytes: 0 %
Lymphocytes Absolute: 2 10*3/uL (ref 0.7–3.1)
Lymphs: 21 %
MCH: 31.6 pg (ref 26.6–33.0)
MCHC: 33.5 g/dL (ref 31.5–35.7)
MCV: 94 fL (ref 79–97)
Monocytes Absolute: 0.8 10*3/uL (ref 0.1–0.9)
Monocytes: 8 %
Neutrophils Absolute: 6.6 10*3/uL (ref 1.4–7.0)
Neutrophils: 71 %
Platelets: 273 10*3/uL (ref 150–450)
RBC: 4.43 x10E6/uL (ref 4.14–5.80)
RDW: 11.9 % (ref 11.6–15.4)
WBC: 9.4 10*3/uL (ref 3.4–10.8)

## 2021-03-20 LAB — TSH: TSH: <0.005 u[IU]/mL — ABNORMAL LOW (ref 0.450–4.500)

## 2021-03-20 LAB — PSA: Prostate Specific Ag, Serum: 9.6 ng/mL — ABNORMAL HIGH (ref 0.0–4.0)

## 2021-03-23 ENCOUNTER — Telehealth: Payer: Medicare HMO

## 2021-03-23 ENCOUNTER — Telehealth: Payer: Self-pay | Admitting: Licensed Clinical Social Worker

## 2021-03-23 NOTE — Telephone Encounter (Signed)
° °   Clinical Social Work  Care Management   Phone Outreach    03/23/2021 Name: Tyler Cohen MRN: [id number] DOB: Aug 24, 1955  Tyler Cohen is a 65 y.o. year old male who is a primary care patient of Valerie Roys, DO .   Reason for referral: [employer] .    F/U phone call today to assess needs, progress and barriers with care plan goals.   Telephone outreach was unsuccessful. Unable to leave a HIPPA compliant phone message due to voice mail not set up.  Plan:CCM LCSW will wait for return call. If no return call is received, LCSW will make another attempt within 30 days  Review of patient status, including review of consultants reports, relevant laboratory and other test results, and collaboration with appropriate care team members and the patient's provider was performed as part of comprehensive patient evaluation and provision of care management services.    Christa See, MSW, [email or web address] Phone [phone] 11:22 AM

## 2021-03-26 ENCOUNTER — Ambulatory Visit: Payer: Medicare HMO | Admitting: Urology

## 2021-03-27 ENCOUNTER — Ambulatory Visit: Admission: RE | Admit: 2021-03-27 | Payer: Medicare HMO | Source: Ambulatory Visit

## 2021-04-04 DIAGNOSIS — E7800 Pure hypercholesterolemia, unspecified: Secondary | ICD-10-CM

## 2021-04-04 DIAGNOSIS — I10 Essential (primary) hypertension: Secondary | ICD-10-CM | POA: Diagnosis not present

## 2021-04-09 ENCOUNTER — Telehealth: Payer: Self-pay | Admitting: Licensed Clinical Social Worker

## 2021-04-09 ENCOUNTER — Telehealth: Payer: Medicare HMO

## 2021-04-09 NOTE — Telephone Encounter (Signed)
° °   Clinical Social Work  Care Management   Phone Outreach    04/09/2021 Name: WORTH KOBER MRN: [id number] DOB: [birth year]  [name] is a 66 y.o. year old male who is a primary care patient of Valerie Roys, DO .   Reason for referral: [employer] .    A HIPPA compliant phone message was left for the patient providing contact information and requesting a return call.  2nd unsuccessful telephone outreach attempt.  If unable to reach patient by phone on the 3rd attempt, will discontinue outreach calls but will be available at any time to provide services.   Plan: CCM LCSW will wait for return call. If no return call is received, an appointment will be rescheduled with CCM LCSW.  Review of patient status, including review of consultants reports, relevant laboratory and other test results, and collaboration with appropriate care team members and the patient's provider was performed as part of comprehensive patient evaluation and provision of care management services.    Christa See, MSW, [email or web address] Phone [phone] 4:06 PM

## 2021-04-10 ENCOUNTER — Ambulatory Visit
Admission: RE | Admit: 2021-04-10 | Discharge: 2021-04-10 | Disposition: A | Discharge status: Home / Self Care | Payer: Medicare HMO | Source: Ambulatory Visit | Attending: Urology | Admitting: Urology

## 2021-04-10 ENCOUNTER — Other Ambulatory Visit: Payer: Self-pay

## 2021-04-10 DIAGNOSIS — N2889 Other specified disorders of kidney and ureter: Secondary | ICD-10-CM | POA: Diagnosis not present

## 2021-04-10 DIAGNOSIS — N281 Cyst of kidney, acquired: Secondary | ICD-10-CM | POA: Diagnosis not present

## 2021-04-10 DIAGNOSIS — K7689 Other specified diseases of liver: Secondary | ICD-10-CM | POA: Diagnosis not present

## 2021-04-10 DIAGNOSIS — K5730 Diverticulosis of large intestine without perforation or abscess without bleeding: Secondary | ICD-10-CM | POA: Diagnosis not present

## 2021-04-10 MED ORDER — GADOBUTROL 1 MMOL/ML IV SOLN
6.0000 mL | Freq: Once | INTRAVENOUS | Status: AC | PRN
  Administered 2021-04-10: 6 mL via INTRAVENOUS

## 2021-04-16 ENCOUNTER — Other Ambulatory Visit: Payer: Self-pay

## 2021-04-16 ENCOUNTER — Encounter: Payer: Self-pay | Admitting: Urology

## 2021-04-16 ENCOUNTER — Ambulatory Visit (INDEPENDENT_AMBULATORY_CARE_PROVIDER_SITE_OTHER): Payer: Medicare HMO | Admitting: Urology

## 2021-04-16 VITALS — BP 120/70 | HR 70 | Ht 73.0 in | Wt 138.0 lb

## 2021-04-16 DIAGNOSIS — R972 Elevated prostate specific antigen [PSA]: Secondary | ICD-10-CM

## 2021-04-16 DIAGNOSIS — N2889 Other specified disorders of kidney and ureter: Secondary | ICD-10-CM

## 2021-04-16 NOTE — Patient Instructions (Signed)

## 2021-04-16 NOTE — Progress Notes (Signed)
04/16/2021 11:08 AM   Tyler Cohen [birth year] [id number]  Referring provider: Valerie Roys, DO [place],  [place] [address]  Chief Complaint  Patient presents with   Elevated PSA    Urologic history: 1.  Small left renal mass             -Incidentally identified MRI September 2018             -Elected surveillance  HPI: 66 y.o. male who presents for annual follow-up.  He was also referred for evaluation of an elevated PSA.  No complaints since last years visit No bothersome LUTS and denies dysuria, gross hematuria No flank, abdominal or pelvic pain Renal mass protocol MRI performed 04/10/2021 shows a stable and unchanged 1.2 x 1.1 x 1.5 cm left renal mass with low-level enhancement PSA 04/09/2020 was 4.8 and has slowly increased over the past year to 9.6 on 03/19/2021 Urinalysis at that visit was normal     PMH: Past Medical History:  Diagnosis Date   Arthritis    Heart murmur    Hyperlipidemia    Macrocytosis    Thyroid disease    Tobacco abuse     Surgical History: Past Surgical History:  Procedure Laterality Date   COLONOSCOPY WITH PROPOFOL N/A 12/01/2015   Procedure: COLONOSCOPY WITH PROPOFOL;  Surgeon: Lucilla Lame, MD;  Location: [facility];  Service: Endoscopy;  Laterality: N/A;   POLYPECTOMY  12/01/2015   Procedure: POLYPECTOMY;  Surgeon: Lucilla Lame, MD;  Location: [facility];  Service: Endoscopy;;    Home Medications:  Allergies as of 04/16/2021   No Known Allergies      Medication List        Accurate as of April 16, 2021 11:08 AM. If you have any questions, ask your nurse or doctor.          albuterol 108 (90 Base) MCG/ACT inhaler Commonly known as: VENTOLIN HFA Inhale 2 puffs into the lungs every 6 (six) hours as needed for wheezing or shortness of breath.   aspirin 81 MG tablet Take 81 mg by mouth daily.   atorvastatin 20 MG tablet Commonly known as: LIPITOR TAKE 1 TABLET(20 MG) BY MOUTH AT BEDTIME    b complex vitamins tablet Take 1 tablet by mouth daily.   clobetasol ointment 0.05 % Commonly known as: TEMOVATE Apply 1 application topically 2 (two) times daily.   clotrimazole-betamethasone cream Commonly known as: Lotrisone Apply 1 application topically 2 (two) times daily.   lidocaine 5 % ointment Commonly known as: XYLOCAINE Apply 1 application topically as needed.   methimazole 10 MG tablet Commonly known as: TAPAZOLE TAKE 2 TABLETS(20 MG) BY MOUTH TWICE DAILY   metoCLOPramide 10 MG tablet Commonly known as: REGLAN Take by mouth.   propylthiouracil 50 MG tablet Commonly known as: PTU Take 2 tablets (100 mg total) by mouth 3 (three) times daily.   tamsulosin 0.4 MG Caps capsule Commonly known as: FLOMAX TAKE 1 CAPSULE(0.4 MG) BY MOUTH DAILY        Allergies: No Known Allergies  Family History: Family History  Problem Relation Age of Onset   Hypertension Mother    Diabetes Mother        lost both legs   Kidney disease Mother    Hypertension Father    Emphysema Father    Sickle cell trait Father    Hypertension Brother    Hyperlipidemia Brother    Sickle cell trait Sister  Social History:  reports that he has quit smoking. His smoking use included cigars and cigarettes. He has a 10.50 pack-year smoking history. He has never used smokeless tobacco. He reports current drug use. Drug: Marijuana. He reports that he does not drink alcohol.   Physical Exam: BP 120/70    Pulse 70    Ht 6\' 1"  (1.854 m)    Wt 138 lb (62.6 kg)    BMI 18.21 kg/m   Constitutional:  Alert and oriented, No acute distress. HEENT: [place] AT, moist mucus membranes.  Trachea midline, no masses. Cardiovascular: No clubbing, cyanosis, or edema. Respiratory: Normal respiratory effort, no increased work of breathing. GI: Abdomen is soft, nontender, nondistended, no abdominal masses GU: Prostate 40 g, smooth without nodules Skin: No rashes, bruises or suspicious lesions. Neurologic:  Grossly intact, no focal deficits, moving all 4 extremities. Psychiatric: Normal mood and affect.   Assessment & Plan:    1.  Left renal mass Stable on MRI performed earlier this month We again discussed the likelihood of a small renal cell carcinoma and that surveillance is an acceptable option if there is no significant change in size We again reviewed other options including renal mass biopsy, percutaneous cryoablation and laparoscopic excision He desires to continue surveillance and will schedule a surveillance MRI 1 year  2.  Elevated PSA Although PSA is a prostate cancer screening test he was informed that cancer is not the most common cause of an elevated PSA. Other potential causes including BPH and inflammation were discussed. He was informed that the only way to adequately diagnose prostate cancer would be a transrectal ultrasound and biopsy of the prostate. The procedure was discussed including potential risks of bleeding and infection/sepsis. He was also informed that a negative biopsy does not conclusively rule out the possibility that prostate cancer may be present and that continued monitoring is required. The use of newer adjunctive blood tests including 4kScore was discussed. Continued periodic surveillance was also discussed. Based on the continued rise in his PSA have recommended prostate biopsy and he desires to schedule   Abbie Sons, MD  [facility] [address], [place], [place] [address] [phone]

## 2021-05-12 ENCOUNTER — Telehealth: Payer: Medicare HMO

## 2021-05-12 ENCOUNTER — Ambulatory Visit (INDEPENDENT_AMBULATORY_CARE_PROVIDER_SITE_OTHER): Payer: Medicare HMO

## 2021-05-12 DIAGNOSIS — Z5900 Homelessness unspecified: Secondary | ICD-10-CM

## 2021-05-12 DIAGNOSIS — I10 Essential (primary) hypertension: Secondary | ICD-10-CM

## 2021-05-12 DIAGNOSIS — Z59819 Housing instability, housed unspecified: Secondary | ICD-10-CM

## 2021-05-12 DIAGNOSIS — E0590 Thyrotoxicosis, unspecified without thyrotoxic crisis or storm: Secondary | ICD-10-CM

## 2021-05-12 DIAGNOSIS — E7800 Pure hypercholesterolemia, unspecified: Secondary | ICD-10-CM

## 2021-05-12 NOTE — Chronic Care Management (AMB) (Signed)
Chronic Care Management   CCM RN Visit Note  05/12/2021 Name: Tyler Cohen MRN: [id number] DOB: [birth year]  Subjective: Tyler Cohen is a 66 y.o. year old male who is a primary care patient of Valerie Roys, DO. The care management team was consulted for assistance with disease management and care coordination needs.    Engaged with patient by telephone for follow up visit in response to provider referral for case management and/or care coordination services.   Consent to Services:  The patient was given information about Chronic Care Management services, agreed to services, and gave verbal consent prior to initiation of services.  Please see initial visit note for detailed documentation.   Patient agreed to services and verbal consent obtained.   Assessment: Review of patient past medical history, allergies, medications, health status, including review of consultants reports, laboratory and other test data, was performed as part of comprehensive evaluation and provision of chronic care management services.   SDOH (Social Determinants of Health) assessments and interventions performed:    CCM Care Plan  No Known Allergies  Outpatient Encounter Medications as of 05/12/2021  Medication Sig   albuterol (VENTOLIN HFA) 108 (90 Base) MCG/ACT inhaler Inhale 2 puffs into the lungs every 6 (six) hours as needed for wheezing or shortness of breath.   aspirin 81 MG tablet Take 81 mg by mouth daily.   atorvastatin (LIPITOR) 20 MG tablet TAKE 1 TABLET(20 MG) BY MOUTH AT BEDTIME   b complex vitamins tablet Take 1 tablet by mouth daily.   clobetasol ointment (TEMOVATE) 6.94 % Apply 1 application topically 2 (two) times daily.   clotrimazole-betamethasone (LOTRISONE) cream Apply 1 application topically 2 (two) times daily.   lidocaine (XYLOCAINE) 5 % ointment Apply 1 application topically as needed.   methimazole (TAPAZOLE) 10 MG tablet TAKE 2 TABLETS(20 MG) BY MOUTH TWICE DAILY    metoCLOPramide (REGLAN) 10 MG tablet Take by mouth.   propylthiouracil (PTU) 50 MG tablet Take 2 tablets (100 mg total) by mouth 3 (three) times daily.   tamsulosin (FLOMAX) 0.4 MG CAPS capsule TAKE 1 CAPSULE(0.4 MG) BY MOUTH DAILY   No facility-administered encounter medications on file as of 05/12/2021.    Patient Active Problem List   Diagnosis Date Noted   Aortic atherosclerosis ([place]) 06/25/2020   BPH (benign prostatic hyperplasia) 10/27/2018   Multinodular goiter 12/08/2017   Hyperthyroidism 12/08/2017   Left thyroid nodule 05/04/2017   Adrenal mass ([place]) 05/04/2017   Renal mass 01/17/2017   Liver lesion 11/29/2016   Advanced care planning/counseling discussion 11/02/2016   Digital mucinous cyst of finger of right hand 11/02/2016   Benign neoplasm of sigmoid colon    Psoriasis 09/03/2015   Tobacco abuse    Macrocytosis    Hyperlipidemia 08/29/2014   Dental disease 08/29/2014    Conditions to be addressed/monitored:HTN, HLD, and Hyperthyroidism and homelessness  Care Plan : RNCM: General Plan of Care (Adult) for Chronic Disease Management and Care Coordination Needs  Updates made by Vanita Ingles, RN since 05/12/2021 12:00 AM     Problem: RNCM: Development of Plan of Care for Chronic Disease Management (HTN, HLD, Hyperthyroidism, Homelessness, Smoker)   Priority: High     Long-Range Goal: RNCM: Effective Management of Plan of Care for Chronic Disease Management (HTN, HLD, Hyperthyroidism, Homelessness, Smoker)   Start Date: 02/02/2021  Expected End Date: 02/02/2022  Recent Progress: On track  Priority: High  Note:   Current Barriers:  Knowledge Deficits related to plan of care for  management of HTN, HLD, Homelessness, and Hyperthyroidism and smoker   Care Coordination needs related to Limited social support, Housing barriers, and Social Isolation  Chronic Disease Management support and education needs related to HTN, HLD, Homelessness, and Hyperthyroidism, and smoker   Lacks caregiver support.  Film/video editor.  Homeless, living with a roommate who is currently in the hospital, the patient asking for help with housing   RNCM Clinical Goal(s):  Patient will verbalize understanding of plan for management of HTN, HLD, Homelessness, Tobacco Use, and Hyperthyroidism as evidenced by seeing pcp on a regular basis, regular outreaches with the CCM team, following up on resources provided  take all medications exactly as prescribed and will call provider for medication related questions as evidenced by compliance with mediation, no missed doseages     demonstrate understanding of rationale for each prescribed medication as evidenced by taking as prescribed, calling before refills are needed    attend all scheduled medical appointments: with pcp as evidenced by keeping appointments and calling if appointments need to be changed        demonstrate improved and ongoing adherence to prescribed treatment plan for HTN, HLD, Homelessness, Tobacco Use, and Hyperthyroidism  as evidenced by Finding adequate housing, working with the CCM team to meet needs  continue to work with Consulting civil engineer and/or Education officer, museum to address care management and care coordination needs related to HTN, HLD, Tobacco Use, and hyperthyroidism as evidenced by adherence to CM Team Scheduled appointments     work with Education officer, museum to address Financial constraints related to the need for stable housing, Limited social support, and Housing barriers related to the management of HTN, HLD, Homelessness, Tobacco Use, and hyperthyroidism as evidenced by review of EMR and patient or Education officer, museum report     work with Data processing manager care guide to address needs related to Housing barriers as evidenced by patient and/or community resource care guide support    demonstrate ongoing self health care management ability for effective management of chronic conditions as evidenced by     through collaboration with Midwife, provider, and care team.   Interventions: 1:1 collaboration with primary care provider regarding development and update of comprehensive plan of care as evidenced by provider attestation and co-signature Inter-disciplinary care team collaboration (see longitudinal plan of care) Evaluation of current treatment plan related to  self management and patient's adherence to plan as established by provider   SDOH Barriers (Status: Goal on track: NO.) Long Term Goal  Patient interviewed and SDOH assessment performed        Patient interviewed and appropriate assessments performed Provided patient with information about checking email as the care guides had sent him a 4 page document with housing resources on it for help with housing in [place]. 02-Apr-2021: His roommate passed away on 03-31-2021 and he is currently staying in the home but is leaving. The landlord wants >900.00 and it has mold. The patient states he is not staying there. He does have somewhere to stay until he finds somewhere to go. He has received information in mail about resources for housing and is working on trying to find a new place to live. 05-12-2021: The patient is still homeless. His cousin passed away and he is living with different relatives now. He still is looking for stable housing. States he is on the waiting list. Encouraged him to call and see what his status on the waiting list is. The patient wants to find a  house so his wife can come out of the nursing home and back home. Education and support given.  Discussed plans with patient for ongoing care management follow up and provided patient with direct contact information for care management team Advised patient to look at email sent to the patient last week. To call the office for changes in SDOH, Questions or concerns. Also advised the patient that the [facility] would collaborate with LCSW for additional support for housing Provided education to  patient/caregiver regarding level of care options.    Hyperthyroidism  (Status: Goal on Track (progressing): YES.) Long Term Goal  Evaluation of current treatment plan related to  Hyperthyroidism ,  self-management and patient's adherence to plan as established by provider. 05-12-2021: Denies any issues with his hyperthyroidism. Will continue to monitor Discussed plans with patient for ongoing care management follow up and provided patient with direct contact information for care management team Advised patient to call the office for changes in conditions, questions or concerns; Provided education to patient re: hyperthyroidism, sx and sx to monitor for ; Reviewed medications with patient and discussed compliance ; Discussed plans with patient for ongoing care management follow up and provided patient with direct contact information for care management team; Screening for signs and symptoms of depression related to chronic disease state;  Assessed social determinant of health barriers;   Hyperlipidemia:  (Status: Goal on Track (progressing): YES.) Lab Results  Component Value Date   CHOL 113 03/19/2021   HDL 33 (L) 03/19/2021   LDLCALC 65 03/19/2021   TRIG 70 03/19/2021   CHOLHDL 3.4 11/08/2017     Medication review performed; medication list updated in electronic medical record.  Provider established cholesterol goals reviewed. 05-12-2021: Is at goal. Praised for good results; Counseled on importance of regular laboratory monitoring as prescribed. 05-12-2021: Has labs done on a regular basis; Provided HLD educational materials; Reviewed role and benefits of statin for ASCVD risk reduction; Discussed strategies to manage statin-induced myalgias; Reviewed importance of limiting foods high in cholesterol; Reviewed exercise goals and target of 150 minutes per week;  Hypertension: (Status: Goal on Track (progressing): YES.) Last practice recorded BP readings:  BP Readings from Last 3  Encounters:  04/16/21 120/70  03/19/21 (!) 91/59  08/13/20 114/72  Most recent eGFR/CrCl:  Lab Results  Component Value Date   EGFR 89 03/19/2021    No components found for: CRCL  Evaluation of current treatment plan related to hypertension self management and patient's adherence to plan as established by provider. 05-12-2021: Denies any issues with heart health. Will continue to monitor.    Provided education to patient re: stroke prevention, s/s of heart attack and stroke; Reviewed prescribed diet heart healthy Reviewed medications with patient and discussed importance of compliance;  Counseled on adverse effects of illicit drug and excessive alcohol use in patients with high blood pressure;  Discussed plans with patient for ongoing care management follow up and provided patient with direct contact information for care management team; Advised patient, providing education and rationale, to monitor blood pressure daily and record, calling PCP for findings outside established parameters;  Provided education on prescribed diet heart healthy;  Discussed complications of poorly controlled blood pressure such as heart disease, stroke, circulatory complications, vision complications, kidney impairment, sexual dysfunction;   Smoking Cessation: (Status: Patient declined further engagement on this goal.) Long Term Goal  Reviewed smoking history:  tobacco abuse of 42 years; currently smoking 0.25 ppd Previous quit attempts, unsuccessful unknown successful using unknown  Reports  smoking within 30 minutes of waking up Reports triggers to smoke include: housing situation, wife in a nursing home, stressors  Reports motivation to quit smoking includes: knows it would be better on his health and well being  On a scale of 1-10, reports MOTIVATION to quit is 0 On a scale of 1-10, reports CONFIDENCE in quitting is 0  Evaluation of current treatment plan reviewed; Advised patient to discuss smoking  cessation options with provider; Provided contact information for [place] [facility]uit Line (1-800-QUIT-NOW); Discussed plans with patient for ongoing care management follow up and provided patient with direct contact information for care management team; The patient is not ready to stop smoking at this time   Homelessness   (Status: Goal on track: NO.) Long Term Goal  Evaluation of current treatment plan related to Homelessness, Housing barriers self-management and patient's adherence to plan as established by provider. 05-12-2021: The patient states that his cousin died and he is living with other family at this time. The patient states that he is on the waiting list for places but has not heard from anyone. The patient encouraged to call the places and check to see if he has moved up on waiting list. The patient wants to secure a place for him and his wife. His wife currently is still in the nursing home living. Emotional support and education given.  Discussed plans with patient for ongoing care management follow up and provided patient with direct contact information for care management team Advised patient to look at email sent to him from the care guides with resources to help with finding stable housing for him and his wife. The pateint called the office today and ask for RNCM to return call to the patient .The patient states that his roommate is in the hospital and he is currently staying in the house but he does not know how long he can stay there. Was reaching out for help. 03-10-2021: The patient has received mailed copy of resources. His roommate died on 03/10/2021 and he has to move from the house. He is staying there until next week. He does not want to live there as there is mold in the home and the landlord wants >900.00 per month. The patient states he has somewhere to go until he can find stable housing. Encouraged the patient to utilize resources he has to find stable housing. Will also update the  LCSW. Provided education to patient re: calling resources on the email provided by the care guides to help with housing opitons. ; Collaborated with LCSW for recommendations  regarding patients stated need of housing. ; Social Work referral for additional help and resources for housing needs ; Discussed plans with patient for ongoing care management follow up and provided patient with direct contact information for care management team;   Patient Goals/Self-Care Activities: Patient will self administer medications as prescribed as evidenced by self report/primary caregiver report  Patient will attend all scheduled provider appointments as evidenced by clinician review of documented attendance to scheduled appointments and patient/caregiver report Patient will call pharmacy for medication refills as evidenced by patient report and review of pharmacy fill history as appropriate Patient will attend church or other social activities as evidenced by patient report Patient will continue to perform ADL's independently as evidenced by patient/caregiver report Patient will continue to perform IADL's independently as evidenced by patient/caregiver report Patient will call provider office for new concerns or questions as evidenced by review of documented incoming telephone call notes and  patient report Patient will work with BSW to address care coordination needs and will continue to work with the clinical team to address health care and disease management related needs as evidenced by documented adherence to scheduled care management/care coordination appointments - check blood pressure weekly - choose a place to take my blood pressure (home, clinic or office, retail store) - write blood pressure results in a log or diary - learn about high blood pressure - keep a blood pressure log - take blood pressure log to all doctor appointments - call doctor for signs and symptoms of high blood pressure - develop  an action plan for high blood pressure - keep all doctor appointments - take medications for blood pressure exactly as prescribed - report new symptoms to your doctor - eat more whole grains, fruits and vegetables, lean meats and healthy fats - call for medicine refill 2 or 3 days before it runs out - take all medications exactly as prescribed - call doctor with any symptoms you believe are related to your medicine - call doctor when you experience any new symptoms - go to all doctor appointments as scheduled - adhere to prescribed diet: Heart Healthy        Plan:Telephone follow up appointment with care management team member scheduled for:  07-14-2021 at [place] am  Noreene Larsson RN, MSN, [facility] Mobile: [phone]

## 2021-05-12 NOTE — Patient Instructions (Signed)
Visit Information  Thank you for taking time to visit with me today. Please don't hesitate to contact me if I can be of assistance to you before our next scheduled telephone appointment.  Following are the goals we discussed today:  RNCM Clinical Goal(s):  Patient will verbalize understanding of plan for management of HTN, HLD, Homelessness, Tobacco Use, and Hyperthyroidism as evidenced by seeing pcp on a regular basis, regular outreaches with the CCM team, following up on resources provided  take all medications exactly as prescribed and will call provider for medication related questions as evidenced by compliance with mediation, no missed doseages     demonstrate understanding of rationale for each prescribed medication as evidenced by taking as prescribed, calling before refills are needed    attend all scheduled medical appointments: with pcp as evidenced by keeping appointments and calling if appointments need to be changed        demonstrate improved and ongoing adherence to prescribed treatment plan for HTN, HLD, Homelessness, Tobacco Use, and Hyperthyroidism  as evidenced by Finding adequate housing, working with the CCM team to meet needs  continue to work with Consulting civil engineer and/or Education officer, museum to address care management and care coordination needs related to HTN, HLD, Tobacco Use, and hyperthyroidism as evidenced by adherence to CM Team Scheduled appointments     work with Education officer, museum to address Financial constraints related to the need for stable housing, Limited social support, and Housing barriers related to the management of HTN, HLD, Homelessness, Tobacco Use, and hyperthyroidism as evidenced by review of EMR and patient or Education officer, museum report     work with Data processing manager care guide to address needs related to Housing barriers as evidenced by patient and/or community resource care guide support    demonstrate ongoing self health care management ability for effective management  of chronic conditions as evidenced by     through collaboration with Consulting civil engineer, provider, and care team.    Interventions: 1:1 collaboration with primary care provider regarding development and update of comprehensive plan of care as evidenced by provider attestation and co-signature Inter-disciplinary care team collaboration (see longitudinal plan of care) Evaluation of current treatment plan related to  self management and patient's adherence to plan as established by provider     SDOH Barriers (Status: Goal on track: NO.) Long Term Goal  Patient interviewed and SDOH assessment performed        Patient interviewed and appropriate assessments performed Provided patient with information about checking email as the care guides had sent him a 4 page document with housing resources on it for help with housing in [place]. 2021/03/22: His roommate passed away on 03-20-2021 and he is currently staying in the home but is leaving. The landlord wants >900.00 and it has mold. The patient states he is not staying there. He does have somewhere to stay until he finds somewhere to go. He has received information in mail about resources for housing and is working on trying to find a new place to live. 05-12-2021: The patient is still homeless. His cousin passed away and he is living with different relatives now. He still is looking for stable housing. States he is on the waiting list. Encouraged him to call and see what his status on the waiting list is. The patient wants to find a house so his wife can come out of the nursing home and back home. Education and support given.  Discussed plans with patient for ongoing  care management follow up and provided patient with direct contact information for care management team Advised patient to look at email sent to the patient last week. To call the office for changes in SDOH, Questions or concerns. Also advised the patient that the [facility] would collaborate with  LCSW for additional support for housing Provided education to patient/caregiver regarding level of care options.       Hyperthyroidism  (Status: Goal on Track (progressing): YES.) Long Term Goal  Evaluation of current treatment plan related to  Hyperthyroidism ,  self-management and patient's adherence to plan as established by provider. 05-12-2021: Denies any issues with his hyperthyroidism. Will continue to monitor Discussed plans with patient for ongoing care management follow up and provided patient with direct contact information for care management team Advised patient to call the office for changes in conditions, questions or concerns; Provided education to patient re: hyperthyroidism, sx and sx to monitor for ; Reviewed medications with patient and discussed compliance ; Discussed plans with patient for ongoing care management follow up and provided patient with direct contact information for care management team; Screening for signs and symptoms of depression related to chronic disease state;  Assessed social determinant of health barriers;    Hyperlipidemia:  (Status: Goal on Track (progressing): YES.)      Lab Results  Component Value Date    CHOL 113 03/19/2021    HDL 33 (L) 03/19/2021    LDLCALC 65 03/19/2021    TRIG 70 03/19/2021    CHOLHDL 3.4 11/08/2017      Medication review performed; medication list updated in electronic medical record.  Provider established cholesterol goals reviewed. 05-12-2021: Is at goal. Praised for good results; Counseled on importance of regular laboratory monitoring as prescribed. 05-12-2021: Has labs done on a regular basis; Provided HLD educational materials; Reviewed role and benefits of statin for ASCVD risk reduction; Discussed strategies to manage statin-induced myalgias; Reviewed importance of limiting foods high in cholesterol; Reviewed exercise goals and target of 150 minutes per week;   Hypertension: (Status: Goal on Track  (progressing): YES.) Last practice recorded BP readings:     BP Readings from Last 3 Encounters:  04/16/21 120/70  03/19/21 (!) 91/59  08/13/20 114/72  Most recent eGFR/CrCl:       Lab Results  Component Value Date    EGFR 89 03/19/2021    No components found for: CRCL   Evaluation of current treatment plan related to hypertension self management and patient's adherence to plan as established by provider. 05-12-2021: Denies any issues with heart health. Will continue to monitor.    Provided education to patient re: stroke prevention, s/s of heart attack and stroke; Reviewed prescribed diet heart healthy Reviewed medications with patient and discussed importance of compliance;  Counseled on adverse effects of illicit drug and excessive alcohol use in patients with high blood pressure;  Discussed plans with patient for ongoing care management follow up and provided patient with direct contact information for care management team; Advised patient, providing education and rationale, to monitor blood pressure daily and record, calling PCP for findings outside established parameters;  Provided education on prescribed diet heart healthy;  Discussed complications of poorly controlled blood pressure such as heart disease, stroke, circulatory complications, vision complications, kidney impairment, sexual dysfunction;    Smoking Cessation: (Status: Patient declined further engagement on this goal.) Long Term Goal  Reviewed smoking history:  tobacco abuse of 42 years; currently smoking 0.25 ppd Previous quit attempts, unsuccessful unknown successful using unknown  Reports smoking within 30 minutes of waking up Reports triggers to smoke include: housing situation, wife in a nursing home, stressors  Reports motivation to quit smoking includes: knows it would be better on his health and well being  On a scale of 1-10, reports MOTIVATION to quit is 0 On a scale of 1-10, reports CONFIDENCE in quitting  is 0   Evaluation of current treatment plan reviewed; Advised patient to discuss smoking cessation options with provider; Provided contact information for [place] [facility] (1-800-QUIT-NOW); Discussed plans with patient for ongoing care management follow up and provided patient with direct contact information for care management team; The patient is not ready to stop smoking at this time     Homelessness   (Status: Goal on track: NO.) Long Term Goal  Evaluation of current treatment plan related to Homelessness, Housing barriers self-management and patient's adherence to plan as established by provider. 05-12-2021: The patient states that his cousin died and he is living with other family at this time. The patient states that he is on the waiting list for places but has not heard from anyone. The patient encouraged to call the places and check to see if he has moved up on waiting list. The patient wants to secure a place for him and his wife. His wife currently is still in the nursing home living. Emotional support and education given.  Discussed plans with patient for ongoing care management follow up and provided patient with direct contact information for care management team Advised patient to look at email sent to him from the care guides with resources to help with finding stable housing for him and his wife. The pateint called the office today and ask for RNCM to return call to the patient .The patient states that his roommate is in the hospital and he is currently staying in the house but he does not know how long he can stay there. Was reaching out for help. 03-10-2021: The patient has received mailed copy of resources. His roommate died on April 01, 2021 and he has to move from the house. He is staying there until next week. He does not want to live there as there is mold in the home and the landlord wants >900.00 per month. The patient states he has somewhere to go until he can find stable housing.  Encouraged the patient to utilize resources he has to find stable housing. Will also update the LCSW. Provided education to patient re: calling resources on the email provided by the care guides to help with housing opitons. ; Collaborated with LCSW for recommendations  regarding patients stated need of housing. ; Social Work referral for additional help and resources for housing needs ; Discussed plans with patient for ongoing care management follow up and provided patient with direct contact information for care management team;    Patient Goals/Self-Care Activities: Patient will self administer medications as prescribed as evidenced by self report/primary caregiver report  Patient will attend all scheduled provider appointments as evidenced by clinician review of documented attendance to scheduled appointments and patient/caregiver report Patient will call pharmacy for medication refills as evidenced by patient report and review of pharmacy fill history as appropriate Patient will attend church or other social activities as evidenced by patient report Patient will continue to perform ADL's independently as evidenced by patient/caregiver report Patient will continue to perform IADL's independently as evidenced by patient/caregiver report Patient will call provider office for new concerns or questions as evidenced by review of documented  incoming telephone call notes and patient report Patient will work with BSW to address care coordination needs and will continue to work with the clinical team to address health care and disease management related needs as evidenced by documented adherence to scheduled care management/care coordination appointments - check blood pressure weekly - choose a place to take my blood pressure (home, clinic or office, retail store) - write blood pressure results in a log or diary - learn about high blood pressure - keep a blood pressure log - take blood pressure log to  all doctor appointments - call doctor for signs and symptoms of high blood pressure - develop an action plan for high blood pressure - keep all doctor appointments - take medications for blood pressure exactly as prescribed - report new symptoms to your doctor - eat more whole grains, fruits and vegetables, lean meats and healthy fats - call for medicine refill 2 or 3 days before it runs out - take all medications exactly as prescribed - call doctor with any symptoms you believe are related to your medicine - call doctor when you experience any new symptoms - go to all doctor appointments as scheduled - adhere to prescribed diet: Heart Healthy       Our next appointment is by telephone on 07-14-2021 at 1145 am   Please call the care guide team at [phone] if you need to cancel or reschedule your appointment.   If you are experiencing a Mental Health or [place] or need someone to talk to, please call the Suicide and Crisis Lifeline: 988 call the [place] National Suicide Prevention Lifeline: 780-137-4531 or TTY: (414)228-0074 TTY 2127999216) to talk to a trained counselor call 1-800-273-TALK (toll free, 24 hour hotline)   The patient verbalized understanding of instructions, educational materials, and care plan provided today and declined offer to receive copy of patient instructions, educational materials, and care plan.   Noreene Larsson RN, MSN, [facility] Mobile: [phone]

## 2021-05-13 ENCOUNTER — Emergency Department: Payer: Medicare HMO

## 2021-05-13 ENCOUNTER — Encounter: Payer: Self-pay | Admitting: Emergency Medicine

## 2021-05-13 ENCOUNTER — Ambulatory Visit: Payer: Self-pay | Admitting: *Deleted

## 2021-05-13 ENCOUNTER — Other Ambulatory Visit: Payer: Self-pay

## 2021-05-13 ENCOUNTER — Inpatient Hospital Stay
Admission: EM | Admit: 2021-05-13 | Discharge: 2021-05-16 | DRG: 643 | Disposition: A | Discharge status: Other Acute Inpatient Hospital | Payer: Medicare HMO | Source: Ambulatory Visit | Attending: Pulmonary Disease | Admitting: Pulmonary Disease

## 2021-05-13 DIAGNOSIS — E785 Hyperlipidemia, unspecified: Secondary | ICD-10-CM | POA: Diagnosis present

## 2021-05-13 DIAGNOSIS — R918 Other nonspecific abnormal finding of lung field: Secondary | ICD-10-CM | POA: Diagnosis not present

## 2021-05-13 DIAGNOSIS — J9601 Acute respiratory failure with hypoxia: Secondary | ICD-10-CM | POA: Diagnosis present

## 2021-05-13 DIAGNOSIS — E0591 Thyrotoxicosis, unspecified with thyrotoxic crisis or storm: Secondary | ICD-10-CM | POA: Diagnosis not present

## 2021-05-13 DIAGNOSIS — N179 Acute kidney failure, unspecified: Secondary | ICD-10-CM | POA: Diagnosis present

## 2021-05-13 DIAGNOSIS — I340 Nonrheumatic mitral (valve) insufficiency: Secondary | ICD-10-CM | POA: Diagnosis present

## 2021-05-13 DIAGNOSIS — E039 Hypothyroidism, unspecified: Secondary | ICD-10-CM | POA: Diagnosis present

## 2021-05-13 DIAGNOSIS — Z833 Family history of diabetes mellitus: Secondary | ICD-10-CM

## 2021-05-13 DIAGNOSIS — I471 Supraventricular tachycardia: Secondary | ICD-10-CM | POA: Diagnosis present

## 2021-05-13 DIAGNOSIS — E440 Moderate protein-calorie malnutrition: Secondary | ICD-10-CM | POA: Insufficient documentation

## 2021-05-13 DIAGNOSIS — R1110 Vomiting, unspecified: Secondary | ICD-10-CM | POA: Diagnosis not present

## 2021-05-13 DIAGNOSIS — N400 Enlarged prostate without lower urinary tract symptoms: Secondary | ICD-10-CM | POA: Diagnosis present

## 2021-05-13 DIAGNOSIS — Z8249 Family history of ischemic heart disease and other diseases of the circulatory system: Secondary | ICD-10-CM

## 2021-05-13 DIAGNOSIS — I5031 Acute diastolic (congestive) heart failure: Secondary | ICD-10-CM | POA: Diagnosis not present

## 2021-05-13 DIAGNOSIS — J189 Pneumonia, unspecified organism: Secondary | ICD-10-CM | POA: Diagnosis not present

## 2021-05-13 DIAGNOSIS — J90 Pleural effusion, not elsewhere classified: Secondary | ICD-10-CM

## 2021-05-13 DIAGNOSIS — Z87891 Personal history of nicotine dependence: Secondary | ICD-10-CM

## 2021-05-13 DIAGNOSIS — Z8349 Family history of other endocrine, nutritional and metabolic diseases: Secondary | ICD-10-CM | POA: Diagnosis not present

## 2021-05-13 DIAGNOSIS — I441 Atrioventricular block, second degree: Secondary | ICD-10-CM | POA: Diagnosis present

## 2021-05-13 DIAGNOSIS — N2889 Other specified disorders of kidney and ureter: Secondary | ICD-10-CM | POA: Diagnosis present

## 2021-05-13 DIAGNOSIS — E041 Nontoxic single thyroid nodule: Secondary | ICD-10-CM | POA: Diagnosis not present

## 2021-05-13 DIAGNOSIS — I110 Hypertensive heart disease with heart failure: Secondary | ICD-10-CM | POA: Diagnosis present

## 2021-05-13 DIAGNOSIS — E0510 Thyrotoxicosis with toxic single thyroid nodule without thyrotoxic crisis or storm: Secondary | ICD-10-CM | POA: Diagnosis not present

## 2021-05-13 DIAGNOSIS — Z841 Family history of disorders of kidney and ureter: Secondary | ICD-10-CM

## 2021-05-13 DIAGNOSIS — I5021 Acute systolic (congestive) heart failure: Secondary | ICD-10-CM | POA: Diagnosis present

## 2021-05-13 DIAGNOSIS — M1990 Unspecified osteoarthritis, unspecified site: Secondary | ICD-10-CM | POA: Diagnosis present

## 2021-05-13 DIAGNOSIS — E0521 Thyrotoxicosis with toxic multinodular goiter with thyrotoxic crisis or storm: Principal | ICD-10-CM | POA: Diagnosis present

## 2021-05-13 DIAGNOSIS — Z825 Family history of asthma and other chronic lower respiratory diseases: Secondary | ICD-10-CM

## 2021-05-13 DIAGNOSIS — E0520 Thyrotoxicosis with toxic multinodular goiter without thyrotoxic crisis or storm: Secondary | ICD-10-CM

## 2021-05-13 DIAGNOSIS — R140 Abdominal distension (gaseous): Secondary | ICD-10-CM | POA: Diagnosis not present

## 2021-05-13 DIAGNOSIS — I420 Dilated cardiomyopathy: Secondary | ICD-10-CM | POA: Diagnosis not present

## 2021-05-13 DIAGNOSIS — Z7901 Long term (current) use of anticoagulants: Secondary | ICD-10-CM

## 2021-05-13 DIAGNOSIS — I4892 Unspecified atrial flutter: Secondary | ICD-10-CM | POA: Diagnosis present

## 2021-05-13 DIAGNOSIS — R7401 Elevation of levels of liver transaminase levels: Secondary | ICD-10-CM | POA: Diagnosis present

## 2021-05-13 DIAGNOSIS — J439 Emphysema, unspecified: Secondary | ICD-10-CM | POA: Diagnosis not present

## 2021-05-13 DIAGNOSIS — R0602 Shortness of breath: Secondary | ICD-10-CM | POA: Diagnosis not present

## 2021-05-13 DIAGNOSIS — Z7984 Long term (current) use of oral hypoglycemic drugs: Secondary | ICD-10-CM

## 2021-05-13 DIAGNOSIS — J180 Bronchopneumonia, unspecified organism: Secondary | ICD-10-CM | POA: Diagnosis present

## 2021-05-13 DIAGNOSIS — I5020 Unspecified systolic (congestive) heart failure: Secondary | ICD-10-CM | POA: Diagnosis not present

## 2021-05-13 DIAGNOSIS — I509 Heart failure, unspecified: Secondary | ICD-10-CM | POA: Diagnosis not present

## 2021-05-13 DIAGNOSIS — Z9114 Patient's other noncompliance with medication regimen: Secondary | ICD-10-CM

## 2021-05-13 DIAGNOSIS — R112 Nausea with vomiting, unspecified: Secondary | ICD-10-CM | POA: Diagnosis present

## 2021-05-13 DIAGNOSIS — Z20822 Contact with and (suspected) exposure to covid-19: Secondary | ICD-10-CM | POA: Diagnosis present

## 2021-05-13 DIAGNOSIS — Z832 Family history of diseases of the blood and blood-forming organs and certain disorders involving the immune mechanism: Secondary | ICD-10-CM

## 2021-05-13 DIAGNOSIS — I2721 Secondary pulmonary arterial hypertension: Secondary | ICD-10-CM | POA: Diagnosis present

## 2021-05-13 DIAGNOSIS — E279 Disorder of adrenal gland, unspecified: Secondary | ICD-10-CM | POA: Diagnosis present

## 2021-05-13 DIAGNOSIS — R570 Cardiogenic shock: Secondary | ICD-10-CM | POA: Diagnosis not present

## 2021-05-13 DIAGNOSIS — J811 Chronic pulmonary edema: Secondary | ICD-10-CM | POA: Diagnosis not present

## 2021-05-13 DIAGNOSIS — E739 Lactose intolerance, unspecified: Secondary | ICD-10-CM | POA: Diagnosis present

## 2021-05-13 DIAGNOSIS — Z7982 Long term (current) use of aspirin: Secondary | ICD-10-CM

## 2021-05-13 DIAGNOSIS — I517 Cardiomegaly: Secondary | ICD-10-CM | POA: Diagnosis not present

## 2021-05-13 DIAGNOSIS — Z79899 Other long term (current) drug therapy: Secondary | ICD-10-CM

## 2021-05-13 HISTORY — DX: Thyrotoxicosis, unspecified with thyrotoxic crisis or storm: E05.91

## 2021-05-13 LAB — BASIC METABOLIC PANEL
Anion gap: 6 (ref 5–15)
BUN: 20 mg/dL (ref 8–23)
CO2: 22 mmol/L (ref 22–32)
Calcium: 8.9 mg/dL (ref 8.9–10.3)
Chloride: 107 mmol/L (ref 98–111)
Creatinine, Ser: 1.13 mg/dL (ref 0.61–1.24)
GFR, Estimated: >60 mL/min (ref >60)
Glucose, Bld: 130 mg/dL — ABNORMAL HIGH (ref 70–99)
Potassium: 4.2 mmol/L (ref 3.5–5.1)
Sodium: 135 mmol/L (ref 135–145)

## 2021-05-13 LAB — CBC
HCT: 40.8 % (ref 39.0–52.0)
Hemoglobin: 13.6 g/dL (ref 13.0–17.0)
MCH: 31.4 pg (ref 26.0–34.0)
MCHC: 33.3 g/dL (ref 30.0–36.0)
MCV: 94.2 fL (ref 80.0–100.0)
Platelets: 164 10*3/uL (ref 150–400)
RBC: 4.33 MIL/uL (ref 4.22–5.81)
RDW: 12.8 % (ref 11.5–15.5)
WBC: 7.7 10*3/uL (ref 4.0–10.5)
nRBC: 0 % (ref 0.0–0.2)

## 2021-05-13 LAB — HEPATIC FUNCTION PANEL
ALT: 119 U/L — ABNORMAL HIGH (ref 0–44)
AST: 65 U/L — ABNORMAL HIGH (ref 15–41)
Albumin: 3.6 g/dL (ref 3.5–5.0)
Alkaline Phosphatase: 65 U/L (ref 38–126)
Bilirubin, Direct: 0.2 mg/dL (ref 0.0–0.2)
Indirect Bilirubin: 0.9 mg/dL (ref 0.3–0.9)
Total Bilirubin: 1.1 mg/dL (ref 0.3–1.2)
Total Protein: 6.6 g/dL (ref 6.5–8.1)

## 2021-05-13 LAB — BRAIN NATRIURETIC PEPTIDE: B Natriuretic Peptide: 988.2 pg/mL — ABNORMAL HIGH (ref 0.0–100.0)

## 2021-05-13 LAB — TROPONIN I (HIGH SENSITIVITY)
Troponin I (High Sensitivity): 18 ng/L — ABNORMAL HIGH (ref <18)
Troponin I (High Sensitivity): 16 ng/L (ref <18)

## 2021-05-13 LAB — RESP PANEL BY RT-PCR (FLU A&B, COVID) ARPGX2
Influenza A by PCR: NEGATIVE
Influenza B by PCR: NEGATIVE
SARS Coronavirus 2 by RT PCR: NEGATIVE

## 2021-05-13 LAB — TSH: TSH: <0.01 u[IU]/mL — ABNORMAL LOW (ref 0.350–4.500)

## 2021-05-13 LAB — T4, FREE: Free T4: 3.49 ng/dL — ABNORMAL HIGH (ref 0.61–1.12)

## 2021-05-13 MED ORDER — PROPRANOLOL HCL 1 MG/ML IV SOLN
1.0000 mg | Freq: Once | INTRAVENOUS | Status: AC
  Administered 2021-05-13: 1 mg via INTRAVENOUS
  Filled 2021-05-13: qty 1

## 2021-05-13 MED ORDER — PROPYLTHIOURACIL 50 MG PO TABS
500.0000 mg | ORAL_TABLET | Freq: Three times a day (TID) | ORAL | Status: DC
  Administered 2021-05-13: 500 mg via ORAL
  Filled 2021-05-13 (×4): qty 10

## 2021-05-13 MED ORDER — SODIUM CHLORIDE 0.9 % IV BOLUS
500.0000 mL | Freq: Once | INTRAVENOUS | Status: AC
Start: 2021-05-13 — End: 2021-05-13
  Administered 2021-05-13: 500 mL via INTRAVENOUS

## 2021-05-13 MED ORDER — HYDROCORTISONE SOD SUC (PF) 100 MG IJ SOLR
100.0000 mg | Freq: Once | INTRAMUSCULAR | Status: AC
  Administered 2021-05-13: 100 mg via INTRAVENOUS

## 2021-05-13 MED ORDER — HYDROCORTISONE SOD SUC (PF) 100 MG IJ SOLR
50.0000 mg | Freq: Four times a day (QID) | INTRAMUSCULAR | Status: DC
  Administered 2021-05-14 (×2): 50 mg via INTRAVENOUS
  Filled 2021-05-13 (×2): qty 2

## 2021-05-13 MED ORDER — SODIUM CHLORIDE 0.9 % IV SOLN
100.0000 mg | Freq: Once | INTRAVENOUS | Status: AC
  Administered 2021-05-13: 100 mg via INTRAVENOUS
  Filled 2021-05-13: qty 100

## 2021-05-13 MED ORDER — PROPYLTHIOURACIL 50 MG PO TABS
200.0000 mg | ORAL_TABLET | ORAL | Status: DC
  Administered 2021-05-14 – 2021-05-15 (×13): 200 mg via ORAL
  Filled 2021-05-13 (×15): qty 4

## 2021-05-13 MED ORDER — POLYETHYLENE GLYCOL 3350 17 G PO PACK: 17.0000 g | PACK | Freq: Every day | ORAL | Status: DC | PRN

## 2021-05-13 MED ORDER — FUROSEMIDE 10 MG/ML IJ SOLN
20.0000 mg | Freq: Once | INTRAMUSCULAR | Status: AC
  Administered 2021-05-13: 20 mg via INTRAVENOUS
  Filled 2021-05-13: qty 4

## 2021-05-13 MED ORDER — SODIUM CHLORIDE 0.9 % IV BOLUS
1000.0000 mL | Freq: Once | INTRAVENOUS | Status: AC
  Administered 2021-05-13: 1000 mL via INTRAVENOUS

## 2021-05-13 MED ORDER — HYDROCORTISONE SOD SUC (PF) 100 MG IJ SOLR
100.0000 mg | Freq: Four times a day (QID) | INTRAMUSCULAR | Status: DC
  Filled 2021-05-13: qty 2

## 2021-05-13 MED ORDER — LORAZEPAM 2 MG/ML IJ SOLN
1.0000 mg | Freq: Once | INTRAMUSCULAR | Status: AC
  Administered 2021-05-13: 1 mg via INTRAVENOUS
  Filled 2021-05-13: qty 1

## 2021-05-13 MED ORDER — PROPRANOLOL HCL 20 MG PO TABS
20.0000 mg | ORAL_TABLET | Freq: Once | ORAL | Status: AC
  Administered 2021-05-13: 20 mg via ORAL
  Filled 2021-05-13: qty 1

## 2021-05-13 MED ORDER — ENOXAPARIN SODIUM 40 MG/0.4ML IJ SOSY
40.0000 mg | PREFILLED_SYRINGE | INTRAMUSCULAR | Status: DC
  Administered 2021-05-14: 40 mg via SUBCUTANEOUS
  Filled 2021-05-13: qty 0.4

## 2021-05-13 MED ORDER — SODIUM CHLORIDE 0.9 % IV SOLN
2.0000 g | Freq: Once | INTRAVENOUS | Status: AC
Start: 2021-05-13 — End: 2021-05-13
  Administered 2021-05-13: 2 g via INTRAVENOUS
  Filled 2021-05-13: qty 20

## 2021-05-13 MED ORDER — MORPHINE SULFATE (PF) 4 MG/ML IV SOLN
4.0000 mg | INTRAVENOUS | Status: DC | PRN
  Administered 2021-05-13 – 2021-05-15 (×4): 4 mg via INTRAVENOUS
  Filled 2021-05-13 (×4): qty 1

## 2021-05-13 MED ORDER — DOCUSATE SODIUM 100 MG PO CAPS: 100.0000 mg | ORAL_CAPSULE | Freq: Two times a day (BID) | ORAL | Status: DC | PRN

## 2021-05-13 MED ORDER — ONDANSETRON HCL 4 MG/2ML IJ SOLN
4.0000 mg | Freq: Once | INTRAMUSCULAR | Status: AC
Start: 2021-05-13 — End: 2021-05-13
  Administered 2021-05-13: 4 mg via INTRAVENOUS
  Filled 2021-05-13: qty 2

## 2021-05-13 MED ORDER — CHOLESTYRAMINE 4 G PO PACK
4.0000 g | PACK | Freq: Three times a day (TID) | ORAL | Status: DC
  Administered 2021-05-14 – 2021-05-15 (×7): 4 g via ORAL
  Filled 2021-05-13 (×8): qty 1

## 2021-05-13 MED ORDER — PROPYLTHIOURACIL 50 MG PO TABS: 500.0000 mg | ORAL_TABLET | Freq: Three times a day (TID) | ORAL | Status: DC

## 2021-05-13 NOTE — ED Provider Notes (Signed)
Patient has been accepted to [facility]  I discussed with Dr. Illene Labrador via telephone.  Transportation will be arranged.  I reviewed labs, which do show possible high-output failure which is somewhat concerning though clinically he appears fairly well.  PTU has been ordered and will give a dose of hydrocortisone as well.  Heart rate seems to have improved with propanolol.  Patient remains tachycardic, borderline hypoxic after receiving medications.  He has been accepted to [facility] but no bed will be available for at least the next 12 hours.  Given patient's critical illness, feel he would best be managed in the ICU at this time.  ICU team consulted and will admit.  Patient given stress dose of hydrocortisone, PTU, as well as empiric antibiotics for possible right basilar pneumonia noted on chest x-ray, though I suspect this could be related to edema.  He was given a dose of Lasix.  CRITICAL CARE Performed by: Evonnie Pat   Total critical care time: 45 minutes  Critical care time was exclusive of separately billable procedures and treating other patients.  Critical care was necessary to treat or prevent imminent or life-threatening deterioration.  Critical care was time spent personally by me on the following activities: development of treatment plan with patient and/or surrogate as well as nursing, discussions with consultants, evaluation of patient's response to treatment, examination of patient, obtaining history from patient or surrogate, ordering and performing treatments and interventions, ordering and review of laboratory studies, ordering and review of radiographic studies, pulse oximetry and re-evaluation of patient's condition.    Duffy Bruce, MD 05/14/21 [phone]

## 2021-05-13 NOTE — ED Triage Notes (Signed)
C/O SOB and dry cough since last night. Denies fever.  Sent to ED from [facility] for evaluation.  Patient is AAOx3.  Skin warm and dry. No SOB/ DOE. NAD

## 2021-05-13 NOTE — Telephone Encounter (Signed)
°  Chief Complaint: request appt. Chest tightness , difficulty breathing  Symptoms: shortness of breath with exertion and laying down. Chest tightness, cough mild productive, inhaler is not working  Frequency: since last night  Pertinent Negatives: Patient denies chest pain heaviness, pressure, no dizziness, no sweating, no fever Disposition: [] ED /[x] Urgent Care (no appt availability in office) / [] Appointment(In office/virtual)/ []  [facility]/ [] Home Care/ [] Refused Recommended Disposition /[] [place]/ []  Follow-up with PCP Additional Notes:   Requesting appt today. None available with PCP .  Please advise if appt available    Reason for Disposition  [1] MILD difficulty breathing (e.g., minimal/no SOB at rest, SOB with walking, pulse <100) AND [2] NEW-onset or WORSE than normal  Answer Assessment - Initial Assessment Questions 1. RESPIRATORY STATUS: "Describe your breathing?" (e.g., wheezing, shortness of breath, unable to speak, severe coughing)      Shortness of breath with exertion chest tightness 2. ONSET: "When did this breathing problem begin?"      Last night  3. PATTERN "Does the difficult breathing come and go, or has it been constant since it started?"      Constant  4. SEVERITY: "How bad is your breathing?" (e.g., mild, moderate, severe)    - MILD: No SOB at rest, mild SOB with walking, speaks normally in sentences, can lie down, no retractions, pulse < 100.    - MODERATE: SOB at rest, SOB with minimal exertion and prefers to sit, cannot lie down flat, speaks in phrases, mild retractions, audible wheezing, pulse 100-120.    - SEVERE: Very SOB at rest, speaks in single words, struggling to breathe, sitting hunched forward, retractions, pulse > 120      moderate 5. RECURRENT SYMPTOM: "Have you had difficulty breathing before?" If Yes, ask: "When was the last time?" and "What happened that time?"      Na  6. CARDIAC HISTORY: "Do you have any history of  heart disease?" (e.g., heart attack, angina, bypass surgery, angioplasty)      na 7. LUNG HISTORY: "Do you have any history of lung disease?"  (e.g., pulmonary embolus, asthma, emphysema)     na 8. CAUSE: "What do you think is causing the breathing problem?"      Not sure  9. OTHER SYMPTOMS: "Do you have any other symptoms? (e.g., dizziness, runny nose, cough, chest pain, fever)     Cough , chest pain tightness, shortness of breath with exertion 10. O2 SATURATION MONITOR:  "Do you use an oxygen saturation monitor (pulse oximeter) at home?" If Yes, "What is your reading (oxygen level) today?" "What is your usual oxygen saturation reading?" (e.g., 95%)       na 11. PREGNANCY: "Is there any chance you are pregnant?" "When was your last menstrual period?"       na 12. TRAVEL: "Have you traveled out of the country in the last month?" (e.g., travel history, exposures)       na  Protocols used: Breathing Difficulty-A-AH

## 2021-05-13 NOTE — ED Provider Notes (Addendum)
[facility] Provider Note    Event Date/Time   First MD Initiated Contact with Patient 05/13/21 1222     (approximate)   History   Shortness of Breath   HPI  Tyler Cohen is a 66 y.o. male with a history of hypothyroidism presents to the ER for evaluation of shortness of breath starting yesterday evening.  Patient states has been compliant with his medications denies any fevers or chills.  Has had some nausea with 1 episode of vomiting this morning.  Denies any new medications denies any recent illnesses.  No history of heart troubles.    US thyroid 2019: IMPRESSION: 1. Findings suggestive of multinodular goiter. 2. Nodules #1 and #4 both meet imaging criteria to recommend a 1 year follow-up as clinically indicated.   Physical Exam   Triage Vital Signs: ED Triage Vitals  Enc Vitals Group     BP 05/13/21 1219 112/85     Pulse Rate 05/13/21 1219 (!) 150     Resp 05/13/21 1219 20     Temp 05/13/21 1219 98.8 F (37.1 C)     Temp src --      SpO2 05/13/21 1219 98 %     Weight 05/13/21 1208 138 lb 0.1 oz (62.6 kg)     Height 05/13/21 1208 6\' 1"  (1.854 m)     Head Circumference --      Peak Flow --      Pain Score --      Pain Loc --      Pain Edu? --      Excl. in [place]? --     Most recent vital signs: Vitals:   05/13/21 1219 05/13/21 1520  BP: 112/85 115/74  Pulse: (!) 150 (!) 136  Resp: 20 18  Temp: 98.8 F (37.1 C) 98.5 F (36.9 C)  SpO2: 98% 97%     Constitutional: Alert  Eyes: Conjunctivae are normal.  Head: Atraumatic. Nose: No congestion/rhinnorhea. Mouth/Throat: Mucous membranes are moist.   Neck: Painless ROM.  Cardiovascular:   tachycardic but well perfused, Good peripheral circulation. Respiratory: Normal respiratory effort.  No retractions bibasilar crackles Gastrointestinal: Soft and nontender.  Musculoskeletal:  no deformity Neurologic:  MAE spontaneously. No gross focal neurologic deficits are appreciated.   Skin:  Skin is warm, dry and intact. No rash noted. Psychiatric: Mood and affect are normal. Speech and behavior are normal.    ED Results / Procedures / Treatments   Labs (all labs ordered are listed, but only abnormal results are displayed) Labs Reviewed  BASIC METABOLIC PANEL - Abnormal; Notable for the following components:      Result Value   Glucose, Bld 130 (*)    All other components within normal limits  BRAIN NATRIURETIC PEPTIDE - Abnormal; Notable for the following components:   B Natriuretic Peptide 988.2 (*)    All other components within normal limits  TSH - Abnormal; Notable for the following components:   TSH <0.010 (*)    All other components within normal limits  T4, FREE - Abnormal; Notable for the following components:   Free T4 3.49 (*)    All other components within normal limits  HEPATIC FUNCTION PANEL - Abnormal; Notable for the following components:   AST 65 (*)    ALT 119 (*)    All other components within normal limits  TROPONIN I (HIGH SENSITIVITY) - Abnormal; Notable for the following components:   Troponin I (High Sensitivity) 18 (*)  All other components within normal limits  RESP PANEL BY RT-PCR (FLU A&B, COVID) ARPGX2  CBC  T3, FREE  TROPONIN I (HIGH SENSITIVITY)     EKG  ED ECG REPORT I, Merlyn Lot, the attending physician, personally viewed and interpreted this ECG.   Date: 05/13/2021  EKG Time: 12:14  Rate: 150  Rhythm: aflutter 2:1  Axis: normal  Intervals:  normal  ST&T Change: nonspecific st abn likley rate dependent    RADIOLOGY Please see ED Course for my review and interpretation.  I personally reviewed all radiographic images ordered to evaluate for the above acute complaints and reviewed radiology reports and findings.  These findings were personally discussed with the patient.  Please see medical record for radiology report.    PROCEDURES:  Critical Care performed: Yes, see critical care procedure  note(s)  .Critical Care Performed by: Merlyn Lot, MD Authorized by: Merlyn Lot, MD   Critical care provider statement:    Critical care time (minutes):  35   Critical care was necessary to treat or prevent imminent or life-threatening deterioration of the following conditions:  Circulatory failure   Critical care was time spent personally by me on the following activities:  Ordering and performing treatments and interventions, ordering and review of laboratory studies, ordering and review of radiographic studies, pulse oximetry, re-evaluation of patient's condition, review of old charts, obtaining history from patient or surrogate, examination of patient, evaluation of patient's response to treatment, discussions with primary provider, discussions with consultants and development of treatment plan with patient or surrogate   MEDICATIONS ORDERED IN ED: Medications  morphine (PF) 4 MG/ML injection 4 mg (4 mg Intravenous Given 05/13/21 1303)  propranolol (INDERAL) injection 1 mg (has no administration in time range)  propylthiouracil (PTU) tablet 500 mg (has no administration in time range)  ondansetron (ZOFRAN) injection 4 mg (4 mg Intravenous Given 05/13/21 1301)  sodium chloride 0.9 % bolus 1,000 mL (0 mLs Intravenous Stopped 05/13/21 1441)  propranolol (INDERAL) injection 1 mg (1 mg Intravenous Given 05/13/21 1441)     IMPRESSION / MDM / [place] / ED COURSE  I reviewed the triage vital signs and the nursing notes.                              Differential diagnosis includes, but is not limited to, dysrhythmia, electrolyte abnormality, anemia, heart failure, thyrotoxicosis, thyroid storm, hypothyroidism  Patient presents to the ER with shortness of breath found to be tachycardic with EKG showing evidence of atrial flutter.  Does not have a history of a flutter or A-fib.  No history of CHF.  States he been compliant with his hypothyroid medications.  Blood sent for the  but differential get some IV fluids.  Does not have any wheezing on exam.  Concern for thyrotoxicosis.   Clinical Course as of 05/13/21 1608  Wed May 13, 2021  1319 Chest x-ray by my review shows no evidence of pneumothorax. [PR]  1408 TSH is low therefore concern for thyrotoxicosis vs storm.  No AMS.  Will order propranolol to see if that gives better rate control.  Have ordered IV fluids.  Mildly elevated BNP, is not hypoxic however.  No leukocytosis no anemia. [PR]  9924 Free T4 is elevated concerning for thyrotoxicosis. [PR]  1513 Heart rate 138 after propranolol will give additional dose.  As we do not have endocrinology at this facility will require transfer.  Patient requesting transfer  to [facility] or [facility]. [PR]    Clinical Course User Index [PR] Merlyn Lot, MD    Patient signed out to oncoming physician pending call back from [facility] as [facility] is on diversion.     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    Merlyn Lot, MD 05/13/21 [phone]

## 2021-05-13 NOTE — ED Notes (Signed)
Called [facility] for transfer, Esmond Camper stated [facility] is at capacity and could not accept medicine patients.

## 2021-05-13 NOTE — ED Notes (Signed)
Called [facility] for transfer [phone]

## 2021-05-13 NOTE — Telephone Encounter (Signed)
Can we please see if anyone can see him today- otherwise I would recommend he go to the [facility] or see me tomorrow at 11:20 if it's getting better

## 2021-05-13 NOTE — Telephone Encounter (Signed)
Pt states that he is currently at the walk in clinic by the hospital. I let him know that if he needs anything he will give [place] a call.

## 2021-05-13 NOTE — ED Notes (Signed)
Images powershared to [place]

## 2021-05-13 NOTE — ED Notes (Signed)
Patient vomited on the floor and into emesis bag approx 150 ml. C/o difficulty breathing. Denies pain. Sinus Tach. HR 137.  Ativan given per MD order. A&Ox4. Skin p/w/d. RR even and nonlabored.

## 2021-05-13 NOTE — ED Notes (Signed)
PT  ON  WAITLIST  AT  [facility]

## 2021-05-14 ENCOUNTER — Inpatient Hospital Stay (HOSPITAL_COMMUNITY)
Admit: 2021-05-14 | Discharge: 2021-05-14 | Disposition: A | Discharge status: Home / Self Care | Payer: Medicare HMO | Attending: Nurse Practitioner | Admitting: Nurse Practitioner

## 2021-05-14 ENCOUNTER — Other Ambulatory Visit: Payer: Medicare HMO | Admitting: Urology

## 2021-05-14 ENCOUNTER — Inpatient Hospital Stay: Payer: Medicare HMO

## 2021-05-14 ENCOUNTER — Encounter: Payer: Self-pay | Admitting: Pulmonary Disease

## 2021-05-14 DIAGNOSIS — E0510 Thyrotoxicosis with toxic single thyroid nodule without thyrotoxic crisis or storm: Secondary | ICD-10-CM

## 2021-05-14 DIAGNOSIS — E0591 Thyrotoxicosis, unspecified with thyrotoxic crisis or storm: Secondary | ICD-10-CM

## 2021-05-14 DIAGNOSIS — I4892 Unspecified atrial flutter: Secondary | ICD-10-CM | POA: Diagnosis not present

## 2021-05-14 DIAGNOSIS — E0520 Thyrotoxicosis with toxic multinodular goiter without thyrotoxic crisis or storm: Secondary | ICD-10-CM

## 2021-05-14 DIAGNOSIS — R112 Nausea with vomiting, unspecified: Secondary | ICD-10-CM

## 2021-05-14 DIAGNOSIS — I5031 Acute diastolic (congestive) heart failure: Secondary | ICD-10-CM | POA: Diagnosis not present

## 2021-05-14 DIAGNOSIS — J90 Pleural effusion, not elsewhere classified: Secondary | ICD-10-CM | POA: Diagnosis not present

## 2021-05-14 DIAGNOSIS — I420 Dilated cardiomyopathy: Secondary | ICD-10-CM

## 2021-05-14 LAB — RESPIRATORY PANEL BY PCR

## 2021-05-14 LAB — URINALYSIS, COMPLETE (UACMP) WITH MICROSCOPIC
Bacteria, UA: NONE SEEN
Bilirubin Urine: NEGATIVE
Glucose, UA: 50 mg/dL — AB
Hgb urine dipstick: NEGATIVE
Ketones, ur: NEGATIVE mg/dL
Nitrite: NEGATIVE
Protein, ur: 100 mg/dL — AB
Specific Gravity, Urine: 1.026 (ref 1.005–1.030)
pH: 5 (ref 5.0–8.0)

## 2021-05-14 LAB — HIV ANTIBODY (ROUTINE TESTING W REFLEX): HIV Screen 4th Generation wRfx: NONREACTIVE

## 2021-05-14 LAB — ECHOCARDIOGRAM COMPLETE
AR max vel: 2.12 cm2
AV Area VTI: 1.79 cm2
AV Area mean vel: 2.14 cm2
AV Mean grad: 2 mmHg
AV Peak grad: 2.9 mmHg
Ao pk vel: 0.85 m/s
Area-P 1/2: 7.44 cm2
Calc EF: 39.4 %
Height: 73 in
MV M vel: 5.18 m/s
MV Peak grad: 107.3 mmHg
MV VTI: 1.53 cm2
S' Lateral: 5.31 cm
Single Plane A2C EF: 40.3 %
Single Plane A4C EF: 39.9 %
Weight: 2546.75 oz

## 2021-05-14 LAB — LACTIC ACID, PLASMA
Lactic Acid, Venous: 1.7 mmol/L (ref 0.5–1.9)
Lactic Acid, Venous: 1.6 mmol/L (ref 0.5–1.9)

## 2021-05-14 LAB — BASIC METABOLIC PANEL
Anion gap: 8 (ref 5–15)
BUN: 27 mg/dL — ABNORMAL HIGH (ref 8–23)
CO2: 22 mmol/L (ref 22–32)
Calcium: 8.6 mg/dL — ABNORMAL LOW (ref 8.9–10.3)
Chloride: 103 mmol/L (ref 98–111)
Creatinine, Ser: 1.28 mg/dL — ABNORMAL HIGH (ref 0.61–1.24)
GFR, Estimated: >60 mL/min (ref >60)
Glucose, Bld: 113 mg/dL — ABNORMAL HIGH (ref 70–99)
Potassium: 4.8 mmol/L (ref 3.5–5.1)
Sodium: 133 mmol/L — ABNORMAL LOW (ref 135–145)

## 2021-05-14 LAB — CBC
HCT: 42.8 % (ref 39.0–52.0)
Hemoglobin: 14.2 g/dL (ref 13.0–17.0)
MCH: 32.1 pg (ref 26.0–34.0)
MCHC: 33.2 g/dL (ref 30.0–36.0)
MCV: 96.6 fL (ref 80.0–100.0)
Platelets: 150 10*3/uL (ref 150–400)
RBC: 4.43 MIL/uL (ref 4.22–5.81)
RDW: 12.9 % (ref 11.5–15.5)
WBC: 9.2 10*3/uL (ref 4.0–10.5)
nRBC: 0 % (ref 0.0–0.2)

## 2021-05-14 LAB — D-DIMER, QUANTITATIVE: D-Dimer, Quant: 1.37 ug/mL-FEU — ABNORMAL HIGH (ref 0.00–0.50)

## 2021-05-14 LAB — GLUCOSE, CAPILLARY: Glucose-Capillary: 117 mg/dL — ABNORMAL HIGH (ref 70–99)

## 2021-05-14 LAB — MAGNESIUM
Magnesium: 1.8 mg/dL (ref 1.7–2.4)
Magnesium: 2.4 mg/dL (ref 1.7–2.4)

## 2021-05-14 LAB — PHOSPHORUS: Phosphorus: 4.6 mg/dL (ref 2.5–4.6)

## 2021-05-14 LAB — BLOOD GAS, ARTERIAL

## 2021-05-14 LAB — PROTIME-INR
INR: 1.3 — ABNORMAL HIGH (ref 0.8–1.2)
Prothrombin Time: 15.8 seconds — ABNORMAL HIGH (ref 11.4–15.2)

## 2021-05-14 LAB — APTT: aPTT: 30 seconds (ref 24–36)

## 2021-05-14 LAB — PROCALCITONIN
Procalcitonin: <0.1 ng/mL
Procalcitonin: <0.1 ng/mL

## 2021-05-14 LAB — STREP PNEUMONIAE URINARY ANTIGEN: Strep Pneumo Urinary Antigen: NEGATIVE

## 2021-05-14 LAB — T3, FREE: T3, Free: 8.1 pg/mL — ABNORMAL HIGH (ref 2.0–4.4)

## 2021-05-14 LAB — MRSA NEXT GEN BY PCR, NASAL: MRSA by PCR Next Gen: NOT DETECTED

## 2021-05-14 LAB — CK: Total CK: 182 U/L (ref 49–397)

## 2021-05-14 LAB — CORTISOL: Cortisol, Plasma: 31.4 ug/dL

## 2021-05-14 MED ORDER — SODIUM CHLORIDE 0.9 % IV SOLN: 2.0000 g | INTRAVENOUS | Status: DC

## 2021-05-14 MED ORDER — PROPRANOLOL HCL 20 MG PO TABS: 20.0000 mg | ORAL_TABLET | Freq: Four times a day (QID) | ORAL | Status: DC

## 2021-05-14 MED ORDER — ENSURE ENLIVE PO LIQD
237.0000 mL | Freq: Three times a day (TID) | ORAL | Status: DC
  Administered 2021-05-14 – 2021-05-15 (×3): 237 mL via ORAL

## 2021-05-14 MED ORDER — FUROSEMIDE 10 MG/ML IJ SOLN
INTRAMUSCULAR | Status: AC
  Administered 2021-05-14: 20 mg via INTRAVENOUS
  Filled 2021-05-14: qty 2

## 2021-05-14 MED ORDER — FUROSEMIDE 10 MG/ML IJ SOLN: 20.0000 mg | Freq: Once | INTRAMUSCULAR | Status: AC

## 2021-05-14 MED ORDER — SODIUM CHLORIDE 0.9 % IV SOLN
12.5000 mg | Freq: Four times a day (QID) | INTRAVENOUS | Status: DC | PRN
  Administered 2021-05-15: 12.5 mg via INTRAVENOUS
  Filled 2021-05-14: qty 0.5
  Filled 2021-05-14: qty 12.5

## 2021-05-14 MED ORDER — HEPARIN BOLUS VIA INFUSION
4300.0000 [IU] | Freq: Once | INTRAVENOUS | Status: AC
  Administered 2021-05-14: 4300 [IU] via INTRAVENOUS
  Filled 2021-05-14: qty 4300

## 2021-05-14 MED ORDER — ONDANSETRON HCL 4 MG/2ML IJ SOLN
4.0000 mg | Freq: Four times a day (QID) | INTRAMUSCULAR | Status: DC
  Administered 2021-05-14 – 2021-05-15 (×7): 4 mg via INTRAVENOUS
  Filled 2021-05-14 (×6): qty 2

## 2021-05-14 MED ORDER — POLYETHYLENE GLYCOL 3350 17 G PO PACK: 17.0000 g | PACK | Freq: Every day | ORAL | 0 refills | Status: DC | PRN

## 2021-05-14 MED ORDER — HEPARIN (PORCINE) 25000 UT/250ML-% IV SOLN: 850.0000 [IU]/h | INTRAVENOUS | Status: DC

## 2021-05-14 MED ORDER — ADULT MULTIVITAMIN W/MINERALS CH
1.0000 | ORAL_TABLET | Freq: Every day | ORAL | Status: DC
  Administered 2021-05-15: 1 via ORAL
  Filled 2021-05-14: qty 1

## 2021-05-14 MED ORDER — METHYLPREDNISOLONE SODIUM SUCC 40 MG IJ SOLR
40.0000 mg | Freq: Two times a day (BID) | INTRAMUSCULAR | Status: DC
  Administered 2021-05-14 – 2021-05-15 (×4): 40 mg via INTRAVENOUS
  Filled 2021-05-14 (×4): qty 1

## 2021-05-14 MED ORDER — SODIUM CHLORIDE 0.9 % IV SOLN
2.0000 g | INTRAVENOUS | Status: DC
  Administered 2021-05-14 – 2021-05-15 (×2): 2 g via INTRAVENOUS
  Filled 2021-05-14 (×2): qty 2

## 2021-05-14 MED ORDER — DOXYCYCLINE HYCLATE 100 MG PO TABS: 100.0000 mg | ORAL_TABLET | Freq: Two times a day (BID) | ORAL | Status: DC

## 2021-05-14 MED ORDER — ACETAMINOPHEN 325 MG PO TABS: 650.0000 mg | ORAL_TABLET | Freq: Four times a day (QID) | ORAL | Status: DC | PRN

## 2021-05-14 MED ORDER — MAGNESIUM SULFATE 2 GM/50ML IV SOLN
2.0000 g | Freq: Once | INTRAVENOUS | Status: AC
  Administered 2021-05-14: 2 g via INTRAVENOUS

## 2021-05-14 MED ORDER — PROPYLTHIOURACIL 50 MG PO TABS: 200.0000 mg | ORAL_TABLET | ORAL | Status: DC

## 2021-05-14 MED ORDER — HEPARIN (PORCINE) 25000 UT/250ML-% IV SOLN
850.0000 [IU]/h | INTRAVENOUS | Status: DC
  Administered 2021-05-14 – 2021-05-15 (×2): 850 [IU]/h via INTRAVENOUS
  Filled 2021-05-14 (×2): qty 250

## 2021-05-14 MED ORDER — ADULT MULTIVITAMIN W/MINERALS CH: 1.0000 | ORAL_TABLET | Freq: Every day | ORAL | Status: DC

## 2021-05-14 MED ORDER — SODIUM CHLORIDE 0.9 % IV SOLN: 12.5000 mg | Freq: Four times a day (QID) | INTRAVENOUS | Status: DC | PRN

## 2021-05-14 MED ORDER — ONDANSETRON HCL 4 MG/2ML IJ SOLN
4.0000 mg | Freq: Once | INTRAMUSCULAR | Status: DC
  Filled 2021-05-14: qty 2

## 2021-05-14 MED ORDER — ACETAMINOPHEN 325 MG PO TABS
650.0000 mg | ORAL_TABLET | Freq: Four times a day (QID) | ORAL | Status: DC | PRN
  Administered 2021-05-14: 650 mg via ORAL
  Filled 2021-05-14: qty 2

## 2021-05-14 MED ORDER — DOXYCYCLINE HYCLATE 100 MG PO TABS
100.0000 mg | ORAL_TABLET | Freq: Two times a day (BID) | ORAL | Status: DC
  Administered 2021-05-14 – 2021-05-15 (×3): 100 mg via ORAL
  Filled 2021-05-14 (×3): qty 1

## 2021-05-14 MED ORDER — PROPRANOLOL HCL 20 MG PO TABS
20.0000 mg | ORAL_TABLET | Freq: Four times a day (QID) | ORAL | Status: DC
  Administered 2021-05-14 – 2021-05-15 (×9): 20 mg via ORAL
  Filled 2021-05-14 (×9): qty 1

## 2021-05-14 MED ORDER — CHLORHEXIDINE GLUCONATE CLOTH 2 % EX PADS
6.0000 | MEDICATED_PAD | Freq: Every day | CUTANEOUS | Status: DC
  Administered 2021-05-14 – 2021-05-15 (×2): 6 via TOPICAL

## 2021-05-14 MED ORDER — ENSURE ENLIVE PO LIQD: 237.0000 mL | Freq: Three times a day (TID) | ORAL | 12 refills | Status: DC

## 2021-05-14 MED ORDER — ONDANSETRON HCL 4 MG/2ML IJ SOLN: 4.0000 mg | Freq: Four times a day (QID) | INTRAMUSCULAR | 0 refills | Status: DC

## 2021-05-14 MED ORDER — DOCUSATE SODIUM 100 MG PO CAPS: 100.0000 mg | ORAL_CAPSULE | Freq: Two times a day (BID) | ORAL | 0 refills | Status: DC | PRN

## 2021-05-14 MED ORDER — IOHEXOL 350 MG/ML SOLN
75.0000 mL | Freq: Once | INTRAVENOUS | Status: AC | PRN
  Administered 2021-05-14: 75 mL via INTRAVENOUS

## 2021-05-14 MED ORDER — CHLORHEXIDINE GLUCONATE CLOTH 2 % EX PADS: 6.0000 | MEDICATED_PAD | Freq: Every day | CUTANEOUS | Status: DC

## 2021-05-14 MED ORDER — METHYLPREDNISOLONE SODIUM SUCC 40 MG IJ SOLR: 40.0000 mg | Freq: Two times a day (BID) | INTRAMUSCULAR | 0 refills | Status: DC

## 2021-05-14 MED ORDER — ONDANSETRON HCL 4 MG/2ML IJ SOLN
INTRAMUSCULAR | Status: AC
  Administered 2021-05-14: 4 mg
  Filled 2021-05-14: qty 2

## 2021-05-14 MED ORDER — ENOXAPARIN SODIUM 40 MG/0.4ML IJ SOSY: 40.0000 mg | PREFILLED_SYRINGE | INTRAMUSCULAR | Status: DC

## 2021-05-14 MED ORDER — ONDANSETRON HCL 4 MG/2ML IJ SOLN: 4.0000 mg | Freq: Once | INTRAMUSCULAR | 0 refills | Status: AC

## 2021-05-14 MED ORDER — FUROSEMIDE 10 MG/ML IJ SOLN
20.0000 mg | Freq: Once | INTRAMUSCULAR | Status: AC
  Administered 2021-05-14: 20 mg via INTRAVENOUS
  Filled 2021-05-14: qty 2

## 2021-05-14 MED ORDER — CHOLESTYRAMINE 4 G PO PACK: 4.0000 g | PACK | Freq: Three times a day (TID) | ORAL | 12 refills | Status: DC

## 2021-05-14 MED ORDER — SODIUM CHLORIDE 0.9 % IV SOLN
100.0000 mg | Freq: Two times a day (BID) | INTRAVENOUS | Status: DC
  Administered 2021-05-14: 100 mg via INTRAVENOUS
  Filled 2021-05-14 (×3): qty 100

## 2021-05-14 NOTE — Progress Notes (Signed)
Initial Nutrition Assessment  DOCUMENTATION CODES:   Non-severe (moderate) malnutrition in context of chronic illness  INTERVENTION:   Ensure Enlive po TID, each supplement provides 350 kcal and 20 grams of protein.  MVI po daily   Pt at high refeed risk; recommend monitor potassium, magnesium and phosphorus labs daily until stable  NUTRITION DIAGNOSIS:   Moderate Malnutrition related to chronic illness as evidenced by moderate fat depletion, moderate muscle depletion.  GOAL:   Patient will meet greater than or equal to 90% of their needs  MONITOR:   PO intake, Supplement acceptance, Labs, Weight trends, Skin, I & O's  REASON FOR ASSESSMENT:   Malnutrition Screening Tool    ASSESSMENT:   66 y/o male with h/o BPH, HTN, HLD, renal mass, adrenal mass, hyperthyroidism, CHF and substance use who is admitted with PNA and thyroid storm.  Met with pt in room today. Pt reports fair appetite and oral intake at baseline but reports that he has not been eating well for several days. Pt reports that he ate oatmeal and drank some juice for breakfast. Pt reports that he does not drink supplements at home but he is willing to drink Ensure here. RD will add supplements and MVI to help pt meet his estimated needs. Pt is at high refeed risk. Per chart, pt is documented to be up ~21lbs from his UBW; RD unsure if bed weights are correct. Plan is for transfer to [facility] once bed becomes available.   Medications reviewed and include: cholestyramine, lovenox, solu-medrol, zofran, ceftriaxone, doxycycline  Labs reviewed: Na 133(L), K 4.8 wnl, BUN 27(H), creat 1.28(H), P 4.6 wnl, Mg 2.4 wnl  NUTRITION - FOCUSED PHYSICAL EXAM:  Flowsheet Row Most Recent Value  Orbital Region Mild depletion  Upper Arm Region Severe depletion  Thoracic and Lumbar Region Moderate depletion  Buccal Region Mild depletion  Temple Region Mild depletion  Clavicle Bone Region Severe depletion  Clavicle and Acromion Bone  Region Severe depletion  Scapular Bone Region Moderate depletion  Dorsal Hand Moderate depletion  Patellar Region Moderate depletion  Anterior Thigh Region Moderate depletion  Posterior Calf Region Moderate depletion  Edema (RD Assessment) None  Hair Reviewed  Eyes Reviewed  Mouth Reviewed  Skin Reviewed  Nails Reviewed   Diet Order:   Diet Order             Diet regular Room service appropriate? Yes; Fluid consistency: Thin  Diet effective now                  EDUCATION NEEDS:   Education needs have been addressed  Skin:  Skin Assessment: Reviewed RN Assessment  Last BM:  pta  Height:   Ht Readings from Last 1 Encounters:  05/14/21 _0  (1.854 m)    Weight:   Wt Readings from Last 1 Encounters:  05/14/21 72.2 kg    Ideal Body Weight:  83.6 kg  BMI:  Body mass index is 21 kg/m.  Estimated Nutritional Needs:   Kcal:  2100-2400kcal/day  Protein:  105-120g/day  Fluid:  1.8-2.1L/day  Koleen Distance MS, RD, LDN Please refer to [facility] for RD and/or RD on-call/weekend/after hours pager

## 2021-05-14 NOTE — Progress Notes (Signed)
Shift Summary: Pt admitted with Thyroid crisis. Aflutter 130s on arrival. Originally on 2L [place], required 15L [place] at shift change. Patient did not tolerate laying flat in CTA well, sats dropped to 78% on 10L. Temp sensing foley inserted for UOP and temp monitoring. Patient had 2 episodes of nausea this AM, Zofran administered. Rt lung has coarse rales throughout. Pt pending transfer to [facility] for further mgmt. Report given to [place], RN and patient care transferred.

## 2021-05-14 NOTE — Consult Note (Signed)
PHARMACY CONSULT NOTE  Pharmacy Consult for Electrolyte Monitoring and Replacement   Recent Labs: Potassium (mmol/L)  Date Value  05/14/2021 4.8   Magnesium (mg/dL)  Date Value  05/14/2021 2.4   Calcium (mg/dL)  Date Value  05/14/2021 8.6 (L)   Albumin (g/dL)  Date Value  05/13/2021 3.6  03/19/2021 3.6 (L)   Phosphorus (mg/dL)  Date Value  05/14/2021 4.6   Sodium (mmol/L)  Date Value  05/14/2021 133 (L)  03/19/2021 136   Assessment: Patient is a 66 y/o M with medical history including multinodular goiter / hyperthyroidism, BPH, tobacco abuse who is admitted with thyroid storm and respiratory failure in setting of pulmonary edema and multifocal pneumonia. Pharmacy consulted to assist with electrolyte monitoring and replacement as indicated.  Goal of Therapy:  K 4, Mg 2 and all other electrolytes within normal limits  Plan:  --No electrolyte replacement indicated at this time --Follow-up electrolytes with AM labs tomorrow  Benita Gutter 05/14/2021 8:04 AM

## 2021-05-14 NOTE — Progress Notes (Signed)
*  PRELIMINARY RESULTS* Echocardiogram 2D Echocardiogram has been performed.  Tyler Cohen 05/14/2021, 9:30 AM

## 2021-05-14 NOTE — Discharge Summary (Incomplete)
Physician Discharge Summary  Patient ID: Tyler Cohen MRN: [id number] DOB/AGE: 66-Feb-1957 66 y.o.  Admit date: 05/13/2021 Discharge date: 05/14/2021   Brief Pt Description / Synopsis:  66 y.o. Male admitted with Thyroid Storm secondary to Toxic Multinodular Goiter and noncompliance to medications (hasn't taken meds in approximately 1 month), along with Acute Hypoxic Respiratory Failure in the setting of Acute Decompensated CHF and Multifocal Pneumonia.  Requires transfer to [facility] as Endocrinology services are not available inpatient at [facility].  Discharge Diagnoses:   Thyroid Storm Acute Hypoxic Respiratory Failure  Multifocal Pneumonia Acute Decompensated CHF Atrial Flutter Transaminitis                                                            Discharge Summary:   66 y.o male with significant PMH of hypertension, Hyperlipidemia, renal mass, adrenal mass, former smoker, hyperthyroidism treated with PTU and methimazole, and multinodular goiter (US Thyroid 2019) who presented to the ED with chief complaints of progressive shortness of breath since yesterday.   Patient state he was seen at the urgent care for reports of acute dyspnea associated with dry cough, chest tightness, palpitations, nausea and vomiting. Patient was found to be tachycardic to 156 in the clinic with O2 sat 100% on room air and afebrile. He was sent to the ER for further evaluation and management given symptomatology, tachycardia.   ED Course: In the emergency department, the temperature was 37.1C, the heart rate 150 beats/minute, the blood pressure 112/85 mm Hg, the respiratory rate 28 breaths/minute, and the oxygen saturation 98% on 2L. Labs revealed unremarkable CBC, slightly elevated AST/ALT: 65/119 otherwise unremarkable CMP. Thyroid function tests ordered on admission revealed a thyroid-stimulating hormone (TSH) level of <0.01 ?U/mL and a free thyroxine level of 3.49ng/dL, confirming the diagnosis of thyrotoxicosis.  The patient was started on 1 gram of propylthiouracil, 20 mg of propranolol, 100 mg of hydrocortisone, 4 grams of cholestyramine, 20 mg IV Lasix as well as empiric antibiotics for possible right basilar pneumonia noted on chest x-ray. Patient remains tachycardic, borderline hypoxic after receiving medications.  He has been accepted to [facility] but no bed will be available for at least the next 12 hours.  Given patient's critical illness, PCCM team consulted for admission pending transfer.  Discharge Plan by Diagnosis:   Thyroid Storm likely secondary to Toxic Multinodular Goiter  Burch-Wartofsky score: 65 on admission PMHx: Multinodular goiter, Adrenal mass, Hyperthyroidism noncompliant with meds -Continue propylthiouracil 200 mg q4 will transition to methimazole once patient is improving clinically -Potassium Iodide 5 drops (0.25 mL or 268m) orally every 6 hours -Propranolol 20 mg po q6hr. Monitor for worsening cardiogenic pulmonary edema/shock -Continue Stress dose 50 mg IV Q6hr following loading dose -Start Cholestyramine 4 grams orally q6hrs. -Blood cultures & infectious workup as indicated. -Monitor I&O's / urinary output -Follow BMP+ mag. Replace electrolytes as indicated -Avoid aspirin or NSAIDS  -Obtain [place]Thyroid -Endocrinology not available inpatient, [facility] Dr. BIllene Labrador    Acute Hypoxic Respiratory Failure secondary to Pulmonary Edema and Multifocal Pneumonia -Supplemental O2 as needed to maintain O2 saturations > 92% -High risk for intubation -Follow intermittent ABG and chest x-ray as needed -Obtain CTA Chest -F/u blood and cultures, trend Lactic/ PCT -Monitor WBC/ fever curve -Start Empiric antibiotics  with Ceftriaxone and Doxy -Gentle IVF hydration as needed -Pressors for MAP goal >65 -Encourage smoking cessation   Acute Decompensated CHF likely high output in the setting of Thyroid storm Atrial Flutter/SVT Hx: Hypertension,  HLD  -Continuous cardiac monitoring -Serial EKG -Maintain MAP greater than 65 -Rate control with Propranolol as above -PRN Lasix as BP and renal function permits -Obtain 2D Echocardiogram -Cardiology consulted, appreciate input ~ plan to start Heparin gtt   Elevated LFTs likely in the setting of above Mild elevation in AST, ALT, LDH, bilirubin, ALP -Trend CMP -Avoid Hepatotoxins -Transition PTU to Methimazole if worsening Liver enzymes    Significant Events:   2/9: Admitted to ICU with Thyroid Storm pending transfer to [facility].  IR consulted to evaluate for Thoracentesis of right pleural effusion. Cardiology consulted.   Significant Diagnostic Studies:  2/8: Chest Xray>New mild-to-moderate interstitial thickening may represent mild to moderate interstitial pulmonary edema. Progressive chronic scarring can have a similar appearance 2/9: CTA Chest>No pulmonary embolism. Mild global cardiomegaly. Morphologic changes in keeping with pulmonary arterial hypertension. Marked reflux of contrast into the hepatic venous system in keeping with at least some degree of right heart failure. Multifocal pulmonary infiltrate throughout the right lung and associated asymmetric bronchial wall thickening in keeping with multifocal bronchopneumonia. Associated small right pleural effusion.Superimposed asymmetric pulmonary edema throughout the right lung. Emphysema (ICD10-J43.9). 2/9: Echocardiogram>> 1. The mitral valve is normal in structure. Small mobile opacity noted on anterior and posterior mitral valve leaflets, unable to exclude valve vegetations vs other etiology/valve cord. Clinical correlation recommended. Moderate mitral valve  regurgitation. No evidence of mitral stenosis.   2. Left ventricular ejection fraction, by estimation, is 25 to 30%. The  left ventricle has severely decreased function. The left ventricle  demonstrates global hypokinesis. The left ventricular internal cavity size  was  moderately dilated. Left  ventricular diastolic parameters are indeterminate.   3. Right ventricular systolic function is moderately reduced. The right  ventricular size is mildly enlarged. There is mildly elevated pulmonary  artery systolic pressure.   4. The aortic valve is normal in structure. Aortic valve regurgitation is  not visualized. No aortic stenosis is present.   5. There is borderline dilatation of the aortic root, measuring 38 mm.   6. The inferior vena cava is normal in size with greater than 50%  respiratory variability, suggesting right atrial pressure of 3 mmHg.  2/9: Thyroid US>>            Micro Data:  2/8: SARS-CoV-2 PCR> negative 2/8: Influenza PCR> negative 2/9: Blood culture x2> 2/9: Urine Culture> 2/9: MRSA PCR>>  2/9: Strep pneumo urinary antigen> 2/9: Legionella urinary antigen> 2/9: Respiratory Viral Panel>   Antimicrobials:  Doxy 2/8> Ceftriaxone 2/8>   Consults:  PCCM  Cardiology  Discharge Exam:   GENERAL: year-old critically ill patient lying in the bed lethargic.  EYES: Pupils equal, round, reactive to light and accommodation. No scleral icterus. Extraocular muscles intact.  HEENT: Head atraumatic, normocephalic. Oropharynx and nasopharynx clear.  NECK:  Supple, no jugular venous distention. Mild palpable thyroid enlargement, no tenderness.  LUNGS: Decreased breath sounds bilaterally, mild crackles , wheezing, rales, and rhonchi. Mild use of accessory muscles of respiration.  CARDIOVASCULAR: S1, S2 normal. Systolic murmurs present, rubs, or gallops.  ABDOMEN: Soft, nontender, nondistended. Bowel sounds present. No organomegaly or mass.  EXTREMITIES: No pedal edema, cyanosis, or clubbing.  NEUROLOGIC: Cranial nerves II through XII are intact.  Muscle strength 5/5 in all extremities. Sensation intact. Gait not checked.  PSYCHIATRIC: The patient is alert and oriented x 3.  SKIN: No obvious rash, lesion, or ulcer.   Vitals:   05/14/21 1600  05/14/21 1630 05/14/21 1700 05/14/21 1800  BP: 101/90 (!) 103/91 111/86 101/80  Pulse: (!) 128 (!) 127 (!) 128   Resp: 14 16 (!) 22 (!) 24  Temp: 99.9 F (37.7 C) 99.3 F (37.4 C) 100 F (37.8 C) 100.2 F (37.9 C)  TempSrc:      SpO2: 100% 100% 100%   Weight:      Height:         Discharge Labs:   BMET Recent Labs  Lab 05/13/21 1211 05/14/21 0112 05/14/21 0456  NA 135  --  133*  K 4.2  --  4.8  CL 107  --  103  CO2 22  --  22  GLUCOSE 130*  --  113*  BUN 20  --  27*  CREATININE 1.13  --  1.28*  CALCIUM 8.9  --  8.6*  MG  --  1.8 2.4  PHOS  --   --  4.6    CBC Recent Labs  Lab 05/13/21 1211 05/14/21 0456  HGB 13.6 14.2  HCT 40.8 42.8  WBC 7.7 9.2  PLT 164 150    Anti-Coagulation Recent Labs  Lab 05/14/21 0112  INR 1.3*          Allergies as of 05/14/2021   No Known Allergies      Medication List     STOP taking these medications    albuterol 108 (90 Base) MCG/ACT inhaler Commonly known as: VENTOLIN HFA   aspirin 81 MG tablet   atorvastatin 20 MG tablet Commonly known as: LIPITOR   b complex vitamins tablet   clobetasol ointment 0.05 % Commonly known as: TEMOVATE   clotrimazole-betamethasone cream Commonly known as: Lotrisone   lidocaine 5 % ointment Commonly known as: XYLOCAINE   methimazole 10 MG tablet Commonly known as: TAPAZOLE   metoCLOPramide 10 MG tablet Commonly known as: REGLAN   tamsulosin 0.4 MG Caps capsule Commonly known as: FLOMAX       TAKE these medications    acetaminophen 325 MG tablet Commonly known as: TYLENOL Take 2 tablets (650 mg total) by mouth every 6 (six) hours as needed for mild pain, fever or headache.   cefTRIAXone 2 g in sodium chloride 0.9 % 100 mL Inject 2 g into the vein daily.   Chlorhexidine Gluconate Cloth 2 % Pads Apply 6 each topically daily at 6 (six) AM. Start taking on: May 15, 2021   cholestyramine 4 g packet Commonly known as: QUESTRAN Take 1 packet (4 g  total) by mouth 3 (three) times daily.   docusate sodium 100 MG capsule Commonly known as: COLACE Take 1 capsule (100 mg total) by mouth 2 (two) times daily as needed for mild constipation.   doxycycline 100 MG tablet Commonly known as: VIBRA-TABS Take 1 tablet (100 mg total) by mouth every 12 (twelve) hours.   enoxaparin 40 MG/0.4ML injection Commonly known as: LOVENOX Inject 0.4 mLs (40 mg total) into the skin daily. Start taking on: May 15, 2021   feeding supplement Liqd Take 237 mLs by mouth 3 (three) times daily between meals.   heparin 25000 UT/250ML infusion Inject 850 Units/hr into the vein continuous.   methylPREDNISolone sodium succinate 40 mg/mL injection Commonly known as: SOLU-MEDROL Inject 1 mL (40 mg total) into the vein 2 (two) times daily.   multivitamin with minerals Tabs tablet Take  1 tablet by mouth daily. Start taking on: May 15, 2021   ondansetron 4 MG/2ML Soln injection Commonly known as: ZOFRAN Inject 2 mLs (4 mg total) into the vein once for 1 dose.   ondansetron 4 MG/2ML Soln injection Commonly known as: ZOFRAN Inject 2 mLs (4 mg total) into the vein every 6 (six) hours.   polyethylene glycol 17 g packet Commonly known as: MIRALAX / GLYCOLAX Take 17 g by mouth daily as needed for moderate constipation.   propranolol 20 MG tablet Commonly known as: INDERAL Take 1 tablet (20 mg total) by mouth every 6 (six) hours.   propylthiouracil 50 MG tablet Commonly known as: PTU Take 4 tablets (200 mg total) by mouth every 4 (four) hours. What changed:  how much to take when to take this   sodium chloride 0.9 % SOLN 50 mL with promethazine 25 MG/ML SOLN 12.5 mg Inject 12.5 mg into the vein every 6 (six) hours as needed.            Disposition: ICU  Discharged Condition: Tyler Cohen has met maximum benefit of inpatient care at [facility] and requires transfer to [facility] for higher level of care.      Time spent on disposition:  50  Minutes.     Signed: Darel Hong, AGACNP-BC [facility] Prefer epic messenger for cross cover needs If after hours, please call E-link

## 2021-05-14 NOTE — Progress Notes (Signed)
ANTICOAGULATION CONSULT NOTE  Pharmacy Consult for heparin Indication: atrial fibrillation  No Known Allergies  Patient Measurements: Height: 6\' 1"  (185.4 cm) Weight: 72.2 kg (159 lb 2.8 oz) IBW/kg (Calculated) : 79.9 Heparin Dosing Weight: 72.2 kg  Vital Signs: Temp: 100 F (37.8 C) (02/09 1700) Temp Source: Bladder (02/09 0800) BP: 111/86 (02/09 1700) Pulse Rate: 128 (02/09 1700)  Labs: Recent Labs    05/13/21 1211 05/13/21 1424 05/14/21 0112 05/14/21 0456  HGB 13.6  --   --  14.2  HCT 40.8  --   --  42.8  PLT 164  --   --  150  APTT  --   --  30  --   LABPROT  --   --  15.8*  --   INR  --   --  1.3*  --   CREATININE 1.13  --   --  1.28*  CKTOTAL  --   --  182  --   TROPONINIHS 18* 16  --   --     Estimated Creatinine Clearance: 58.8 mL/min (A) (by C-G formula based on SCr of 1.28 mg/dL (H)).   Medical History: Past Medical History:  Diagnosis Date   Arthritis    Heart murmur    Hyperlipidemia    Macrocytosis    Thyroid disease    Tobacco abuse       Assessment: Patient is a 66 y/o M with medical history including multinodular goiter / hyperthyroidism, BPH, tobacco abuse who is admitted with thyroid storm and respiratory failure in setting of pulmonary edema and multifocal pneumonia. Patient with atrial fibrillation. Pharmacy consulted for management of heparin.  Goal of Therapy:  Heparin level 0.3-0.7 units/ml Monitor platelets by anticoagulation protocol: Yes   Plan:  --Heparin 4300 unit bolus --Heparin infusion at 850 units/hr --Check HL 2/10 at 0100 --CBC daily while on heparin infusion  Tawnya Crook, PharmD, BCPS Clinical Pharmacist 05/14/2021 6:10 PM

## 2021-05-14 NOTE — Consult Note (Signed)
Cardiology Consultation:   Patient ID: Tyler Cohen; [id number]; 11-May-1955   Admit date: 05/13/2021 Date of Consult: 05/14/2021  Primary Care Provider: Valerie Roys, DO Primary Cardiologist: New to [facility] - consult by Rockey Situ (previously evaluated by Dr. Rockey Situ in 08/2014) Primary Electrophysiologist:  None   Patient Profile:   Tyler Cohen is a 66 y.o. male with a hx of hyperthyroidism treated with PTU and methimazole, multinodular goiter, renal mass, adrenal mass, HTN, HLD and prior tobacco use who is being seen today for the evaluation of atrial flutter with RVR in the context of multifocal bronchopneumonia and thyroid storm at the request of Dr. Lanney Gins.  History of Present Illness:   Tyler Cohen was previously evaluated by Dr. Rockey Situ in 08/2014 for chest pain.  ETT at that time showed no ischemic changes.  He was admitted to [facility] on 05/14/2021 with multifocal bronchopneumonia and thyroid storm felt to possibly be related to toxic multinodular goiter.  BP stable.  He was tachycardic with rates in the 140s to 160s bpm.  Tmax 100.6 F.  O2 saturations 98% on 2 L via nasal cannula.  Labs upon admission show a TSH of less than 0.01 with a free T4 of 3.49.  High-sensitivity troponin 18 with a delta troponin of 16.  BNP 988.  PCT less than 0.1.  D-dimer 1.37.  CBC was unremarkable.  AST/ALT 65/119 respectively.  EKG showed atrial flutter with 2-1 AV block, 151 bpm, nonspecific ST-T changes.  Chest x-ray showed progression of right lower lobe airspace disease favoring pneumonia.  CTA chest was negative for PE with changes concerning for possible pulmonary hypertension.  Multifocal pulmonary infiltrate throughout the right lung was again noted consistent with multifocal bronchopneumonia as well as an associated right small pleural effusion.  He has been managed by the critical care service.  Cardiology is consulted for ongoing management of atrial flutter with RVR.  Currently on propanolol 20  mg q 6 hours.  Echo, performed in atrial flutter with RVR, demonstrated an EF of 25 to 30%, global hypokinesis, moderately dilated LV internal cavity size, moderate mitral valve regurgitation with a small mobile opacity noted on the anterior and posterior mitral valve leaflets unable to exclude valvular vegetation or alternative etiologies such as a valve cord, moderately reduced RV systolic function with mildly enlarged RV cavity size,.  He remains in atrial flutter with RVR with ventricular rates around 130 bpm.  He continues to note right-sided chest discomfort that is improved when leaning forward and pulling on the end of his bed as well as with the application of a cool washcloth.  He does not feel tachypalpitations.  He notes his appetite has been diminished over the past week.  He continues to note orthopnea.  No dizziness, presyncope, or syncope.  He received 20 mg of IV Lasix in the ED last evening and again this morning.  He has had 2 episodes of nonbloody emesis.  He is awaiting bed transfer at [facility].      Past Medical History:  Diagnosis Date   Arthritis    Heart murmur    Hyperlipidemia    Macrocytosis    Thyroid disease    Tobacco abuse     Past Surgical History:  Procedure Laterality Date   COLONOSCOPY WITH PROPOFOL N/A 12/01/2015   Procedure: COLONOSCOPY WITH PROPOFOL;  Surgeon: Lucilla Lame, MD;  Location: [place];  Service: Endoscopy;  Laterality: N/A;   POLYPECTOMY  12/01/2015   Procedure: POLYPECTOMY;  Surgeon: Lucilla Lame, MD;  Location: [place];  Service: Endoscopy;;     Home Meds: Prior to Admission medications   Medication Sig Start Date End Date Taking? Authorizing Provider  acetaminophen (TYLENOL) 325 MG tablet Take 2 tablets (650 mg total) by mouth every 6 (six) hours as needed for mild pain, fever or headache. 05/14/21   Bradly Bienenstock, NP  albuterol (VENTOLIN HFA) 108 (90 Base) MCG/ACT inhaler Inhale 2 puffs into the lungs every 6 (six)  hours as needed for wheezing or shortness of breath. 03/19/21   Park Liter P, DO  aspirin 81 MG tablet Take 81 mg by mouth daily.    [provider]  atorvastatin (LIPITOR) 20 MG tablet TAKE 1 TABLET(20 MG) BY MOUTH AT BEDTIME 03/19/21   Johnson, Megan P, DO  b complex vitamins tablet Take 1 tablet by mouth daily.    [provider]  cefTRIAXone 2 g in sodium chloride 0.9 % 100 mL Inject 2 g into the vein daily. 05/14/21   Bradly Bienenstock, NP  Chlorhexidine Gluconate Cloth 2 % PADS Apply 6 each topically daily at 6 (six) AM. 05/15/21   Bradly Bienenstock, NP  cholestyramine Lucrezia Starch) 4 g packet Take 1 packet (4 g total) by mouth 3 (three) times daily. 05/14/21   Bradly Bienenstock, NP  clobetasol ointment (TEMOVATE) 2.35 % Apply 1 application topically 2 (two) times daily. 04/09/20   Johnson, Megan P, DO  clotrimazole-betamethasone (LOTRISONE) cream Apply 1 application topically 2 (two) times daily. 04/09/20   Johnson, Megan P, DO  docusate sodium (COLACE) 100 MG capsule Take 1 capsule (100 mg total) by mouth 2 (two) times daily as needed for mild constipation. 05/14/21   Bradly Bienenstock, NP  doxycycline (VIBRA-TABS) 100 MG tablet Take 1 tablet (100 mg total) by mouth every 12 (twelve) hours. 05/14/21   Bradly Bienenstock, NP  enoxaparin (LOVENOX) 40 MG/0.4ML injection Inject 0.4 mLs (40 mg total) into the skin daily. 05/15/21   Bradly Bienenstock, NP  lidocaine (XYLOCAINE) 5 % ointment Apply 1 application topically as needed. 04/09/20   Johnson, Megan P, DO  methimazole (TAPAZOLE) 10 MG tablet TAKE 2 TABLETS(20 MG) BY MOUTH TWICE DAILY 03/19/21   [name], Megan P, DO  methylPREDNISolone sodium succinate (SOLU-MEDROL) 40 mg/mL injection Inject 1 mL (40 mg total) into the vein 2 (two) times daily. 05/14/21   Bradly Bienenstock, NP  metoCLOPramide (REGLAN) 10 MG tablet Take by mouth. 03/13/21 03/23/21  [provider]  ondansetron (ZOFRAN) 4 MG/2ML SOLN injection Inject 2 mLs (4 mg total)  into the vein once for 1 dose. 05/14/21 05/14/21  Bradly Bienenstock, NP  ondansetron (ZOFRAN) 4 MG/2ML SOLN injection Inject 2 mLs (4 mg total) into the vein every 6 (six) hours. 05/14/21   Bradly Bienenstock, NP  polyethylene glycol (MIRALAX / GLYCOLAX) 17 g packet Take 17 g by mouth daily as needed for moderate constipation. 05/14/21   Bradly Bienenstock, NP  propranolol (INDERAL) 20 MG tablet Take 1 tablet (20 mg total) by mouth every 6 (six) hours. 05/14/21   Bradly Bienenstock, NP  propylthiouracil (PTU) 50 MG tablet Take 2 tablets (100 mg total) by mouth 3 (three) times daily. 03/19/21   Johnson, Megan P, DO  propylthiouracil (PTU) 50 MG tablet Take 4 tablets (200 mg total) by mouth every 4 (four) hours. 05/14/21   Darel Hong D, NP  tamsulosin (FLOMAX) 0.4 MG CAPS capsule TAKE 1 CAPSULE(0.4 MG) BY  MOUTH DAILY 03/19/21   Valerie Roys, DO    Inpatient Medications: Scheduled Meds:  Chlorhexidine Gluconate Cloth  6 each Topical Q0600   cholestyramine  4 g Oral TID   doxycycline  100 mg Oral Q12H   enoxaparin (LOVENOX) injection  40 mg Subcutaneous Q24H   feeding supplement  237 mL Oral TID BM   methylPREDNISolone (SOLU-MEDROL) injection  40 mg Intravenous BID   [START ON 05/15/2021] multivitamin with minerals  1 tablet Oral Daily   ondansetron (ZOFRAN) IV  4 mg Intravenous Once   ondansetron (ZOFRAN) IV  4 mg Intravenous Q6H   propranolol  20 mg Oral Q6H   propylthiouracil  200 mg Oral Q4H   Continuous Infusions:  cefTRIAXone (ROCEPHIN)  IV     PRN Meds: acetaminophen, docusate sodium, morphine injection, polyethylene glycol  Allergies:  No Known Allergies  Social History:   Social History   Socioeconomic History   Marital status: Married    Spouse name: Not on file   Number of children: Not on file   Years of education: Not on file   Highest education level: High school graduate  Occupational History   Not on file  Tobacco Use   Smoking status: Former    Packs/day: 0.25     Years: 42.00    Pack years: 10.50    Types: Cigars, Cigarettes   Smokeless tobacco: Never   Tobacco comments:    4 cigars a day   Vaping Use   Vaping Use: Never used  Substance and Sexual Activity   Alcohol use: No   Drug use: Yes    Types: Marijuana    Comment: pt states he smokes every once in a while   Sexual activity: Yes    Birth control/protection: None  Other Topics Concern   Not on file  Social History Narrative   Works part time.   Social Determinants of Health   Financial Resource Strain: Medium Risk   Difficulty of Paying Living Expenses: Somewhat hard  Food Insecurity: No Food Insecurity   Worried About Charity fundraiser in the Last Year: Never true   Ran Out of Food in the Last Year: Never true  Transportation Needs: No Transportation Needs   Lack of Transportation (Medical): No   Lack of Transportation (Non-Medical): No  Physical Activity: Sufficiently Active   Days of Exercise per Week: 3 days   Minutes of Exercise per Session: 120 min  Stress: No Stress Concern Present   Feeling of Stress : Not at all  Social Connections: Socially Integrated   Frequency of Communication with Friends and Family: More than three times a week   Frequency of Social Gatherings with Friends and Family: More than three times a week   Attends Religious Services: More than 4 times per year   Active Member of [employer] or Organizations: Yes   Attends Music therapist: More than 4 times per year   Marital Status: Married  Human resources officer Violence: Not At Risk   Fear of Current or Ex-Partner: No   Emotionally Abused: No   Physically Abused: No   Sexually Abused: No     Family History:   Family History  Problem Relation Age of Onset   Hypertension Mother    Diabetes Mother        lost both legs   Kidney disease Mother    Hypertension Father    Emphysema Father    Sickle cell trait Father    Hypertension  Brother    Hyperlipidemia Brother    Sickle cell trait  Sister     ROS:  Review of Systems  Constitutional:  Positive for malaise/fatigue and weight loss. Negative for chills, diaphoresis and fever.  HENT:  Negative for congestion.   Eyes:  Negative for discharge and redness.  Respiratory:  Positive for cough and shortness of breath. Negative for sputum production and wheezing.   Cardiovascular:  Positive for chest pain. Negative for palpitations, orthopnea, claudication, leg swelling and PND.       Right-sided chest discomfort  Gastrointestinal:  Positive for nausea and vomiting. Negative for abdominal pain, blood in stool, heartburn and melena.  Musculoskeletal:  Negative for falls and myalgias.  Skin:  Negative for rash.  Neurological:  Negative for dizziness, tingling, tremors, sensory change, speech change, focal weakness, loss of consciousness and weakness.  Endo/Heme/Allergies:  Does not bruise/bleed easily.  Psychiatric/Behavioral:  Negative for substance abuse. The patient is not nervous/anxious.   All other systems reviewed and are negative.    Physical Exam/Data:   Vitals:   05/14/21 1448 05/14/21 1449 05/14/21 1450 05/14/21 1451  BP:      Pulse: (!) 129 (!) 129 (!) 129 (!) 129  Resp: 15 (!) 23 17 (!) 25  Temp: 100 F (37.8 C) (!) 100.4 F (38 C) 100 F (37.8 C) (!) 100.4 F (38 C)  TempSrc:      SpO2: 100% 100% 100% 100%  Weight:      Height:        Intake/Output Summary (Last 24 hours) at 05/14/2021 1627 Last data filed at 05/14/2021 0745 Gross per 24 hour  Intake --  Output 1325 ml  Net -1325 ml   Filed Weights   05/13/21 1208 05/14/21 0048 05/14/21 0500  Weight: 62.6 kg 72.2 kg 72.2 kg   Body mass index is 21 kg/m.   Physical Exam: General: Frail-appearing, in no acute distress. Head: Normocephalic, atraumatic, sclera non-icteric, no xanthomas, nares without discharge.  Neck: Negative for carotid bruits. JVD not elevated. Lungs: Rhonchorous breath sounds along the right side, diminished breath sounds along  the right base. Breathing is unlabored. Heart: Tachycardic and regular with S1 S2. I/VI systolic murmur LSB, no rubs, or gallops appreciated. Abdomen: Soft, non-tender, non-distended with normoactive bowel sounds. No hepatomegaly. No rebound/guarding. No obvious abdominal masses. Msk:  Strength and tone appear normal for age. Extremities: No clubbing or cyanosis. No edema. Distal pedal pulses are 2+ and equal bilaterally. Neuro: Alert and oriented X 3. No facial asymmetry. No focal deficit. Moves all extremities spontaneously. Psych:  Responds to questions appropriately with a normal affect.   EKG:  The EKG was personally reviewed and demonstrates: atrial flutter with 2-1 AV block, 151 bpm, nonspecific ST-T changes Telemetry:  Telemetry was personally reviewed and demonstrates: Atrial flutter with RVR with ventricular rates in the 130s bpm  Weights: Filed Weights   05/13/21 1208 05/14/21 0048 05/14/21 0500  Weight: 62.6 kg 72.2 kg 72.2 kg    Relevant CV Studies:  2D echo 05/14/2021: 1. The mitral valve is normal in structure. Small mobile opacity noted on  anterior and posterior mitral valve leaflets, unable to exclude valve  vegetations vs other etiology/valve cord. Clinical correlation  recommended. Moderate mitral valve  regurgitation. No evidence of mitral stenosis.   2. Left ventricular ejection fraction, by estimation, is 25 to 30%. The  left ventricle has severely decreased function. The left ventricle  demonstrates global hypokinesis. The left ventricular internal cavity size  was moderately dilated. Left  ventricular diastolic parameters are indeterminate.   3. Right ventricular systolic function is moderately reduced. The right  ventricular size is mildly enlarged. There is mildly elevated pulmonary  artery systolic pressure.   4. The aortic valve is normal in structure. Aortic valve regurgitation is  not visualized. No aortic stenosis is present.   5. There is borderline  dilatation of the aortic root, measuring 38 mm.   6. The inferior vena cava is normal in size with greater than 50%  respiratory variability, suggesting right atrial pressure of 3 mmHg. __________  ETT 09/19/2014: There was no ST segment deviation noted during stress.  Laboratory Data:  Chemistry Recent Labs  Lab 05/13/21 1211 05/14/21 0456  NA 135 133*  K 4.2 4.8  CL 107 103  CO2 22 22  GLUCOSE 130* 113*  BUN 20 27*  CREATININE 1.13 1.28*  CALCIUM 8.9 8.6*  GFRNONAA >60 >60  ANIONGAP 6 8    Recent Labs  Lab 05/13/21 1445  PROT 6.6  ALBUMIN 3.6  AST 65*  ALT 119*  ALKPHOS 65  BILITOT 1.1   Hematology Recent Labs  Lab 05/13/21 1211 05/14/21 0456  WBC 7.7 9.2  RBC 4.33 4.43  HGB 13.6 14.2  HCT 40.8 42.8  MCV 94.2 96.6  MCH 31.4 32.1  MCHC 33.3 33.2  RDW 12.8 12.9  PLT 164 150   Cardiac EnzymesNo results for input(s): TROPONINI in the last 168 hours. No results for input(s): TROPIPOC in the last 168 hours.  BNP Recent Labs  Lab 05/13/21 1211  BNP 988.2*    DDimer  Recent Labs  Lab 05/14/21 0112  DDIMER 1.37*    Radiology/Studies:  CT Angio Chest Pulmonary Embolism (PE) W or WO Contrast  Result Date: 05/14/2021 IMPRESSION: No pulmonary embolism. Mild global cardiomegaly. Morphologic changes in keeping with pulmonary arterial hypertension. Marked reflux of contrast into the hepatic venous system in keeping with at least some degree of right heart failure. Multifocal pulmonary infiltrate throughout the right lung and associated asymmetric bronchial wall thickening in keeping with multifocal bronchopneumonia. Associated small right pleural effusion. Superimposed asymmetric pulmonary edema throughout the right lung Emphysema (ICD10-J43.9). Electronically Signed   By: Fidela Salisbury M.D.   On: 05/14/2021 02:30   [place] CHEST (PLEURAL EFFUSION)  Result Date: 05/14/2021 IMPRESSION: Small right pleural effusion.  No thoracentesis performed. Electronically Signed    By: Albin Felling M.D.   On: 05/14/2021 15:57   DG Chest Portable 1 View  Result Date: 05/13/2021 IMPRESSION: Progression of right lower lobe airspace disease.  Favor pneumonia. Electronically Signed   By: Franchot Gallo M.D.   On: 05/13/2021 19:42   DG Chest Portable 1 View  Result Date: 05/13/2021 IMPRESSION: New mild-to-moderate interstitial thickening may represent mild to moderate interstitial pulmonary edema. Progressive chronic scarring can have a similar appearance. Electronically Signed   By: Yvonne Kendall M.D.   On: 05/13/2021 13:11   US THYROID  Result Date: 05/14/2021 CLINICAL DATA:  Thyroid adenoma Thyroid storm EXAM: THYROID ULTRASOUND TECHNIQUE: Ultrasound examination of the thyroid gland and adjacent soft tissues was performed. COMPARISON:  12/01/2017 FINDINGS: Parenchymal Echotexture: Moderately heterogeneous Isthmus: 0.4 cm Right lobe: 8.1 x 3.9 x 3.0 cm Left lobe: 5.2 x 1.9 x 1.9 cm _________________________________________________________ Estimated total number of nodules >/= 1 cm: 3 Number of spongiform nodules >/=  2 cm not described below (TR1): 0 Number of mixed cystic and solid nodules >/= 1.5 cm not described below ([place]): 0  _________________________________________________________ Nodule 1: 6.7 x 3.9 x 3.4 cm heterogeneous isoechoic mixed solid cystic region appears to occupy the entire right thyroid lobe. It does not have discrete borders of the nodule and is favored to be a pseudo nodule. _________________________________________________________ Nodule # 2: Location: Left; mid Maximum size: 1.9 cm; Other 2 dimensions: 1.7 x 1.1 cm Composition: solid/almost completely solid (2) Echogenicity: isoechoic (1) Shape: taller-than-wide (3) Margins: smooth (0) Echogenic foci: none (0) ACR TI-RADS total points: 6. ACR TI-RADS risk category: TR4 (4-6 points). ACR TI-RADS recommendations: **Given size (>/= 1.5 cm) and appearance, fine needle aspiration of this moderately suspicious nodule  should be considered based on TI-RADS criteria. _________________________________________________________ Nodule 3: 2.2 x 1.4 x 0.8 cm spongiform nodule in the inferior left thyroid lobe does not meet criteria for imaging surveillance or FNA. IMPRESSION: Nodule 2 (TI-RADS 4), measuring 1.9 cm, located in the mid left thyroid lobe, meets criteria for FNA. The above is in keeping with the ACR TI-RADS recommendations - J Am Coll Radiol 2017;14:587-595. Electronically Signed   By: Miachel Roux M.D.   On: 05/14/2021 16:13    Assessment and Plan:   1.  Atrial flutter with 2-1 AV block: -Likely in the setting of the patient's underlying thyroid storm and multifocal bronchopneumonia -Hopefully ventricular rates will improve with treatment of underlying pathologic processes -He remains in atrial flutter with RVR -Currently on propanolol given underlying thyroid storm -CHADS2VASc 2 (HTN, age x 1) -Initiate heparin drip -N.p.o. at midnight -If his tachycardic rates persist, we may need to consider TEE guided DCCV, although he may not hold sinus rhythm but given his underlying acute illnesses -DCCV would certainly need to be pursued if he becomes unstable, fortunately for now he is hemodynamically stable  2.  HFrEF: -Echo this admission with an EF of 25 to 30%,  -Cardiomyopathy possibly tachycardia mediated -Give another one-time dose of IV Lasix and assess UOP -Propanolol for now given thyroid storm with recommendation to escalate GDMT as tolerated following improvement in clinical course of thyroid dysfunction -Once his ventricular rates and underlying acute illness improve, would recommend repeat limited echo to reevaluate LV systolic function -If his cardiomyopathy persists, consider ischemic evaluation  3.  Mitral valve regurgitation/mobile opacity: -Echo demonstrated a small mobile opacity on the anterior and posterior mitral valve leaflets, unable to exclude valvular vegetation versus alternative  etiology/valve cord -Moderate mitral regurgitation was noted -Blood cultures are no growth to date x2 -Consider alternative imaging modality as clinically indicated  4.  Acute hypoxic respiratory distress: -Multifactorial including multifocal pneumonia, acute HFrEF, and atrial flutter with RVR -Management of atrial flutter and cardiomyopathy as outlined above with ongoing management of his pneumonia per primary service  5.  Thyroid storm: -Management per CCM -Awaiting bed at [facility] for inpatient endocrinology evaluation   For questions or updates, please contact [place] Please consult [email or web address] for contact info under Cardiology/STEMI.   Signed, Christell Faith, PA-C [place] Pager: [phone] 05/14/2021, 4:27 PM

## 2021-05-14 NOTE — H&P (Signed)
NAME:  Tyler Cohen, MRN:  [id number], DOB:  05-Apr-1956, LOS: 1 ADMISSION DATE:  05/13/2021, CONSULTATION DATE:  05/14/2021 REFERRING MD:  Duffy Bruce, MD CHIEF COMPLAINT:  SOB    HPI  66 y.o male with significant PMH of hypertension, Hyperlipidemia, renal mass, adrenal mass, former smoker, hyperthyroidism treated with PTU and methimazole, and multinodular goiter (US Thyroid 2019) who presented to the ED with chief complaints of progressive shortness of breath since yesterday.  Patient state he was seen at the urgent care for reports of acute dyspnea associated with dry cough, chest tightness, palpitations, nausea and vomiting. Patient was found to be tachycardic to 156 in the clinic with O2 sat 100% on room air and afebrile. He was sent to the ER for further evaluation and management given symptomatology, tachycardia.  ED Course: In the emergency department, the temperature was 37.1C, the heart rate 150 beats/minute, the blood pressure 112/85 mm Hg, the respiratory rate 28 breaths/minute, and the oxygen saturation 98% on 2L. Labs revealed unremarkable CBC, slightly elevated AST/ALT: 65/119 otherwise unremarkable CMP. Thyroid function tests ordered on admission revealed a thyroid-stimulating hormone (TSH) level of <0.01 ?U/mL and a free thyroxine level of 3.49ng/dL, confirming the diagnosis of thyrotoxicosis. The patient was started on 1 gram of propylthiouracil, 20 mg of propranolol, 100 mg of hydrocortisone, 4 grams of cholestyramine, 20 mg IV Lasix as well as empiric antibiotics for possible right basilar pneumonia noted on chest x-ray. Patient remains tachycardic, borderline hypoxic after receiving medications.  He has been accepted to [facility] but no bed will be available for at least the next 12 hours.  Given patient's critical illness, PCCM team consulted for admission pending transfer.  Past Medical History  Hyperthyroidism Multinodular Goiter Adrenal Mass Renal Mass Tobacco  abuse HTN HLD  Significant Hospital Events   2/9: Admitted to ICU with Thyroid Storm pending transfer to [facility]  Consults:  PCCM  Procedures:  None  Significant Diagnostic Tests:  2/8: Chest Xray>New mild-to-moderate interstitial thickening may represent mild to moderate interstitial pulmonary edema. Progressive chronic scarring can have a similar appearance 2/9: CTA Chest>No pulmonary embolism. Mild global cardiomegaly. Morphologic changes in keeping with pulmonary arterial hypertension. Marked reflux of contrast into the hepatic venous system in keeping with at least some degree of right heart failure.   Multifocal pulmonary infiltrate throughout the right lung and associated asymmetric bronchial wall thickening in keeping with multifocal bronchopneumonia. Associated small right pleural effusion.   Superimposed asymmetric pulmonary edema throughout the right lung   Emphysema (ICD10-J43.9).    Micro Data:  2/8: SARS-CoV-2 PCR> negative 2/8: Influenza PCR> negative 2/9: Blood culture x2> 2/9: Urine Culture> 2/9: MRSA PCR>>  2/9: Strep pneumo urinary antigen> 2/9: Legionella urinary antigen>   Antimicrobials:  Doxy 2/8> Ceftriaxone 2/8>  OBJECTIVE  Blood pressure 100/86, pulse (!) 130, temperature 98.4 F (36.9 C), temperature source Oral, resp. rate (!) 21, height 6\' 1"  (1.854 m), weight 62.6 kg, SpO2 (!) 87 %.        Intake/Output Summary (Last 24 hours) at 05/14/2021 0034 Last data filed at 05/13/2021 1934 Gross per 24 hour  Intake 1000 ml  Output 350 ml  Net 650 ml   Filed Weights   05/13/21 1208  Weight: 62.6 kg    Physical Examination  GENERAL: year-old critically ill patient lying in the bed lethargic.  EYES: Pupils equal, round, reactive to light and accommodation. No scleral icterus. Extraocular muscles intact.  HEENT: Head atraumatic, normocephalic. Oropharynx and nasopharynx clear.  NECK:  Supple, no jugular venous distention. Mild palpable  thyroid enlargement, no tenderness.  LUNGS: Decreased breath sounds bilaterally, mild crackles , wheezing, rales, and rhonchi. Mild use of accessory muscles of respiration.  CARDIOVASCULAR: S1, S2 normal. Systolic murmurs present, rubs, or gallops.  ABDOMEN: Soft, nontender, nondistended. Bowel sounds present. No organomegaly or mass.  EXTREMITIES: No pedal edema, cyanosis, or clubbing.  NEUROLOGIC: Cranial nerves II through XII are intact.  Muscle strength 5/5 in all extremities. Sensation intact. Gait not checked.  PSYCHIATRIC: The patient is alert and oriented x 3.  SKIN: No obvious rash, lesion, or ulcer.   Labs/imaging that I havepersonally reviewed  (right click and "Reselect all SmartList Selections" daily)     Labs   CBC: Recent Labs  Lab 05/13/21 1211  WBC 7.7  HGB 13.6  HCT 40.8  MCV 94.2  PLT 878    Basic Metabolic Panel: Recent Labs  Lab 05/13/21 1211  NA 135  K 4.2  CL 107  CO2 22  GLUCOSE 130*  BUN 20  CREATININE 1.13  CALCIUM 8.9   GFR: Estimated Creatinine Clearance: 57.7 mL/min (by C-G formula based on SCr of 1.13 mg/dL). Recent Labs  Lab 05/13/21 1211  WBC 7.7    Liver Function Tests: Recent Labs  Lab 05/13/21 1445  AST 65*  ALT 119*  ALKPHOS 65  BILITOT 1.1  PROT 6.6  ALBUMIN 3.6   No results for input(s): LIPASE, AMYLASE in the last 168 hours. No results for input(s): AMMONIA in the last 168 hours.  ABG No results found for: PHART, PCO2ART, PO2ART, HCO3, TCO2, ACIDBASEDEF, O2SAT   Coagulation Profile: No results for input(s): INR, PROTIME in the last 168 hours.  Cardiac Enzymes: No results for input(s): CKTOTAL, CKMB, CKMBINDEX, TROPONINI in the last 168 hours.  HbA1C: HB A1C (BAYER DCA - WAIVED)  Date/Time Value Ref Range Status  08/22/2018 08:47 AM 5.4 <7.0 % Final    Comment:                                          Diabetic Adult            <7.0                                       Healthy Adult        4.3 - 5.7                                                            (DCCT/NGSP) American Diabetes Association's Summary of Glycemic Recommendations for Adults with Diabetes: Hemoglobin A1c <7.0%. More stringent glycemic goals (A1c <6.0%) may further reduce complications at the cost of increased risk of hypoglycemia.     CBG: No results for input(s): GLUCAP in the last 168 hours.  Review of Systems:   Review of Systems  Constitutional:  Positive for malaise/fatigue and weight loss.  HENT: Negative.    Respiratory:  Positive for cough, sputum production, shortness of breath and wheezing.   Cardiovascular:  Positive for chest pain and palpitations.  Gastrointestinal:  Positive for abdominal pain, nausea and vomiting.  Genitourinary: Negative.   Musculoskeletal: Negative.   Neurological: Negative.   Endo/Heme/Allergies: Negative.   Psychiatric/Behavioral:  The patient is nervous/anxious.    Past Medical History  He,  has a past medical history of Arthritis, Heart murmur, Hyperlipidemia, Macrocytosis, Thyroid disease, and Tobacco abuse.   Surgical History    Past Surgical History:  Procedure Laterality Date   COLONOSCOPY WITH PROPOFOL N/A 12/01/2015   Procedure: COLONOSCOPY WITH PROPOFOL;  Surgeon: Lucilla Lame, MD;  Location: [facility];  Service: Endoscopy;  Laterality: N/A;   POLYPECTOMY  12/01/2015   Procedure: POLYPECTOMY;  Surgeon: Lucilla Lame, MD;  Location: [facility];  Service: Endoscopy;;     Social History   reports that he has quit smoking. His smoking use included cigars and cigarettes. He has a 10.50 pack-year smoking history. He has never used smokeless tobacco. He reports current drug use. Drug: Marijuana. He reports that he does not drink alcohol.   Family History   His family history includes Diabetes in his mother; Emphysema in his father; Hyperlipidemia in his brother; Hypertension in his brother, father, and mother; Kidney disease in his mother; Sickle cell  trait in his father and sister.   Allergies No Known Allergies   Home Medications  Prior to Admission medications   Medication Sig Start Date End Date Taking? Authorizing Provider  albuterol (VENTOLIN HFA) 108 (90 Base) MCG/ACT inhaler Inhale 2 puffs into the lungs every 6 (six) hours as needed for wheezing or shortness of breath. 03/19/21   Park Liter P, DO  aspirin 81 MG tablet Take 81 mg by mouth daily.    [provider]  atorvastatin (LIPITOR) 20 MG tablet TAKE 1 TABLET(20 MG) BY MOUTH AT BEDTIME 03/19/21   Johnson, Megan P, DO  b complex vitamins tablet Take 1 tablet by mouth daily.    [provider]  clobetasol ointment (TEMOVATE) 9.37 % Apply 1 application topically 2 (two) times daily. 04/09/20   Johnson, Megan P, DO  clotrimazole-betamethasone (LOTRISONE) cream Apply 1 application topically 2 (two) times daily. 04/09/20   Johnson, Megan P, DO  lidocaine (XYLOCAINE) 5 % ointment Apply 1 application topically as needed. 04/09/20   Johnson, Megan P, DO  methimazole (TAPAZOLE) 10 MG tablet TAKE 2 TABLETS(20 MG) BY MOUTH TWICE DAILY 03/19/21   Johnson, Megan P, DO  metoCLOPramide (REGLAN) 10 MG tablet Take by mouth. 03/13/21 03/23/21  [provider]  propylthiouracil (PTU) 50 MG tablet Take 2 tablets (100 mg total) by mouth 3 (three) times daily. 03/19/21   Park Liter P, DO  tamsulosin (FLOMAX) 0.4 MG CAPS capsule TAKE 1 CAPSULE(0.4 MG) BY MOUTH DAILY 03/19/21   Park Liter P, DO  Scheduled Meds:  cholestyramine  4 g Oral TID   enoxaparin (LOVENOX) injection  40 mg Subcutaneous Q24H   hydrocortisone sodium succinate  50 mg Intravenous Q6H   propranolol  20 mg Oral Q6H   propylthiouracil  200 mg Oral Q4H   Continuous Infusions:  magnesium sulfate bolus IVPB     PRN Meds:.docusate sodium, morphine injection, polyethylene glycol  Active Hospital Problem list   Thyroid Storm likely secondary to untreated Toxic Multinodular Goiter High output  CHF SVT/Atrial Flutter Elevated LFTs  Assessment & Plan:  Thyroid Storm likely secondary to Toxic Multinodular Goiter  Burch-Wartofsky score: 65 on admission PMHx: Multinodular goiter, Adrenal mass, Hyperthyroidism noncompliant with meds -Continue propylthiouracil 200 mg q4 will transition to methimazole once patient is improving clinically -Potassium Iodide 5 drops (0.25 mL or 250mg )  orally every 6 hours -Propranolol 20 mg po q6hr. Monitor for worsening cardiogenic pulmonary edema/shock -Continue Stress dose 50 mg IV Q6hr following loading dose -Start Cholestyramine 4 grams orally q6hrs. -Blood cultures & infectious workup as indicated. -Monitor I&O's / urinary output -Follow BMP+ mag. Replace electrolytes as indicated -Avoid aspirin or NSAIDS  -Obtain US Thyroid -Endocrinology not available inpatient, Patient has been accepted to [facility] hospital by Dr. Illene Labrador     Acute Hypoxic Respiratory Failure secondary to Pulmonary Edema and Multifocal Pneumonia -Supplemental O2 as needed to maintain O2 saturations > 92% -High risk for intubation -Follow intermittent ABG and chest x-ray as needed -Obtain CTA Chest -F/u blood and cultures, trend Lactic/ PCT -Monitor WBC/ fever curve -Start Empiric antibiotics with Ceftriaxone and Doxy -Gentle IVF hydration as needed -Pressors for MAP goal >65 -Encourage smoking cessation  Acute Decompensated CHF likely high output in the setting of Thyroid storm Atrial Flutter/SVT Hx: Hypertension, HLD  -Continuous cardiac monitoring -Serial EKG -Maintain MAP greater than 65 -Rate control with Propranolol as above -PRN Lasix as BP and renal function permits -Obtain 2D Echocardiogram   Elevated LFTs likely in the setting of above Mild elevation in AST, ALT, LDH, bilirubin, ALP -Trend CMP -Avoid Hepatotoxins -Transition PTU to Methimazole if worsening Liver enzymes   Best practice:  Diet:  Oral Pain/Anxiety/Delirium protocol (if  indicated): No VAP protocol (if indicated): Not indicated DVT prophylaxis: LMWH GI prophylaxis: PPI Glucose control:  SSI Yes Central venous access:  N/A Arterial line:  N/A Foley:  Yes, and it is still needed Mobility:  bed rest  PT consulted: N/A Last date of multidisciplinary goals of care discussion [2/8] Code Status:  full code Disposition: ICU   = Goals of Care = Code Status Order: FULL  Primary Emergency Contact: Wells,Jackie, Home Phone: [phone] Wishes to pursue full aggressive treatment and intervention options, including CPR and intubation, but goals of care will be addressed on going with family if that should become necessary.   Critical care time: 45 minutes     Rufina Falco, DNP, CCRN, FNP-C, AGACNP-BC Acute Care Nurse Practitioner  [facility] Pager: [phone] [place] at [facility]  .

## 2021-05-14 NOTE — ED Notes (Signed)
Transport by Judith Part RN to ICU. Awake and alert. Oriented x4. Skin p/w/d. RR even and nonlabored. Occasional loose cough. Nonproductive. No complaints of pain. Aflutter rhythm HR consistently in 130's. Last BP 104/75 MAP 82. On 2 lpm per [place] to maintain O2 sats > 90%. Report and cares to [facility] ICU RN. Saline Locked.

## 2021-05-14 NOTE — Plan of Care (Signed)

## 2021-05-14 NOTE — TOC Initial Note (Signed)
Transition of Care [facility]) - Initial/Assessment Note    Patient Details  Name: Tyler Cohen MRN: [id number] Date of Birth: [birth year]  Transition of Care [facility]) CM/SW Contact:    Shelbie Hutching, RN Phone Number: 05/14/2021, 11:54 AM  Clinical Narrative:                 Patient admitted to the hospital for thyroid crisis.  Per report patient has not been taking his medications for at least a month.  Patient is pending transfer to [facility] for further medical management.   TOC will follow.   Expected Discharge Plan: Acute to Acute Transfer Barriers to Discharge: Continued Medical Work up   Patient Goals and CMS Choice        Expected Discharge Plan and Services Expected Discharge Plan: Acute to Acute Transfer                                              Prior Living Arrangements/Services                       Activities of Daily Living Home Assistive Devices/Equipment: None ADL Screening (condition at time of admission) Patient's cognitive ability adequate to safely complete daily activities?: Yes Is the patient deaf or have difficulty hearing?: No Does the patient have difficulty seeing, even when wearing glasses/contacts?: No Does the patient have difficulty concentrating, remembering, or making decisions?: No Patient able to express need for assistance with ADLs?: Yes Does the patient have difficulty dressing or bathing?: No Independently performs ADLs?: Yes (appropriate for developmental age) Does the patient have difficulty walking or climbing stairs?: No Weakness of Legs: None Weakness of Arms/Hands: None  Permission Sought/Granted                  Emotional Assessment              Admission diagnosis:  Thyroid adenoma, toxic [E05.10] Thyroid crisis or storm [E05.91] Atrial flutter with rapid ventricular response ([place]) [I48.92] Thyrotoxicosis with toxic multinodular goiter and without thyroid storm [E05.20] Patient Active Problem  List   Diagnosis Date Noted   Thyroid crisis or storm 05/13/2021   Aortic atherosclerosis ([place]) 06/25/2020   BPH (benign prostatic hyperplasia) 10/27/2018   Multinodular goiter 12/08/2017   Hyperthyroidism 12/08/2017   Left thyroid nodule 05/04/2017   Adrenal mass ([place]) 05/04/2017   Renal mass 01/17/2017   Liver lesion 11/29/2016   Advanced care planning/counseling discussion 11/02/2016   Digital mucinous cyst of finger of right hand 11/02/2016   Benign neoplasm of sigmoid colon    Psoriasis 09/03/2015   Tobacco abuse    Macrocytosis    Hyperlipidemia 08/29/2014   Dental disease 08/29/2014   PCP:  Valerie Roys, DO Pharmacy:   [facility] [id number], [place] AT [place] [address] Phone: [phone] Fax: [place], [place] [address] Phone: [phone] Fax: [phone]     Social Determinants of Health (SDOH) Interventions    Readmission Risk Interventions No flowsheet data found.

## 2021-05-14 NOTE — Progress Notes (Signed)
[facility] Progress Note Patient Name: DICK HARK DOB: 1955-09-19 MRN: [id number]   Date of Service  05/14/2021  HPI/Events of Note  Patient admitted with thyrotoxicosis, atrial flutter with 2:1 conduction, and pneumonia.  eICU Interventions  New Patient Evaluation.        Netanya Yazdani U Rocklyn Mayberry 05/14/2021, 1:09 AM

## 2021-05-15 ENCOUNTER — Other Ambulatory Visit: Payer: Medicare HMO | Admitting: Urology

## 2021-05-15 DIAGNOSIS — E440 Moderate protein-calorie malnutrition: Secondary | ICD-10-CM

## 2021-05-15 DIAGNOSIS — I4892 Unspecified atrial flutter: Secondary | ICD-10-CM | POA: Diagnosis not present

## 2021-05-15 DIAGNOSIS — I5020 Unspecified systolic (congestive) heart failure: Secondary | ICD-10-CM | POA: Diagnosis not present

## 2021-05-15 DIAGNOSIS — E0591 Thyrotoxicosis, unspecified with thyrotoxic crisis or storm: Secondary | ICD-10-CM | POA: Diagnosis not present

## 2021-05-15 HISTORY — DX: Moderate protein-calorie malnutrition: E44.0

## 2021-05-15 LAB — COMPREHENSIVE METABOLIC PANEL
ALT: 203 U/L — ABNORMAL HIGH (ref 0–44)
AST: 128 U/L — ABNORMAL HIGH (ref 15–41)
Albumin: 3.5 g/dL (ref 3.5–5.0)
Alkaline Phosphatase: 72 U/L (ref 38–126)
Anion gap: 11 (ref 5–15)
BUN: 49 mg/dL — ABNORMAL HIGH (ref 8–23)
CO2: 19 mmol/L — ABNORMAL LOW (ref 22–32)
Calcium: 8.6 mg/dL — ABNORMAL LOW (ref 8.9–10.3)
Chloride: 101 mmol/L (ref 98–111)
Creatinine, Ser: 2.22 mg/dL — ABNORMAL HIGH (ref 0.61–1.24)
GFR, Estimated: 32 mL/min — ABNORMAL LOW (ref >60)
Glucose, Bld: 157 mg/dL — ABNORMAL HIGH (ref 70–99)
Potassium: 5.1 mmol/L (ref 3.5–5.1)
Sodium: 131 mmol/L — ABNORMAL LOW (ref 135–145)
Total Bilirubin: 0.8 mg/dL (ref 0.3–1.2)
Total Protein: 6.5 g/dL (ref 6.5–8.1)

## 2021-05-15 LAB — CBC
HCT: 44.2 % (ref 39.0–52.0)
Hemoglobin: 14.8 g/dL (ref 13.0–17.0)
MCH: 31.8 pg (ref 26.0–34.0)
MCHC: 33.5 g/dL (ref 30.0–36.0)
MCV: 94.8 fL (ref 80.0–100.0)
Platelets: 160 10*3/uL (ref 150–400)
RBC: 4.66 MIL/uL (ref 4.22–5.81)
RDW: 12.8 % (ref 11.5–15.5)
WBC: 10.2 10*3/uL (ref 4.0–10.5)
nRBC: 0 % (ref 0.0–0.2)

## 2021-05-15 LAB — HEPARIN LEVEL (UNFRACTIONATED)
Heparin Unfractionated: 0.44 IU/mL (ref 0.30–0.70)
Heparin Unfractionated: 0.34 IU/mL (ref 0.30–0.70)

## 2021-05-15 LAB — URINE DRUG SCREEN, QUALITATIVE (ARMC ONLY)
Amphetamines, Ur Screen: NOT DETECTED
Barbiturates, Ur Screen: NOT DETECTED
Benzodiazepine, Ur Scrn: NOT DETECTED
Cannabinoid 50 Ng, Ur ~~LOC~~: POSITIVE — AB
Cocaine Metabolite,Ur ~~LOC~~: NOT DETECTED
MDMA (Ecstasy)Ur Screen: NOT DETECTED
Methadone Scn, Ur: NOT DETECTED
Opiate, Ur Screen: POSITIVE — AB
Phencyclidine (PCP) Ur S: NOT DETECTED
Tricyclic, Ur Screen: NOT DETECTED

## 2021-05-15 LAB — PROCALCITONIN: Procalcitonin: 0.13 ng/mL

## 2021-05-15 LAB — PHOSPHORUS: Phosphorus: 7.3 mg/dL — ABNORMAL HIGH (ref 2.5–4.6)

## 2021-05-15 LAB — LEGIONELLA PNEUMOPHILA SEROGP 1 UR AG: L. pneumophila Serogp 1 Ur Ag: NEGATIVE

## 2021-05-15 LAB — MAGNESIUM: Magnesium: 2.3 mg/dL (ref 1.7–2.4)

## 2021-05-15 MED ORDER — LACTATED RINGERS IV SOLN: INTRAVENOUS | Status: DC

## 2021-05-15 MED ORDER — LIDOCAINE 5 % EX PTCH
1.0000 | MEDICATED_PATCH | CUTANEOUS | Status: DC
  Administered 2021-05-15: 1 via TRANSDERMAL
  Filled 2021-05-15: qty 1

## 2021-05-15 NOTE — Consult Note (Signed)
PHARMACY CONSULT NOTE  Pharmacy Consult for Electrolyte Monitoring and Replacement   Recent Labs: Potassium (mmol/L)  Date Value  05/15/2021 5.1   Magnesium (mg/dL)  Date Value  05/15/2021 2.3   Calcium (mg/dL)  Date Value  05/15/2021 8.6 (L)   Albumin (g/dL)  Date Value  05/15/2021 3.5  03/19/2021 3.6 (L)   Phosphorus (mg/dL)  Date Value  05/15/2021 7.3 (H)   Sodium (mmol/L)  Date Value  05/15/2021 131 (L)  03/19/2021 136   Assessment: Patient is a 66 y/o M with medical history including multinodular goiter / hyperthyroidism, BPH, tobacco abuse who is admitted with thyroid storm and respiratory failure in setting of pulmonary edema and multifocal pneumonia. Pharmacy consulted to assist with electrolyte monitoring and replacement as indicated.  Goal of Therapy:  K 4, Mg 2 and all other electrolytes within normal limits  Plan:  Scr 1.28>2.22 --No electrolyte replacement indicated at this time --Follow-up electrolytes with AM labs tomorrow  Lorna Dibble, PharmD, [facility] Clinical Pharmacist 05/15/2021 7:53 AM

## 2021-05-15 NOTE — Progress Notes (Signed)
ANTICOAGULATION CONSULT NOTE  Pharmacy Consult for heparin Indication: atrial fibrillation  No Known Allergies  Patient Measurements: Height: 6\' 1"  (185.4 cm) Weight: 72.2 kg (159 lb 2.8 oz) IBW/kg (Calculated) : 79.9 Heparin Dosing Weight: 72.2 kg  Vital Signs: Temp: 99.7 F (37.6 C) (02/10 0200) Temp Source: Bladder (02/10 0000) BP: 92/76 (02/10 0200) Pulse Rate: 125 (02/10 0200)  Labs: Recent Labs    05/13/21 1211 05/13/21 1424 05/14/21 0112 05/14/21 0456 05/15/21 0048  HGB 13.6  --   --  14.2  --   HCT 40.8  --   --  42.8  --   PLT 164  --   --  150  --   APTT  --   --  30  --   --   LABPROT  --   --  15.8*  --   --   INR  --   --  1.3*  --   --   HEPARINUNFRC  --   --   --   --  0.44  CREATININE 1.13  --   --  1.28*  --   CKTOTAL  --   --  182  --   --   TROPONINIHS 18* 16  --   --   --      Estimated Creatinine Clearance: 58.8 mL/min (A) (by C-G formula based on SCr of 1.28 mg/dL (H)).   Medical History: Past Medical History:  Diagnosis Date   Arthritis    Heart murmur    Hyperlipidemia    Macrocytosis    Thyroid disease    Tobacco abuse       Assessment: Patient is a 66 y/o M with medical history including multinodular goiter / hyperthyroidism, BPH, tobacco abuse who is admitted with thyroid storm and respiratory failure in setting of pulmonary edema and multifocal pneumonia. Patient with atrial fibrillation. Pharmacy consulted for management of heparin.  Goal of Therapy:  Heparin level 0.3-0.7 units/ml Monitor platelets by anticoagulation protocol: Yes  2/10 0048 HL 0.44, therapeutic x 1   Plan:  --Continue heparin infusion at 850 units/hr --Recheck HL in 6 hrs to confirm  --CBC daily while on heparin infusion  Renda Rolls, PharmD, [facility] 05/15/2021 2:13 AM

## 2021-05-15 NOTE — Progress Notes (Signed)
Progress Note  Patient Name: Tyler Cohen Date of Encounter: 05/15/2021  Primary Cardiologist: New to [facility] - consult by Rockey Situ (previously evaluated by Dr. Rockey Situ in 08/2014)  Subjective   He continues to note right-sided chest discomfort that was improved with ice pack last night. No further nausea or vomiting. No palpitations. Still awaiting bed a [facility].  Documented urine output 1.2 L for the past 24 hours.  Inpatient Medications    Scheduled Meds:  Chlorhexidine Gluconate Cloth  6 each Topical Q0600   cholestyramine  4 g Oral TID   doxycycline  100 mg Oral Q12H   feeding supplement  237 mL Oral TID BM   methylPREDNISolone (SOLU-MEDROL) injection  40 mg Intravenous BID   multivitamin with minerals  1 tablet Oral Daily   ondansetron (ZOFRAN) IV  4 mg Intravenous Once   ondansetron (ZOFRAN) IV  4 mg Intravenous Q6H   propranolol  20 mg Oral Q6H   propylthiouracil  200 mg Oral Q4H   Continuous Infusions:  cefTRIAXone (ROCEPHIN)  IV Stopped (05/14/21 2158)   heparin 850 Units/hr (05/15/21 0800)   promethazine (PHENERGAN) injection (IM or IVPB) Stopped (05/15/21 0217)   PRN Meds: acetaminophen, docusate sodium, morphine injection, polyethylene glycol, promethazine (PHENERGAN) injection (IM or IVPB)   Vital Signs    Vitals:   05/15/21 0605 05/15/21 0700 05/15/21 0800 05/15/21 0900  BP: 101/88 100/84 (!) 110/94 94/82  Pulse: (!) 126 (!) 128 (!) 126   Resp: (!) 22 (!) 9 17 16   Temp: 99 F (37.2 C) 99.1 F (37.3 C) 99.3 F (37.4 C) 99.3 F (37.4 C)  TempSrc:   Bladder   SpO2: 100% 100% 99%   Weight:      Height:        Intake/Output Summary (Last 24 hours) at 05/15/2021 1218 Last data filed at 05/15/2021 0800 Gross per 24 hour  Intake 785.52 ml  Output 1380 ml  Net -594.48 ml   Filed Weights   05/14/21 0048 05/14/21 0500 05/15/21 0500  Weight: 72.2 kg 72.2 kg 73.1 kg    Telemetry    Atrial flutter with RVR, 120s bpm - Personally Reviewed  ECG    No  new tracings - Personally Reviewed  Physical Exam   GEN: Frail appearing; no acute distress.   Neck: No JVD. Cardiac: Tachycardic and regular, no murmurs, rubs, or gallops.  Respiratory: Diminished and rhonchorus breath sounds bilaterally.  GI: Soft, nontender, non-distended.   MS: No edema; No deformity. Neuro:  Alert and oriented x 3; Nonfocal.  Psych: Normal affect.  Labs    Chemistry Recent Labs  Lab 05/13/21 1211 05/13/21 1445 05/14/21 0456 05/15/21 0338  NA 135  --  133* 131*  K 4.2  --  4.8 5.1  CL 107  --  103 101  CO2 22  --  22 19*  GLUCOSE 130*  --  113* 157*  BUN 20  --  27* 49*  CREATININE 1.13  --  1.28* 2.22*  CALCIUM 8.9  --  8.6* 8.6*  PROT  --  6.6  --  6.5  ALBUMIN  --  3.6  --  3.5  AST  --  65*  --  128*  ALT  --  119*  --  203*  ALKPHOS  --  65  --  72  BILITOT  --  1.1  --  0.8  GFRNONAA >60  --  >60 32*  ANIONGAP 6  --  8 11  Hematology Recent Labs  Lab 05/13/21 1211 05/14/21 0456 05/15/21 0333  WBC 7.7 9.2 10.2  RBC 4.33 4.43 4.66  HGB 13.6 14.2 14.8  HCT 40.8 42.8 44.2  MCV 94.2 96.6 94.8  MCH 31.4 32.1 31.8  MCHC 33.3 33.2 33.5  RDW 12.8 12.9 12.8  PLT 164 150 160    Cardiac EnzymesNo results for input(s): TROPONINI in the last 168 hours. No results for input(s): TROPIPOC in the last 168 hours.   BNP Recent Labs  Lab 05/13/21 1211  BNP 988.2*     DDimer  Recent Labs  Lab 05/14/21 0112  DDIMER 1.37*     Radiology    DG Abd 1 View  Result Date: 05/14/2021 IMPRESSION: Nonobstructive bowel gas pattern. Electronically Signed   By: Keane Police D.O.   On: 05/14/2021 17:56   CT Angio Chest Pulmonary Embolism (PE) W or WO Contrast  Result Date: 05/14/2021 IMPRESSION: No pulmonary embolism. Mild global cardiomegaly. Morphologic changes in keeping with pulmonary arterial hypertension. Marked reflux of contrast into the hepatic venous system in keeping with at least some degree of right heart failure. Multifocal  pulmonary infiltrate throughout the right lung and associated asymmetric bronchial wall thickening in keeping with multifocal bronchopneumonia. Associated small right pleural effusion. Superimposed asymmetric pulmonary edema throughout the right lung Emphysema (ICD10-J43.9). Electronically Signed   By: Fidela Salisbury M.D.   On: 05/14/2021 02:30   [place] CHEST (PLEURAL EFFUSION)  Result Date: 05/14/2021 IMPRESSION: Small right pleural effusion.  No thoracentesis performed. Electronically Signed   By: Albin Felling M.D.   On: 05/14/2021 15:57   DG Chest Portable 1 View  Result Date: 05/13/2021 IMPRESSION: Progression of right lower lobe airspace disease.  Favor pneumonia. Electronically Signed   By: Franchot Gallo M.D.   On: 05/13/2021 19:42   DG Chest Portable 1 View  Result Date: 05/13/2021 IMPRESSION: New mild-to-moderate interstitial thickening may represent mild to moderate interstitial pulmonary edema. Progressive chronic scarring can have a similar appearance. Electronically Signed   By: Yvonne Kendall M.D.   On: 05/13/2021 13:11  US THYROID  Result Date: 05/14/2021 CLINICAL DATA:  Thyroid adenoma Thyroid storm EXAM: THYROID ULTRASOUND TECHNIQUE: Ultrasound examination of the thyroid gland and adjacent soft tissues was performed. COMPARISON:  12/01/2017 FINDINGS: Parenchymal Echotexture: Moderately heterogeneous Isthmus: 0.4 cm Right lobe: 8.1 x 3.9 x 3.0 cm Left lobe: 5.2 x 1.9 x 1.9 cm _________________________________________________________ Estimated total number of nodules >/= 1 cm: 3 Number of spongiform nodules >/=  2 cm not described below (TR1): 0 Number of mixed cystic and solid nodules >/= 1.5 cm not described below (TR2): 0 _________________________________________________________ Nodule 1: 6.7 x 3.9 x 3.4 cm heterogeneous isoechoic mixed solid cystic region appears to occupy the entire right thyroid lobe. It does not have discrete borders of the nodule and is favored to be a pseudo nodule.  _________________________________________________________ Nodule # 2: Location: Left; mid Maximum size: 1.9 cm; Other 2 dimensions: 1.7 x 1.1 cm Composition: solid/almost completely solid (2) Echogenicity: isoechoic (1) Shape: taller-than-wide (3) Margins: smooth (0) Echogenic foci: none (0) ACR TI-RADS total points: 6. ACR TI-RADS risk category: TR4 (4-6 points). ACR TI-RADS recommendations: **Given size (>/= 1.5 cm) and appearance, fine needle aspiration of this moderately suspicious nodule should be considered based on TI-RADS criteria. _________________________________________________________ Nodule 3: 2.2 x 1.4 x 0.8 cm spongiform nodule in the inferior left thyroid lobe does not meet criteria for imaging surveillance or FNA. IMPRESSION: Nodule 2 (TI-RADS 4), measuring 1.9 cm, located  in the mid left thyroid lobe, meets criteria for FNA. The above is in keeping with the ACR TI-RADS recommendations - J Am Coll Radiol 2017;14:587-595. Electronically Signed   By: Miachel Roux M.D.   On: 05/14/2021 16:13    Cardiac Studies   2D echo 05/14/2021: 1. The mitral valve is normal in structure. Small mobile opacity noted on  anterior and posterior mitral valve leaflets, unable to exclude valve  vegetations vs other etiology/valve cord. Clinical correlation  recommended. Moderate mitral valve  regurgitation. No evidence of mitral stenosis.   2. Left ventricular ejection fraction, by estimation, is 25 to 30%. The  left ventricle has severely decreased function. The left ventricle  demonstrates global hypokinesis. The left ventricular internal cavity size  was moderately dilated. Left  ventricular diastolic parameters are indeterminate.   3. Right ventricular systolic function is moderately reduced. The right  ventricular size is mildly enlarged. There is mildly elevated pulmonary  artery systolic pressure.   4. The aortic valve is normal in structure. Aortic valve regurgitation is  not visualized. No aortic  stenosis is present.   5. There is borderline dilatation of the aortic root, measuring 38 mm.   6. The inferior vena cava is normal in size with greater than 50%  respiratory variability, suggesting right atrial pressure of 3 mmHg. __________   ETT 09/19/2014: There was no ST segment deviation noted during stress.  Patient Profile     66 y.o. male with history of hyperthyroidism treated with PTU and methimazole, multinodular goiter, renal mass, adrenal mass, HTN, HLD and prior tobacco use who is being seen today for the evaluation of atrial flutter with RVR in the context of multifocal bronchopneumonia and thyroid storm at the request of Dr. Lanney Gins.  Assessment & Plan    1. Atrial flutter with 2-1 AV block: -Likely in the setting of the patient's underlying thyroid storm and multifocal bronchopneumonia -Hopefully ventricular rates will improve with treatment of underlying pathologic processes -He remains in atrial flutter with RVR -Currently on propanolol given underlying thyroid storm -CHADS2VASc 2 (HTN, age x 1) -Continue heparin drip -Very low likelihood of maintaining sinus rhythm if DCCV were to be performed given his underlying pneumonia and thyroid storm, would only pursue this if he became hemodynamically unstable -Otherwise, would pursue TEE guided DCCV as his thyroid is better controlled -Unfortunately, options for rate control are limited at this time secondary to relative hypotension precluding escalation of beta-blocker, amiodarone is contraindicated in the setting of his thyroid storm, cannot use digoxin secondary to AKI -Continue to monitor   2.  HFrEF: -Echo this admission with an EF of 25 to 30%,  -Cardiomyopathy possibly tachycardia mediated -Propanolol for now given thyroid storm with recommendation to escalate GDMT as tolerated following improvement in clinical course of thyroid dysfunction -Once his ventricular rates and underlying acute illness improve, would  recommend repeat limited echo to reevaluate LV systolic function -If his cardiomyopathy persists, consider ischemic evaluation -Diuresis as indicated   3.  Mitral valve regurgitation/mobile opacity: -Echo demonstrated a small mobile opacity on the anterior and posterior mitral valve leaflets, unable to exclude valvular vegetation versus alternative etiology/valve cord -Moderate mitral regurgitation was noted -Blood cultures are no growth to date x2 -Consider alternative imaging modality as clinically indicated   4.  Acute hypoxic respiratory distress: -Improving -Multifactorial including multifocal pneumonia, acute HFrEF, and atrial flutter with RVR -Management of atrial flutter and cardiomyopathy as outlined above with ongoing management of his pneumonia per primary service  5.  Thyroid storm: -Management per CCM -Awaiting bed at [facility] for inpatient endocrinology evaluation  6.  Multiorgan failure: -Management per primary service       For questions or updates, please contact [place] Please consult [email or web address] for contact info under Cardiology/STEMI.    Signed, Christell Faith, PA-C [place] Pager: [phone] 05/15/2021, 12:18 PM

## 2021-05-15 NOTE — Progress Notes (Signed)
ANTICOAGULATION CONSULT NOTE  Pharmacy Consult for heparin Indication: atrial fibrillation  No Known Allergies  Patient Measurements: Height: 6\' 1"  (185.4 cm) Weight: 73.1 kg (161 lb 2.5 oz) IBW/kg (Calculated) : 79.9 Heparin Dosing Weight: 72.2 kg  Vital Signs: Temp: 99.3 F (37.4 C) (02/10 0800) Temp Source: Bladder (02/10 0400) BP: 110/94 (02/10 0800) Pulse Rate: 126 (02/10 0800)  Labs: Recent Labs    05/13/21 1211 05/13/21 1424 05/14/21 0112 05/14/21 0456 05/15/21 0048 05/15/21 0333 05/15/21 0338 05/15/21 0715  HGB 13.6  --   --  14.2  --  14.8  --   --   HCT 40.8  --   --  42.8  --  44.2  --   --   PLT 164  --   --  150  --  160  --   --   APTT  --   --  30  --   --   --   --   --   LABPROT  --   --  15.8*  --   --   --   --   --   INR  --   --  1.3*  --   --   --   --   --   HEPARINUNFRC  --   --   --   --  0.44  --   --  0.34  CREATININE 1.13  --   --  1.28*  --   --  2.22*  --   CKTOTAL  --   --  182  --   --   --   --   --   TROPONINIHS 18* 16  --   --   --   --   --   --     Estimated Creatinine Clearance: 34.3 mL/min (A) (by C-G formula based on SCr of 2.22 mg/dL (H)).   Medical History: Past Medical History:  Diagnosis Date   Arthritis    Heart murmur    Hyperlipidemia    Macrocytosis    Thyroid disease    Tobacco abuse   Heparin Dosing Weight: 72.2 kg  Assessment: Patient is a 66 y/o M with medical history including multinodular goiter / hyperthyroidism, BPH, tobacco abuse who is admitted with thyroid storm and respiratory failure in setting of pulmonary edema and multifocal pneumonia. Patient with atrial fibrillation. Pharmacy consulted for management of heparin.  Goal of Therapy:  Heparin level 0.3-0.7 units/ml Monitor platelets by anticoagulation protocol: Yes  Date Time  HL Rate/Comment 2/10 0048 0.44 Therapeutic x1; 850 un/hr 2/10 0715 0.34 Therapeutic x2; 850 un/hr   Baseline Labs: aPTT - 30 INR - 1.3 Hgb - 14.2 Plts -  150>160   Plan:  HL consecutively therapeutic. Considering TEE cardioversion. --Continue heparin infusion at 850 units/hr --Recheck HL daily with AM labs. --CTM CBC daily while on heparin infusion  Lorna Dibble, PharmD, [facility] Clinical Pharmacist 05/15/2021 8:57 AM

## 2021-05-15 NOTE — Progress Notes (Signed)
CRITICAL CARE PROGRESS NOTE    Name: Tyler Cohen MRN: [id number] DOB: July 31, 1955     LOS: 2   SUBJECTIVE FINDINGS & SIGNIFICANT EVENTS    Patient description:  66 y.o male with significant PMH of hypertension, Hyperlipidemia, renal mass, adrenal mass, former smoker, hyperthyroidism treated with PTU and methimazole, and multinodular goiter (US Thyroid 2019) who presented to the ED with chief complaints of progressive shortness of breath since yesterday.   Patient state he was seen at the urgent care for reports of acute dyspnea associated with dry cough, chest tightness, palpitations, nausea and vomiting. Patient was found to be tachycardic to 156 in the clinic with O2 sat 100% on room air and afebrile. He was sent to the ER for further evaluation and management given symptomatology, tachycardia.   ED Course: In the emergency department, the temperature was 37.1C, the heart rate 150 beats/minute, the blood pressure 112/85 mm Hg, the respiratory rate 28 breaths/minute, and the oxygen saturation 98% on 2L. Labs revealed unremarkable CBC, slightly elevated AST/ALT: 65/119 otherwise unremarkable CMP. Thyroid function tests ordered on admission revealed a thyroid-stimulating hormone (TSH) level of <0.01 ?U/mL and a free thyroxine level of 3.49ng/dL, confirming the diagnosis of thyrotoxicosis. The patient was started on 1 gram of propylthiouracil, 20 mg of propranolol, 100 mg of hydrocortisone, 4 grams of cholestyramine, 20 mg IV Lasix as well as empiric antibiotics for possible right basilar pneumonia noted on chest x-ray. Patient remains tachycardic, borderline hypoxic after receiving medications.  He has been accepted to [facility] but no bed will be available for at least the next 12 hours.  Given patient's critical illness, PCCM  team consulted for admission pending transfer.  Lines/tubes : Urethral Catheter Jared, NT Temperature probe 14 Fr. (Active)  Indication for Insertion or Continuance of Catheter Bladder outlet obstruction / other urologic reason 05/15/21 0800  Site Assessment Clean;Intact;Dry 05/15/21 0800  Catheter Maintenance Bag below level of bladder;Seal intact;Bag emptied prior to transport;Catheter secured;Drainage bag/tubing not touching floor;Insertion date on drainage bag;No dependent loops 05/15/21 0800  Collection Container Standard drainage bag 05/15/21 0800  Securement Method Securing device (Describe) 05/15/21 0800  Urinary Catheter Interventions (if applicable) Unclamped 54/62/70 0800  Output (mL) 50 mL 05/15/21 0800    Microbiology/Sepsis markers: Results for orders placed or performed during the hospital encounter of 05/13/21  Resp Panel by RT-PCR (Flu A&B, Covid) Nasopharyngeal Swab     Status: None   Collection Time: 05/13/21 12:25 PM   Specimen: Nasopharyngeal Swab; Nasopharyngeal(NP) swabs in vial transport medium  Result Value Ref Range Status   SARS Coronavirus 2 by RT PCR NEGATIVE NEGATIVE Final    Comment: (NOTE) SARS-CoV-2 target nucleic acids are NOT DETECTED.  The SARS-CoV-2 RNA is generally detectable in upper respiratory specimens during the acute phase of infection. The lowest concentration of SARS-CoV-2 viral copies this assay can detect is 138 copies/mL. A negative result does not preclude SARS-Cov-2 infection and should not be used as the sole basis for treatment or other patient management decisions. A negative result may occur with  improper specimen collection/handling, submission of specimen other than nasopharyngeal swab, presence of viral mutation(s) within the areas targeted by this assay, and inadequate number of viral copies(<138 copies/mL). A negative result must be combined with clinical observations, patient history, and epidemiological information. The  expected result is Negative.  Fact Sheet for Patients:  EntrepreneurPulse.com.au  Fact Sheet for Healthcare Providers:  IncredibleEmployment.be  This test is no t yet approved or cleared by the  [place] Architectural technologist and  has been authorized for detection and/or diagnosis of SARS-CoV-2 by FDA under an Print production planner (EUA). This EUA will remain  in effect (meaning this test can be used) for the duration of the COVID-19 declaration under Section 564(b)(1) of the Act, 21 U.S.C.section 360bbb-3(b)(1), unless the authorization is terminated  or revoked sooner.       Influenza A by PCR NEGATIVE NEGATIVE Final   Influenza B by PCR NEGATIVE NEGATIVE Final    Comment: (NOTE) The Xpert Xpress SARS-CoV-2/FLU/RSV plus assay is intended as an aid in the diagnosis of influenza from Nasopharyngeal swab specimens and should not be used as a sole basis for treatment. Nasal washings and aspirates are unacceptable for Xpert Xpress SARS-CoV-2/FLU/RSV testing.  Fact Sheet for Patients: EntrepreneurPulse.com.au  Fact Sheet for Healthcare Providers: IncredibleEmployment.be  This test is not yet approved or cleared by the [place] FDA and has been authorized for detection and/or diagnosis of SARS-CoV-2 by FDA under an Emergency Use Authorization (EUA). This EUA will remain in effect (meaning this test can be used) for the duration of the COVID-19 declaration under Section 564(b)(1) of the Act, 21 U.S.C. section 360bbb-3(b)(1), unless the authorization is terminated or revoked.  Performed at [facility], [place], [place] [address]   Respiratory (~20 pathogens) panel by PCR     Status: None   Collection Time: 05/13/21 12:25 PM   Specimen: Nasopharyngeal Swab; Respiratory  Result Value Ref Range Status   Adenovirus NOT DETECTED NOT DETECTED Final   Coronavirus 229E NOT DETECTED NOT  DETECTED Final    Comment: (NOTE) The Coronavirus on the Respiratory Panel, DOES NOT test for the novel  Coronavirus (2019 nCoV)    Coronavirus HKU1 NOT DETECTED NOT DETECTED Final   Coronavirus NL63 NOT DETECTED NOT DETECTED Final   Coronavirus OC43 NOT DETECTED NOT DETECTED Final   Metapneumovirus NOT DETECTED NOT DETECTED Final   Rhinovirus / Enterovirus NOT DETECTED NOT DETECTED Final   Influenza A NOT DETECTED NOT DETECTED Final   Influenza B NOT DETECTED NOT DETECTED Final   Parainfluenza Virus 1 NOT DETECTED NOT DETECTED Final   Parainfluenza Virus 2 NOT DETECTED NOT DETECTED Final   Parainfluenza Virus 3 NOT DETECTED NOT DETECTED Final   Parainfluenza Virus 4 NOT DETECTED NOT DETECTED Final   Respiratory Syncytial Virus NOT DETECTED NOT DETECTED Final   Bordetella pertussis NOT DETECTED NOT DETECTED Final   Bordetella Parapertussis NOT DETECTED NOT DETECTED Final   Chlamydophila pneumoniae NOT DETECTED NOT DETECTED Final   Mycoplasma pneumoniae NOT DETECTED NOT DETECTED Final    Comment: Performed at [facility], [place]. [address] [place], [place] [address]  MRSA Next Gen by PCR, Nasal     Status: None   Collection Time: 05/14/21 12:48 AM   Specimen: Nasal Mucosa; Nasal Swab  Result Value Ref Range Status   MRSA by PCR Next Gen NOT DETECTED NOT DETECTED Final    Comment: (NOTE) The GeneXpert MRSA Assay (FDA approved for NASAL specimens only), is one component of a comprehensive MRSA colonization surveillance program. It is not intended to diagnose MRSA infection nor to guide or monitor treatment for MRSA infections. Test performance is not FDA approved in patients less than 4 years old. Performed at [facility], [place], [place] [address]   CULTURE, BLOOD (ROUTINE X 2) w Reflex to ID Panel     Status: None (Preliminary result)   Collection Time: 05/14/21  1:12 AM   Specimen: BLOOD  Result Value Ref Range Status   Specimen Description  BLOOD RIGHT ASSIST CONTROL  Final   Special Requests   Final    BOTTLES DRAWN AEROBIC AND ANAEROBIC Blood Culture adequate volume   Culture   Final    NO GROWTH 1 DAY Performed at [facility], [address] [place], [place] [address]    Report Status PENDING  Incomplete  CULTURE, BLOOD (ROUTINE X 2) w Reflex to ID Panel     Status: None (Preliminary result)   Collection Time: 05/14/21  1:12 AM   Specimen: BLOOD  Result Value Ref Range Status   Specimen Description BLOOD RIGHT HAND  Final   Special Requests   Final    BOTTLES DRAWN AEROBIC AND ANAEROBIC Blood Culture adequate volume   Culture   Final    NO GROWTH 1 DAY Performed at [facility], [address], [place], [place] [address]    Report Status PENDING  Incomplete    Anti-infectives:  Anti-infectives (From admission, onward)    Start     Dose/Rate Route Frequency Ordered Stop   05/14/21 2200  doxycycline (VIBRA-TABS) tablet 100 mg        100 mg Oral Every 12 hours 05/14/21 1403 05/18/21 2159   05/14/21 2100  cefTRIAXone (ROCEPHIN) 2 g in sodium chloride 0.9 % 100 mL IVPB        2 g 200 mL/hr over 30 Minutes Intravenous Every 24 hours 05/14/21 0642     05/14/21 1000  doxycycline (VIBRAMYCIN) 100 mg in sodium chloride 0.9 % 250 mL IVPB  Status:  Discontinued        100 mg 125 mL/hr over 120 Minutes Intravenous Every 12 hours 05/14/21 0642 05/14/21 1403   05/14/21 0000  cefTRIAXone 2 g in sodium chloride 0.9 % 100 mL        2 g Intravenous Every 24 hours 05/14/21 1405     05/14/21 0000  doxycycline (VIBRA-TABS) 100 MG tablet        100 mg Oral Every 12 hours 05/14/21 1405     05/13/21 2030  cefTRIAXone (ROCEPHIN) 2 g in sodium chloride 0.9 % 100 mL IVPB        2 g 200 mL/hr over 30 Minutes Intravenous  Once 05/13/21 2024 05/13/21 2130   05/13/21 2030  doxycycline (VIBRAMYCIN) 100 mg in sodium chloride 0.9 % 250 mL IVPB        100 mg 125 mL/hr over 120 Minutes Intravenous  Once 05/13/21 2024  05/14/21 0040        Consults: Treatment Team:  Minna Merritts, MD   PAST MEDICAL HISTORY   Past Medical History:  Diagnosis Date   Arthritis    Heart murmur    Hyperlipidemia    Macrocytosis    Thyroid disease    Tobacco abuse      SURGICAL HISTORY   Past Surgical History:  Procedure Laterality Date   COLONOSCOPY WITH PROPOFOL N/A 12/01/2015   Procedure: COLONOSCOPY WITH PROPOFOL;  Surgeon: Lucilla Lame, MD;  Location: [facility];  Service: Endoscopy;  Laterality: N/A;   POLYPECTOMY  12/01/2015   Procedure: POLYPECTOMY;  Surgeon: Lucilla Lame, MD;  Location: [facility];  Service: Endoscopy;;     FAMILY HISTORY   Family History  Problem Relation Age of Onset   Hypertension Mother    Diabetes Mother        lost both legs   Kidney disease  Mother    Hypertension Father    Emphysema Father    Sickle cell trait Father    Hypertension Brother    Hyperlipidemia Brother    Sickle cell trait Sister      SOCIAL HISTORY   Social History   Tobacco Use   Smoking status: Former    Packs/day: 0.25    Years: 42.00    Pack years: 10.50    Types: Cigars, Cigarettes   Smokeless tobacco: Never   Tobacco comments:    4 cigars a day   Vaping Use   Vaping Use: Never used  Substance Use Topics   Alcohol use: No   Drug use: Yes    Types: Marijuana    Comment: pt states he smokes every once in a while     MEDICATIONS   Current Medication:  Current Facility-Administered Medications:    acetaminophen (TYLENOL) tablet 650 mg, 650 mg, Oral, Q6H PRN, Darel Hong D, NP, 650 mg at 05/14/21 1006   cefTRIAXone (ROCEPHIN) 2 g in sodium chloride 0.9 % 100 mL IVPB, 2 g, Intravenous, Q24H, Ouma, Bing Neighbors, NP, Stopped at 05/14/21 2158   Chlorhexidine Gluconate Cloth 2 % PADS 6 each, 6 each, Topical, Q0600, Lang Snow, NP, 6 each at 05/15/21 1027   cholestyramine (QUESTRAN) packet 4 g, 4 g, Oral, TID, Duffy Bruce, MD, 4 g at  05/15/21 1023   docusate sodium (COLACE) capsule 100 mg, 100 mg, Oral, BID PRN, Lang Snow, NP   doxycycline (VIBRA-TABS) tablet 100 mg, 100 mg, Oral, Q12H, Benita Gutter, RPH, 100 mg at 05/15/21 1023   feeding supplement (ENSURE ENLIVE / ENSURE PLUS) liquid 237 mL, 237 mL, Oral, TID BM, Obe Ahlers, MD, 237 mL at 05/14/21 1508   heparin ADULT infusion 100 units/mL (25000 units/275mL), 850 Units/hr, Intravenous, Continuous, Ellington, Abby K, RPH, Last Rate: 8.5 mL/hr at 05/15/21 0800, 850 Units/hr at 05/15/21 0800   methylPREDNISolone sodium succinate (SOLU-MEDROL) 40 mg/mL injection 40 mg, 40 mg, Intravenous, BID, Lanney Gins, Kritika Stukes, MD, 40 mg at 05/15/21 1023   morphine (PF) 4 MG/ML injection 4 mg, 4 mg, Intravenous, Q3H PRN, Merlyn Lot, MD, 4 mg at 05/15/21 0204   multivitamin with minerals tablet 1 tablet, 1 tablet, Oral, Daily, Ottie Glazier, MD, 1 tablet at 05/15/21 1023   ondansetron (ZOFRAN) injection 4 mg, 4 mg, Intravenous, Once, Ouma, Bing Neighbors, NP   ondansetron [facility]) injection 4 mg, 4 mg, Intravenous, Q6H, Rebbeca Sheperd, MD, 4 mg at 05/15/21 0553   polyethylene glycol (MIRALAX / GLYCOLAX) packet 17 g, 17 g, Oral, Daily PRN, Stark Klein, Bing Neighbors, NP   promethazine (PHENERGAN) 12.5 mg in sodium chloride 0.9 % 50 mL IVPB, 12.5 mg, Intravenous, Q6H PRN, Darel Hong D, NP, Stopped at 05/15/21 0217   propranolol (INDERAL) tablet 20 mg, 20 mg, Oral, Q6H, Ouma, Bing Neighbors, NP, 20 mg at 05/15/21 0553   propylthiouracil (PTU) tablet 200 mg, 200 mg, Oral, Q4H, Duffy Bruce, MD, 200 mg at 05/15/21 0805    ALLERGIES   Patient has no known allergies.    REVIEW OF SYSTEMS     10 point ros negative except nausea and right chest discomfort  PHYSICAL EXAMINATION   Vital Signs: Temp:  [98.4 F (36.9 C)-100.6 F (38.1 C)] 99.3 F (37.4 C) (02/10 0900) Pulse Rate:  [125-139] 126 (02/10 0800) Resp:  [9-25] 16 (02/10 0900) BP:  (92-111)/(59-95) 94/82 (02/10 0900) SpO2:  [92 %-100 %] 99 % (02/10 0800) Weight:  [73.1 kg] 73.1  kg (02/10 0500)  GENERAL:mild distress age appropriate HEAD: Normocephalic, atraumatic.  EYES: Pupils equal, round, reactive to light.  No scleral icterus.  MOUTH: Moist mucosal membrane. NECK: Supple. No thyromegaly. No nodules. No JVD.  PULMONARY: CTAB CARDIOVASCULAR: S1 and S2. Regular rate and rhythm. No murmurs, rubs, or gallops.  GASTROINTESTINAL: Soft, nontender, non-distended. No masses. Positive bowel sounds. No hepatosplenomegaly.  MUSCULOSKELETAL: No swelling, clubbing, or edema.  NEUROLOGIC: Mild distress due to acute illness SKIN:intact,warm,dry   PERTINENT DATA     Infusions:  cefTRIAXone (ROCEPHIN)  IV Stopped (05/14/21 2158)   heparin 850 Units/hr (05/15/21 0800)   promethazine (PHENERGAN) injection (IM or IVPB) Stopped (05/15/21 0217)   Scheduled Medications:  Chlorhexidine Gluconate Cloth  6 each Topical Q0600   cholestyramine  4 g Oral TID   doxycycline  100 mg Oral Q12H   feeding supplement  237 mL Oral TID BM   methylPREDNISolone (SOLU-MEDROL) injection  40 mg Intravenous BID   multivitamin with minerals  1 tablet Oral Daily   ondansetron (ZOFRAN) IV  4 mg Intravenous Once   ondansetron (ZOFRAN) IV  4 mg Intravenous Q6H   propranolol  20 mg Oral Q6H   propylthiouracil  200 mg Oral Q4H   PRN Medications: acetaminophen, docusate sodium, morphine injection, polyethylene glycol, promethazine (PHENERGAN) injection (IM or IVPB) Hemodynamic parameters:   Intake/Output: 02/09 0701 - 02/10 0700 In: 1017 [P.O.:720; I.V.:147; IV Piggyback:150] Out: 2280 [Urine:780; Emesis/NG output:1500]  Ventilator  Settings:     LAB RESULTS:  Basic Metabolic Panel: Recent Labs  Lab 05/13/21 1211 05/14/21 0112 05/14/21 0456 05/15/21 0333 05/15/21 0338  NA 135  --  133*  --  131*  K 4.2  --  4.8  --  5.1  CL 107  --  103  --  101  CO2 22  --  22  --  19*  GLUCOSE  130*  --  113*  --  157*  BUN 20  --  27*  --  49*  CREATININE 1.13  --  1.28*  --  2.22*  CALCIUM 8.9  --  8.6*  --  8.6*  MG  --  1.8 2.4 2.3  --   PHOS  --   --  4.6 7.3*  --    Liver Function Tests: Recent Labs  Lab 05/13/21 1445 05/15/21 0338  AST 65* 128*  ALT 119* 203*  ALKPHOS 65 72  BILITOT 1.1 0.8  PROT 6.6 6.5  ALBUMIN 3.6 3.5   No results for input(s): LIPASE, AMYLASE in the last 168 hours. No results for input(s): AMMONIA in the last 168 hours. CBC: Recent Labs  Lab 05/13/21 1211 05/14/21 0456 05/15/21 0333  WBC 7.7 9.2 10.2  HGB 13.6 14.2 14.8  HCT 40.8 42.8 44.2  MCV 94.2 96.6 94.8  PLT 164 150 160   Cardiac Enzymes: Recent Labs  Lab 05/14/21 0112  CKTOTAL 182   BNP: Invalid input(s): POCBNP CBG: Recent Labs  Lab 05/14/21 0047  GLUCAP 117*       IMAGING RESULTS:  Imaging: DG Abd 1 View  Result Date: 05/14/2021 CLINICAL DATA:  Vomiting, x2. EXAM: ABDOMEN - 1 VIEW COMPARISON:  None. FINDINGS: The bowel gas pattern is normal. Excreted contrast in the right collecting system as well as in the urinary bladder which is collapsed about the [facility] catheter. No acute osseous abnormality. IMPRESSION: Nonobstructive bowel gas pattern. Electronically Signed   By: Keane Police D.O.   On: 05/14/2021 17:56   CT Angio  Chest Pulmonary Embolism (PE) W or WO Contrast  Result Date: 05/14/2021 CLINICAL DATA:  Pulmonary embolism (PE) suspected, high prob EXAM: CT ANGIOGRAPHY CHEST WITH CONTRAST TECHNIQUE: Multidetector CT imaging of the chest was performed using the standard protocol during bolus administration of intravenous contrast. Multiplanar CT image reconstructions and MIPs were obtained to evaluate the vascular anatomy. RADIATION DOSE REDUCTION: This exam was performed according to the departmental dose-optimization program which includes automated exposure control, adjustment of the mA and/or kV according to patient size and/or use of iterative  reconstruction technique. CONTRAST:  100 cc Omnipaque 350 COMPARISON:  None. FINDINGS: Cardiovascular: There is adequate opacification of the pulmonary arterial tree. No intraluminal filling defect identified to suggest acute pulmonary embolism. The central pulmonary arteries are enlarged in keeping with changes of pulmonary arterial hypertension. No significant coronary artery calcification. Global cardiac size is mildly enlarged. There is significant reflux of contrast into the hepatic venous system in keeping with at least some degree of right heart failure. No pericardial effusion. The thoracic aorta is unremarkable. Mediastinum/Nodes: The right thyroid lobe is enlarged with a 2.5 cm nodule again visualized, stable from prior examination and better assessed on sonogram of 12/01/2017. Shotty mediastinal adenopathy may be reactive in nature. No frankly pathologic thoracic adenopathy identified. The esophagus is unremarkable. Lungs/Pleura: Multifocal pulmonary infiltrate is seen throughout the right lung, likely infectious, with associated asymmetric right lung bronchial wall thickening likely reflecting airway inflammation. Small right pleural effusion is present. Smooth interlobular septal thickening noted throughout the right lung is in keeping with mild asymmetric pulmonary edema. No pneumothorax. Mild paraseptal emphysema. No central obstructing lesion. Upper Abdomen: No acute abnormality. Musculoskeletal: No acute bone abnormality. No lytic or blastic bone lesion. Review of the MIP images confirms the above findings. IMPRESSION: No pulmonary embolism. Mild global cardiomegaly. Morphologic changes in keeping with pulmonary arterial hypertension. Marked reflux of contrast into the hepatic venous system in keeping with at least some degree of right heart failure. Multifocal pulmonary infiltrate throughout the right lung and associated asymmetric bronchial wall thickening in keeping with multifocal  bronchopneumonia. Associated small right pleural effusion. Superimposed asymmetric pulmonary edema throughout the right lung Emphysema (ICD10-J43.9). Electronically Signed   By: Fidela Salisbury M.D.   On: 05/14/2021 02:30   [place] CHEST (PLEURAL EFFUSION)  Result Date: 05/14/2021 CLINICAL DATA:  Pleural effusion EXAM: CHEST ULTRASOUND COMPARISON:  None. FINDINGS: Limited sonographic exam of the right chest was performed. Images demonstrate a small pleural effusion, not amenable for safe image guided thoracentesis. No thoracentesis performed. IMPRESSION: Small right pleural effusion.  No thoracentesis performed. Electronically Signed   By: Albin Felling M.D.   On: 05/14/2021 15:57   DG Chest Portable 1 View  Result Date: 05/13/2021 CLINICAL DATA:  Short of breath EXAM: PORTABLE CHEST 1 VIEW COMPARISON:  05/13/2021 FINDINGS: Mild cardiac enlargement.  No definite vascular congestion Asymmetric airspace disease on the right with mild progression. Mild left lower lobe airspace disease. No effusion. IMPRESSION: Progression of right lower lobe airspace disease.  Favor pneumonia. Electronically Signed   By: Franchot Gallo M.D.   On: 05/13/2021 19:42   DG Chest Portable 1 View  Result Date: 05/13/2021 CLINICAL DATA:  Shortness of breath.  Evaluate for infiltrate. EXAM: PORTABLE CHEST 1 VIEW COMPARISON:  Chest two views 04/24/2016, chest CT 03/07/2019 FINDINGS: Cardiac silhouette is at the upper limits of normal size, noting AP technique. Mediastinal contours are within normal limits with mild calcification within aortic arch. Cystic changes are seen at the right  lung apex, as on prior CT. New mild-to-moderate diffuse bilateral interstitial thickening. No pleural effusion or pneumothorax. No acute skeletal abnormality. IMPRESSION: New mild-to-moderate interstitial thickening may represent mild to moderate interstitial pulmonary edema. Progressive chronic scarring can have a similar appearance. Electronically Signed    By: Yvonne Kendall M.D.   On: 05/13/2021 13:11   ECHOCARDIOGRAM COMPLETE  Result Date: 05/14/2021    ECHOCARDIOGRAM REPORT   Patient Name:   JAHMAR MCKELVY Date of Exam: 05/14/2021 Medical Rec #:  [id number]       Height:       73.0 in Accession #:    [id number]      Weight:       159.2 lb Date of Birth:  02-11-1956       BSA:          1.953 m Patient Age:    39 years        BP:           105/81 mmHg Patient Gender: M               HR:           131 bpm. Exam Location:  [facility] Procedure: 2D Echo, Color Doppler and Cardiac Doppler Indications:     I50.31 congestive heart failure-Acute Diastolic  History:         Patient has no prior history of Echocardiogram examinations.                  Signs/Symptoms:Murmur; Risk Factors:Hypertension, Dyslipidemia                  and Former Smoker.  Sonographer:     Charmayne Sheer Referring Phys:  [place] Diagnosing Phys: Ida Rogue MD IMPRESSIONS  1. The mitral valve is normal in structure. Small mobile opacity noted on anterior and posterior mitral valve leaflets, unable to exclude valve vegetations vs other etiology/valve cord. Clinical correlation recommended. Moderate mitral valve regurgitation. No evidence of mitral stenosis.  2. Left ventricular ejection fraction, by estimation, is 25 to 30%. The left ventricle has severely decreased function. The left ventricle demonstrates global hypokinesis. The left ventricular internal cavity size was moderately dilated. Left ventricular diastolic parameters are indeterminate.  3. Right ventricular systolic function is moderately reduced. The right ventricular size is mildly enlarged. There is mildly elevated pulmonary artery systolic pressure.  4. The aortic valve is normal in structure. Aortic valve regurgitation is not visualized. No aortic stenosis is present.  5. There is borderline dilatation of the aortic root, measuring 38 mm.  6. The inferior vena cava is normal in size with greater than 50% respiratory  variability, suggesting right atrial pressure of 3 mmHg. FINDINGS  Left Ventricle: Left ventricular ejection fraction, by estimation, is 25 to 30%. The left ventricle has severely decreased function. The left ventricle demonstrates global hypokinesis. The left ventricular internal cavity size was moderately dilated. There is no left ventricular hypertrophy. Left ventricular diastolic parameters are indeterminate. Right Ventricle: The right ventricular size is mildly enlarged. No increase in right ventricular wall thickness. Right ventricular systolic function is moderately reduced. There is mildly elevated pulmonary artery systolic pressure. The tricuspid regurgitant velocity is 2.95 m/s, and with an assumed right atrial pressure of 5 mmHg, the estimated right ventricular systolic pressure is 58.0 mmHg. Left Atrium: Left atrial size was normal in size. Right Atrium: Right atrial size was normal in size. Pericardium: There is no evidence of pericardial effusion. Mitral Valve:  The mitral valve is normal in structure. There is mild thickening of the mitral valve leaflet(s). There is mild calcification of the mitral valve leaflet(s). Moderate mitral valve regurgitation. No evidence of mitral valve stenosis. MV peak  gradient, 7.4 mmHg. The mean mitral valve gradient is 4.0 mmHg. Tricuspid Valve: The tricuspid valve is normal in structure. Tricuspid valve regurgitation is mild . No evidence of tricuspid stenosis. Aortic Valve: The aortic valve is normal in structure. Aortic valve regurgitation is not visualized. No aortic stenosis is present. Aortic valve mean gradient measures 2.0 mmHg. Aortic valve peak gradient measures 2.9 mmHg. Aortic valve area, by VTI measures 1.79 cm. Pulmonic Valve: The pulmonic valve was normal in structure. Pulmonic valve regurgitation is not visualized. No evidence of pulmonic stenosis. Aorta: The aortic root is normal in size and structure. There is borderline dilatation of the aortic root,  measuring 38 mm. Venous: The inferior vena cava is normal in size with greater than 50% respiratory variability, suggesting right atrial pressure of 3 mmHg. IAS/Shunts: No atrial level shunt detected by color flow Doppler.  LEFT VENTRICLE PLAX 2D LVIDd:         6.13 cm      Diastology LVIDs:         5.31 cm      LV e' medial:    9.57 cm/s LV PW:         1.01 cm      LV E/e' medial:  11.7 LV IVS:        0.73 cm      LV e' lateral:   15.20 cm/s LVOT diam:     1.90 cm      LV E/e' lateral: 7.4 LV SV:         23 LV SV Index:   12 LVOT Area:     2.84 cm  LV Volumes (MOD) LV vol d, MOD A2C: 157.0 ml LV vol d, MOD A4C: 153.0 ml LV vol s, MOD A2C: 93.7 ml LV vol s, MOD A4C: 92.0 ml LV SV MOD A2C:     63.3 ml LV SV MOD A4C:     153.0 ml LV SV MOD BP:      61.5 ml RIGHT VENTRICLE RV Basal diam:  4.50 cm RV S prime:     10.40 cm/s LEFT ATRIUM             Index        RIGHT ATRIUM           Index LA diam:        4.40 cm 2.25 cm/m   RA Area:     17.90 cm LA Vol (A2C):   89.6 ml 45.89 ml/m  RA Volume:   55.70 ml  28.53 ml/m LA Vol (A4C):   43.8 ml 22.43 ml/m LA Biplane Vol: 62.5 ml 32.01 ml/m  AORTIC VALVE                    PULMONIC VALVE AV Area (Vmax):    2.12 cm     PV Vmax:       0.60 m/s AV Area (Vmean):   2.14 cm     PV Vmean:      36.400 cm/s AV Area (VTI):     1.79 cm     PV VTI:        0.059 m AV Vmax:           85.40 cm/s   PV Peak grad:  1.5 mmHg AV Vmean:          57.800 cm/s  PV Mean grad:  1.0 mmHg AV VTI:            0.127 m AV Peak Grad:      2.9 mmHg AV Mean Grad:      2.0 mmHg LVOT Vmax:         64.00 cm/s LVOT Vmean:        43.600 cm/s LVOT VTI:          0.080 m LVOT/AV VTI ratio: 0.63  AORTA Ao Root diam: 3.80 cm MITRAL VALVE                TRICUSPID VALVE MV Area (PHT): 7.44 cm     TR Peak grad:   34.8 mmHg MV Area VTI:   1.53 cm     TR Vmax:        295.00 cm/s MV Peak grad:  7.4 mmHg MV Mean grad:  4.0 mmHg     SHUNTS MV Vmax:       1.36 m/s     Systemic VTI:  0.08 m MV Vmean:      88.0 cm/s     Systemic Diam: 1.90 cm MV Decel Time: 102 msec MR Peak grad: 107.3 mmHg MR Vmax:      518.00 cm/s MV E velocity: 112.33 cm/s Ida Rogue MD Electronically signed by Ida Rogue MD Signature Date/Time: 05/14/2021/2:38:11 PM    Final    US THYROID  Result Date: 05/14/2021 CLINICAL DATA:  Thyroid adenoma Thyroid storm EXAM: THYROID ULTRASOUND TECHNIQUE: Ultrasound examination of the thyroid gland and adjacent soft tissues was performed. COMPARISON:  12/01/2017 FINDINGS: Parenchymal Echotexture: Moderately heterogeneous Isthmus: 0.4 cm Right lobe: 8.1 x 3.9 x 3.0 cm Left lobe: 5.2 x 1.9 x 1.9 cm _________________________________________________________ Estimated total number of nodules >/= 1 cm: 3 Number of spongiform nodules >/=  2 cm not described below (TR1): 0 Number of mixed cystic and solid nodules >/= 1.5 cm not described below (TR2): 0 _________________________________________________________ Nodule 1: 6.7 x 3.9 x 3.4 cm heterogeneous isoechoic mixed solid cystic region appears to occupy the entire right thyroid lobe. It does not have discrete borders of the nodule and is favored to be a pseudo nodule. _________________________________________________________ Nodule # 2: Location: Left; mid Maximum size: 1.9 cm; Other 2 dimensions: 1.7 x 1.1 cm Composition: solid/almost completely solid (2) Echogenicity: isoechoic (1) Shape: taller-than-wide (3) Margins: smooth (0) Echogenic foci: none (0) ACR TI-RADS total points: 6. ACR TI-RADS risk category: TR4 (4-6 points). ACR TI-RADS recommendations: **Given size (>/= 1.5 cm) and appearance, fine needle aspiration of this moderately suspicious nodule should be considered based on TI-RADS criteria. _________________________________________________________ Nodule 3: 2.2 x 1.4 x 0.8 cm spongiform nodule in the inferior left thyroid lobe does not meet criteria for imaging surveillance or FNA. IMPRESSION: Nodule 2 (TI-RADS 4), measuring 1.9 cm, located in the mid left  thyroid lobe, meets criteria for FNA. The above is in keeping with the ACR TI-RADS recommendations - J Am Coll Radiol 2017;14:587-595. Electronically Signed   By: Miachel Roux M.D.   On: 05/14/2021 16:13   @PROBHOSP @ DG Abd 1 View  Result Date: 05/14/2021 CLINICAL DATA:  Vomiting, x2. EXAM: ABDOMEN - 1 VIEW COMPARISON:  None. FINDINGS: The bowel gas pattern is normal. Excreted contrast in the right collecting system as well as in the urinary bladder which is collapsed about the [facility] catheter. No acute osseous abnormality. IMPRESSION: Nonobstructive bowel  gas pattern. Electronically Signed   By: Keane Police D.O.   On: 05/14/2021 17:56   [place] CHEST (PLEURAL EFFUSION)  Result Date: 05/14/2021 CLINICAL DATA:  Pleural effusion EXAM: CHEST ULTRASOUND COMPARISON:  None. FINDINGS: Limited sonographic exam of the right chest was performed. Images demonstrate a small pleural effusion, not amenable for safe image guided thoracentesis. No thoracentesis performed. IMPRESSION: Small right pleural effusion.  No thoracentesis performed. Electronically Signed   By: Albin Felling M.D.   On: 05/14/2021 15:57   US THYROID  Result Date: 05/14/2021 CLINICAL DATA:  Thyroid adenoma Thyroid storm EXAM: THYROID ULTRASOUND TECHNIQUE: Ultrasound examination of the thyroid gland and adjacent soft tissues was performed. COMPARISON:  12/01/2017 FINDINGS: Parenchymal Echotexture: Moderately heterogeneous Isthmus: 0.4 cm Right lobe: 8.1 x 3.9 x 3.0 cm Left lobe: 5.2 x 1.9 x 1.9 cm _________________________________________________________ Estimated total number of nodules >/= 1 cm: 3 Number of spongiform nodules >/=  2 cm not described below (TR1): 0 Number of mixed cystic and solid nodules >/= 1.5 cm not described below (TR2): 0 _________________________________________________________ Nodule 1: 6.7 x 3.9 x 3.4 cm heterogeneous isoechoic mixed solid cystic region appears to occupy the entire right thyroid lobe. It does not have discrete  borders of the nodule and is favored to be a pseudo nodule. _________________________________________________________ Nodule # 2: Location: Left; mid Maximum size: 1.9 cm; Other 2 dimensions: 1.7 x 1.1 cm Composition: solid/almost completely solid (2) Echogenicity: isoechoic (1) Shape: taller-than-wide (3) Margins: smooth (0) Echogenic foci: none (0) ACR TI-RADS total points: 6. ACR TI-RADS risk category: TR4 (4-6 points). ACR TI-RADS recommendations: **Given size (>/= 1.5 cm) and appearance, fine needle aspiration of this moderately suspicious nodule should be considered based on TI-RADS criteria. _________________________________________________________ Nodule 3: 2.2 x 1.4 x 0.8 cm spongiform nodule in the inferior left thyroid lobe does not meet criteria for imaging surveillance or FNA. IMPRESSION: Nodule 2 (TI-RADS 4), measuring 1.9 cm, located in the mid left thyroid lobe, meets criteria for FNA. The above is in keeping with the ACR TI-RADS recommendations - J Am Coll Radiol 2017;14:587-595. Electronically Signed   By: Miachel Roux M.D.   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 Monitor for worsening cardiogenic pulmonary edema/shock -Continue Stress dose 50 mg IV Q6hr following loading dose -Start Cholestyramine 4 grams orally q6hrs. -Blood cultures & infectious workup as indicated. -Monitor I&O's / urinary output -Follow BMP+ mag. Replace electrolytes as indicated -Avoid aspirin or NSAIDS  -Obtain US Thyroid -Endocrinology not available inpatient, Patient has been accepted to [facility] hospital by Dr. Illene Labrador     Acute Hypoxic Respiratory Failure secondary to Pulmonary Edema and  Multifocal Pneumonia -Supplemental O2 as needed to maintain O2 saturations > 92% -High risk for intubation -Follow intermittent ABG and chest x-ray as needed -Obtain CTA Chest -F/u blood and cultures, trend Lactic/ PCT -Monitor WBC/ fever curve -Start Empiric antibiotics with Ceftriaxone and Doxy -Gentle IVF hydration as needed -Pressors for MAP goal >65 -Encourage smoking cessation   Acute Decompensated CHF likely high output in the setting of Thyroid storm Atrial Flutter/SVT Hx: Hypertension, HLD  -Continuous cardiac monitoring -Serial EKG -Maintain MAP greater than 65 -Rate control with Propranolol as above -PRN Lasix as BP and renal  function permits -Obtain 2D Echocardiogram   Elevated LFTs likely in the setting of above Mild elevation in AST, ALT, LDH, bilirubin, ALP -Trend CMP -Avoid Hepatotoxins -Transition PTU to Methimazole if worsening Liver enzymes     Best practice:  Diet:  Oral Pain/Anxiety/Delirium protocol (if indicated): No VAP protocol (if indicated): Not indicated DVT prophylaxis: LMWH GI prophylaxis: PPI Glucose control:  SSI Yes Central venous access:  N/A Arterial line:  N/A Foley:  Yes, and it is still needed Mobility:  bed rest  PT consulted: N/A Last date of multidisciplinary goals of care discussion [2/8] Code Status:  full code Disposition: ICU  GI/Nutrition GI PROPHYLAXIS as indicated DIET-->TF's as tolerated Constipation protocol as indicated  ENDO - ICU hypoglycemic\Hyperglycemia protocol -check FSBS per protocol   ELECTROLYTES -follow labs as needed -replace as needed -pharmacy consultation   DVT/GI PRX ordered -SCDs  TRANSFUSIONS AS NEEDED MONITOR FSBS ASSESS the need for LABS as needed  Critical care provider statement:   Total critical care time: 33 minutes   Performed by: Lanney Gins MD   Critical care time was exclusive of separately billable procedures and treating other patients.   Critical care was  necessary to treat or prevent imminent or life-threatening deterioration.   Critical care was time spent personally by me on the following activities: development of treatment plan with patient and/or surrogate as well as nursing, discussions with consultants, evaluation of patient's response to treatment, examination of patient, obtaining history from patient or surrogate, ordering and performing treatments and interventions, ordering and review of laboratory studies, ordering and review of radiographic studies, pulse oximetry and re-evaluation of patient's condition.    Ottie Glazier, M.D.  Pulmonary & Critical Care Medicine     This document was prepared using Dragon voice recognition software and may include unintentional dictation errors.    Ottie Glazier, M.D.  Division of [place]

## 2021-05-15 NOTE — Progress Notes (Signed)
Neuro: Aox4 Resp: Remains on 7L HFNC. Desaturation with significant movement in bed. Cardio: A flutter rate in 120s throughout day. Soft BP 83/77 Map 78. Aleskerov notified via secure chat and new order of LR at 38mL/hr placed. This RN started LR in right forearm  GI/GU: Active bowel sounds. Only one complaint of nausea this morning around 10 am. Patient able to tolerate crackers then few bites of sandwich at lunch. 25% of dinner eaten. No vomiting today.  Skin: Dry, feet flaky and dry  Psych: Slightly withdrawn but sociable once you ask about wife and family.  Events: [facility] called and given patient update by this RN today.

## 2021-05-15 NOTE — Progress Notes (Signed)
Received call that pt has a bed at [place], [place]. [facility] called. They said that they will not be able to transport pt until after shift change tomorrow. If anything changes they will call back.

## 2021-05-16 DIAGNOSIS — R7401 Elevation of levels of liver transaminase levels: Secondary | ICD-10-CM | POA: Diagnosis not present

## 2021-05-16 DIAGNOSIS — E0521 Thyrotoxicosis with toxic multinodular goiter with thyrotoxic crisis or storm: Secondary | ICD-10-CM | POA: Diagnosis not present

## 2021-05-16 DIAGNOSIS — I4819 Other persistent atrial fibrillation: Secondary | ICD-10-CM | POA: Diagnosis not present

## 2021-05-16 DIAGNOSIS — I5021 Acute systolic (congestive) heart failure: Secondary | ICD-10-CM | POA: Diagnosis not present

## 2021-05-16 DIAGNOSIS — I429 Cardiomyopathy, unspecified: Secondary | ICD-10-CM | POA: Diagnosis not present

## 2021-05-16 DIAGNOSIS — E871 Hypo-osmolality and hyponatremia: Secondary | ICD-10-CM | POA: Diagnosis not present

## 2021-05-16 DIAGNOSIS — Z87891 Personal history of nicotine dependence: Secondary | ICD-10-CM | POA: Diagnosis not present

## 2021-05-16 DIAGNOSIS — J9601 Acute respiratory failure with hypoxia: Secondary | ICD-10-CM | POA: Diagnosis not present

## 2021-05-16 DIAGNOSIS — Z5900 Homelessness unspecified: Secondary | ICD-10-CM | POA: Diagnosis not present

## 2021-05-16 DIAGNOSIS — R0689 Other abnormalities of breathing: Secondary | ICD-10-CM | POA: Diagnosis not present

## 2021-05-16 DIAGNOSIS — E0520 Thyrotoxicosis with toxic multinodular goiter without thyrotoxic crisis or storm: Secondary | ICD-10-CM | POA: Diagnosis not present

## 2021-05-16 DIAGNOSIS — J189 Pneumonia, unspecified organism: Secondary | ICD-10-CM | POA: Diagnosis not present

## 2021-05-16 DIAGNOSIS — I5020 Unspecified systolic (congestive) heart failure: Secondary | ICD-10-CM | POA: Diagnosis not present

## 2021-05-16 DIAGNOSIS — R570 Cardiogenic shock: Secondary | ICD-10-CM | POA: Diagnosis not present

## 2021-05-16 DIAGNOSIS — K760 Fatty (change of) liver, not elsewhere classified: Secondary | ICD-10-CM | POA: Diagnosis not present

## 2021-05-16 DIAGNOSIS — I110 Hypertensive heart disease with heart failure: Secondary | ICD-10-CM | POA: Diagnosis not present

## 2021-05-16 DIAGNOSIS — Z9114 Patient's other noncompliance with medication regimen: Secondary | ICD-10-CM | POA: Diagnosis not present

## 2021-05-16 DIAGNOSIS — I480 Paroxysmal atrial fibrillation: Secondary | ICD-10-CM | POA: Diagnosis not present

## 2021-05-16 DIAGNOSIS — J810 Acute pulmonary edema: Secondary | ICD-10-CM | POA: Diagnosis not present

## 2021-05-16 DIAGNOSIS — N179 Acute kidney failure, unspecified: Secondary | ICD-10-CM | POA: Diagnosis not present

## 2021-05-16 DIAGNOSIS — I517 Cardiomegaly: Secondary | ICD-10-CM | POA: Diagnosis not present

## 2021-05-16 DIAGNOSIS — J181 Lobar pneumonia, unspecified organism: Secondary | ICD-10-CM | POA: Diagnosis not present

## 2021-05-16 DIAGNOSIS — I10 Essential (primary) hypertension: Secondary | ICD-10-CM | POA: Diagnosis not present

## 2021-05-16 DIAGNOSIS — Z8679 Personal history of other diseases of the circulatory system: Secondary | ICD-10-CM | POA: Diagnosis not present

## 2021-05-16 DIAGNOSIS — E785 Hyperlipidemia, unspecified: Secondary | ICD-10-CM | POA: Diagnosis not present

## 2021-05-16 DIAGNOSIS — I4811 Longstanding persistent atrial fibrillation: Secondary | ICD-10-CM | POA: Diagnosis not present

## 2021-05-16 DIAGNOSIS — E162 Hypoglycemia, unspecified: Secondary | ICD-10-CM | POA: Diagnosis not present

## 2021-05-16 DIAGNOSIS — I509 Heart failure, unspecified: Secondary | ICD-10-CM | POA: Diagnosis not present

## 2021-05-16 DIAGNOSIS — E0500 Thyrotoxicosis with diffuse goiter without thyrotoxic crisis or storm: Secondary | ICD-10-CM | POA: Diagnosis not present

## 2021-05-16 DIAGNOSIS — N2889 Other specified disorders of kidney and ureter: Secondary | ICD-10-CM | POA: Diagnosis not present

## 2021-05-16 DIAGNOSIS — I959 Hypotension, unspecified: Secondary | ICD-10-CM | POA: Diagnosis not present

## 2021-05-16 DIAGNOSIS — I484 Atypical atrial flutter: Secondary | ICD-10-CM | POA: Diagnosis not present

## 2021-05-16 DIAGNOSIS — D684 Acquired coagulation factor deficiency: Secondary | ICD-10-CM | POA: Diagnosis not present

## 2021-05-16 DIAGNOSIS — E875 Hyperkalemia: Secondary | ICD-10-CM | POA: Diagnosis not present

## 2021-05-16 DIAGNOSIS — E0590 Thyrotoxicosis, unspecified without thyrotoxic crisis or storm: Secondary | ICD-10-CM | POA: Diagnosis not present

## 2021-05-16 DIAGNOSIS — E7849 Other hyperlipidemia: Secondary | ICD-10-CM | POA: Diagnosis not present

## 2021-05-16 DIAGNOSIS — E0541 Thyrotoxicosis factitia with thyrotoxic crisis or storm: Secondary | ICD-10-CM | POA: Diagnosis not present

## 2021-05-16 DIAGNOSIS — I483 Typical atrial flutter: Secondary | ICD-10-CM | POA: Diagnosis not present

## 2021-05-16 DIAGNOSIS — E0501 Thyrotoxicosis with diffuse goiter with thyrotoxic crisis or storm: Secondary | ICD-10-CM | POA: Diagnosis not present

## 2021-05-16 DIAGNOSIS — E042 Nontoxic multinodular goiter: Secondary | ICD-10-CM | POA: Diagnosis not present

## 2021-05-16 DIAGNOSIS — I4892 Unspecified atrial flutter: Secondary | ICD-10-CM | POA: Diagnosis not present

## 2021-05-16 DIAGNOSIS — I4891 Unspecified atrial fibrillation: Secondary | ICD-10-CM | POA: Diagnosis not present

## 2021-05-16 DIAGNOSIS — R918 Other nonspecific abnormal finding of lung field: Secondary | ICD-10-CM | POA: Diagnosis not present

## 2021-05-16 DIAGNOSIS — E46 Unspecified protein-calorie malnutrition: Secondary | ICD-10-CM | POA: Diagnosis not present

## 2021-05-16 DIAGNOSIS — D689 Coagulation defect, unspecified: Secondary | ICD-10-CM | POA: Diagnosis not present

## 2021-05-16 LAB — FUNGITELL, SERUM: Fungitell Result: 35 pg/mL (ref <80)

## 2021-05-16 NOTE — Progress Notes (Signed)
0200- Report called to [place], Therapist, sports at [place], [place]. All questions answered.  26- [facility] arrived, Pt transferred to their equipment and stretcher, report given to them, and pt left with them.

## 2021-05-18 HISTORY — PX: TEE WITH CARDIOVERSION: SHX5442

## 2021-05-19 LAB — CULTURE, BLOOD (ROUTINE X 2)
Culture: NO GROWTH
Culture: NO GROWTH
Special Requests: ADEQUATE
Special Requests: ADEQUATE

## 2021-05-20 LAB — BLOOD GAS, ARTERIAL
Acid-base deficit: 5.1 mmol/L — ABNORMAL HIGH (ref 0.0–2.0)
Bicarbonate: 19.9 mmol/L — ABNORMAL LOW (ref 20.0–28.0)
O2 Saturation: 80.2 %
Patient temperature: 37
pCO2 arterial: 36 mmHg (ref 32.0–48.0)
pH, Arterial: 7.35 (ref 7.350–7.450)
pO2, Arterial: 47 mmHg — ABNORMAL LOW (ref 83.0–108.0)

## 2021-05-21 ENCOUNTER — Ambulatory Visit: Payer: Medicare HMO | Admitting: Urology

## 2021-05-27 ENCOUNTER — Telehealth: Payer: Self-pay | Admitting: *Deleted

## 2021-05-27 NOTE — Telephone Encounter (Signed)
Transition Care Management Unsuccessful Follow-up Telephone Call  Date of discharge and from where:  [facility] 05-25-2021  Attempts:  1st Attempt  Reason for unsuccessful TCM follow-up call:  Unable to leave message    Patient is scheduled for a hospital follow up 05-29-2021 [facility]

## 2021-05-28 ENCOUNTER — Telehealth: Payer: Self-pay | Admitting: Family Medicine

## 2021-05-28 NOTE — Telephone Encounter (Signed)
Home Health Verbal Orders - Caller/Agency: [facility] Callback Number: [phone] Secure line Requesting Skilled Nursing/Social worker and PT physical therapy will be eval Frequency: PT Eval  1 wk 8   Also needs clarification on medication.

## 2021-05-29 ENCOUNTER — Encounter: Payer: Self-pay | Admitting: Family Medicine

## 2021-05-29 ENCOUNTER — Ambulatory Visit (INDEPENDENT_AMBULATORY_CARE_PROVIDER_SITE_OTHER): Payer: Medicare HMO | Admitting: Family Medicine

## 2021-05-29 ENCOUNTER — Other Ambulatory Visit: Payer: Self-pay

## 2021-05-29 VITALS — BP 109/64 | HR 62 | Temp 98.2°F | Wt 142.8 lb

## 2021-05-29 DIAGNOSIS — E042 Nontoxic multinodular goiter: Secondary | ICD-10-CM

## 2021-05-29 DIAGNOSIS — E0521 Thyrotoxicosis with toxic multinodular goiter with thyrotoxic crisis or storm: Secondary | ICD-10-CM | POA: Diagnosis not present

## 2021-05-29 DIAGNOSIS — E0590 Thyrotoxicosis, unspecified without thyrotoxic crisis or storm: Secondary | ICD-10-CM

## 2021-05-29 DIAGNOSIS — I509 Heart failure, unspecified: Secondary | ICD-10-CM

## 2021-05-29 DIAGNOSIS — E7800 Pure hypercholesterolemia, unspecified: Secondary | ICD-10-CM

## 2021-05-29 DIAGNOSIS — I10 Essential (primary) hypertension: Secondary | ICD-10-CM

## 2021-05-29 DIAGNOSIS — I4891 Unspecified atrial fibrillation: Secondary | ICD-10-CM | POA: Diagnosis not present

## 2021-05-29 NOTE — Telephone Encounter (Signed)
Ok for verbal orders ?

## 2021-05-29 NOTE — Discharge Summary (Signed)
CRITICAL CARE     Name: Tyler Cohen MRN: [id number] DOB: 10/09/1955     LOS: 3   SUBJECTIVE FINDINGS & SIGNIFICANT EVENTS    Patient description:  66 y.o male with significant PMH of hypertension, Hyperlipidemia, renal mass, adrenal mass, former smoker, hyperthyroidism treated with PTU and methimazole, and multinodular goiter (US Thyroid 2019) who presented to the ED with chief complaints of progressive shortness of breath since yesterday.   Patient state he was seen at the urgent care for reports of acute dyspnea associated with dry cough, chest tightness, palpitations, nausea and vomiting. Patient was found to be tachycardic to 156 in the clinic with O2 sat 100% on room air and afebrile. He was sent to the ER for further evaluation and management given symptomatology, tachycardia.   ED Course: In the emergency department, the temperature was 37.1C, the heart rate 150 beats/minute, the blood pressure 112/85 mm Hg, the respiratory rate 28 breaths/minute, and the oxygen saturation 98% on 2L. Labs revealed unremarkable CBC, slightly elevated AST/ALT: 65/119 otherwise unremarkable CMP. Thyroid function tests ordered on admission revealed a thyroid-stimulating hormone (TSH) level of <0.01 ?U/mL and a free thyroxine level of 3.49ng/dL, confirming the diagnosis of thyrotoxicosis. The patient was started on 1 gram of propylthiouracil, 20 mg of propranolol, 100 mg of hydrocortisone, 4 grams of cholestyramine, 20 mg IV Lasix as well as empiric antibiotics for possible right basilar pneumonia noted on chest x-ray. Patient remains tachycardic, borderline hypoxic after receiving medications.  He has been accepted to [facility] but no bed will be available for at least the next 12 hours.  Given patient's critical illness, PCCM team  consulted for admission pending transfer.  Lines/tubes : Urethral Catheter Jared, NT Temperature probe 14 Fr. (Active)  Indication for Insertion or Continuance of Catheter Bladder outlet obstruction / other urologic reason 05/15/21 0800  Site Assessment Clean;Intact;Dry 05/15/21 0800  Catheter Maintenance Bag below level of bladder;Seal intact;Bag emptied prior to transport;Catheter secured;Drainage bag/tubing not touching floor;Insertion date on drainage bag;No dependent loops 05/15/21 0800  Collection Container Standard drainage bag 05/15/21 0800  Securement Method Securing device (Describe) 05/15/21 0800  Urinary Catheter Interventions (if applicable) Unclamped 17/40/81 0800  Output (mL) 50 mL 05/15/21 0800    Microbiology/Sepsis markers: Results for orders placed or performed during the hospital encounter of 05/13/21  Resp Panel by RT-PCR (Flu A&B, Covid) Nasopharyngeal Swab     Status: None   Collection Time: 05/13/21 12:25 PM   Specimen: Nasopharyngeal Swab; Nasopharyngeal(NP) swabs in vial transport medium  Result Value Ref Range Status   SARS Coronavirus 2 by RT PCR NEGATIVE NEGATIVE Final    Comment: (NOTE) SARS-CoV-2 target nucleic acids are NOT DETECTED.  The SARS-CoV-2 RNA is generally detectable in upper respiratory specimens during the acute phase of infection. The lowest concentration of SARS-CoV-2 viral copies this assay can detect is 138 copies/mL. A negative result does not preclude SARS-Cov-2 infection and should not be used as the sole basis for treatment or other patient management decisions. A negative result may occur with  improper specimen collection/handling, submission of specimen other than nasopharyngeal swab, presence of viral mutation(s) within the areas targeted by this assay, and inadequate number of viral copies(<138 copies/mL). A negative result must be combined with clinical observations, patient history, and epidemiological information. The  expected result is Negative.  Fact Sheet for Patients:  EntrepreneurPulse.com.au  Fact Sheet for Healthcare Providers:  IncredibleEmployment.be  This test is no t yet approved or cleared by the [place]  States FDA and  has been authorized for detection and/or diagnosis of SARS-CoV-2 by FDA under an Emergency Use Authorization (EUA). This EUA will remain  in effect (meaning this test can be used) for the duration of the COVID-19 declaration under Section 564(b)(1) of the Act, 21 U.S.C.section 360bbb-3(b)(1), unless the authorization is terminated  or revoked sooner.       Influenza A by PCR NEGATIVE NEGATIVE Final   Influenza B by PCR NEGATIVE NEGATIVE Final    Comment: (NOTE) The Xpert Xpress SARS-CoV-2/FLU/RSV plus assay is intended as an aid in the diagnosis of influenza from Nasopharyngeal swab specimens and should not be used as a sole basis for treatment. Nasal washings and aspirates are unacceptable for Xpert Xpress SARS-CoV-2/FLU/RSV testing.  Fact Sheet for Patients: EntrepreneurPulse.com.au  Fact Sheet for Healthcare Providers: IncredibleEmployment.be  This test is not yet approved or cleared by the [place] FDA and has been authorized for detection and/or diagnosis of SARS-CoV-2 by FDA under an Emergency Use Authorization (EUA). This EUA will remain in effect (meaning this test can be used) for the duration of the COVID-19 declaration under Section 564(b)(1) of the Act, 21 U.S.C. section 360bbb-3(b)(1), unless the authorization is terminated or revoked.  Performed at [facility], [place], [place] [address]   Respiratory (~20 pathogens) panel by PCR     Status: None   Collection Time: 05/13/21 12:25 PM   Specimen: Nasopharyngeal Swab; Respiratory  Result Value Ref Range Status   Adenovirus NOT DETECTED NOT DETECTED Final   Coronavirus 229E NOT DETECTED NOT  DETECTED Final    Comment: (NOTE) The Coronavirus on the Respiratory Panel, DOES NOT test for the novel  Coronavirus (2019 nCoV)    Coronavirus HKU1 NOT DETECTED NOT DETECTED Final   Coronavirus NL63 NOT DETECTED NOT DETECTED Final   Coronavirus OC43 NOT DETECTED NOT DETECTED Final   Metapneumovirus NOT DETECTED NOT DETECTED Final   Rhinovirus / Enterovirus NOT DETECTED NOT DETECTED Final   Influenza A NOT DETECTED NOT DETECTED Final   Influenza B NOT DETECTED NOT DETECTED Final   Parainfluenza Virus 1 NOT DETECTED NOT DETECTED Final   Parainfluenza Virus 2 NOT DETECTED NOT DETECTED Final   Parainfluenza Virus 3 NOT DETECTED NOT DETECTED Final   Parainfluenza Virus 4 NOT DETECTED NOT DETECTED Final   Respiratory Syncytial Virus NOT DETECTED NOT DETECTED Final   Bordetella pertussis NOT DETECTED NOT DETECTED Final   Bordetella Parapertussis NOT DETECTED NOT DETECTED Final   Chlamydophila pneumoniae NOT DETECTED NOT DETECTED Final   Mycoplasma pneumoniae NOT DETECTED NOT DETECTED Final    Comment: Performed at [facility], [address] [place], [place], [place] [address]  MRSA Next Gen by PCR, Nasal     Status: None   Collection Time: 05/14/21 12:48 AM   Specimen: Nasal Mucosa; Nasal Swab  Result Value Ref Range Status   MRSA by PCR Next Gen NOT DETECTED NOT DETECTED Final    Comment: (NOTE) The GeneXpert MRSA Assay (FDA approved for NASAL specimens only), is one component of a comprehensive MRSA colonization surveillance program. It is not intended to diagnose MRSA infection nor to guide or monitor treatment for MRSA infections. Test performance is not FDA approved in patients less than 36 years old. Performed at [facility], [place], [place] [address]   CULTURE, BLOOD (ROUTINE X 2) w Reflex to ID Panel     Status: None   Collection Time: 05/14/21  1:12 AM  Specimen: BLOOD  Result Value Ref Range Status   Specimen Description BLOOD RIGHT ASSIST  CONTROL  Final   Special Requests   Final    BOTTLES DRAWN AEROBIC AND ANAEROBIC Blood Culture adequate volume   Culture   Final    NO GROWTH 5 DAYS Performed at [facility], [address] [place], [place] [address]    Report Status 05/19/2021 FINAL  Final  CULTURE, BLOOD (ROUTINE X 2) w Reflex to ID Panel     Status: None   Collection Time: 05/14/21  1:12 AM   Specimen: BLOOD  Result Value Ref Range Status   Specimen Description BLOOD RIGHT HAND  Final   Special Requests   Final    BOTTLES DRAWN AEROBIC AND ANAEROBIC Blood Culture adequate volume   Culture   Final    NO GROWTH 5 DAYS Performed at [facility], [address], [place], [place] [address]    Report Status 05/19/2021 FINAL  Final    Anti-infectives:  Anti-infectives (From admission, onward)    Start     Dose/Rate Route Frequency Ordered Stop   05/14/21 2200  doxycycline (VIBRA-TABS) tablet 100 mg  Status:  Discontinued        100 mg Oral Every 12 hours 05/14/21 1403 05/16/21 1149   05/14/21 2100  cefTRIAXone (ROCEPHIN) 2 g in sodium chloride 0.9 % 100 mL IVPB  Status:  Discontinued        2 g 200 mL/hr over 30 Minutes Intravenous Every 24 hours 05/14/21 0642 05/16/21 1149   05/14/21 1000  doxycycline (VIBRAMYCIN) 100 mg in sodium chloride 0.9 % 250 mL IVPB  Status:  Discontinued        100 mg 125 mL/hr over 120 Minutes Intravenous Every 12 hours 05/14/21 0642 05/14/21 1403   05/14/21 0000  cefTRIAXone 2 g in sodium chloride 0.9 % 100 mL  Status:  Discontinued        2 g Intravenous Every 24 hours 05/14/21 1405 05/29/21    05/14/21 0000  doxycycline (VIBRA-TABS) 100 MG tablet  Status:  Discontinued        100 mg Oral Every 12 hours 05/14/21 1405 05/29/21    05/13/21 2030  cefTRIAXone (ROCEPHIN) 2 g in sodium chloride 0.9 % 100 mL IVPB        2 g 200 mL/hr over 30 Minutes Intravenous  Once 05/13/21 2024 05/13/21 2130   05/13/21 2030  doxycycline (VIBRAMYCIN) 100 mg in sodium chloride 0.9 %  250 mL IVPB        100 mg 125 mL/hr over 120 Minutes Intravenous  Once 05/13/21 2024 05/14/21 0040        Consults: Treatment Team:  Minna Merritts, MD   PAST MEDICAL HISTORY   Past Medical History:  Diagnosis Date   Arthritis    Heart murmur    Hyperlipidemia    Macrocytosis    Thyroid disease    Tobacco abuse      SURGICAL HISTORY   Past Surgical History:  Procedure Laterality Date   COLONOSCOPY WITH PROPOFOL N/A 12/01/2015   Procedure: COLONOSCOPY WITH PROPOFOL;  Surgeon: Lucilla Lame, MD;  Location: [place];  Service: Endoscopy;  Laterality: N/A;   POLYPECTOMY  12/01/2015   Procedure: POLYPECTOMY;  Surgeon: Lucilla Lame, MD;  Location: [place];  Service: Endoscopy;;     FAMILY HISTORY   Family History  Problem Relation Age of Onset   Hypertension Mother    Diabetes Mother  lost both legs   Kidney disease Mother    Hypertension Father    Emphysema Father    Sickle cell trait Father    Hypertension Brother    Hyperlipidemia Brother    Sickle cell trait Sister      SOCIAL HISTORY   Social History   Tobacco Use   Smoking status: Former    Packs/day: 0.25    Years: 42.00    Pack years: 10.50    Types: Cigars, Cigarettes   Smokeless tobacco: Never   Tobacco comments:    4 cigars a day   Vaping Use   Vaping Use: Never used  Substance Use Topics   Alcohol use: No   Drug use: Not Currently    Types: Marijuana    Comment: pt states he smokes every once in a while     MEDICATIONS   Current Medication: No current facility-administered medications for this encounter.  Current Outpatient Medications:    amiodarone (PACERONE) 200 MG tablet, Take 2 tabs 2x daily for 3 days, then 1 tab 1x daily for 27 days, Disp: , Rfl:    apixaban (ELIQUIS) 5 MG TABS tablet, Take 1 tablet by mouth in the morning and at bedtime., Disp: , Rfl:    digoxin (LANOXIN) 0.25 MG tablet, Take 1 tablet (0.25 mg total) by mouth daily., Disp: 30  tablet, Rfl: 11   empagliflozin (JARDIANCE) 10 MG TABS tablet, Take 1 tablet by mouth daily., Disp: , Rfl:    furosemide (LASIX) 40 MG tablet, Take 1 tablet by mouth daily., Disp: , Rfl:    methimazole (TAPAZOLE) 5 MG tablet, Take 2 tablets by mouth daily., Disp: , Rfl:    polyethylene glycol (MIRALAX / GLYCOLAX) 17 g packet, Take 17 g by mouth daily as needed for moderate constipation., Disp: 14 each, Rfl: 0   spironolactone (ALDACTONE) 25 MG tablet, Take 0.5 tablets by mouth daily., Disp: , Rfl:     ALLERGIES   Lactose intolerance (gi)    REVIEW OF SYSTEMS     10 point ros negative except nausea and right chest discomfort  PHYSICAL EXAMINATION   Vital Signs: Temp:  [98.2 F (36.8 C)] 98.2 F (36.8 C) (02/24 0912) Pulse Rate:  [62] 62 (02/24 0912) BP: (109)/(64) 109/64 (02/24 0912) SpO2:  [98 %] 98 % (02/24 0912) Weight:  [64.8 kg] 64.8 kg (02/24 0912)  GENERAL:mild distress age appropriate HEAD: Normocephalic, atraumatic.  EYES: Pupils equal, round, reactive to light.  No scleral icterus.  MOUTH: Moist mucosal membrane. NECK: Supple. No thyromegaly. No nodules. No JVD.  PULMONARY: CTAB CARDIOVASCULAR: S1 and S2. Regular rate and rhythm. No murmurs, rubs, or gallops.  GASTROINTESTINAL: Soft, nontender, non-distended. No masses. Positive bowel sounds. No hepatosplenomegaly.  MUSCULOSKELETAL: No swelling, clubbing, or edema.  NEUROLOGIC: Mild distress due to acute illness SKIN:intact,warm,dry   PERTINENT DATA     Infusions:   Scheduled Medications:   PRN Medications:  Hemodynamic parameters:   Intake/Output: No intake/output data recorded.  Ventilator  Settings:     LAB RESULTS:  Basic Metabolic Panel: No results for input(s): NA, K, CL, CO2, GLUCOSE, BUN, CREATININE, CALCIUM, MG, PHOS in the last 168 hours.  Liver Function Tests: No results for input(s): AST, ALT, ALKPHOS, BILITOT, PROT, ALBUMIN in the last 168 hours.  No results for input(s):  LIPASE, AMYLASE in the last 168 hours. No results for input(s): AMMONIA in the last 168 hours. CBC: No results for input(s): WBC, NEUTROABS, HGB, HCT, MCV, PLT in the last  168 hours.  Cardiac Enzymes: No results for input(s): CKTOTAL, CKMB, CKMBINDEX, TROPONINI in the last 168 hours.  BNP: Invalid input(s): POCBNP CBG: No results for input(s): GLUCAP in the last 168 hours.      IMAGING RESULTS:  Imaging: No results found. @PROBHOSP @ No results found.   ASSESSMENT AND PLAN    -Multidisciplinary rounds held today  Thyroid Storm likely secondary to Toxic Multinodular Goiter  Burch-Wartofsky score: 65 on admission PMHx: Multinodular goiter, Adrenal mass, Hyperthyroidism noncompliant with meds -change ptu to methimazole today due to transaminitis --Propranolol 20 mg po q6hr. Monitor for worsening cardiogenic pulmonary edema/shock -Continue Stress dose 50 mg IV Q6hr following loading dose -Start Cholestyramine 4 grams orally q6hrs. -Blood cultures & infectious workup as indicated. -Monitor I&O's / urinary output -Follow BMP+ mag. Replace electrolytes as indicated -Avoid aspirin or NSAIDS  -Obtain US Thyroid -Endocrinology not available inpatient, Patient has been accepted to [facility] hospital by Dr. Illene Labrador     Acute Hypoxic Respiratory Failure secondary to Pulmonary Edema and Multifocal Pneumonia -Supplemental O2 as needed to maintain O2 saturations > 92% -High risk for intubation -Follow intermittent ABG and chest x-ray as needed -Obtain CTA Chest -F/u blood and cultures, trend Lactic/ PCT -Monitor WBC/ fever curve -Start Empiric antibiotics with Ceftriaxone and Doxy -Gentle IVF hydration as needed -Pressors for MAP goal >65 -Encourage smoking cessation   Acute Decompensated CHF likely high output in the setting of Thyroid storm Atrial Flutter/SVT Hx: Hypertension, HLD  -Continuous cardiac monitoring -Serial EKG -Maintain MAP greater than 65 -Rate control  with Propranolol as above -PRN Lasix as BP and renal function permits -Obtain 2D Echocardiogram   Elevated LFTs likely in the setting of above Mild elevation in AST, ALT, LDH, bilirubin, ALP -Trend CMP -Avoid Hepatotoxins -Transition PTU to Methimazole if worsening Liver enzymes     Best practice:  Diet:  Oral Pain/Anxiety/Delirium protocol (if indicated): No VAP protocol (if indicated): Not indicated DVT prophylaxis: LMWH GI prophylaxis: PPI Glucose control:  SSI Yes Central venous access:  N/A Arterial line:  N/A Foley:  Yes, and it is still needed Mobility:  bed rest  PT consulted: N/A Last date of multidisciplinary goals of care discussion [2/8] Code Status:  full code Disposition: ICU  GI/Nutrition GI PROPHYLAXIS as indicated DIET-->TF's as tolerated Constipation protocol as indicated  ENDO - ICU hypoglycemic\Hyperglycemia protocol -check FSBS per protocol   ELECTROLYTES -follow labs as needed -replace as needed -pharmacy consultation   DVT/GI PRX ordered -SCDs  TRANSFUSIONS AS NEEDED MONITOR FSBS ASSESS the need for LABS as needed  Critical care provider statement:   Total critical care time: 33 minutes   Performed by: Lanney Gins MD   Critical care time was exclusive of separately billable procedures and treating other patients.   Critical care was necessary to treat or prevent imminent or life-threatening deterioration.   Critical care was time spent personally by me on the following activities: development of treatment plan with patient and/or surrogate as well as nursing, discussions with consultants, evaluation of patient's response to treatment, examination of patient, obtaining history from patient or surrogate, ordering and performing treatments and interventions, ordering and review of laboratory studies, ordering and review of radiographic studies, pulse oximetry and re-evaluation of patient's condition.    Ottie Glazier, M.D.  Pulmonary &  Critical Care Medicine     This document was prepared using Dragon voice recognition software and may include unintentional dictation errors.    Ottie Glazier, M.D.  Division of Pulmonary & Critical  [place]

## 2021-05-29 NOTE — Telephone Encounter (Signed)
Spoke with Tyler Cohen from [place] and provided her with verbal OK orders. Tyler Cohen is asking if patient should continue to take the Jardiance that he prescribed while being hospitalized. Please advise?

## 2021-05-29 NOTE — Telephone Encounter (Signed)
Tyler Cohen was made aware of Dr.Johnson's recommendations and verbalized understanding and has no further questions at this time.

## 2021-05-29 NOTE — Progress Notes (Signed)
BP 109/64    Pulse 62    Temp 98.2 F (36.8 C)    Wt 142 lb 12.8 oz (64.8 kg)    SpO2 98%    BMI 18.84 kg/m    Subjective:    Patient ID: Tyler Cohen, male    DOB: 1956/04/01, 66 y.o.   MRN: [id number]  HPI: Tyler Cohen is a 66 y.o. male  Chief Complaint  Patient presents with   Hospitalization Follow-up    Patient was admitted into hospital due to his thyroid disorder effecting his heart.    Transition of Care Hospital Follow up.   Hospital/Facility: [facility] with transfer to [facility] D/C Physician: Dr. Posey Pronto D/C Date: 05/25/21  Records Requested: 05/29/21 Records Received: 05/29/21 Records Reviewed: 05/29/21  Diagnoses on Discharge: thyrotoxicosis, Pneumonia of right lower lobe due to infectious organism (Primary Dx);  Atrial flutter, unspecified type (CMS-HCC);  Shock, cardiogenic (CMS-HCC);  Transaminitis;  AKI (acute kidney injury) (CMS-HCC);  Persistent atrial fibrillation (CMS-HCC);   Date of interactive Contact within 48 hours of discharge: 05/28/21 Contact was through: phone  Date of 7 day or 14 day face-to-face visit:  05/29/21  within 7 days  Outpatient Encounter Medications as of 05/29/2021  Medication Sig Note   apixaban (ELIQUIS) 5 MG TABS tablet Take 1 tablet by mouth in the morning and at bedtime.    empagliflozin (JARDIANCE) 10 MG TABS tablet Take 1 tablet by mouth daily.    furosemide (LASIX) 40 MG tablet Take 1 tablet by mouth daily.    methimazole (TAPAZOLE) 5 MG tablet Take 2 tablets by mouth daily.    polyethylene glycol (MIRALAX / GLYCOLAX) 17 g packet Take 17 g by mouth daily as needed for moderate constipation.    spironolactone (ALDACTONE) 25 MG tablet Take 0.5 tablets by mouth daily. 05/29/2021: 12.5 mg (1/2 tablet/ once daily) in the morning    [DISCONTINUED] amiodarone (PACERONE) 200 MG tablet Take by mouth.    [DISCONTINUED] digoxin (LANOXIN) 0.25 MG tablet Take by mouth.    amiodarone (PACERONE) 200 MG tablet Take 2 tabs 2x daily for 3 days,  then 1 tab 1x daily for 27 days    digoxin (LANOXIN) 0.25 MG tablet Take 1 tablet (0.25 mg total) by mouth daily.    [DISCONTINUED] acetaminophen (TYLENOL) 325 MG tablet Take 2 tablets (650 mg total) by mouth every 6 (six) hours as needed for mild pain, fever or headache. (Patient not taking: Reported on 05/29/2021)    [DISCONTINUED] cefTRIAXone 2 g in sodium chloride 0.9 % 100 mL Inject 2 g into the vein daily. (Patient not taking: Reported on 05/29/2021)    [DISCONTINUED] Chlorhexidine Gluconate Cloth 2 % PADS Apply 6 each topically daily at 6 (six) AM. (Patient not taking: Reported on 05/29/2021)    [DISCONTINUED] cholestyramine (QUESTRAN) 4 g packet Take 1 packet (4 g total) by mouth 3 (three) times daily. (Patient not taking: Reported on 05/29/2021)    [DISCONTINUED] docusate sodium (COLACE) 100 MG capsule Take 1 capsule (100 mg total) by mouth 2 (two) times daily as needed for mild constipation. (Patient not taking: Reported on 05/29/2021)    [DISCONTINUED] doxycycline (VIBRA-TABS) 100 MG tablet Take 1 tablet (100 mg total) by mouth every 12 (twelve) hours. (Patient not taking: Reported on 05/29/2021)    [DISCONTINUED] enoxaparin (LOVENOX) 40 MG/0.4ML injection Inject 0.4 mLs (40 mg total) into the skin daily. (Patient not taking: Reported on 05/29/2021)    [DISCONTINUED] feeding supplement (ENSURE ENLIVE / ENSURE PLUS) LIQD Take  237 mLs by mouth 3 (three) times daily between meals. (Patient not taking: Reported on 05/29/2021)    [DISCONTINUED] heparin 25000 UT/250ML infusion Inject 850 Units/hr into the vein continuous. (Patient not taking: Reported on 05/29/2021)    [DISCONTINUED] methylPREDNISolone sodium succinate (SOLU-MEDROL) 40 mg/mL injection Inject 1 mL (40 mg total) into the vein 2 (two) times daily. (Patient not taking: Reported on 05/29/2021)    [DISCONTINUED] Multiple Vitamin (MULTIVITAMIN WITH MINERALS) TABS tablet Take 1 tablet by mouth daily. (Patient not taking: Reported on 05/29/2021)     [DISCONTINUED] ondansetron (ZOFRAN) 4 MG/2ML SOLN injection Inject 2 mLs (4 mg total) into the vein every 6 (six) hours. (Patient not taking: Reported on 05/29/2021)    [DISCONTINUED] polyethylene glycol powder (GLYCOLAX/MIRALAX) 17 GM/SCOOP powder Take by mouth. (Patient not taking: Reported on 05/29/2021)    [DISCONTINUED] propranolol (INDERAL) 20 MG tablet Take 1 tablet (20 mg total) by mouth every 6 (six) hours. (Patient not taking: Reported on 05/29/2021)    [DISCONTINUED] propylthiouracil (PTU) 50 MG tablet Take 4 tablets (200 mg total) by mouth every 4 (four) hours. (Patient not taking: Reported on 05/29/2021)    [DISCONTINUED] sodium chloride 0.9 % SOLN 50 mL with promethazine 25 MG/ML SOLN 12.5 mg Inject 12.5 mg into the vein every 6 (six) hours as needed. (Patient not taking: Reported on 05/29/2021)    No facility-administered encounter medications on file as of 05/29/2021.  Per Hospitalist: "Brief History of Present Illness:  HPI obtained by admitting nurse practitioner. 66 y.o. male with PMH of hypertension, hyperlipidemia, renal mass, adrenal mass, former smoker, hyperthyroidism treated with PTU and methimazole, and multinodular goiter who presented to an urgent care on 05/13/21 with progressive shortness of breath and chest tightness for one day. He was found to be tachycardiac to 156, sating 100% on room air, and afebrile. He was sent to ED for further evaluation.    At the outside hospital's ED, lab values were significant for TSH < 0.01 and free T4 3.49, confirming the diagnosis of thyrotoxicosis. He has a history of medication noncompliance due to financial needs. He was treated with PTU, propanolol, hydrocortisone and cholestyramine. CXR showed mild pulmonary edema and right basilar consolidation concerning for pneumonia. He started on empiric antibiotics.    Over the following two days, patient continued to be in AFRVR/ atrial flutter. Patient started on a heparin drip. ECHO revealed EF of  25% and global hypokinesis. His oxygen requirements increased to 15L and eventually weaned down to room air again. Blood pressure softed to SBP 80s, requiring higher level of care. Patient is admitted to [facility] ICU on 2/11 for thyrotoxisis and hypotension.  _____________________  Hospital Course by Problem: 74 M with Hx of hyperthyroidism (non-adherent to PTU and methimazole due to insurance issues), renal mass/adrenal mass transferred from OSH due to dyspnea and chest tightness. Patient found to be in thyroid storm with Afib with RVR and community-acquired pneumonia. He was initially admitted to the ICU where he was rate-controlled with digoxin and dobutamine along with heparin infusion. He also received partial load of amiodarone, but this was discontinued in order to avoid thyrotoxicity. Endo initiated methimazole and hydrocortisone. Dobutamine discontinued 2/16 and patient transferred to hospitalist medicine. Patient's thyroid medications further titrated on the floor and outpatient follow up set up. He did have issues with atrial flutter with rapid ventricular response once again in stepdown unit. Once amiodarone was started again, he converted to NSR. He will be d/c'd with digoxin and amiodarone as benefits outweigh risks.  #  Thyrotoxicosis/thyroid storm due to poor compliance to therapy # Poor compliance to therapy # Hyperthyroidism # Multinodular goiter - According to the current H&P, pt presented to OSH with initial TSH < 0.01 and FT4 3.49.  - Given Propanolol and then developed hypotension. Propanolol was discontinued.  - Hydrocortisone and cholestyramine d/c'd 2/17 Plan: - Continue Methimazole. D/c'ing home with methimazole 69m daily based on TFTs obtained day of discharge. - Endo following. They have set him up for outpatient follow up. - Anticipate patient will need to be re-evaluated for thyroidectomy as outpatient - PT/OT recommending home    # Cardiogenic shock: # Acute  decompensated heart failure:  - ECHO on 2/9: EF 25% with global hypokinesis - Will avoid beta blockeras they may exacerbate shock - Initially treated with Milrinone for inotropic support in favor of dobutamine as less likely to be arrhythmogenic.  - Cardiologist consulted and initially suggested amiodarone, and then changed to Dobutamine + Digoxin on 2/15. Dobutamine was discontinued on 2/16. Continued Digoxin for rate control instead of Propranolol with concerns for low output CHF and hypotension. Amiodarone had to be resumed due to uncontrollable rapid atrial flutter. He converted to NSR after amiodarone resumed 2/19. - Will complete amiodarone load and transition to maintenance dose at home - Digoxin 0.237mdaily - Cardiologist is actively following.     # A fib with RVR # A flutter - Was on heparin gtt, but converted to Eliquis 2/17 - Cannot start beta blocker due to history of hypotension associated with beta blocker - Cardiologist consulted and suggested TEE and DCCV with only brief conversion to SR on 2/13.  - Continue Digoxin and amiodarone as mentioned above    # Transaminitis - Likely due to decompensated CHF - KUB at OSH showed non obstructive bowel gas pattern - Normal Bil, AST and alk phos - AST and ALT trending down: AST 47. ALT 155.     # AKI # Hyperkalemia # Hypervolemic hyponatremia # Elevated Anion gap Metabolic acidosis #  Likely decompensated CHF - On admission, Cr. 2.9 with baseline around 0.9. Gradually trending down to 1.1 by 2/16 - Na 132- 135.  - K 5.2 > 4.6 > 4.2 > 4.4 - CO2 normal with closed AG.  - Will keep monitoring.     # Right basilar opacity  - Atelectasis VS infection - Currently not on antibiotics - No sign of infection noticed at this moment.  - Will follow up procalcitonin.    # Adrenal mass - Recent MRI showed 9 mm nodular thickening  - Metanephrine elevated, likely falsely elevated.  - Endo suggested outpatient FU with endo    #  Anxiety - Mood currently stable.  - Not on any home anxiolytics.     # BPH # elevated PSA - Fu with urologist in outpatient setting - Hold Tamsulosin for hypotension now. "  Diagnostic Tests Reviewed: ECHOCARDIOGRAM TRANSESOPHAGEAL  CONCLUSIONS SEVERE LV DYSFUNCTION (See above) LVEF: 20-25% NO THROMBUS NOTED IN LEFT ATRIAL / LEFT ATRIAL APPENDAGE MILD AR, TRIVIAL PR , TRIVIAL MR AND TRIVIAL TR POSSIBLE PFO OBSERVED BY COLOR FLOW IMAGES 32 AND 33 TEE PROBE # 5 XR CHEST SINGLE VIEW PORTABLE Impression Lines/Tubes/Devices: Left internal jugular central venous catheter with tip at the cavoatrial junction.  Heart/mediastinum: Cardiomegaly, unchanged. Pulmonary artery also appears enlarged as can be seen in the setting of pulmonary hypertension.  Lungs: Unchanged right basilar opacity which may represent atelectasis versus infection. No new focal consolidation. Vascular congestion without overt pulmonary edema.  Pleura: No large effusion. No pneumothorax.  Bones: No acute abnormality.    Disposition: Home with home health  Consults: Endocrine, cardiology  Discharge Instructions: seeing Cardiology on 06/04/21. Seeing Endocrinology 06/16/21  Disease/illness Education: Discussed today  Home Health/Community Services Discussions/Referrals: In place  Establishment or re-establishment of referral orders for community resources: In place  Discussion with other health care providers: None  Assessment and Support of treatment regimen adherence: [place] with: Patient   Education for self-management, independent living, and ADLs: Discussed today  Since getting out of the hospital he has been feeling well. He says he has been taking his medicine. He is frustrated as he is currently staying with his sister and he feels like she fusses at him. He denies any swelling. No chest pain. No headaches. No SOB. No palpitations. He has an appointment with endocrinology  coming up, but he is hoping he doesn't have to go to [place]. He is otherwise doing well with no other concerns or complaints at this time.   Relevant past medical, surgical, family and social history reviewed and updated as indicated. Interim medical history since our last visit reviewed. Allergies and medications reviewed and updated.  Review of Systems  Constitutional: Negative.   Respiratory: Negative.    Cardiovascular: Negative.   Gastrointestinal: Negative.   Musculoskeletal: Negative.   Neurological: Negative.   Psychiatric/Behavioral: Negative.     Per HPI unless specifically indicated above     Objective:    BP 109/64    Pulse 62    Temp 98.2 F (36.8 C)    Wt 142 lb 12.8 oz (64.8 kg)    SpO2 98%    BMI 18.84 kg/m   Wt Readings from Last 3 Encounters:  05/29/21 142 lb 12.8 oz (64.8 kg)  05/15/21 161 lb 2.5 oz (73.1 kg)  04/16/21 138 lb (62.6 kg)    Physical Exam Vitals and nursing note reviewed.  Constitutional:      General: He is not in acute distress.    Appearance: Normal appearance. He is underweight. He is not ill-appearing, toxic-appearing or diaphoretic.  HENT:     Head: Normocephalic and atraumatic.     Right Ear: External ear normal.     Left Ear: External ear normal.     Nose: Nose normal.     Mouth/Throat:     Mouth: Mucous membranes are moist.     Pharynx: Oropharynx is clear.  Eyes:     General: No scleral icterus.       Right eye: No discharge.        Left eye: No discharge.     Extraocular Movements: Extraocular movements intact.     Conjunctiva/sclera: Conjunctivae normal.     Pupils: Pupils are equal, round, and reactive to light.  Cardiovascular:     Rate and Rhythm: Normal rate and regular rhythm.     Pulses: Normal pulses.     Heart sounds: Normal heart sounds. No murmur heard.   No friction rub. No gallop.  Pulmonary:     Effort: Pulmonary effort is normal. No respiratory distress.     Breath sounds: Normal breath sounds. No  stridor. No wheezing, rhonchi or rales.  Chest:     Chest wall: No tenderness.  Musculoskeletal:        General: Normal range of motion.     Cervical back: Normal range of motion and neck supple.  Skin:    General: Skin is warm and dry.  Capillary Refill: Capillary refill takes less than 2 seconds.     Coloration: Skin is not jaundiced or pale.     Findings: No bruising, erythema, lesion or rash.  Neurological:     General: No focal deficit present.     Mental Status: He is alert and oriented to person, place, and time. Mental status is at baseline.  Psychiatric:        Mood and Affect: Mood normal.        Behavior: Behavior normal.        Thought Content: Thought content normal.        Judgment: Judgment normal.    Results for orders placed or performed in visit on 05/29/21  Comprehensive metabolic panel  Result Value Ref Range   Glucose 76 70 - 99 mg/dL   BUN 32 (H) 8 - 27 mg/dL   Creatinine, Ser 1.47 (H) 0.76 - 1.27 mg/dL   eGFR 53 (L) >59 mL/min/1.73   BUN/Creatinine Ratio 22 10 - 24   Sodium 133 (L) 134 - 144 mmol/L   Potassium 5.6 (H) 3.5 - 5.2 mmol/L   Chloride 98 96 - 106 mmol/L   CO2 20 20 - 29 mmol/L   Calcium 9.9 8.6 - 10.2 mg/dL   Total Protein 7.7 6.0 - 8.5 g/dL   Albumin 4.7 3.8 - 4.8 g/dL   Globulin, Total 3.0 1.5 - 4.5 g/dL   Albumin/Globulin Ratio 1.6 1.2 - 2.2   Bilirubin Total 0.8 0.0 - 1.2 mg/dL   Alkaline Phosphatase 85 44 - 121 IU/L   AST 34 0 - 40 IU/L   ALT 65 (H) 0 - 44 IU/L  CBC with Differential/Platelet  Result Value Ref Range   WBC 8.1 3.4 - 10.8 x10E3/uL   RBC 5.34 4.14 - 5.80 x10E6/uL   Hemoglobin 17.1 13.0 - 17.7 g/dL   Hematocrit 50.9 37.5 - 51.0 %   MCV 95 79 - 97 fL   MCH 32.0 26.6 - 33.0 pg   MCHC 33.6 31.5 - 35.7 g/dL   RDW 13.1 11.6 - 15.4 %   Platelets 256 150 - 450 x10E3/uL   Neutrophils 62 Not Estab. %   Lymphs 30 Not Estab. %   Monocytes 5 Not Estab. %   Eos 2 Not Estab. %   Basos 1 Not Estab. %   Neutrophils  Absolute 5.1 1.4 - 7.0 x10E3/uL   Lymphocytes Absolute 2.4 0.7 - 3.1 x10E3/uL   Monocytes Absolute 0.4 0.1 - 0.9 x10E3/uL   EOS (ABSOLUTE) 0.1 0.0 - 0.4 x10E3/uL   Basophils Absolute 0.1 0.0 - 0.2 x10E3/uL   Immature Granulocytes 0 Not Estab. %   Immature Grans (Abs) 0.0 0.0 - 0.1 x10E3/uL  Thyroid Panel With TSH  Result Value Ref Range   TSH <0.005 (L) 0.450 - 4.500 uIU/mL   T4, Total 10.2 4.5 - 12.0 ug/dL   T3 Uptake Ratio 29 24 - 39 %   Free Thyroxine Index 3.0 1.2 - 4.9      Assessment & Plan:   Problem List Items Addressed This Visit       Cardiovascular and Mediastinum   Atrial fibrillation (HCC)    HR under good control. Follow up with cardiology as scheduled. Continue to monitor. Call with any concerns.       Relevant Medications   apixaban (ELIQUIS) 5 MG TABS tablet   furosemide (LASIX) 40 MG tablet   spironolactone (ALDACTONE) 25 MG tablet   amiodarone (PACERONE) 200 MG tablet  digoxin (LANOXIN) 0.25 MG tablet   Congestive heart failure ([place])    Euvolemic today. Checking labs today. Follow up with cardiology as scheduled. Continue to monitor. Call with any concerns.       Relevant Medications   apixaban (ELIQUIS) 5 MG TABS tablet   furosemide (LASIX) 40 MG tablet   spironolactone (ALDACTONE) 25 MG tablet   amiodarone (PACERONE) 200 MG tablet   digoxin (LANOXIN) 0.25 MG tablet   Other Relevant Orders   Comprehensive metabolic panel (Completed)   CBC with Differential/Platelet (Completed)   Amb ref to Medical Nutrition Therapy-MNT     Endocrine   Multinodular goiter    Continue current regimen. Needs to see endocrinology. Will put in referral to see if he can get in more locally- but advised him to keep his appointment with endocrinology in [place].       Relevant Medications   methimazole (TAPAZOLE) 5 MG tablet   Hyperthyroidism - Primary    Continue current regimen. Needs to see endocrinology. Will put in referral to see if he can get in more locally-  but advised him to keep his appointment with endocrinology in [place].       Relevant Medications   methimazole (TAPAZOLE) 5 MG tablet   Other Relevant Orders   Comprehensive metabolic panel (Completed)   Thyroid Panel With TSH (Completed)   Ambulatory referral to Endocrinology   Thyroid crisis or storm    Resolved. Needs to see endocrinology. Will put in referral to see if he can get in more locally- but advised him to keep his appointment with endocrinology in [place].       Relevant Medications   methimazole (TAPAZOLE) 5 MG tablet   Other Relevant Orders   Ambulatory referral to Endocrinology     Other   Hyperlipidemia    Consider medication. Continue to monitor.       Relevant Medications   apixaban (ELIQUIS) 5 MG TABS tablet   furosemide (LASIX) 40 MG tablet   spironolactone (ALDACTONE) 25 MG tablet   amiodarone (PACERONE) 200 MG tablet   digoxin (LANOXIN) 0.25 MG tablet   Other Relevant Orders   Comprehensive metabolic panel (Completed)     Follow up plan: Return in about 4 weeks (around 06/26/2021).  >30 minutes spent with patient today

## 2021-05-29 NOTE — Telephone Encounter (Signed)
Yes. He's on it for heart failure. Not diabetes

## 2021-05-30 LAB — CBC WITH DIFFERENTIAL/PLATELET
Basophils Absolute: 0.1 10*3/uL (ref 0.0–0.2)
Basos: 1 %
EOS (ABSOLUTE): 0.1 10*3/uL (ref 0.0–0.4)
Eos: 2 %
Hematocrit: 50.9 % (ref 37.5–51.0)
Hemoglobin: 17.1 g/dL (ref 13.0–17.7)
Immature Grans (Abs): 0 10*3/uL (ref 0.0–0.1)
Immature Granulocytes: 0 %
Lymphocytes Absolute: 2.4 10*3/uL (ref 0.7–3.1)
Lymphs: 30 %
MCH: 32 pg (ref 26.6–33.0)
MCHC: 33.6 g/dL (ref 31.5–35.7)
MCV: 95 fL (ref 79–97)
Monocytes Absolute: 0.4 10*3/uL (ref 0.1–0.9)
Monocytes: 5 %
Neutrophils Absolute: 5.1 10*3/uL (ref 1.4–7.0)
Neutrophils: 62 %
Platelets: 256 10*3/uL (ref 150–450)
RBC: 5.34 x10E6/uL (ref 4.14–5.80)
RDW: 13.1 % (ref 11.6–15.4)
WBC: 8.1 10*3/uL (ref 3.4–10.8)

## 2021-05-30 LAB — COMPREHENSIVE METABOLIC PANEL
ALT: 65 IU/L — ABNORMAL HIGH (ref 0–44)
AST: 34 IU/L (ref 0–40)
Albumin/Globulin Ratio: 1.6 (ref 1.2–2.2)
Albumin: 4.7 g/dL (ref 3.8–4.8)
Alkaline Phosphatase: 85 IU/L (ref 44–121)
BUN/Creatinine Ratio: 22 (ref 10–24)
BUN: 32 mg/dL — ABNORMAL HIGH (ref 8–27)
Bilirubin Total: 0.8 mg/dL (ref 0.0–1.2)
CO2: 20 mmol/L (ref 20–29)
Calcium: 9.9 mg/dL (ref 8.6–10.2)
Chloride: 98 mmol/L (ref 96–106)
Creatinine, Ser: 1.47 mg/dL — ABNORMAL HIGH (ref 0.76–1.27)
Globulin, Total: 3 g/dL (ref 1.5–4.5)
Glucose: 76 mg/dL (ref 70–99)
Potassium: 5.6 mmol/L — ABNORMAL HIGH (ref 3.5–5.2)
Sodium: 133 mmol/L — ABNORMAL LOW (ref 134–144)
Total Protein: 7.7 g/dL (ref 6.0–8.5)
eGFR: 53 mL/min/{1.73_m2} — ABNORMAL LOW (ref >59)

## 2021-05-30 LAB — THYROID PANEL WITH TSH
Free Thyroxine Index: 3 (ref 1.2–4.9)
T3 Uptake Ratio: 29 % (ref 24–39)
T4, Total: 10.2 ug/dL (ref 4.5–12.0)
TSH: <0.005 u[IU]/mL — ABNORMAL LOW (ref 0.450–4.500)

## 2021-06-01 ENCOUNTER — Telehealth: Payer: Medicare HMO

## 2021-06-01 ENCOUNTER — Telehealth: Payer: Self-pay | Admitting: Licensed Clinical Social Worker

## 2021-06-01 NOTE — Telephone Encounter (Signed)
° °   Clinical Social Work  Care Management   Phone Outreach    06/01/2021 Name: Tyler Cohen MRN: [id number] DOB: Sep 08, 1955  Tyler Cohen is a 66 y.o. year old male who is a primary care patient of Valerie Roys, DO .   Reason for referral: [employer] .    3rd unsuccessful telephone outreach was attempted today. The patient was referred to the case management team for assistance with care management and care coordination. The patient's primary care provider has been notified of our unsuccessful attempts to make or maintain contact with the patient. The care management team is pleased to engage with this patient at any time in the future should he/she be interested in assistance from the care management team.   Plan: CCM LCSW will disconnect from care team. PCP is encouraged to submit new CCM referral, if additional needs arise.   Review of patient status, including review of consultants reports, relevant laboratory and other test results, and collaboration with appropriate care team members and the patient's provider was performed as part of comprehensive patient evaluation and provision of care management services.     Christa See, MSW, [email or web address] Phone [phone] 11:34 AM

## 2021-06-02 ENCOUNTER — Telehealth: Payer: Self-pay | Admitting: Family Medicine

## 2021-06-02 DIAGNOSIS — E7800 Pure hypercholesterolemia, unspecified: Secondary | ICD-10-CM

## 2021-06-02 DIAGNOSIS — I10 Essential (primary) hypertension: Secondary | ICD-10-CM | POA: Diagnosis not present

## 2021-06-02 NOTE — Telephone Encounter (Signed)
Spoke with Amy and notified her of OK for verbal orders for patient. Amy verbalized understanding and has no further questions at this time.

## 2021-06-02 NOTE — Telephone Encounter (Signed)
Ok for verbal 

## 2021-06-02 NOTE — Telephone Encounter (Signed)
Copied from [place] [id number]. Topic: Quick Communication - Home Health Verbal Orders >> Jun 02, 2021  8:40 AM [place] wrote: Caller/Agency: [facility] Number: [phone] Requesting: PT  Frequency: 2 times a week for for 3 wk and 1 time a wk for 3

## 2021-06-04 DIAGNOSIS — Z5900 Homelessness unspecified: Secondary | ICD-10-CM | POA: Diagnosis not present

## 2021-06-04 DIAGNOSIS — E0521 Thyrotoxicosis with toxic multinodular goiter with thyrotoxic crisis or storm: Secondary | ICD-10-CM | POA: Diagnosis not present

## 2021-06-04 DIAGNOSIS — I480 Paroxysmal atrial fibrillation: Secondary | ICD-10-CM | POA: Diagnosis not present

## 2021-06-04 DIAGNOSIS — I5020 Unspecified systolic (congestive) heart failure: Secondary | ICD-10-CM | POA: Diagnosis not present

## 2021-06-04 DIAGNOSIS — I700 Atherosclerosis of aorta: Secondary | ICD-10-CM | POA: Diagnosis not present

## 2021-06-08 ENCOUNTER — Encounter: Payer: Self-pay | Admitting: Family Medicine

## 2021-06-08 DIAGNOSIS — I4891 Unspecified atrial fibrillation: Secondary | ICD-10-CM | POA: Insufficient documentation

## 2021-06-08 DIAGNOSIS — I509 Heart failure, unspecified: Secondary | ICD-10-CM | POA: Insufficient documentation

## 2021-06-08 NOTE — Assessment & Plan Note (Signed)
Continue current regimen. Needs to see endocrinology. Will put in referral to see if he can get in more locally- but advised him to keep his appointment with endocrinology in [place].  ?

## 2021-06-08 NOTE — Assessment & Plan Note (Signed)
HR under good control. Follow up with cardiology as scheduled. Continue to monitor. Call with any concerns.  ?

## 2021-06-08 NOTE — Assessment & Plan Note (Signed)
Resolved. Needs to see endocrinology. Will put in referral to see if he can get in more locally- but advised him to keep his appointment with endocrinology in [place].  ?

## 2021-06-08 NOTE — Assessment & Plan Note (Signed)
Consider medication. Continue to monitor.  ?

## 2021-06-08 NOTE — Assessment & Plan Note (Signed)
Euvolemic today. Checking labs today. Follow up with cardiology as scheduled. Continue to monitor. Call with any concerns.  ?

## 2021-06-16 DIAGNOSIS — E0521 Thyrotoxicosis with toxic multinodular goiter with thyrotoxic crisis or storm: Secondary | ICD-10-CM | POA: Diagnosis not present

## 2021-06-24 ENCOUNTER — Other Ambulatory Visit: Payer: Self-pay | Admitting: Family Medicine

## 2021-06-24 NOTE — Telephone Encounter (Signed)
Copied from [place] [id number]. Topic: Quick Communication - Rx Refill/Question ?>> Jun 24, 2021 12:26 PM Yvette Rack wrote: ?Medication: amiodarone (PACERONE) 200 MG tablet and empagliflozin (JARDIANCE) 10 MG TABS tablet ? ?Has the patient contacted their pharmacy? Yes.   ?(Agent: If no, request that the patient contact the pharmacy for the refill. If patient does not wish to contact the pharmacy document the reason why and proceed with request.) ?(Agent: If yes, when and what did the pharmacy advise?) ? ?Preferred Pharmacy (with phone number or street name): [facility], [place]  ?Phone: [phone] ?Fax: [phone] ? ?Has the patient been seen for an appointment in the last year OR does the patient have an upcoming appointment? Yes.   ? ?Agent: Please be advised that RX refills may take up to 3 business days. We ask that you follow-up with your pharmacy. ?

## 2021-06-25 DIAGNOSIS — I5020 Unspecified systolic (congestive) heart failure: Secondary | ICD-10-CM | POA: Diagnosis not present

## 2021-06-25 DIAGNOSIS — I480 Paroxysmal atrial fibrillation: Secondary | ICD-10-CM | POA: Diagnosis not present

## 2021-06-26 NOTE — Telephone Encounter (Signed)
Requested medication (s) are due for refill today:   Not sure  Both are historical medications ? ?Requested medication (s) are on the active medication list:   Yes as historical ? ?Future visit scheduled:   Yes in 3 days with Dr. [name] ? ? ?Last ordered: Amiodarone 05/29/2021 historical;    Jardiance 05/22/2021 historical ? ?Returned because both medications are historical.  ? ?Requested Prescriptions  ?Pending Prescriptions Disp Refills  ? amiodarone (PACERONE) 200 MG tablet    ?  Sig: Take 2 tabs 2x daily for 3 days, then 1 tab 1x daily for 27 days  ?  ? Not Delegated - Cardiovascular: Antiarrhythmic Agents - amiodarone Failed - 06/25/2021 10:43 AM  ?  ?  Failed - This refill cannot be delegated  ?  ?  Failed - Manual Review: Eye exam recommended every 12 months  ?  ?  Failed - TSH in normal range and within 360 days  ?  TSH  ?Date Value Ref Range Status  ?05/29/2021 <0.005 (L) 0.450 - 4.500 uIU/mL Final  ?  ?  ?  ?  Failed - K in normal range and within 180 days  ?  Potassium  ?Date Value Ref Range Status  ?05/29/2021 5.6 (H) 3.5 - 5.2 mmol/L Final  ?  ?  ?  ?  Failed - ALT in normal range and within 180 days  ?  ALT  ?Date Value Ref Range Status  ?05/29/2021 65 (H) 0 - 44 IU/L Final  ?  ?  ?  ?  Failed - Patient had chest x-ray within the last 6 months  ?  ?  Passed - Mg Level in normal range and within 360 days  ?  Magnesium  ?Date Value Ref Range Status  ?05/15/2021 2.3 1.7 - 2.4 mg/dL Final  ?  Comment:  ?  Performed at [facility], [address] [place], [place] [address]  ?  ?  ?  ?  Passed - AST in normal range and within 180 days  ?  AST  ?Date Value Ref Range Status  ?05/29/2021 34 0 - 40 IU/L Final  ?  ?  ?  ?  Passed - Patient had ECG in the last 180 days  ?  ?  Passed - Patient is not pregnant  ?  ?  Passed - Last BP in normal range  ?  BP Readings from Last 1 Encounters:  ?05/29/21 109/64  ?  ?  ?  ?  Passed - Last Heart Rate in normal range  ?  Pulse Readings from Last 1 Encounters:   ?05/29/21 62  ?  ?  ?  ?  Passed - Valid encounter within last 6 months  ?  Recent Outpatient Visits   ? ?      ? 4 weeks ago Hyperthyroidism  ? [place], DO  ? 3 months ago Routine general medical examination at a health care facility  ? [place], DO  ? 10 months ago Hyperthyroidism  ? [place], DO  ? 11 months ago Hyperthyroidism  ? [place], DO  ? 1 year ago Hyperthyroidism  ? [place], [place] P, DO  ? ?  ?  ?Future Appointments   ? ?        ? In 3 days Valerie Roys, DO [place], [facility]  ? In 6  months  [employer], [facility]  ? ?  ? ?  ?  ?  ? empagliflozin (JARDIANCE) 10 MG TABS tablet 30 tablet 11  ?  Sig: Take 1 tablet (10 mg total) by mouth daily.  ?  ? Endocrinology:  Diabetes - SGLT2 Inhibitors Failed - 06/25/2021 10:43 AM  ?  ?  Failed - Cr in normal range and within 360 days  ?  Creatinine, Ser  ?Date Value Ref Range Status  ?05/29/2021 1.47 (H) 0.76 - 1.27 mg/dL Final  ?  ?  ?  ?  Failed - HBA1C is between 0 and 7.9 and within 180 days  ?  HB A1C (BAYER DCA - WAIVED)  ?Date Value Ref Range Status  ?08/22/2018 5.4 <7.0 % Final  ?  Comment:  ?                                        Diabetic Adult            <7.0 ?                                      Healthy Adult        4.3 - 5.7 ?                                                          (DCCT/NGSP) ?American Diabetes Association's Summary of Glycemic Recommendations ?for Adults with Diabetes: Hemoglobin A1c <7.0%. More stringent ?glycemic goals (A1c <6.0%) may further reduce complications at the ?cost of increased risk of hypoglycemia. ?  ?  ?  ?  ?  Failed - eGFR in normal range and within 360 days  ?  GFR calc Af Amer  ?Date Value Ref Range Status  ?02/25/2020 104 >59 mL/min/1.73 Final  ?  Comment:  ?  **In accordance with recommendations from the NKF-ASN Task force,** ?   [facility] is in the process of updating its eGFR calculation to the ?  2021 CKD-EPI creatinine equation that estimates kidney function ?  without a race variable. ?  ? ?GFR, Estimated  ?Date Value Ref Range Status  ?05/15/2021 32 (L) >60 mL/min Final  ?  Comment:  ?  (NOTE) ?Calculated using the CKD-EPI Creatinine Equation (2021) ?  ? ?eGFR  ?Date Value Ref Range Status  ?05/29/2021 53 (L) >59 mL/min/1.73 Final  ?  ?  ?  ?  Passed - Valid encounter within last 6 months  ?  Recent Outpatient Visits   ? ?      ? 4 weeks ago Hyperthyroidism  ? [place], DO  ? 3 months ago Routine general medical examination at a health care facility  ? [place], DO  ? 10 months ago Hyperthyroidism  ? [place], DO  ? 11 months ago Hyperthyroidism  ? [place], DO  ? 1 year ago Hyperthyroidism  ? [place], [place] P, DO  ? ?  ?  ?Future Appointments   ? ?        ?  In 3 days Valerie Roys, DO [place], [facility]  ? In 6 months  [place], [facility]  ? ?  ? ?  ?  ?  ? ?

## 2021-06-28 NOTE — Telephone Encounter (Signed)
Needs to get from his cardiologist. 

## 2021-06-29 ENCOUNTER — Other Ambulatory Visit: Payer: Self-pay

## 2021-06-29 ENCOUNTER — Encounter: Payer: Self-pay | Admitting: Family Medicine

## 2021-06-29 ENCOUNTER — Ambulatory Visit (INDEPENDENT_AMBULATORY_CARE_PROVIDER_SITE_OTHER): Payer: Medicare (Managed Care) | Admitting: Family Medicine

## 2021-06-29 VITALS — BP 106/67 | HR 67 | Temp 98.0°F | Wt 158.6 lb

## 2021-06-29 DIAGNOSIS — I4891 Unspecified atrial fibrillation: Secondary | ICD-10-CM | POA: Diagnosis not present

## 2021-06-29 DIAGNOSIS — I509 Heart failure, unspecified: Secondary | ICD-10-CM

## 2021-06-29 DIAGNOSIS — E0590 Thyrotoxicosis, unspecified without thyrotoxic crisis or storm: Secondary | ICD-10-CM | POA: Diagnosis not present

## 2021-06-29 NOTE — Assessment & Plan Note (Signed)
Following with endocrinology. Labs have been improving. Weight improving. Follow up with endocrine on 4/17. Call with any concerns.  ?

## 2021-06-29 NOTE — Progress Notes (Signed)
? ?BP 106/67   Pulse 67   Temp 98 ?F (36.7 ?C)   Wt 158 lb 9.6 oz (71.9 kg)   SpO2 97%   BMI 20.92 kg/m?   ? ?Subjective:  ? ? Patient ID: Tyler Cohen, male    DOB: [birth year], 66 y.o.   MRN: [id number] ? ?HPI: ?Tyler Cohen is a 66 y.o. male ? ?Chief Complaint  ?Patient presents with  ? Congestive Heart Failure  ? Hyperthyroidism  ? Hyperlipidemia  ? ?Taaj presents today for follow up on his CHF and thyroid. He saw cardiology at the beginning of March. They are getting an echo on him and have continued him on his digoxin and amiodarone. He got his echo last week, his results are not yet available. He notes that he has been out of the amiodarone for a couple of days. He has generally been feeling well. He has not been SOB. No CP. No swelling. He notes that he is feeling better.  ? ?Saw endocrinology in 3/14. They are working to get his thyroid levels better with the plan to do a thyroid biopsy. Due to see them again in about 3 weeks. TSH has been improving. 0.03 up from <0.005. he notes that he is tolerating his medicine well.  ? ?HYPERTHYROIDISM ?Thyroid control status: improving ?Satisfied with current treatment? yes ?Medication side effects: no ?Medication compliance: excellent compliance ?Recent dose adjustment:no ?Fatigue: no ?Cold intolerance: no ?Heat intolerance: no ?Weight gain: no ?Weight loss: no ?Constipation: no ?Diarrhea/loose stools: no ?Palpitations: no ?Lower extremity edema: no ?Anxiety/depressed mood: no ? ? ?Relevant past medical, surgical, family and social history reviewed and updated as indicated. Interim medical history since our last visit reviewed. ?Allergies and medications reviewed and updated. ? ?Review of Systems  ?Constitutional: Negative.   ?Respiratory: Negative.    ?Cardiovascular: Negative.   ?Gastrointestinal: Negative.   ?Musculoskeletal: Negative.   ?Neurological: Negative.   ?Psychiatric/Behavioral: Negative.    ? ?Per HPI unless specifically indicated above ? ?    ?Objective:  ?  ?BP 106/67   Pulse 67   Temp 98 ?F (36.7 ?C)   Wt 158 lb 9.6 oz (71.9 kg)   SpO2 97%   BMI 20.92 kg/m?   ?Wt Readings from Last 3 Encounters:  ?06/29/21 158 lb 9.6 oz (71.9 kg)  ?05/29/21 142 lb 12.8 oz (64.8 kg)  ?05/15/21 161 lb 2.5 oz (73.1 kg)  ?  ?Physical Exam ?Vitals and nursing note reviewed.  ?Constitutional:   ?   General: He is not in acute distress. ?   Appearance: Normal appearance. He is normal weight. He is not ill-appearing, toxic-appearing or diaphoretic.  ?HENT:  ?   Head: Normocephalic and atraumatic.  ?   Right Ear: External ear normal.  ?   Left Ear: External ear normal.  ?   Nose: Nose normal.  ?   Mouth/Throat:  ?   Mouth: Mucous membranes are moist.  ?   Pharynx: Oropharynx is clear.  ?Eyes:  ?   General: No scleral icterus.    ?   Right eye: No discharge.     ?   Left eye: No discharge.  ?   Extraocular Movements: Extraocular movements intact.  ?   Conjunctiva/sclera: Conjunctivae normal.  ?   Pupils: Pupils are equal, round, and reactive to light.  ?Cardiovascular:  ?   Rate and Rhythm: Normal rate and regular rhythm.  ?   Pulses: Normal pulses.  ?   Heart sounds: Normal heart  sounds. No murmur heard. ?  No friction rub. No gallop.  ?Pulmonary:  ?   Effort: Pulmonary effort is normal. No respiratory distress.  ?   Breath sounds: Normal breath sounds. No stridor. No wheezing, rhonchi or rales.  ?Chest:  ?   Chest wall: No tenderness.  ?Musculoskeletal:     ?   General: Normal range of motion.  ?   Cervical back: Normal range of motion and neck supple.  ?Skin: ?   General: Skin is warm and dry.  ?   Capillary Refill: Capillary refill takes less than 2 seconds.  ?   Coloration: Skin is not jaundiced or pale.  ?   Findings: No bruising, erythema, lesion or rash.  ?Neurological:  ?   General: No focal deficit present.  ?   Mental Status: He is alert and oriented to person, place, and time. Mental status is at baseline.  ?Psychiatric:     ?   Mood and Affect: Mood normal.      ?   Behavior: Behavior normal.     ?   Thought Content: Thought content normal.     ?   Judgment: Judgment normal.  ? ? ?Results for orders placed or performed in visit on 05/29/21  ?Comprehensive metabolic panel  ?Result Value Ref Range  ? Glucose 76 70 - 99 mg/dL  ? BUN 32 (H) 8 - 27 mg/dL  ? Creatinine, Ser 1.47 (H) 0.76 - 1.27 mg/dL  ? eGFR 53 (L) >59 mL/min/1.73  ? BUN/Creatinine Ratio 22 10 - 24  ? Sodium 133 (L) 134 - 144 mmol/L  ? Potassium 5.6 (H) 3.5 - 5.2 mmol/L  ? Chloride 98 96 - 106 mmol/L  ? CO2 20 20 - 29 mmol/L  ? Calcium 9.9 8.6 - 10.2 mg/dL  ? Total Protein 7.7 6.0 - 8.5 g/dL  ? Albumin 4.7 3.8 - 4.8 g/dL  ? Globulin, Total 3.0 1.5 - 4.5 g/dL  ? Albumin/Globulin Ratio 1.6 1.2 - 2.2  ? Bilirubin Total 0.8 0.0 - 1.2 mg/dL  ? Alkaline Phosphatase 85 44 - 121 IU/L  ? AST 34 0 - 40 IU/L  ? ALT 65 (H) 0 - 44 IU/L  ?CBC with Differential/Platelet  ?Result Value Ref Range  ? WBC 8.1 3.4 - 10.8 x10E3/uL  ? RBC 5.34 4.14 - 5.80 x10E6/uL  ? Hemoglobin 17.1 13.0 - 17.7 g/dL  ? Hematocrit 50.9 37.5 - 51.0 %  ? MCV 95 79 - 97 fL  ? MCH 32.0 26.6 - 33.0 pg  ? MCHC 33.6 31.5 - 35.7 g/dL  ? RDW 13.1 11.6 - 15.4 %  ? Platelets 256 150 - 450 x10E3/uL  ? Neutrophils 62 Not Estab. %  ? Lymphs 30 Not Estab. %  ? Monocytes 5 Not Estab. %  ? Eos 2 Not Estab. %  ? Basos 1 Not Estab. %  ? Neutrophils Absolute 5.1 1.4 - 7.0 x10E3/uL  ? Lymphocytes Absolute 2.4 0.7 - 3.1 x10E3/uL  ? Monocytes Absolute 0.4 0.1 - 0.9 x10E3/uL  ? EOS (ABSOLUTE) 0.1 0.0 - 0.4 x10E3/uL  ? Basophils Absolute 0.1 0.0 - 0.2 x10E3/uL  ? Immature Granulocytes 0 Not Estab. %  ? Immature Grans (Abs) 0.0 0.0 - 0.1 x10E3/uL  ?Thyroid Panel With TSH  ?Result Value Ref Range  ? TSH <0.005 (L) 0.450 - 4.500 uIU/mL  ? T4, Total 10.2 4.5 - 12.0 ug/dL  ? T3 Uptake Ratio 29 24 - 39 %  ? Free Thyroxine Index  3.0 1.2 - 4.9  ? ?   ?Assessment & Plan:  ? ?Problem List Items Addressed This Visit   ? ?  ? Cardiovascular and Mediastinum  ? Atrial fibrillation ([place])  ?   Euvolemic today. Has been out of some of his medicine from cardiology- called to check on his refills to make sure he can get them today. Continue to monitor.  ?  ?  ? Relevant Medications  ? spironolactone (ALDACTONE) 25 MG tablet  ? apixaban (ELIQUIS) 5 MG TABS tablet  ? amiodarone (PACERONE) 200 MG tablet  ? Congestive heart failure ([place])  ?  Euvolemic today. Has been out of some of his medicine from cardiology- called to check on his refills to make sure he can get them today. Continue to monitor.  ?  ?  ? Relevant Medications  ? spironolactone (ALDACTONE) 25 MG tablet  ? apixaban (ELIQUIS) 5 MG TABS tablet  ? amiodarone (PACERONE) 200 MG tablet  ?  ? Endocrine  ? Hyperthyroidism - Primary  ?  Following with endocrinology. Labs have been improving. Weight improving. Follow up with endocrine on 4/17. Call with any concerns.  ?  ?  ? Relevant Medications  ? methimazole (TAPAZOLE) 5 MG tablet  ?  ? ?Follow up plan: ?Return in about 3 months (around 09/29/2021). ? ? ?>30 minutes spent with patient and coordination of care today. ? ? ?

## 2021-06-29 NOTE — Assessment & Plan Note (Signed)
Euvolemic today. Has been out of some of his medicine from cardiology- called to check on his refills to make sure he can get them today. Continue to monitor.  ?

## 2021-07-14 ENCOUNTER — Telehealth: Payer: Self-pay

## 2021-07-14 ENCOUNTER — Telehealth: Payer: Medicare HMO

## 2021-07-14 NOTE — Telephone Encounter (Signed)
?  Care Management  ? ?Follow Up Note ? ? ?07/14/2021 ?Name: Tyler Cohen MRN: [id number] DOB: 1956/01/11 ? ? ?Referred by: Valerie Roys, DO ?Reason for referral : Chronic Care Management (RNCM: Follow up for Chronic Disease Management and Care Coordination Needs) ? ? ?An unsuccessful telephone outreach was attempted today. The patient was referred to the case management team for assistance with care management and care coordination.  ? ?Follow Up Plan: The care management team will reach out to the patient again over the next 30 days.  ?Noreene Larsson RN, MSN, CCM ?Community Care Coordinator ?[facility] ?[place] ?Mobile: [phone]  ?

## 2021-07-17 ENCOUNTER — Other Ambulatory Visit: Payer: Self-pay | Admitting: Family Medicine

## 2021-07-17 ENCOUNTER — Telehealth: Payer: Self-pay | Admitting: Family Medicine

## 2021-07-17 NOTE — Telephone Encounter (Signed)
Can we please check with the pharmacy/endocrine to make sure he doesn't run out of his medicine? ?

## 2021-07-17 NOTE — Telephone Encounter (Signed)
Per [facility], there is a script for methimazole 10 mg. Should patient be taking '5mg'$  or 10 mg?  ?

## 2021-07-17 NOTE — Telephone Encounter (Signed)
It looks like he should be on 2 '5mg'$  methimazoles so he should be on 10 ?

## 2021-07-17 NOTE — Telephone Encounter (Signed)
Medication refilled over the phone with [facility]. Attempted to contact patient no answer unable to LVM.  ?

## 2021-07-17 NOTE — Telephone Encounter (Signed)
Patient walked in needing his medication. Patient states he only has enough to last him two days. Doctor [name] told patient to contact his endocrinologist and let him know we would also check on this for him. The medication is his methimazole (TAPAZOLE) 5 MG tablet.  ?

## 2021-07-17 NOTE — Telephone Encounter (Signed)
Medication discontinued 05/16/21.  ?Requested Prescriptions  ?Refused Prescriptions Disp Refills  ?? methimazole (TAPAZOLE) 10 MG tablet [Pharmacy Med Name: METHIMAZOLE '10MG'$  TABLETS] 180 tablet 1  ?  Sig: TAKE 1 TABLET(10 MG) BY MOUTH TWICE DAILY  ?  ? Not Delegated - Endocrinology:  Hyperthyroid Agents Failed - 07/17/2021 10:51 AM  ?  ?  Failed - This refill cannot be delegated  ?  ?  Failed - TSH in normal range and within 180 days  ?  TSH  ?Date Value Ref Range Status  ?05/29/2021 <0.005 (L) 0.450 - 4.500 uIU/mL Final  ?   ?  ?  Failed - T3 Total in normal range and within 180 days  ?  T3, Free  ?Date Value Ref Range Status  ?05/13/2021 8.1 (H) 2.0 - 4.4 pg/mL Final  ?  Comment:  ?  (NOTE) ?Performed At: [facility] ?[address] [place], [place] [id number] ?Rush Farmer MD [id number] ?  ?   ?  ?  Passed - T4 free in normal range and within 180 days  ?  T4, Total  ?Date Value Ref Range Status  ?05/29/2021 10.2 4.5 - 12.0 ug/dL Final  ? ?Free T4  ?Date Value Ref Range Status  ?05/13/2021 3.49 (H) 0.61 - 1.12 ng/dL Final  ?  Comment:  ?  (NOTE) ?Biotin ingestion may interfere with free T4 tests. If the results are ?inconsistent with the TSH level, previous test results, or the ?clinical presentation, then consider biotin interference. If needed, ?order repeat testing after stopping biotin. ?Performed at [facility], [place], ?[place] [address] ?  ?   ?  ?  Passed - Valid encounter within last 6 months  ?  Recent Outpatient Visits   ?      ? 2 weeks ago Hyperthyroidism  ? [place], DO  ? 1 month ago Hyperthyroidism  ? [place], DO  ? 4 months ago Routine general medical examination at a health care facility  ? [place], DO  ? 11 months ago Hyperthyroidism  ? [place], DO  ? 1 year ago Hyperthyroidism  ? [place], [place] P,  DO  ?  ?  ?Future Appointments   ?        ? In 2 months [name], Barb Merino, DO [place], [facility]  ? In 5 months  [place], [facility]  ?  ? ?  ?  ?  ? ?

## 2021-07-20 DIAGNOSIS — E042 Nontoxic multinodular goiter: Secondary | ICD-10-CM | POA: Diagnosis not present

## 2021-07-20 DIAGNOSIS — E0590 Thyrotoxicosis, unspecified without thyrotoxic crisis or storm: Secondary | ICD-10-CM | POA: Diagnosis not present

## 2021-07-21 ENCOUNTER — Ambulatory Visit: Payer: Medicare HMO | Admitting: Internal Medicine

## 2021-07-22 ENCOUNTER — Telehealth: Payer: Medicare (Managed Care)

## 2021-07-22 ENCOUNTER — Ambulatory Visit (INDEPENDENT_AMBULATORY_CARE_PROVIDER_SITE_OTHER): Payer: Medicare (Managed Care)

## 2021-07-22 DIAGNOSIS — E0521 Thyrotoxicosis with toxic multinodular goiter with thyrotoxic crisis or storm: Secondary | ICD-10-CM

## 2021-07-22 DIAGNOSIS — E0591 Thyrotoxicosis, unspecified with thyrotoxic crisis or storm: Secondary | ICD-10-CM

## 2021-07-22 DIAGNOSIS — E0590 Thyrotoxicosis, unspecified without thyrotoxic crisis or storm: Secondary | ICD-10-CM

## 2021-07-22 DIAGNOSIS — Z59819 Housing instability, housed unspecified: Secondary | ICD-10-CM

## 2021-07-22 DIAGNOSIS — E7800 Pure hypercholesterolemia, unspecified: Secondary | ICD-10-CM

## 2021-07-22 NOTE — Chronic Care Management (AMB) (Signed)
?Chronic Care Management  ? ?CCM RN Visit Note ? ?07/22/2021 ?Name: Tyler Cohen MRN: [id number] DOB: 10/12/1955 ? ?Subjective: ?Tyler Cohen is a 66 y.o. year old male who is a primary care patient of Valerie Roys, DO. The care management team was consulted for assistance with disease management and care coordination needs.   ? ?Engaged with patient by telephone for follow up visit in response to provider referral for case management and/or care coordination services.  ? ?Consent to Services:  ?The patient was given information about Chronic Care Management services, agreed to services, and gave verbal consent prior to initiation of services.  Please see initial visit note for detailed documentation.  ? ?Patient agreed to services and verbal consent obtained.  ? ?Assessment: Review of patient past medical history, allergies, medications, health status, including review of consultants reports, laboratory and other test data, was performed as part of comprehensive evaluation and provision of chronic care management services.  ? ?SDOH (Social Determinants of Health) assessments and interventions performed:   ? ?CCM Care Plan ? ?Allergies  ?Allergen Reactions  ? Lactose Intolerance (Gi)   ? ? ?Outpatient Encounter Medications as of 07/22/2021  ?Medication Sig  ? amiodarone (PACERONE) 200 MG tablet Take by mouth.  ? digoxin (LANOXIN) 0.25 MG tablet Take 1 tablet (0.25 mg total) by mouth daily.  ? empagliflozin (JARDIANCE) 10 MG TABS tablet Take 1 tablet by mouth daily.  ? furosemide (LASIX) 40 MG tablet Take 1 tablet by mouth daily.  ? polyethylene glycol (MIRALAX / GLYCOLAX) 17 g packet Take 17 g by mouth daily as needed for moderate constipation.  ? ?No facility-administered encounter medications on file as of 07/22/2021.  ? ? ?Patient Active Problem List  ? Diagnosis Date Noted  ? Atrial fibrillation (Tyler Cohen) 06/08/2021  ? Congestive heart failure ([place]) 06/08/2021  ? Malnutrition of moderate degree 05/15/2021  ?  Thyroid crisis or storm 05/13/2021  ? Aortic atherosclerosis ([place]) 06/25/2020  ? BPH (benign prostatic hyperplasia) 10/27/2018  ? Multinodular goiter 12/08/2017  ? Hyperthyroidism 12/08/2017  ? Left thyroid nodule 05/04/2017  ? Adrenal mass ([place]) 05/04/2017  ? Renal mass 01/17/2017  ? Liver lesion 11/29/2016  ? Advanced care planning/counseling discussion 11/02/2016  ? Digital mucinous cyst of finger of right hand 11/02/2016  ? Benign neoplasm of sigmoid colon   ? Psoriasis 09/03/2015  ? Tobacco abuse   ? Macrocytosis   ? Hyperlipidemia 08/29/2014  ? Dental disease 08/29/2014  ? ? ?Conditions to be addressed/monitored:HTN, HLD, Homelessness, and Hyperthyroidism with hospitalization for thyroid storm x 2 in February 2023 ? ?Care Plan : RNCM: General Plan of Care (Adult) for Chronic Disease Management and Care Coordination Needs  ?Updates made by Vanita Ingles, RN since 07/22/2021 12:00 AM  ?  ? ?Problem: RNCM: Development of Plan of Care for Chronic Disease Management (HTN, HLD, Hyperthyroidism, Homelessness, Smoker)   ?Priority: High  ?  ? ?Long-Range Goal: RNCM: Effective Management of Plan of Care for Chronic Disease Management (HTN, HLD, Hyperthyroidism, Homelessness, Smoker)   ?Start Date: 02/02/2021  ?Expected End Date: 02/02/2022  ?Recent Progress: On track  ?Priority: High  ?Note:   ?Current Barriers:  ?Knowledge Deficits related to plan of care for management of HTN, HLD, Homelessness, and Hyperthyroidism and smoker   ?Care Coordination needs related to Limited social support, Housing barriers, and Social Isolation  ?Chronic Disease Management support and education needs related to HTN, HLD, Homelessness, and Hyperthyroidism, and smoker  ?Lacks caregiver support.  ?Film/video editor.  ?  Homeless, Is currently living with family, his wife is in a nursing facility,the patient asking for help with housing- 07-22-2021: The patient has been placed on wait list and is checking periodically. Continue to live with  family currently.  ? ?RNCM Clinical Goal(s):  ?Patient will verbalize understanding of plan for management of HTN, HLD, Homelessness, Tobacco Use, and Hyperthyroidism as evidenced by seeing pcp on a regular basis, regular outreaches with the CCM team, following up on resources provided  ?take all medications exactly as prescribed and will call provider for medication related questions as evidenced by compliance with mediation, no missed doseages     ?demonstrate understanding of rationale for each prescribed medication as evidenced by taking as prescribed, calling before refills are needed    ?attend all scheduled medical appointments: with pcp as evidenced by keeping appointments and calling if appointments need to be changed        ?demonstrate improved and ongoing adherence to prescribed treatment plan for HTN, HLD, Homelessness, Tobacco Use, and Hyperthyroidism  as evidenced by Finding adequate housing, working with the CCM team to meet needs  ?continue to work with Consulting civil engineer and/or Social Worker to address care management and care coordination needs related to HTN, HLD, Tobacco Use, and hyperthyroidism as evidenced by adherence to CM Team Scheduled appointments     ?work with Education officer, museum to address Financial constraints related to the need for stable housing, Limited social support, and Housing barriers related to the management of HTN, HLD, Homelessness, Tobacco Use, and hyperthyroidism as evidenced by review of EMR and patient or Education officer, museum report     ?work with Data processing manager care guide to address needs related to Housing barriers as evidenced by patient and/or community resource care guide support    ?demonstrate ongoing self health care management ability for effective management of chronic conditions as evidenced by     through collaboration with Consulting civil engineer, provider, and care team.  ? ?Interventions: ?1:1 collaboration with primary care provider regarding development and update of  comprehensive plan of care as evidenced by provider attestation and co-signature ?Inter-disciplinary care team collaboration (see longitudinal plan of care) ?Evaluation of current treatment plan related to  self management and patient's adherence to plan as established by provider ? ? ?SDOH Barriers (Status: Goal on track: NO.) Long Term Goal  ?Patient interviewed and SDOH assessment performed ?       ?Patient interviewed and appropriate assessments performed ?Provided patient with information about checking email as the care guides had sent him a 4 page document with housing resources on it for help with housing in [place]. 14-Mar-2021: His roommate passed away on 03-12-2021 and he is currently staying in the home but is leaving. The landlord wants >900.00 and it has mold. The patient states he is not staying there. He does have somewhere to stay until he finds somewhere to go. He has received information in mail about resources for housing and is working on trying to find a new place to live. 05-12-2021: The patient is still homeless. His cousin passed away and he is living with different relatives now. He still is looking for stable housing. States he is on the waiting list. Encouraged him to call and see what his status on the waiting list is. The patient wants to find a house so his wife can come out of the nursing home and back home. Education and support given. 07-22-2021: The patient was in the hospital x 2 in February.  He is currently still living with family and his wife is still at a SNF. He checks frequently and is on the waiting list at several places. The patient states he is doing well right now and feeling much better.  ?Discussed plans with patient for ongoing care management follow up and provided patient with direct contact information for care management team ?Advised patient to look at email sent to the patient last week. To call the office for changes in SDOH, Questions or concerns. Also  advised the patient that the [facility] would collaborate with LCSW for additional support for housing ?Provided education to patient/caregiver regarding level of care options. ?07-22-2021: New care guide referral

## 2021-07-22 NOTE — Patient Instructions (Signed)
Visit Information ? ?Thank you for taking time to visit with me today. Please don't hesitate to contact me if I can be of assistance to you before our next scheduled telephone appointment. ? ?Following are the goals we discussed today:  ?RNCM Clinical Goal(s):  ?Patient will verbalize understanding of plan for management of HTN, HLD, Homelessness, Tobacco Use, and Hyperthyroidism as evidenced by seeing pcp on a regular basis, regular outreaches with the CCM team, following up on resources provided  ?take all medications exactly as prescribed and will call provider for medication related questions as evidenced by compliance with mediation, no missed doseages     ?demonstrate understanding of rationale for each prescribed medication as evidenced by taking as prescribed, calling before refills are needed    ?attend all scheduled medical appointments: with pcp as evidenced by keeping appointments and calling if appointments need to be changed        ?demonstrate improved and ongoing adherence to prescribed treatment plan for HTN, HLD, Homelessness, Tobacco Use, and Hyperthyroidism  as evidenced by Finding adequate housing, working with the CCM team to meet needs  ?continue to work with Consulting civil engineer and/or Social Worker to address care management and care coordination needs related to HTN, HLD, Tobacco Use, and hyperthyroidism as evidenced by adherence to CM Team Scheduled appointments     ?work with Education officer, museum to address Financial constraints related to the need for stable housing, Limited social support, and Housing barriers related to the management of HTN, HLD, Homelessness, Tobacco Use, and hyperthyroidism as evidenced by review of EMR and patient or Education officer, museum report     ?work with Data processing manager care guide to address needs related to Housing barriers as evidenced by patient and/or community resource care guide support    ?demonstrate ongoing self health care management ability for effective management  of chronic conditions as evidenced by     through collaboration with Consulting civil engineer, provider, and care team.  ?  ?Interventions: ?1:1 collaboration with primary care provider regarding development and update of comprehensive plan of care as evidenced by provider attestation and co-signature ?Inter-disciplinary care team collaboration (see longitudinal plan of care) ?Evaluation of current treatment plan related to  self management and patient's adherence to plan as established by provider ?  ?  ?SDOH Barriers (Status: Goal on track: NO.) Long Term Goal  ?Patient interviewed and SDOH assessment performed ?       ?Patient interviewed and appropriate assessments performed ?Provided patient with information about checking email as the care guides had sent him a 4 page document with housing resources on it for help with housing in [place]. 04/05/21: His roommate passed away on Apr 03, 2021 and he is currently staying in the home but is leaving. The landlord wants >900.00 and it has mold. The patient states he is not staying there. He does have somewhere to stay until he finds somewhere to go. He has received information in mail about resources for housing and is working on trying to find a new place to live. 05-12-2021: The patient is still homeless. His cousin passed away and he is living with different relatives now. He still is looking for stable housing. States he is on the waiting list. Encouraged him to call and see what his status on the waiting list is. The patient wants to find a house so his wife can come out of the nursing home and back home. Education and support given. 07-22-2021: The patient was in the hospital  x 2 in February. He is currently still living with family and his wife is still at a SNF. He checks frequently and is on the waiting list at several places. The patient states he is doing well right now and feeling much better.  ?Discussed plans with patient for ongoing care management follow up  and provided patient with direct contact information for care management team ?Advised patient to look at email sent to the patient last week. To call the office for changes in SDOH, Questions or concerns. Also advised the patient that the [facility] would collaborate with LCSW for additional support for housing ?Provided education to patient/caregiver regarding level of care options. ?07-22-2021: New care guide referral for transportation needs. The patient states that he cannot afford to keep his car and is having to get rid of it. He is open to talking to care guides about transportation resources in [place].  ?  ?  ?  ?Hyperthyroidism  (Status: Goal on Track (progressing): YES.) Long Term Goal  ?Evaluation of current treatment plan related to  Hyperthyroidism ,  self-management and patient's adherence to plan as established by provider. 05-12-2021: Denies any issues with his hyperthyroidism. Will continue to monitor. 07-22-2021: Was in the hospital x 2 in February for thyrotoxicosis with toxic multinodular goiter and thyroid storm. The patient states he is feeling much better and is following up with endocrinology on a consistent basis. He saw endocrinology on 4-11 and has an appointment at [facility] on 5-11 and [place] on 08-11-2021. The patient states he is being closely monitored and knows to call for changes in his condition. Denies any acute findings today. Will continue to monitor.  ?Discussed plans with patient for ongoing care management follow up and provided patient with direct contact information for care management team ?Advised patient to call the office for changes in conditions, questions or concerns; ?Provided education to patient re: hyperthyroidism, sx and sx to monitor for ; ?Reviewed medications with patient and discussed compliance. 07-22-2021: The patient states compliance with his medications. Denies issues with obtaining medications ; ?Discussed plans with patient for ongoing care management follow up  and provided patient with direct contact information for care management team; ?Screening for signs and symptoms of depression related to chronic disease state;  ?Assessed social determinant of health barriers; 07-22-2021: New referral place today with the care guides for assistance with transportation needs.  ?  ?Hyperlipidemia:  (Status: Goal on Track (progressing): YES.) ?     ?Lab Results  ?Component Value Date  ?  CHOL 113 03/19/2021  ?  HDL 33 (L) 03/19/2021  ?  [place] 65 03/19/2021  ?  TRIG 70 03/19/2021  ?  CHOLHDL 3.4 11/08/2017  ?  ?  ?Medication review performed; medication list updated in electronic medical record.  ?Provider established cholesterol goals reviewed. 05-12-2021: Is at goal. Praised for good results; ?Counseled on importance of regular laboratory monitoring as prescribed. 07-22-2021: Has labs done on a regular basis; ?Provided HLD educational materials; ?Reviewed role and benefits of statin for ASCVD risk reduction; ?Discussed strategies to manage statin-induced myalgias; ?Reviewed importance of limiting foods high in cholesterol. 07-22-2021: Education and review ; ?Reviewed exercise goals and target of 150 minutes per week; ?  ?Hypertension: (Status: Goal on Track (progressing): YES.) ?Last practice recorded BP readings:  ?   ?BP Readings from Last 3 Encounters:  ?06/29/21 106/67  ?05/29/21 109/64  ?05/16/21 101/78  ?Most recent eGFR/CrCl:  ?     ?Lab Results  ?Component Value  Date  ?  EGFR 53 (L) 05/29/2021  ?  No components found for: CRCL ?  ?Evaluation of current treatment plan related to hypertension self management and patient's adherence to plan as established by provider. 07-22-2021: Denies any issues with heart health. Will continue to monitor.   Has been in the hospital x 2 in February due to thyroid storm. The patient with decline in EGFR. Education and support given.  ?Provided education to patient re: stroke prevention, s/s of heart attack and stroke; ?Reviewed prescribed diet  heart healthy. 07-22-2021: Education and support given ?Reviewed medications with patient and discussed importance of compliance. 07-22-2021: The patient is compliant with medications. Denies any new medi

## 2021-07-27 ENCOUNTER — Telehealth: Payer: Self-pay | Admitting: *Deleted

## 2021-07-27 NOTE — Telephone Encounter (Signed)
? ?  Telephone encounter was:  Unsuccessful.  07/27/2021 ?Name: UVALDO RYBACKI MRN: [id number] DOB: 1955/11/17 ? ?Unsuccessful outbound call made today to assist with:  Transportation Needs  ? ?Outreach Attempt:  1st Attempt ? ?.Mailbox is full unable to leave any messages at this time  ?Kazimierz Springborn Greenauer -Selinda Eon ?Care Guide , Embedded Care Coordination ?[place], Care Management  ?[phone] ?[address] , [place] [address] ?Email : [name] [email or web address] ?  ? ?

## 2021-07-28 ENCOUNTER — Telehealth: Payer: Self-pay | Admitting: *Deleted

## 2021-07-28 NOTE — Telephone Encounter (Signed)
Transition Care Management Unsuccessful Follow-up Telephone Call ? ?Date of discharge and from where:  [facility]  ?07-23-2021 ? ?Attempts:  1st Attempt ? ?Reason for unsuccessful TCM follow-up call:  Voice mail full ? ?  ?

## 2021-08-02 DIAGNOSIS — F17210 Nicotine dependence, cigarettes, uncomplicated: Secondary | ICD-10-CM

## 2021-08-02 DIAGNOSIS — I10 Essential (primary) hypertension: Secondary | ICD-10-CM | POA: Diagnosis not present

## 2021-08-02 DIAGNOSIS — E785 Hyperlipidemia, unspecified: Secondary | ICD-10-CM

## 2021-08-04 ENCOUNTER — Telehealth: Payer: Self-pay | Admitting: *Deleted

## 2021-08-04 NOTE — Telephone Encounter (Signed)
? ?  Telephone encounter was:  Unsuccessful.  08/04/2021 ?Name: Tyler Cohen MRN: [id number] DOB: Jul 14, 1955 ? ?Unsuccessful outbound call made today to assist with:  Transportation Needs  ? ?Outreach Attempt:  3rd ?Voice mail full ? ?[name] ?Care Guide , Embedded Care Coordination ?[place], Care Management  ?[phone] ?[address] , [place] [address] ?Email : [name] [email or web address] ?  ?

## 2021-08-06 ENCOUNTER — Telehealth: Payer: Self-pay | Admitting: *Deleted

## 2021-08-06 NOTE — Telephone Encounter (Signed)
? ?  Telephone encounter was:  Unsuccessful.  08/06/2021 ?Name: Tyler Cohen MRN: [id number] DOB: 1955-07-08 ? ?Unsuccessful outbound call made today to assist with: Transportation ? ?Outreach Attempt:  3rd Attempt.  Referral closed unable to contact patient. ? ?A HIPAA compliant voice message was left requesting a return call.  Instructed patient to call back at   Instructed patient to call back at [phone]  at their earliest convenience. . ? ?Emre Stock Greenauer -Selinda Eon ?Care Guide , Embedded Care Coordination ?[place], Care Management  ?[phone] ?[address] , [place] [address] ?Email : [name] [email or web address] ?  ?

## 2021-08-13 DIAGNOSIS — I700 Atherosclerosis of aorta: Secondary | ICD-10-CM | POA: Diagnosis not present

## 2021-08-13 DIAGNOSIS — I5020 Unspecified systolic (congestive) heart failure: Secondary | ICD-10-CM | POA: Diagnosis not present

## 2021-08-13 DIAGNOSIS — I480 Paroxysmal atrial fibrillation: Secondary | ICD-10-CM | POA: Diagnosis not present

## 2021-08-14 ENCOUNTER — Other Ambulatory Visit (HOSPITAL_COMMUNITY): Payer: Self-pay | Admitting: *Deleted

## 2021-08-14 ENCOUNTER — Other Ambulatory Visit: Payer: Self-pay | Admitting: Cardiology

## 2021-08-14 DIAGNOSIS — I700 Atherosclerosis of aorta: Secondary | ICD-10-CM

## 2021-08-14 DIAGNOSIS — I5020 Unspecified systolic (congestive) heart failure: Secondary | ICD-10-CM

## 2021-08-14 MED ORDER — METOPROLOL TARTRATE 50 MG PO TABS: ORAL_TABLET | ORAL | 0 refills | Status: DC

## 2021-08-19 ENCOUNTER — Telehealth (HOSPITAL_COMMUNITY): Payer: Self-pay | Admitting: *Deleted

## 2021-08-19 NOTE — Telephone Encounter (Signed)
Reaching out to patient to offer assistance regarding upcoming cardiac imaging study; pt verbalizes understanding of appt date/time, parking situation and where to check in, pre-test NPO status and medications ordered, and verified current allergies; name and call back number provided for further questions should they arise ? ?Gordy Clement RN Navigator Cardiac Imaging ?[facility] ?[phone] office ?[phone] cell ? ?Patient to take '50mg'$  metoprolol two hours prior to his cardiac CT scan. ?

## 2021-08-20 ENCOUNTER — Ambulatory Visit
Admission: RE | Admit: 2021-08-20 | Discharge: 2021-08-20 | Disposition: A | Discharge status: Home / Self Care | Payer: Medicare (Managed Care) | Source: Ambulatory Visit | Attending: Cardiology | Admitting: Cardiology

## 2021-08-20 DIAGNOSIS — I700 Atherosclerosis of aorta: Secondary | ICD-10-CM | POA: Insufficient documentation

## 2021-08-20 DIAGNOSIS — I5020 Unspecified systolic (congestive) heart failure: Secondary | ICD-10-CM | POA: Diagnosis not present

## 2021-08-20 DIAGNOSIS — R911 Solitary pulmonary nodule: Secondary | ICD-10-CM

## 2021-08-20 HISTORY — DX: Solitary pulmonary nodule: R91.1

## 2021-08-20 MED ORDER — NITROGLYCERIN 0.4 MG SL SUBL
0.8000 mg | SUBLINGUAL_TABLET | Freq: Once | SUBLINGUAL | Status: AC
  Administered 2021-08-20: 0.8 mg via SUBLINGUAL

## 2021-08-20 MED ORDER — IOHEXOL 350 MG/ML SOLN
75.0000 mL | Freq: Once | INTRAVENOUS | Status: AC | PRN
  Administered 2021-08-20: 75 mL via INTRAVENOUS

## 2021-08-20 NOTE — Progress Notes (Signed)
Patient tolerated procedure well. Ambulate w/o difficulty. Denies light headedness or being dizzy. Sitting in chair drinking water provided. Encouraged to drink extra water today and reasoning explained. Verbalized understanding. All questions answered. ABC intact. No further needs. Discharge from procedure area w/o issues.   °

## 2021-08-27 DIAGNOSIS — I480 Paroxysmal atrial fibrillation: Secondary | ICD-10-CM | POA: Diagnosis not present

## 2021-09-29 ENCOUNTER — Ambulatory Visit: Payer: Medicare (Managed Care) | Admitting: Family Medicine

## 2021-10-13 DIAGNOSIS — Z8679 Personal history of other diseases of the circulatory system: Secondary | ICD-10-CM | POA: Diagnosis not present

## 2021-10-13 DIAGNOSIS — I5020 Unspecified systolic (congestive) heart failure: Secondary | ICD-10-CM | POA: Diagnosis not present

## 2021-10-13 DIAGNOSIS — I480 Paroxysmal atrial fibrillation: Secondary | ICD-10-CM | POA: Diagnosis not present

## 2021-10-14 ENCOUNTER — Ambulatory Visit (INDEPENDENT_AMBULATORY_CARE_PROVIDER_SITE_OTHER): Payer: Medicare (Managed Care)

## 2021-10-14 ENCOUNTER — Telehealth: Payer: Self-pay

## 2021-10-14 ENCOUNTER — Telehealth: Payer: Medicare (Managed Care)

## 2021-10-14 DIAGNOSIS — E0590 Thyrotoxicosis, unspecified without thyrotoxic crisis or storm: Secondary | ICD-10-CM

## 2021-10-14 DIAGNOSIS — I4891 Unspecified atrial fibrillation: Secondary | ICD-10-CM

## 2021-10-14 DIAGNOSIS — Z5900 Homelessness unspecified: Secondary | ICD-10-CM

## 2021-10-14 DIAGNOSIS — Z59819 Housing instability, housed unspecified: Secondary | ICD-10-CM

## 2021-10-14 DIAGNOSIS — E7800 Pure hypercholesterolemia, unspecified: Secondary | ICD-10-CM

## 2021-10-14 DIAGNOSIS — I10 Essential (primary) hypertension: Secondary | ICD-10-CM

## 2021-10-14 NOTE — Telephone Encounter (Signed)
   Telephone encounter was:  Successful.  10/14/2021 Name: Tyler Cohen MRN: [id number] DOB: 1955/08/15  Tyler Cohen is a 66 y.o. year old male who is a primary care patient of Valerie Roys, DO . The community resource team was consulted for assistance with Transportation Needs , Food Insecurity, and Financial Difficulties related to financial strain  Care guide performed the following interventions: Patient provided with information about care guide support team and interviewed to confirm resource needs.Patient stated he needs housing food financial assistance and transportation I will be putting in referrals [email or web address] as well as mailing resources   Follow Up Plan:  Care guide will follow up with patient by phone over the next two weeks    [facility]  [phone] [address], [place], [place] [address] Phone: [phone] Email: [email or web address]

## 2021-10-14 NOTE — Patient Instructions (Signed)
Visit Information  Thank you for taking time to visit with me today. Please don't hesitate to contact me if I can be of assistance to you before our next scheduled telephone appointment.  Following are the goals we discussed today:  Care Plan : RNCM: General Plan of Care (Adult) for Chronic Disease Management and Care Coordination Needs  Updates made by Vanita Ingles, RN since 10/14/2021 12:00 AM  Completed 10/14/2021    Problem: RNCM: Development of Plan of Care for Chronic Disease Management (HTN, HLD, Hyperthyroidism, Homelessness, Smoker) Resolved 10/14/2021  Priority: High       Long-Range Goal: RNCM: Effective Management of Plan of Care for Chronic Disease Management (HTN, HLD, Hyperthyroidism, Homelessness, Smoker) Completed 10/14/2021  Start Date: 02/02/2021  Expected End Date: 02/02/2022  Recent Progress: On track  Priority: High  Note:   Current Barriers: 10-14-2021: Goals met and care plan is being closed. The patients chronic conditions are stable. New referral placed for care guides for assistance with transportation due to his car being messed up. The patient states the best time to call him is in the am. Education on the care guide team not being able to reach him and to be available when they call to help with his expressed needs. The patient is following up with the list he is on for housing. He is hopeful to get housing soon for him and his wife. Education and support given. Knowledge Deficits related to plan of care for management of HTN, HLD, Homelessness, and Hyperthyroidism and smoker   Care Coordination needs related to Limited social support, Housing barriers, and Social Isolation  Chronic Disease Management support and education needs related to HTN, HLD, Homelessness, and Hyperthyroidism, and smoker  Lacks caregiver support.  Film/video editor.  Homeless, Is currently living with family, his wife is in a nursing facility,the patient asking for help with housing-  07-22-2021: The patient has been placed on wait list and is checking periodically. Continue to live with family currently.    RNCM Clinical Goal(s):  Patient will verbalize understanding of plan for management of HTN, HLD, Homelessness, Tobacco Use, and Hyperthyroidism as evidenced by seeing pcp on a regular basis, regular outreaches with the CCM team, following up on resources provided  take all medications exactly as prescribed and will call provider for medication related questions as evidenced by compliance with mediation, no missed doseages     demonstrate understanding of rationale for each prescribed medication as evidenced by taking as prescribed, calling before refills are needed    attend all scheduled medical appointments: with pcp as evidenced by keeping appointments and calling if appointments need to be changed        demonstrate improved and ongoing adherence to prescribed treatment plan for HTN, HLD, Homelessness, Tobacco Use, and Hyperthyroidism  as evidenced by Finding adequate housing, working with the CCM team to meet needs  continue to work with Consulting civil engineer and/or Social Worker to address care management and care coordination needs related to HTN, HLD, Tobacco Use, and hyperthyroidism as evidenced by adherence to CM Team Scheduled appointments     work with Education officer, museum to address Financial constraints related to the need for stable housing, Limited social support, and Housing barriers related to the management of HTN, HLD, Homelessness, Tobacco Use, and hyperthyroidism as evidenced by review of EMR and patient or Education officer, museum report     work with community resource care guide to address needs related to Housing barriers as evidenced by patient  and/or community resource care guide support    demonstrate ongoing self health care management ability for effective management of chronic conditions as evidenced by     through collaboration with Consulting civil engineer, provider, and care team.     Interventions: 1:1 collaboration with primary care provider regarding development and update of comprehensive plan of care as evidenced by provider attestation and co-signature Inter-disciplinary care team collaboration (see longitudinal plan of care) Evaluation of current treatment plan related to  self management and patient's adherence to plan as established by provider     SDOH Barriers (Status: Goal Met.) Long Term Goal 10-14-2021: Goals met and care plan is being closed. The patient is working with the housing on finding stable housing for him and his wife. The patient has been following up on the waiting list. New care guide referral placed for the patient to get help with transportation needs. Patient interviewed and SDOH assessment performed        Patient interviewed and appropriate assessments performed Provided patient with information about checking email as the care guides had sent him a 4 page document with housing resources on it for help with housing in [place]. 22-Mar-2021: His roommate passed away on 03-20-2021 and he is currently staying in the home but is leaving. The landlord wants >900.00 and it has mold. The patient states he is not staying there. He does have somewhere to stay until he finds somewhere to go. He has received information in mail about resources for housing and is working on trying to find a new place to live. 05-12-2021: The patient is still homeless. His cousin passed away and he is living with different relatives now. He still is looking for stable housing. States he is on the waiting list. Encouraged him to call and see what his status on the waiting list is. The patient wants to find a house so his wife can come out of the nursing home and back home. Education and support given. 07-22-2021: The patient was in the hospital x 2 in February. He is currently still living with family and his wife is still at a SNF. He checks frequently and is on the waiting list  at several places. The patient states he is doing well right now and feeling much better.  Discussed plans with patient for ongoing care management follow up and provided patient with direct contact information for care management team Advised patient to look at email sent to the patient last week. To call the office for changes in SDOH, Questions or concerns. Also advised the patient that the [facility] would collaborate with LCSW for additional support for housing Provided education to patient/caregiver regarding level of care options. 07-22-2021: New care guide referral for transportation needs. The patient states that he cannot afford to keep his car and is having to get rid of it. He is open to talking to care guides about transportation resources in [place].        Hyperthyroidism  (Status: Goal Met.) Long Term Goal 10-14-2021: Goals met and care plan is being closed  Evaluation of current treatment plan related to  Hyperthyroidism ,  self-management and patient's adherence to plan as established by provider. 05-12-2021: Denies any issues with his hyperthyroidism. Will continue to monitor. 07-22-2021: Was in the hospital x 2 in February for thyrotoxicosis with toxic multinodular goiter and thyroid storm. The patient states he is feeling much better and is following up with endocrinology on a consistent basis. He saw  endocrinology on 4-11 and has an appointment at [facility] on 5-11 and [place] on 08-11-2021. The patient states he is being closely monitored and knows to call for changes in his condition. Denies any acute findings today. Will continue to monitor.  Discussed plans with patient for ongoing care management follow up and provided patient with direct contact information for care management team Advised patient to call the office for changes in conditions, questions or concerns; Provided education to patient re: hyperthyroidism, sx and sx to monitor for ; Reviewed medications with patient and discussed  compliance. 07-22-2021: The patient states compliance with his medications. Denies issues with obtaining medications ; Discussed plans with patient for ongoing care management follow up and provided patient with direct contact information for care management team; Screening for signs and symptoms of depression related to chronic disease state;  Assessed social determinant of health barriers; 07-22-2021: New referral place today with the care guides for assistance with transportation needs.    Hyperlipidemia:  (Status: Goal Met.) 10-14-2021 Goals met and care plan is being closed. The patient saw the cardiologist yesterday and medications were changes. The patient is considering a pacemaker.       Lab Results  Component Value Date    CHOL 113 03/19/2021    HDL 33 (L) 03/19/2021    LDLCALC 65 03/19/2021    TRIG 70 03/19/2021    CHOLHDL 3.4 11/08/2017      Medication review performed; medication list updated in electronic medical record.  Provider established cholesterol goals reviewed. 05-12-2021: Is at goal. Praised for good results; Counseled on importance of regular laboratory monitoring as prescribed. 07-22-2021: Has labs done on a regular basis; Provided HLD educational materials; Reviewed role and benefits of statin for ASCVD risk reduction; Discussed strategies to manage statin-induced myalgias; Reviewed importance of limiting foods high in cholesterol. 07-22-2021: Education and review ; Reviewed exercise goals and target of 150 minutes per week;   Hypertension: (Status: Goal Met.)10-14-2021: Goals met and care plan is being closed. The patient saw the cardiologist yesterday and had medications changes. The patient is considering pacemaker placement.  Last practice recorded BP readings:     BP Readings from Last 3 Encounters:  08/20/21 97/61  06/29/21 106/67  05/29/21 109/64  Most recent eGFR/CrCl:       Lab Results  Component Value Date    EGFR 53 (L) 05/29/2021    No components  found for: CRCL   Evaluation of current treatment plan related to hypertension self management and patient's adherence to plan as established by provider. 07-22-2021: Denies any issues with heart health. Will continue to monitor.   Has been in the hospital x 2 in February due to thyroid storm. The patient with decline in EGFR. Education and support given.  Provided education to patient re: stroke prevention, s/s of heart attack and stroke; Reviewed prescribed diet heart healthy. 07-22-2021: Education and support given Reviewed medications with patient and discussed importance of compliance. 07-22-2021: The patient is compliant with medications. Denies any new medication needs. Saw cardiology recently  Counseled on adverse effects of illicit drug and excessive alcohol use in patients with high blood pressure;  Discussed plans with patient for ongoing care management follow up and provided patient with direct contact information for care management team; Advised patient, providing education and rationale, to monitor blood pressure daily and record, calling PCP for findings outside established parameters;  Provided education on prescribed diet heart healthy;  Discussed complications of poorly controlled blood pressure such as  heart disease, stroke, circulatory complications, vision complications, kidney impairment, sexual dysfunction;    Smoking Cessation: (Status: Patient declined further engagement on this goal.) Long Term Goal  Reviewed smoking history:  tobacco abuse of 42 years; currently smoking 0.25 ppd Previous quit attempts, unsuccessful unknown successful using unknown  Reports smoking within 30 minutes of waking up Reports triggers to smoke include: housing situation, wife in a nursing home, stressors  Reports motivation to quit smoking includes: knows it would be better on his health and well being  On a scale of 1-10, reports MOTIVATION to quit is 0 On a scale of 1-10, reports CONFIDENCE  in quitting is 0   Evaluation of current treatment plan reviewed; Advised patient to discuss smoking cessation options with provider; Provided contact information for [place] Quit Line (1-800-QUIT-NOW); Discussed plans with patient for ongoing care management follow up and provided patient with direct contact information for care management team; The patient is not ready to stop smoking at this time     Homelessness   (Status: Goal Not Met.) Long Term Goal 10-14-2021: The patient is working with the housing authority and the patient is checking his waiting list. He is hopeful to have stable housing for him and his wife soon. Knows to call for changes or new needs.  Evaluation of current treatment plan related to Homelessness, Housing barriers self-management and patient's adherence to plan as established by provider. 05-12-2021: The patient states that his cousin died and he is living with other family at this time. The patient states that he is on the waiting list for places but has not heard from anyone. The patient encouraged to call the places and check to see if he has moved up on waiting list. The patient wants to secure a place for him and his wife. His wife currently is still in the nursing home living. Emotional support and education given. 07-22-2021: The patient continues to live with family members. His wife is safe at a nursing facility. The patient checks the waiting list periodically. Denies any acute needs at this time.  Discussed plans with patient for ongoing care management follow up and provided patient with direct contact information for care management team Advised patient to look at email sent to him from the care guides with resources to help with finding stable housing for him and his wife. The pateint called the office today and ask for RNCM to return call to the patient .The patient states that his roommate is in the hospital and he is currently staying in the house but he does not know  how long he can stay there. Was reaching out for help. 03-10-2021: The patient has received mailed copy of resources. His roommate died on Mar 16, 2021 and he has to move from the house. He is staying there until next week. He does not want to live there as there is mold in the home and the landlord wants >900.00 per month. The patient states he has somewhere to go until he can find stable housing. Encouraged the patient to utilize resources he has to find stable housing. Will also update the LCSW. Provided education to patient re: calling resources on the email provided by the care guides to help with housing opitons. ; Collaborated with LCSW for recommendations  regarding patients stated need of housing. ; Social Work referral for additional help and resources for housing needs ; Discussed plans with patient for ongoing care management follow up and provided patient with direct contact information for  care management team;    Patient Goals/Self-Care Activities: Patient will self administer medications as prescribed as evidenced by self report/primary caregiver report  Patient will attend all scheduled provider appointments as evidenced by clinician review of documented attendance to scheduled appointments and patient/caregiver report Patient will call pharmacy for medication refills as evidenced by patient report and review of pharmacy fill history as appropriate Patient will attend church or other social activities as evidenced by patient report Patient will continue to perform ADL's independently as evidenced by patient/caregiver report Patient will continue to perform IADL's independently as evidenced by patient/caregiver report Patient will call provider office for new concerns or questions as evidenced by review of documented incoming telephone call notes and patient report Patient will work with BSW to address care coordination needs and will continue to work with the clinical team to address  health care and disease management related needs as evidenced by documented adherence to scheduled care management/care coordination appointments - check blood pressure weekly - choose a place to take my blood pressure (home, clinic or office, retail store) - write blood pressure results in a log or diary - learn about high blood pressure - keep a blood pressure log - take blood pressure log to all doctor appointments - call doctor for signs and symptoms of high blood pressure - develop an action plan for high blood pressure - keep all doctor appointments - take medications for blood pressure exactly as prescribed - report new symptoms to your doctor - eat more whole grains, fruits and vegetables, lean meats and healthy fats - call for medicine refill 2 or 3 days before it runs out - take all medications exactly as prescribed - call doctor with any symptoms you believe are related to your medicine - call doctor when you experience any new symptoms - go to all doctor appointments as scheduled - adhere to prescribed diet: Heart Healthy         No further follow up required at this time. The patient has met the goals of care and the care plan is being closed.  Please call the care guide team at [phone] if you need to schedule an appointment.   If you are experiencing a Mental Health or [place] or need someone to talk to, please call the Suicide and Crisis Lifeline: 988 call the [place] National Suicide Prevention Lifeline: (615)525-4724 or TTY: 786-074-1684 TTY (769)881-4457) to talk to a trained counselor call 1-800-273-TALK (toll free, 24 hour hotline)   The patient verbalized understanding of instructions, educational materials, and care plan provided today and DECLINED offer to receive copy of patient instructions, educational materials, and care plan.   Noreene Larsson RN, MSN, [facility] Mobile: [phone]

## 2021-10-14 NOTE — Chronic Care Management (AMB) (Signed)
Chronic Care Management   CCM RN Visit Note  10/14/2021 Name: Tyler Cohen MRN: [id number] DOB: 23-Oct-1955  Subjective: Tyler Cohen is a 66 y.o. year old male who is a primary care patient of Valerie Roys, DO. The care management team was consulted for assistance with disease management and care coordination needs.    Engaged with patient by telephone for follow up visit in response to provider referral for case management and/or care coordination services. Also case closure. The patient is stable with chronic conditions.  Consent to Services:  The patient was given information about Chronic Care Management services, agreed to services, and gave verbal consent prior to initiation of services.  Please see initial visit note for detailed documentation.   Patient agreed to services and verbal consent obtained.   Assessment: Review of patient past medical history, allergies, medications, health status, including review of consultants reports, laboratory and other test data, was performed as part of comprehensive evaluation and provision of chronic care management services.   SDOH (Social Determinants of Health) assessments and interventions performed:    CCM Care Plan  Allergies  Allergen Reactions   Lactose Intolerance (Gi)     Outpatient Encounter Medications as of 10/14/2021  Medication Sig   amiodarone (PACERONE) 200 MG tablet Take by mouth.   digoxin (LANOXIN) 0.25 MG tablet Take 1 tablet (0.25 mg total) by mouth daily.   empagliflozin (JARDIANCE) 10 MG TABS tablet Take 1 tablet by mouth daily.   furosemide (LASIX) 40 MG tablet Take 1 tablet by mouth daily.   metoprolol tartrate (LOPRESSOR) 50 MG tablet Take 29m tablet TWO hours prior to your cardiac CT scan.   polyethylene glycol (MIRALAX / GLYCOLAX) 17 g packet Take 17 g by mouth daily as needed for moderate constipation.   No facility-administered encounter medications on file as of 10/14/2021.    Patient Active  Problem List   Diagnosis Date Noted   Atrial fibrillation ([place] 06/08/2021   Congestive heart failure ([place] 06/08/2021   Malnutrition of moderate degree 05/15/2021   Thyroid crisis or storm 05/13/2021   Aortic atherosclerosis ([place] 06/25/2020   BPH (benign prostatic hyperplasia) 10/27/2018   Multinodular goiter 12/08/2017   Hyperthyroidism 12/08/2017   Left thyroid nodule 05/04/2017   Adrenal mass ([place] 05/04/2017   Renal mass 01/17/2017   Liver lesion 11/29/2016   Advanced care planning/counseling discussion 11/02/2016   Digital mucinous cyst of finger of right hand 11/02/2016   Benign neoplasm of sigmoid colon    Psoriasis 09/03/2015   Tobacco abuse    Macrocytosis    Hyperlipidemia 08/29/2014   Dental disease 08/29/2014    Conditions to be addressed/monitored:Atrial Fibrillation, HTN, HLD, Hypothyroidism, and homelessness  Care Plan : RNCM: General Plan of Care (Adult) for Chronic Disease Management and Care Coordination Needs  Updates made by TVanita Ingles RN since 10/14/2021 12:00 AM  Completed 10/14/2021   Problem: RNCM: Development of Plan of Care for Chronic Disease Management (HTN, HLD, Hyperthyroidism, Homelessness, Smoker) Resolved 10/14/2021  Priority: High     Long-Range Goal: RNCM: Effective Management of Plan of Care for Chronic Disease Management (HTN, HLD, Hyperthyroidism, Homelessness, Smoker) Completed 10/14/2021  Start Date: 02/02/2021  Expected End Date: 02/02/2022  Recent Progress: On track  Priority: High  Note:   Current Barriers: 10-14-2021: Goals met and care plan is being closed. The patients chronic conditions are stable. New referral placed for care guides for assistance with transportation due to his car being messed up. The patient  states the best time to call him is in the am. Education on the care guide team not being able to reach him and to be available when they call to help with his expressed needs. The patient is following up with the list he  is on for housing. He is hopeful to get housing soon for him and his wife. Education and support given. Knowledge Deficits related to plan of care for management of HTN, HLD, Homelessness, and Hyperthyroidism and smoker   Care Coordination needs related to Limited social support, Housing barriers, and Social Isolation  Chronic Disease Management support and education needs related to HTN, HLD, Homelessness, and Hyperthyroidism, and smoker  Lacks caregiver support.  Film/video editor.  Homeless, Is currently living with family, his wife is in a nursing facility,the patient asking for help with housing- 07-22-2021: The patient has been placed on wait list and is checking periodically. Continue to live with family currently.   RNCM Clinical Goal(s):  Patient will verbalize understanding of plan for management of HTN, HLD, Homelessness, Tobacco Use, and Hyperthyroidism as evidenced by seeing pcp on a regular basis, regular outreaches with the CCM team, following up on resources provided  take all medications exactly as prescribed and will call provider for medication related questions as evidenced by compliance with mediation, no missed doseages     demonstrate understanding of rationale for each prescribed medication as evidenced by taking as prescribed, calling before refills are needed    attend all scheduled medical appointments: with pcp as evidenced by keeping appointments and calling if appointments need to be changed        demonstrate improved and ongoing adherence to prescribed treatment plan for HTN, HLD, Homelessness, Tobacco Use, and Hyperthyroidism  as evidenced by Finding adequate housing, working with the CCM team to meet needs  continue to work with Consulting civil engineer and/or Education officer, museum to address care management and care coordination needs related to HTN, HLD, Tobacco Use, and hyperthyroidism as evidenced by adherence to CM Team Scheduled appointments     work with Education officer, museum to  address Financial constraints related to the need for stable housing, Limited social support, and Housing barriers related to the management of HTN, HLD, Homelessness, Tobacco Use, and hyperthyroidism as evidenced by review of EMR and patient or Education officer, museum report     work with Data processing manager care guide to address needs related to Housing barriers as evidenced by patient and/or community resource care guide support    demonstrate ongoing self health care management ability for effective management of chronic conditions as evidenced by     through collaboration with Consulting civil engineer, provider, and care team.   Interventions: 1:1 collaboration with primary care provider regarding development and update of comprehensive plan of care as evidenced by provider attestation and co-signature Inter-disciplinary care team collaboration (see longitudinal plan of care) Evaluation of current treatment plan related to  self management and patient's adherence to plan as established by provider   SDOH Barriers (Status: Goal Met.) Long Term Goal 10-14-2021: Goals met and care plan is being closed. The patient is working with the housing on finding stable housing for him and his wife. The patient has been following up on the waiting list. New care guide referral placed for the patient to get help with transportation needs. Patient interviewed and SDOH assessment performed        Patient interviewed and appropriate assessments performed Provided patient with information about checking email as the care guides  had sent him a 4 page document with housing resources on it for help with housing in [place]. 03-24-21: His roommate passed away on 2021-03-22 and he is currently staying in the home but is leaving. The landlord wants >900.00 and it has mold. The patient states he is not staying there. He does have somewhere to stay until he finds somewhere to go. He has received information in mail about resources for  housing and is working on trying to find a new place to live. 05-12-2021: The patient is still homeless. His cousin passed away and he is living with different relatives now. He still is looking for stable housing. States he is on the waiting list. Encouraged him to call and see what his status on the waiting list is. The patient wants to find a house so his wife can come out of the nursing home and back home. Education and support given. 07-22-2021: The patient was in the hospital x 2 in February. He is currently still living with family and his wife is still at a SNF. He checks frequently and is on the waiting list at several places. The patient states he is doing well right now and feeling much better.  Discussed plans with patient for ongoing care management follow up and provided patient with direct contact information for care management team Advised patient to look at email sent to the patient last week. To call the office for changes in SDOH, Questions or concerns. Also advised the patient that the [facility] would collaborate with LCSW for additional support for housing Provided education to patient/caregiver regarding level of care options. 07-22-2021: New care guide referral for transportation needs. The patient states that he cannot afford to keep his car and is having to get rid of it. He is open to talking to care guides about transportation resources in [place].     Hyperthyroidism  (Status: Goal Met.) Long Term Goal 10-14-2021: Goals met and care plan is being closed  Evaluation of current treatment plan related to  Hyperthyroidism ,  self-management and patient's adherence to plan as established by provider. 05-12-2021: Denies any issues with his hyperthyroidism. Will continue to monitor. 07-22-2021: Was in the hospital x 2 in February for thyrotoxicosis with toxic multinodular goiter and thyroid storm. The patient states he is feeling much better and is following up with endocrinology on a  consistent basis. He saw endocrinology on 4-11 and has an appointment at [facility] on 5-11 and [place] on 08-11-2021. The patient states he is being closely monitored and knows to call for changes in his condition. Denies any acute findings today. Will continue to monitor.  Discussed plans with patient for ongoing care management follow up and provided patient with direct contact information for care management team Advised patient to call the office for changes in conditions, questions or concerns; Provided education to patient re: hyperthyroidism, sx and sx to monitor for ; Reviewed medications with patient and discussed compliance. 07-22-2021: The patient states compliance with his medications. Denies issues with obtaining medications ; Discussed plans with patient for ongoing care management follow up and provided patient with direct contact information for care management team; Screening for signs and symptoms of depression related to chronic disease state;  Assessed social determinant of health barriers; 07-22-2021: New referral place today with the care guides for assistance with transportation needs.   Hyperlipidemia:  (Status: Goal Met.) 10-14-2021 Goals met and care plan is being closed. The patient saw the cardiologist yesterday and  medications were changes. The patient is considering a pacemaker.  Lab Results  Component Value Date   CHOL 113 03/19/2021   HDL 33 (L) 03/19/2021   LDLCALC 65 03/19/2021   TRIG 70 03/19/2021   CHOLHDL 3.4 11/08/2017     Medication review performed; medication list updated in electronic medical record.  Provider established cholesterol goals reviewed. 05-12-2021: Is at goal. Praised for good results; Counseled on importance of regular laboratory monitoring as prescribed. 07-22-2021: Has labs done on a regular basis; Provided HLD educational materials; Reviewed role and benefits of statin for ASCVD risk reduction; Discussed strategies to manage statin-induced  myalgias; Reviewed importance of limiting foods high in cholesterol. 07-22-2021: Education and review ; Reviewed exercise goals and target of 150 minutes per week;  Hypertension: (Status: Goal Met.)10-14-2021: Goals met and care plan is being closed. The patient saw the cardiologist yesterday and had medications changes. The patient is considering pacemaker placement.  Last practice recorded BP readings:  BP Readings from Last 3 Encounters:  08/20/21 97/61  06/29/21 106/67  05/29/21 109/64  Most recent eGFR/CrCl:  Lab Results  Component Value Date   EGFR 53 (L) 05/29/2021    No components found for: CRCL  Evaluation of current treatment plan related to hypertension self management and patient's adherence to plan as established by provider. 07-22-2021: Denies any issues with heart health. Will continue to monitor.   Has been in the hospital x 2 in February due to thyroid storm. The patient with decline in EGFR. Education and support given.  Provided education to patient re: stroke prevention, s/s of heart attack and stroke; Reviewed prescribed diet heart healthy. 07-22-2021: Education and support given Reviewed medications with patient and discussed importance of compliance. 07-22-2021: The patient is compliant with medications. Denies any new medication needs. Saw cardiology recently  Counseled on adverse effects of illicit drug and excessive alcohol use in patients with high blood pressure;  Discussed plans with patient for ongoing care management follow up and provided patient with direct contact information for care management team; Advised patient, providing education and rationale, to monitor blood pressure daily and record, calling PCP for findings outside established parameters;  Provided education on prescribed diet heart healthy;  Discussed complications of poorly controlled blood pressure such as heart disease, stroke, circulatory complications, vision complications, kidney impairment,  sexual dysfunction;   Smoking Cessation: (Status: Patient declined further engagement on this goal.) Long Term Goal  Reviewed smoking history:  tobacco abuse of 42 years; currently smoking 0.25 ppd Previous quit attempts, unsuccessful unknown successful using unknown  Reports smoking within 30 minutes of waking up Reports triggers to smoke include: housing situation, wife in a nursing home, stressors  Reports motivation to quit smoking includes: knows it would be better on his health and well being  On a scale of 1-10, reports MOTIVATION to quit is 0 On a scale of 1-10, reports CONFIDENCE in quitting is 0  Evaluation of current treatment plan reviewed; Advised patient to discuss smoking cessation options with provider; Provided contact information for [place] Quit Line (1-800-QUIT-NOW); Discussed plans with patient for ongoing care management follow up and provided patient with direct contact information for care management team; The patient is not ready to stop smoking at this time   Homelessness   (Status: Goal Not Met.) Long Term Goal 10-14-2021: The patient is working with the housing authority and the patient is checking his waiting list. He is hopeful to have stable housing for him and his wife soon. Knows  to call for changes or new needs.  Evaluation of current treatment plan related to Homelessness, Housing barriers self-management and patient's adherence to plan as established by provider. 05-12-2021: The patient states that his cousin died and he is living with other family at this time. The patient states that he is on the waiting list for places but has not heard from anyone. The patient encouraged to call the places and check to see if he has moved up on waiting list. The patient wants to secure a place for him and his wife. His wife currently is still in the nursing home living. Emotional support and education given. 07-22-2021: The patient continues to live with family members. His wife is  safe at a nursing facility. The patient checks the waiting list periodically. Denies any acute needs at this time.  Discussed plans with patient for ongoing care management follow up and provided patient with direct contact information for care management team Advised patient to look at email sent to him from the care guides with resources to help with finding stable housing for him and his wife. The pateint called the office today and ask for RNCM to return call to the patient .The patient states that his roommate is in the hospital and he is currently staying in the house but he does not know how long he can stay there. Was reaching out for help. 03-10-2021: The patient has received mailed copy of resources. His roommate died on Mar 24, 2021 and he has to move from the house. He is staying there until next week. He does not want to live there as there is mold in the home and the landlord wants >900.00 per month. The patient states he has somewhere to go until he can find stable housing. Encouraged the patient to utilize resources he has to find stable housing. Will also update the LCSW. Provided education to patient re: calling resources on the email provided by the care guides to help with housing opitons. ; Collaborated with LCSW for recommendations  regarding patients stated need of housing. ; Social Work referral for additional help and resources for housing needs ; Discussed plans with patient for ongoing care management follow up and provided patient with direct contact information for care management team;   Patient Goals/Self-Care Activities: Patient will self administer medications as prescribed as evidenced by self report/primary caregiver report  Patient will attend all scheduled provider appointments as evidenced by clinician review of documented attendance to scheduled appointments and patient/caregiver report Patient will call pharmacy for medication refills as evidenced by patient report and  review of pharmacy fill history as appropriate Patient will attend church or other social activities as evidenced by patient report Patient will continue to perform ADL's independently as evidenced by patient/caregiver report Patient will continue to perform IADL's independently as evidenced by patient/caregiver report Patient will call provider office for new concerns or questions as evidenced by review of documented incoming telephone call notes and patient report Patient will work with BSW to address care coordination needs and will continue to work with the clinical team to address health care and disease management related needs as evidenced by documented adherence to scheduled care management/care coordination appointments - check blood pressure weekly - choose a place to take my blood pressure (home, clinic or office, retail store) - write blood pressure results in a log or diary - learn about high blood pressure - keep a blood pressure log - take blood pressure log to all doctor appointments -  call doctor for signs and symptoms of high blood pressure - develop an action plan for high blood pressure - keep all doctor appointments - take medications for blood pressure exactly as prescribed - report new symptoms to your doctor - eat more whole grains, fruits and vegetables, lean meats and healthy fats - call for medicine refill 2 or 3 days before it runs out - take all medications exactly as prescribed - call doctor with any symptoms you believe are related to your medicine - call doctor when you experience any new symptoms - go to all doctor appointments as scheduled - adhere to prescribed diet: Heart Healthy        Plan:No further follow up required: the patient has met the goals of care and his chronic conditions are stable. The patient knows how to reach the [facility] for follow up if needed.   Noreene Larsson RN, MSN, [facility] Mobile: [phone]

## 2021-10-27 ENCOUNTER — Telehealth: Payer: Self-pay

## 2021-10-27 NOTE — Telephone Encounter (Signed)
   Telephone encounter was:  Successful.  10/27/2021 Name: Tyler Cohen MRN: [id number] DOB: 04/15/1955  Tyler Cohen is a 66 y.o. year old male who is a primary care patient of Valerie Roys, DO . The community resource team was consulted for assistance with  [place] guide performed the following interventions: Patient provided with information about care guide support team and interviewed to confirm resource needs.Follow up for mailed resources, the Patient did receive the resources   Follow Up Plan:  No further follow up planned at this time. The patient has been provided with needed resources.    [place], Care Management  [phone] [address], [place], [place] [address] Phone: [phone] Email: [email or web address]

## 2021-10-30 ENCOUNTER — Emergency Department: Payer: Medicare (Managed Care)

## 2021-10-30 ENCOUNTER — Encounter: Payer: Self-pay | Admitting: Emergency Medicine

## 2021-10-30 ENCOUNTER — Emergency Department
Admission: EM | Admit: 2021-10-30 | Discharge: 2021-10-30 | Disposition: A | Discharge status: Home / Self Care | Payer: Medicare (Managed Care) | Attending: Emergency Medicine | Admitting: Emergency Medicine

## 2021-10-30 ENCOUNTER — Other Ambulatory Visit: Payer: Self-pay

## 2021-10-30 DIAGNOSIS — R944 Abnormal results of kidney function studies: Secondary | ICD-10-CM | POA: Diagnosis not present

## 2021-10-30 DIAGNOSIS — L02511 Cutaneous abscess of right hand: Secondary | ICD-10-CM | POA: Diagnosis not present

## 2021-10-30 DIAGNOSIS — M7989 Other specified soft tissue disorders: Secondary | ICD-10-CM | POA: Diagnosis not present

## 2021-10-30 LAB — COMPREHENSIVE METABOLIC PANEL
ALT: 17 U/L (ref 0–44)
AST: 21 U/L (ref 15–41)
Albumin: 4.2 g/dL (ref 3.5–5.0)
Alkaline Phosphatase: 50 U/L (ref 38–126)
Anion gap: 7 (ref 5–15)
BUN: 23 mg/dL (ref 8–23)
CO2: 24 mmol/L (ref 22–32)
Calcium: 9.2 mg/dL (ref 8.9–10.3)
Chloride: 106 mmol/L (ref 98–111)
Creatinine, Ser: 1.58 mg/dL — ABNORMAL HIGH (ref 0.61–1.24)
GFR, Estimated: 48 mL/min — ABNORMAL LOW (ref >60)
Glucose, Bld: 94 mg/dL (ref 70–99)
Potassium: 4 mmol/L (ref 3.5–5.1)
Sodium: 137 mmol/L (ref 135–145)
Total Bilirubin: 1.3 mg/dL — ABNORMAL HIGH (ref 0.3–1.2)
Total Protein: 7.6 g/dL (ref 6.5–8.1)

## 2021-10-30 LAB — CBC WITH DIFFERENTIAL/PLATELET
Abs Immature Granulocytes: 0.02 10*3/uL (ref 0.00–0.07)
Basophils Absolute: 0 10*3/uL (ref 0.0–0.1)
Basophils Relative: 0 %
Eosinophils Absolute: 0.1 10*3/uL (ref 0.0–0.5)
Eosinophils Relative: 2 %
HCT: 45.5 % (ref 39.0–52.0)
Hemoglobin: 15.2 g/dL (ref 13.0–17.0)
Immature Granulocytes: 0 %
Lymphocytes Relative: 24 %
Lymphs Abs: 2.1 10*3/uL (ref 0.7–4.0)
MCH: 33 pg (ref 26.0–34.0)
MCHC: 33.4 g/dL (ref 30.0–36.0)
MCV: 98.7 fL (ref 80.0–100.0)
Monocytes Absolute: 0.8 10*3/uL (ref 0.1–1.0)
Monocytes Relative: 9 %
Neutro Abs: 5.8 10*3/uL (ref 1.7–7.7)
Neutrophils Relative %: 65 %
Platelets: 148 10*3/uL — ABNORMAL LOW (ref 150–400)
RBC: 4.61 MIL/uL (ref 4.22–5.81)
RDW: 13.1 % (ref 11.5–15.5)
WBC: 8.9 10*3/uL (ref 4.0–10.5)
nRBC: 0 % (ref 0.0–0.2)

## 2021-10-30 MED ORDER — LIDOCAINE HCL (PF) 1 % IJ SOLN
5.0000 mL | Freq: Once | INTRAMUSCULAR | Status: AC
  Administered 2021-10-30: 5 mL
  Filled 2021-10-30: qty 5

## 2021-10-30 MED ORDER — HYDROCODONE-ACETAMINOPHEN 5-325 MG PO TABS: 1.0000 | ORAL_TABLET | Freq: Four times a day (QID) | ORAL | 0 refills | Status: DC | PRN

## 2021-10-30 MED ORDER — CEPHALEXIN 500 MG PO CAPS: 500.0000 mg | ORAL_CAPSULE | Freq: Four times a day (QID) | ORAL | 0 refills | Status: DC

## 2021-10-30 NOTE — ED Provider Notes (Signed)
[facility] Provider Note    Event Date/Time   First MD Initiated Contact with Patient 10/30/21 [phone]     (approximate)   History   Hand Pain   HPI  Tyler Cohen is a 66 y.o. male presents to the ED with complaint of right fifth finger swelling and pain.  Patient is unaware of any foreign body or cut to his finger but states that last night his finger became more swollen and today is painful.     Physical Exam   Triage Vital Signs: ED Triage Vitals  Enc Vitals Group     BP 10/30/21 0805 120/74     Pulse Rate 10/30/21 0804 75     Resp 10/30/21 0804 16     Temp 10/30/21 0805 98 F (36.7 C)     Temp Source 10/30/21 0805 Oral     SpO2 10/30/21 0804 96 %     Weight 10/30/21 0804 158 lb 8.2 oz (71.9 kg)     Height 10/30/21 0804 '6\' 1"'$  (1.854 m)     Head Circumference --      Peak Flow --      Pain Score 10/30/21 0804 8     Pain Loc --      Pain Edu? --      Excl. in [place]? --     Most recent vital signs: Vitals:   10/30/21 0937 10/30/21 1031  BP: 120/70 122/70  Pulse: 70 70  Resp: 16 16  Temp:    SpO2: 96% 96%     General: Awake, no distress.  CV:  Good peripheral perfusion.  Resp:  Normal effort.  Abd:  No distention.  Other:  Right fifth digit with moderate soft tissue edema noted to the medial aspect with fluctuance and marked tenderness.  No extending cellulitis present.  Capillary refills less than 3 seconds.  Range of motion is difficult due to soft tissue edema.    ED Results / Procedures / Treatments   Labs (all labs ordered are listed, but only abnormal results are displayed) Labs Reviewed  CBC WITH DIFFERENTIAL/PLATELET - Abnormal; Notable for the following components:      Result Value   Platelets 148 (*)    All other components within normal limits  COMPREHENSIVE METABOLIC PANEL - Abnormal; Notable for the following components:   Creatinine, Ser 1.58 (*)    Total Bilirubin 1.3 (*)    GFR, Estimated 48 (*)    All  other components within normal limits     RADIOLOGY Right hand x-ray images were reviewed by myself and no bony injury or foreign body noted.  Soft tissue edema noted to the fifth digit.  Radiology report is similar.    PROCEDURES:  Critical Care performed:   Marland KitchenMarland KitchenIncision and Drainage  Date/Time: 10/30/2021 9:49 AM  Performed by: Johnn Hai, PA-C Authorized by: Johnn Hai, PA-C   Consent:    Consent obtained:  Verbal   Consent given by:  Patient   Risks discussed:  Pain and infection Universal protocol:    Patient identity confirmed:  Verbally with patient Location:    Type:  Abscess   Location:  Upper extremity   Upper extremity location:  Finger   Finger location:  R small finger Pre-procedure details:    Skin preparation:  Antiseptic wash Sedation:    Sedation type:  None Anesthesia:    Anesthesia method:  Local infiltration   Local anesthetic:  Lidocaine 1% w/o epi Procedure  type:    Complexity:  Simple Procedure details:    Incision types:  Single straight   Incision depth:  Dermal   Wound management:  Probed and deloculated   Drainage:  Purulent   Drainage amount:  Moderate   Wound treatment:  Drain placed   Packing materials:  1/2 in iodoform gauze Post-procedure details:    Procedure completion:  Tolerated    MEDICATIONS ORDERED IN ED: Medications  lidocaine (PF) (XYLOCAINE) 1 % injection 5 mL (5 mLs Infiltration Given by Other 10/30/21 5364)     IMPRESSION / MDM / ASSESSMENT AND PLAN / ED COURSE  I reviewed the triage vital signs and the nursing notes.   Differential diagnosis includes, but is not limited to, abscess right fifth finger, cellulitis, foreign body.  66 year old male presents to the ED with a superficial abscess to his right fifth digit without known injury.  X-ray was negative for foreign body or bony injury.  Lab work was reassuring with CBC and CMP normal with the exception of a slightly elevated creatinine at 1.58.   I&D of the area was successful with moderate amount of purulent drainage from the area.  This was packed with iodoform gauze and patient was noted that this should be removed in 2 days and that if urgent care is not possible return to the emergency department.  A prescription for Keflex was sent to the pharmacy along with a prescription for hydrocodone.  Patient also was given return precautions should he develop any fever, chills or worsening of his finger infection he is to return to the emergency department sooner.      Patient's presentation is most consistent with acute complicated illness / injury requiring diagnostic workup.  FINAL CLINICAL IMPRESSION(S) / ED DIAGNOSES   Final diagnoses:  Abscess of right little finger     Rx / DC Orders   ED Discharge Orders          Ordered    cephALEXin (KEFLEX) 500 MG capsule  4 times daily        10/30/21 0948    HYDROcodone-acetaminophen (NORCO/VICODIN) 5-325 MG tablet  Every 6 hours PRN        10/30/21 0948             Note:  This document was prepared using Dragon voice recognition software and may include unintentional dictation errors.   Johnn Hai, PA-C 10/30/21 1149    Naaman Plummer, MD 10/30/21 [phone]

## 2021-10-30 NOTE — ED Notes (Signed)
See triage note   Presents with swelling to right 5 th finger  States he may have hit it when working on [employer] blade couple of weeks ago  Finger is red and swollen

## 2021-10-30 NOTE — ED Triage Notes (Signed)
Pt here with finger pain. Pt's pinky finger on his right hand has been swollen and painful x 2 days. Pt denies fever.

## 2021-10-30 NOTE — Discharge Instructions (Addendum)
You will need to have the packing removed in 2 days and this can be done in the emergency department or urgent care.  After packing is removed soak your hand in warm water 2-3 times a day.  Antibiotics were sent to the pharmacy to begin taking today and pain medication is every 6 hours if needed.  Do not drive or operate machinery while taking the pain medication.  If any worsening of your hand, fever, chills return to the emergency department over the weekend.

## 2021-11-02 ENCOUNTER — Telehealth: Payer: Self-pay | Admitting: *Deleted

## 2021-11-02 DIAGNOSIS — I4891 Unspecified atrial fibrillation: Secondary | ICD-10-CM

## 2021-11-02 DIAGNOSIS — I10 Essential (primary) hypertension: Secondary | ICD-10-CM

## 2021-11-02 DIAGNOSIS — E7800 Pure hypercholesterolemia, unspecified: Secondary | ICD-10-CM

## 2021-11-02 NOTE — Telephone Encounter (Signed)
Transition Care Management Unsuccessful Follow-up Telephone Call  Date of discharge and from where:  [facility] 7-28  Attempts:  1st Attempt  Reason for unsuccessful TCM follow-up call:  Left voice message  Please schedule patient for a hospital follow up If he calls   Thank You

## 2021-11-03 NOTE — Telephone Encounter (Signed)
Transition Care Management Follow-up Telephone Call Date of discharge and from where: [facility] 10-30-2021 How have you been since you were released from the hospital?   ok Any questions or concerns? No  Items Reviewed: Did the pt receive and understand the discharge instructions provided? Yes  Medications obtained and verified? Yes  Other? No  Any new allergies since your discharge? No  Dietary orders reviewed? No Do you have support at home? Yes   Home Care and Equipment/Supplies: Were home health services ordered?  If so, what is the name of the agency?   Has the agency set up a time to come to the patient's home?  Were any new equipment or medical supplies ordered?   What is the name of the medical supply agency?  Were you able to get the supplies/equipment?  Do you have any questions related to the use of the equipment or supplies?   Functional Questionnaire: (I = Independent and D = Dependent) ADLs: I  Bathing/Dressing- I  Meal Prep- I  Eating- I  Maintaining continence- I  Transferring/Ambulation- I  Managing Meds- I  Follow up appointments reviewed:  PCP Hospital f/u appt confirmed? Yes  Scheduled to see 11-17-2021 on 3:00. [facility] f/u appt confirmed? No  . Are transportation arrangements needed? No  If their condition worsens, is the pt aware to call PCP or go to the Emergency Dept.? Yes Was the patient provided with contact information for the PCP's office or ED? Yes Was to pt encouraged to call back with questions or concerns? Yes

## 2021-11-09 ENCOUNTER — Inpatient Hospital Stay: Payer: Medicare (Managed Care) | Admitting: Family Medicine

## 2021-11-17 ENCOUNTER — Ambulatory Visit: Payer: Medicare (Managed Care) | Admitting: Family Medicine

## 2021-11-17 ENCOUNTER — Encounter: Payer: Self-pay | Admitting: Family Medicine

## 2021-11-17 VITALS — BP 96/58 | HR 60 | Temp 98.2°F | Wt 160.4 lb

## 2021-11-17 DIAGNOSIS — L02511 Cutaneous abscess of right hand: Secondary | ICD-10-CM

## 2021-11-17 NOTE — Progress Notes (Signed)
BP (!) 96/58   Pulse 60   Temp 98.2 F (36.8 C)   Wt 160 lb 6.4 oz (72.8 kg)   SpO2 97%   BMI 21.16 kg/m    Subjective:    Patient ID: Tyler Cohen, male    DOB: 11/08/1955, 66 y.o.   MRN: [id number]  HPI: [name] is a 66 y.o. male  Chief Complaint  Patient presents with   Atrial Fibrillation   Hypothyroidism   Congestive Heart Failure   ER FOLLOW UP Time since discharge: 3 weeks Hospital/facility: [facility]  Diagnosis: finger abscess Procedures/tests: I and D Consultants: none New medications: keflex and norco Discharge instructions:  follow up here Status: better  Since getting out of the hospital, he has been feeling well. He still has a couple of days on his abx. He is tolerating medicine well. No other concerns or complaints at this time.   Relevant past medical, surgical, family and social history reviewed and updated as indicated. Interim medical history since our last visit reviewed. Allergies and medications reviewed and updated.  Review of Systems  Constitutional: Negative.   Respiratory: Negative.    Cardiovascular: Negative.   Gastrointestinal: Negative.   Musculoskeletal: Negative.   Skin: Negative.   Neurological: Negative.   Psychiatric/Behavioral: Negative.      Per HPI unless specifically indicated above     Objective:    BP (!) 96/58   Pulse 60   Temp 98.2 F (36.8 C)   Wt 160 lb 6.4 oz (72.8 kg)   SpO2 97%   BMI 21.16 kg/m   Wt Readings from Last 3 Encounters:  11/17/21 160 lb 6.4 oz (72.8 kg)  10/30/21 158 lb 8.2 oz (71.9 kg)  06/29/21 158 lb 9.6 oz (71.9 kg)    Physical Exam Vitals and nursing note reviewed.  Constitutional:      General: He is not in acute distress.    Appearance: Normal appearance. He is not ill-appearing, toxic-appearing or diaphoretic.  HENT:     Head: Normocephalic and atraumatic.     Right Ear: External ear normal.     Left Ear: External ear normal.     Nose: Nose normal.     Mouth/Throat:      Mouth: Mucous membranes are moist.     Pharynx: Oropharynx is clear.  Eyes:     General: No scleral icterus.       Right eye: No discharge.        Left eye: No discharge.     Extraocular Movements: Extraocular movements intact.     Conjunctiva/sclera: Conjunctivae normal.     Pupils: Pupils are equal, round, and reactive to light.  Cardiovascular:     Rate and Rhythm: Normal rate and regular rhythm.     Pulses: Normal pulses.     Heart sounds: Normal heart sounds. No murmur heard.    No friction rub. No gallop.  Pulmonary:     Effort: Pulmonary effort is normal. No respiratory distress.     Breath sounds: Normal breath sounds. No stridor. No wheezing, rhonchi or rales.  Chest:     Chest wall: No tenderness.  Musculoskeletal:        General: Normal range of motion.     Cervical back: Normal range of motion and neck supple.  Skin:    General: Skin is warm and dry.     Capillary Refill: Capillary refill takes less than 2 seconds.     Coloration: Skin is not jaundiced  or pale.     Findings: No bruising, erythema, lesion or rash.     Comments: Thickened skin on R little finger, no heat, no drainage, no fluctuance, no redness   Neurological:     General: No focal deficit present.     Mental Status: He is alert and oriented to person, place, and time. Mental status is at baseline.  Psychiatric:        Mood and Affect: Mood normal.        Behavior: Behavior normal.        Thought Content: Thought content normal.        Judgment: Judgment normal.     Results for orders placed or performed during the hospital encounter of 10/30/21  CBC with Differential  Result Value Ref Range   WBC 8.9 4.0 - 10.5 K/uL   RBC 4.61 4.22 - 5.81 MIL/uL   Hemoglobin 15.2 13.0 - 17.0 g/dL   HCT 45.5 39.0 - 52.0 %   MCV 98.7 80.0 - 100.0 fL   MCH 33.0 26.0 - 34.0 pg   MCHC 33.4 30.0 - 36.0 g/dL   RDW 13.1 11.5 - 15.5 %   Platelets 148 (L) 150 - 400 K/uL   nRBC 0.0 0.0 - 0.2 %   Neutrophils  Relative % 65 %   Neutro Abs 5.8 1.7 - 7.7 K/uL   Lymphocytes Relative 24 %   Lymphs Abs 2.1 0.7 - 4.0 K/uL   Monocytes Relative 9 %   Monocytes Absolute 0.8 0.1 - 1.0 K/uL   Eosinophils Relative 2 %   Eosinophils Absolute 0.1 0.0 - 0.5 K/uL   Basophils Relative 0 %   Basophils Absolute 0.0 0.0 - 0.1 K/uL   Immature Granulocytes 0 %   Abs Immature Granulocytes 0.02 0.00 - 0.07 K/uL  Comprehensive metabolic panel  Result Value Ref Range   Sodium 137 135 - 145 mmol/L   Potassium 4.0 3.5 - 5.1 mmol/L   Chloride 106 98 - 111 mmol/L   CO2 24 22 - 32 mmol/L   Glucose, Bld 94 70 - 99 mg/dL   BUN 23 8 - 23 mg/dL   Creatinine, Ser 1.58 (H) 0.61 - 1.24 mg/dL   Calcium 9.2 8.9 - 10.3 mg/dL   Total Protein 7.6 6.5 - 8.1 g/dL   Albumin 4.2 3.5 - 5.0 g/dL   AST 21 15 - 41 U/L   ALT 17 0 - 44 U/L   Alkaline Phosphatase 50 38 - 126 U/L   Total Bilirubin 1.3 (H) 0.3 - 1.2 mg/dL   GFR, Estimated 48 (L) >60 mL/min   Anion gap 7 5 - 15      Assessment & Plan:   Problem List Items Addressed This Visit   None Visit Diagnoses     Abscess of right little finger    -  Primary   Resolving. Finish abx. Call with any concerns.         Follow up plan: Return after 12/15 for physical.

## 2021-12-21 DIAGNOSIS — E0590 Thyrotoxicosis, unspecified without thyrotoxic crisis or storm: Secondary | ICD-10-CM | POA: Diagnosis not present

## 2021-12-22 ENCOUNTER — Ambulatory Visit: Payer: Medicare (Managed Care) | Admitting: Family Medicine

## 2022-01-11 ENCOUNTER — Ambulatory Visit: Payer: Medicare HMO

## 2022-01-12 ENCOUNTER — Ambulatory Visit (INDEPENDENT_AMBULATORY_CARE_PROVIDER_SITE_OTHER): Payer: Medicare (Managed Care) | Admitting: *Deleted

## 2022-01-12 DIAGNOSIS — Z0000 Encounter for general adult medical examination without abnormal findings: Secondary | ICD-10-CM | POA: Diagnosis not present

## 2022-01-12 NOTE — Patient Instructions (Signed)
Tyler Cohen , Thank you for taking time to come for your Medicare Wellness Visit. I appreciate your ongoing commitment to your health goals. Please review the following plan we discussed and let me know if I can assist you in the future.   These are the goals we discussed:  Goals      Increase physical activity     By riding bike  more     Obtain independent housing     Timeframe:  Long-Range Goal Priority:  High Start Date:      11/13/20                       Expected End Date:  02/02/21                     Follow Up Date 03/23/21   Patient Goals/Self-Care Activities: Over the next 120 days Follow up with previous referral to the care guide for housing resources, they will follow up with you  Contact clinic with any questions or concerns Utilize resources discussed     Patient Stated     01/07/2020, no goals     Patient Stated     01/09/2021, no goals     Quit Smoking     Smoking cessation discussed      Quit smoking / using tobacco     SMoking cessation disscussed        This is a list of the screening recommended for you and due dates:  Health Maintenance  Topic Date Due   COVID-19 Vaccine (5 - Pfizer series) 12/22/2020   Flu Shot  11/03/2021   Pneumonia Vaccine (3 - PPSV23 or PCV20) 03/20/2022   Zoster (Shingles) Vaccine (1 of 2) 04/14/2022*   Tetanus Vaccine  10/25/2023   Colon Cancer Screening  11/30/2025   Hepatitis C Screening: USPSTF Recommendation to screen - Ages 18-79 yo.  Completed   HPV Vaccine  Aged Out  *Topic was postponed. The date shown is not the original due date.    Advanced directives: Education provided  Conditions/risks identified:   Next appointment: Follow up in one year for your annual wellness visit. 03-25-2022  @ 9:00 Tyler Cohen  Preventive Care 66 Years and Older, Male  Preventive care refers to lifestyle choices and visits with your health care provider that can promote health and wellness. What does preventive care include? A  yearly physical exam. This is also called an annual well check. Dental exams once or twice a year. Routine eye exams. Ask your health care provider how often you should have your eyes checked. Personal lifestyle choices, including: Daily care of your teeth and gums. Regular physical activity. Eating a healthy diet. Avoiding tobacco and drug use. Limiting alcohol use. Practicing safe sex. Taking low doses of aspirin every day. Taking vitamin and mineral supplements as recommended by your health care provider. What happens during an annual well check? The services and screenings done by your health care provider during your annual well check will depend on your age, overall health, lifestyle risk factors, and family history of disease. Counseling  Your health care provider may ask you questions about your: Alcohol use. Tobacco use. Drug use. Emotional well-being. Home and relationship well-being. Sexual activity. Eating habits. History of falls. Memory and ability to understand (cognition). Work and work Statistician. Screening  You may have the following tests or measurements: Height, weight, and BMI. Blood pressure. Lipid and cholesterol levels. These may be checked every  5 years, or more frequently if you are over 105 years old. Skin check. Lung cancer screening. You may have this screening every year starting at age 62 if you have a 30-pack-year history of smoking and currently smoke or have quit within the past 15 years. Fecal occult blood test (FOBT) of the stool. You may have this test every year starting at age 62. Flexible sigmoidoscopy or colonoscopy. You may have a sigmoidoscopy every 5 years or a colonoscopy every 10 years starting at age 14. Prostate cancer screening. Recommendations will vary depending on your family history and other risks. Hepatitis C blood test. Hepatitis B blood test. Sexually transmitted disease (STD) testing. Diabetes screening. This is done by  checking your blood sugar (glucose) after you have not eaten for a while (fasting). You may have this done every 1-3 years. Abdominal aortic aneurysm (AAA) screening. You may need this if you are a current or former smoker. Osteoporosis. You may be screened starting at age 79 if you are at high risk. Talk with your health care provider about your test results, treatment options, and if necessary, the need for more tests. Vaccines  Your health care provider may recommend certain vaccines, such as: Influenza vaccine. This is recommended every year. Tetanus, diphtheria, and acellular pertussis (Tdap, Td) vaccine. You may need a Td booster every 10 years. Zoster vaccine. You may need this after age 26. Pneumococcal 13-valent conjugate (PCV13) vaccine. One dose is recommended after age 51. Pneumococcal polysaccharide (PPSV23) vaccine. One dose is recommended after age 58. Talk to your health care provider about which screenings and vaccines you need and how often you need them. This information is not intended to replace advice given to you by your health care provider. Make sure you discuss any questions you have with your health care provider. Document Released: 04/18/2015 Document Revised: 12/10/2015 Document Reviewed: 01/21/2015 Elsevier Interactive Patient Education  2017 [place] Prevention in the Home Falls can cause injuries. They can happen to people of all ages. There are many things you can do to make your home safe and to help prevent falls. What can I do on the outside of my home? Regularly fix the edges of walkways and driveways and fix any cracks. Remove anything that might make you trip as you walk through a door, such as a raised step or threshold. Trim any bushes or trees on the path to your home. Use bright outdoor lighting. Clear any walking paths of anything that might make someone trip, such as rocks or tools. Regularly check to see if handrails are loose or  broken. Make sure that both sides of any steps have handrails. Any raised decks and porches should have guardrails on the edges. Have any leaves, snow, or ice cleared regularly. Use sand or salt on walking paths during winter. Clean up any spills in your garage right away. This includes oil or grease spills. What can I do in the bathroom? Use night lights. Install grab bars by the toilet and in the tub and shower. Do not use towel bars as grab bars. Use non-skid mats or decals in the tub or shower. If you need to sit down in the shower, use a plastic, non-slip stool. Keep the floor dry. Clean up any water that spills on the floor as soon as it happens. Remove soap buildup in the tub or shower regularly. Attach bath mats securely with double-sided non-slip rug tape. Do not have throw rugs and other things on  the floor that can make you trip. What can I do in the bedroom? Use night lights. Make sure that you have a light by your bed that is easy to reach. Do not use any sheets or blankets that are too big for your bed. They should not hang down onto the floor. Have a firm chair that has side arms. You can use this for support while you get dressed. Do not have throw rugs and other things on the floor that can make you trip. What can I do in the kitchen? Clean up any spills right away. Avoid walking on wet floors. Keep items that you use a lot in easy-to-reach places. If you need to reach something above you, use a strong step stool that has a grab bar. Keep electrical cords out of the way. Do not use floor polish or wax that makes floors slippery. If you must use wax, use non-skid floor wax. Do not have throw rugs and other things on the floor that can make you trip. What can I do with my stairs? Do not leave any items on the stairs. Make sure that there are handrails on both sides of the stairs and use them. Fix handrails that are broken or loose. Make sure that handrails are as long as  the stairways. Check any carpeting to make sure that it is firmly attached to the stairs. Fix any carpet that is loose or worn. Avoid having throw rugs at the top or bottom of the stairs. If you do have throw rugs, attach them to the floor with carpet tape. Make sure that you have a light switch at the top of the stairs and the bottom of the stairs. If you do not have them, ask someone to add them for you. What else can I do to help prevent falls? Wear shoes that: Do not have high heels. Have rubber bottoms. Are comfortable and fit you well. Are closed at the toe. Do not wear sandals. If you use a stepladder: Make sure that it is fully opened. Do not climb a closed stepladder. Make sure that both sides of the stepladder are locked into place. Ask someone to hold it for you, if possible. Clearly mark and make sure that you can see: Any grab bars or handrails. First and last steps. Where the edge of each step is. Use tools that help you move around (mobility aids) if they are needed. These include: Canes. Walkers. Scooters. Crutches. Turn on the lights when you go into a dark area. Replace any light bulbs as soon as they burn out. Set up your furniture so you have a clear path. Avoid moving your furniture around. If any of your floors are uneven, fix them. If there are any pets around you, be aware of where they are. Review your medicines with your doctor. Some medicines can make you feel dizzy. This can increase your chance of falling. Ask your doctor what other things that you can do to help prevent falls. This information is not intended to replace advice given to you by your health care provider. Make sure you discuss any questions you have with your health care provider. Document Released: 01/16/2009 Document Revised: 08/28/2015 Document Reviewed: 04/26/2014 Elsevier Interactive Patient Education  2017 [employer].

## 2022-01-12 NOTE — Progress Notes (Signed)
Subjective:   Tyler Cohen is a 66 y.o. male who presents for Medicare Annual/Subsequent preventive examination.  I connected with  Tyler Cohen on 01/12/22 by a telephone enabled telemedicine application and verified that I am speaking with the correct person using two identifiers.   I discussed the limitations of evaluation and management by telemedicine. The patient expressed understanding and agreed to proceed.  Patient location: home  Provider location: Tele-health-home     Review of Systems     Cardiac Risk Factors include: advanced age (>79mn, >>64women);smoking/ tobacco exposure;male gender;hypertension     Objective:    Today's Vitals   There is no height or weight on file to calculate BMI.     01/12/2022   10:33 AM 10/30/2021    8:06 AM 05/13/2021   12:10 PM 01/09/2021    9:53 AM 01/07/2020    9:04 AM 04/30/2018    7:34 PM 11/07/2017   10:06 AM  Advanced Directives  Does Patient Have a Medical Advance Directive? No No No Yes No No No  Type of AScientist, research (medical)Living will     Copy of H[place] V[place] Chart?    No - copy requested     Would patient like information on creating a medical advance directive? No - Patient declined No - Patient declined No - Patient declined    Yes (MAU/Ambulatory/Procedural Areas - Information given)    Current Medications (verified) Outpatient Encounter Medications as of 01/12/2022  Medication Sig   digoxin (LANOXIN) 0.25 MG tablet Take 1 tablet (0.25 mg total) by mouth daily.   ELIQUIS 5 MG TABS tablet Take 5 mg by mouth 2 (two) times daily.   empagliflozin (JARDIANCE) 10 MG TABS tablet Take 1 tablet by mouth daily.   furosemide (LASIX) 40 MG tablet Take 1 tablet by mouth daily.   methimazole (TAPAZOLE) 10 MG tablet Take 10 mg by mouth daily.   metoprolol tartrate (LOPRESSOR) 50 MG tablet Take '50mg'$  tablet TWO hours prior to your cardiac CT scan.   polyethylene glycol (MIRALAX /  GLYCOLAX) 17 g packet Take 17 g by mouth daily as needed for moderate constipation.   spironolactone (ALDACTONE) 25 MG tablet Take 12.5 mg by mouth daily.   amiodarone (PACERONE) 200 MG tablet Take by mouth.   No facility-administered encounter medications on file as of 01/12/2022.    Allergies (verified) Lactose intolerance (gi)   History: Past Medical History:  Diagnosis Date   Arthritis    Heart murmur    Hyperlipidemia    Macrocytosis    Thyroid disease    Tobacco abuse    Past Surgical History:  Procedure Laterality Date   COLONOSCOPY WITH PROPOFOL N/A 12/01/2015   Procedure: COLONOSCOPY WITH PROPOFOL;  Surgeon: DLucilla Lame MD;  [facility]  Service: Endoscopy;  Laterality: N/A;   POLYPECTOMY  12/01/2015   Procedure: POLYPECTOMY;  Surgeon: DLucilla Lame MD;  [place] [facility]  Service: Endoscopy;;   Family History  Problem Relation Age of Onset   Hypertension Mother    Diabetes Mother        lost both legs   Kidney disease Mother    Hypertension Father    Emphysema Father    Sickle cell trait Father    Hypertension Brother    Hyperlipidemia Brother    Sickle cell trait Sister    Social History   Socioeconomic History   Marital status: Married  Spouse name: Not on file   Number of children: Not on file   Years of education: Not on file   Highest education level: High school graduate  Occupational History   Not on file  Tobacco Use   Smoking status: Former    Packs/day: 0.25    Years: 42.00    Total pack years: 10.50    Types: Cigars, Cigarettes   Smokeless tobacco: Never   Tobacco comments:    4 cigars a day   Vaping Use   Vaping Use: Never used  Substance and Sexual Activity   Alcohol use: No   Drug use: Not Currently    Types: Marijuana    Comment: pt states he smokes every once in a while   Sexual activity: Yes    Birth control/protection: None  Other Topics Concern   Not on file  Social History Narrative    Works part time.   Social Determinants of Health   Financial Resource Strain: High Risk (01/12/2022)   Overall Financial Resource Strain (CARDIA)    Difficulty of Paying Living Expenses: Hard  Food Insecurity: Food Insecurity Present (01/12/2022)   Hunger Vital Sign    Worried About Running Out of Food in the Last Year: Sometimes true    Ran Out of Food in the Last Year: Sometimes true  Transportation Needs: No Transportation Needs (01/12/2022)   PRAPARE - Hydrologist (Medical): No    Lack of Transportation (Non-Medical): No  Physical Activity: Insufficiently Active (01/12/2022)   Exercise Vital Sign    Days of Exercise per Week: 3 days    Minutes of Exercise per Session: 30 min  Stress: Stress Concern Present (01/12/2022)   [place]    Feeling of Stress : To some extent  Social Connections: Moderately Integrated (01/12/2022)   Social Connection and Isolation Panel [NHANES]    Frequency of Communication with Friends and Family: Twice a week    Frequency of Social Gatherings with Friends and Family: Never    Attends Religious Services: More than 4 times per year    Active Member of [employer] or Organizations: Yes    Attends Music therapist: More than 4 times per year    Marital Status: Married    Tobacco Counseling Counseling given: Not Answered Tobacco comments: 4 cigars a day    Clinical Intake:  Pre-visit preparation completed: Yes  Pain : No/denies pain     Diabetes: No  How often do you need to have someone help you when you read instructions, pamphlets, or other written materials from your doctor or pharmacy?: 1 - Never  Diabetic?  no     Information entered by :: Tyler Kennedy  LPN   Activities of Daily Living    01/12/2022   10:35 AM 05/14/2021    1:30 AM  In your present state of health, do you have any difficulty performing the following  activities:  Hearing? 0 0  Vision? 0 0  Difficulty concentrating or making decisions? 0 0  Walking or climbing stairs? 0 0  Dressing or bathing? 0 0  Doing errands, shopping? 0 0  Preparing Food and eating ? N   Using the Toilet? N   In the past six months, have you accidently leaked urine? N   Do you have problems with loss of bowel control? N   Managing your Medications? N   Managing your Finances? N   Housekeeping  or managing your Housekeeping? N     Patient Care Team: Valerie Roys, DO as PCP - General (Family Medicine)  Indicate any recent Medical Services you may have received from other than [facility] providers in the past year (date may be approximate).     Assessment:   This is a routine wellness examination for [place].  Hearing/Vision screen Hearing Screening - Comments:: No hearing issues Vision Screening - Comments:: Up to date [facility]  Dietary issues and exercise activities discussed: Current Exercise Habits: Home exercise routine, Type of exercise: walking, Time (Minutes): 30, Frequency (Times/Week): 4, Weekly Exercise (Minutes/Week): 120, Intensity: Mild   Goals Addressed             This Visit's Progress    Increase physical activity       By riding bike  more       Depression Screen    01/12/2022   10:42 AM 06/29/2021    9:50 AM 05/29/2021    9:13 AM 01/09/2021    9:55 AM 01/07/2021   10:21 AM 08/13/2020    9:48 AM 01/14/2020    9:58 AM  PHQ 2/9 Scores  PHQ - 2 Score 2 0 0 0 0 0 0  PHQ- 9 Score 2 0 0        Fall Risk    01/12/2022   10:33 AM 01/09/2021    9:54 AM 08/13/2020    9:47 AM 01/14/2020    9:58 AM 01/07/2020    9:05 AM  Fall Risk   Falls in the past year? 0 0 0 0 0  Number falls in past yr: 0  0 0   Injury with Fall? 0  0 0   Risk for fall due to :  No Fall Risks   Medication side effect  Follow up Falls evaluation completed;Education provided;Falls prevention discussed Falls evaluation completed;Education provided;Falls  prevention discussed Falls evaluation completed Falls evaluation completed Falls evaluation completed;Education provided;Falls prevention discussed    FALL RISK PREVENTION PERTAINING TO THE HOME:  Any stairs in or around the home? Yes  If so, are there any without handrails? No  Home free of loose throw rugs in walkways, pet beds, electrical cords, etc? Yes  Adequate lighting in your home to reduce risk of falls? Yes   ASSISTIVE DEVICES UTILIZED TO PREVENT FALLS:  Life alert? No  Use of a cane, walker or w/c? No  Grab bars in the bathroom? No  Shower chair or bench in shower? No  Elevated toilet seat or a handicapped toilet? No   TIMED UP AND GO:  Was the test performed? No .    Cognitive Function:        01/12/2022   10:34 AM 01/09/2021    9:56 AM 01/07/2020    9:07 AM 12/26/2018    9:18 AM 10/27/2018    2:31 PM  6CIT Screen  What Year? 0 points 0 points 0 points 0 points 0 points  What month? 0 points 0 points 0 points 0 points 0 points  What time? 0 points 0 points 0 points 0 points 0 points  Count back from 20 0 points 0 points 0 points 2 points 0 points  Months in reverse 2 points 0 points 0 points 0 points 0 points  Repeat phrase 2 points 8 points 0 points 2 points 4 points  Total Score 4 points 8 points 0 points 4 points 4 points    Immunizations Immunization History  Administered Date(s) Administered  Fluad Quad(high Dose 65+) 03/10/2021   Influenza,inj,Quad PF,6+ Mos 02/17/2016, 05/04/2017, 03/10/2018, 12/26/2018, 01/14/2020   Influenza-Unspecified 01/21/2021   PFIZER(Purple Top)SARS-COV-2 Vaccination 07/01/2019, 07/24/2019, 05/15/2020, 10/27/2020   Pneumococcal Conjugate-13 03/20/2021   Pneumococcal Polysaccharide-23 01/23/2014   Tdap 10/24/2013    TDAP status: Up to date  Flu Vaccine status: Due, Education has been provided regarding the importance of this vaccine. Advised may receive this vaccine at local pharmacy or Health Dept. Aware to provide a  copy of the vaccination record if obtained from local pharmacy or Health Dept. Verbalized acceptance and understanding.  Pneumococcal vaccine status: Up to date  Covid-19 vaccine status: Information provided on how to obtain vaccines.   Qualifies for Shingles Vaccine? Yes   Zostavax completed No   Shingrix Completed?: No.    Education has been provided regarding the importance of this vaccine. Patient has been advised to call insurance company to determine out of pocket expense if they have not yet received this vaccine. Advised may also receive vaccine at local pharmacy or Health Dept. Verbalized acceptance and understanding.  Screening Tests Health Maintenance  Topic Date Due   COVID-19 Vaccine (5 - Pfizer series) 12/22/2020   INFLUENZA VACCINE  11/03/2021   Pneumonia Vaccine 21+ Years old (3 - PPSV23 or PCV20) 03/20/2022   Zoster Vaccines- Shingrix (1 of 2) 04/14/2022 (Originally 11/20/2005)   TETANUS/TDAP  10/25/2023   COLONOSCOPY (Pts 45-41yr Insurance coverage will need to be confirmed)  11/30/2025   Hepatitis C Screening  Completed   HPV VACCINES  Aged Out    Health Maintenance  Health Maintenance Due  Topic Date Due   COVID-19 Vaccine (5 - Pfizer series) 12/22/2020   INFLUENZA VACCINE  11/03/2021   Pneumonia Vaccine 66 Years old (3 - PPSV23 or PCV20) 03/20/2022    Colorectal cancer screening: Type of screening: Colonoscopy. Completed 2017. Repeat every 10 years  Lung Cancer Screening: (Low Dose CT Chest recommended if Age 66-80years, 30 pack-year currently smoking OR have quit w/in 15years.) does not qualify.   Lung Cancer Screening Referral:   Additional Screening:  Hepatitis C Screening: does not qualify; Completed 2017  Vision Screening: Recommended annual ophthalmology exams for early detection of glaucoma and other disorders of the eye. Is the patient up to date with their annual eye exam?  Yes  Who is the provider or what is the name of the office in which  the patient attends annual eye exams? [facility] If pt is not established with a provider, would they like to be referred to a provider to establish care? No .   Dental Screening: Recommended annual dental exams for proper oral hygiene  Community Resource Referral / Chronic Care Management: CRR required this visit?  No   CCM required this visit?  No      Plan:     I have personally reviewed and noted the following in the patient's chart:   Medical and social history Use of alcohol, tobacco or illicit drugs  Current medications and supplements including opioid prescriptions. Patient is not currently taking opioid prescriptions. Functional ability and status Nutritional status Physical activity Advanced directives List of other physicians Hospitalizations, surgeries, and ER visits in previous 12 months Vitals Screenings to include cognitive, depression, and falls Referrals and appointments  In addition, I have reviewed and discussed with patient certain preventive protocols, quality metrics, and best practice recommendations. A written personalized care plan for preventive services as well as general preventive health recommendations were provided to patient.  Tyler Kennedy, LPN   [id number]   Nurse Notes:

## 2022-01-13 DIAGNOSIS — I480 Paroxysmal atrial fibrillation: Secondary | ICD-10-CM | POA: Diagnosis not present

## 2022-01-13 DIAGNOSIS — I5020 Unspecified systolic (congestive) heart failure: Secondary | ICD-10-CM | POA: Diagnosis not present

## 2022-01-13 DIAGNOSIS — I700 Atherosclerosis of aorta: Secondary | ICD-10-CM | POA: Diagnosis not present

## 2022-01-13 DIAGNOSIS — E0521 Thyrotoxicosis with toxic multinodular goiter with thyrotoxic crisis or storm: Secondary | ICD-10-CM | POA: Diagnosis not present

## 2022-01-13 DIAGNOSIS — Z8679 Personal history of other diseases of the circulatory system: Secondary | ICD-10-CM | POA: Diagnosis not present

## 2022-01-16 ENCOUNTER — Other Ambulatory Visit: Payer: Self-pay | Admitting: Family Medicine

## 2022-01-18 NOTE — Telephone Encounter (Signed)
Requested medications are due for refill today.  unsure  Requested medications are on the active medications list.  Yes - as historical  Last refill. 10/13/2021  Future visit scheduled.   yes  Notes to clinic.  Refill not delegated. Medication is listed as historical.    Requested Prescriptions  Pending Prescriptions Disp Refills   methimazole (TAPAZOLE) 10 MG tablet [Pharmacy Med Name: METHIMAZOLE '10MG'$  TABLETS] 180 tablet     Sig: TAKE 1 TABLET(10 MG) BY MOUTH EVERY DAY     Not Delegated - Endocrinology:  Hyperthyroid Agents Failed - 01/16/2022  9:01 AM      Failed - This refill cannot be delegated      Failed - TSH in normal range and within 180 days    TSH  Date Value Ref Range Status  05/29/2021 <0.005 (L) 0.450 - 4.500 uIU/mL Final         Failed - T3 Total in normal range and within 180 days    T3, Free  Date Value Ref Range Status  05/13/2021 8.1 (H) 2.0 - 4.4 pg/mL Final    Comment:    (NOTE) Performed At: [facility] [place], [place] [id number] Rush Farmer MD [id number]          Failed - T4 free in normal range and within 180 days    T4, Total  Date Value Ref Range Status  05/29/2021 10.2 4.5 - 12.0 ug/dL Final   Free T4  Date Value Ref Range Status  05/13/2021 3.49 (H) 0.61 - 1.12 ng/dL Final    Comment:    (NOTE) Biotin ingestion may interfere with free T4 tests. If the results are inconsistent with the TSH level, previous test results, or the clinical presentation, then consider biotin interference. If needed, order repeat testing after stopping biotin. Performed at [facility], [address] [place],  [address]          Passed - Valid encounter within last 6 months    Recent Outpatient Visits           2 months ago Abscess of right little finger   [place], [address], DO   6 months ago Hyperthyroidism   [place], [place], DO   7 months ago  Hyperthyroidism   [place], Megan P, DO   10 months ago Routine general medical examination at a health care facility   [place], [place], DO   1 year ago Hyperthyroidism   [facility] Valerie Roys, DO       Future Appointments             In 2 months [name], Barb Merino, DO [facility]

## 2022-01-20 ENCOUNTER — Other Ambulatory Visit: Payer: Self-pay | Admitting: Family Medicine

## 2022-01-20 NOTE — Telephone Encounter (Signed)
Requested medication (s) are due for refill today: -  Requested medication (s) are on the active medication list: historical med  Last refill:  11/17/21  Future visit scheduled: yes  Notes to clinic:  historical med and provider   Requested Prescriptions  Pending Prescriptions Disp Refills   methimazole (TAPAZOLE) 10 MG tablet [Pharmacy Med Name: METHIMAZOLE '10MG'$  TABLETS] 180 tablet     Sig: TAKE 1 TABLET(10 MG) BY MOUTH EVERY DAY     Not Delegated - Endocrinology:  Hyperthyroid Agents Failed - 01/20/2022  1:19 PM      Failed - This refill cannot be delegated      Failed - TSH in normal range and within 180 days    TSH  Date Value Ref Range Status  05/29/2021 <0.005 (L) 0.450 - 4.500 uIU/mL Final         Failed - T3 Total in normal range and within 180 days    T3, Free  Date Value Ref Range Status  05/13/2021 8.1 (H) 2.0 - 4.4 pg/mL Final    Comment:    (NOTE) Performed At: [facility] [place], [place] [id number] Rush Farmer MD [id number]          Failed - T4 free in normal range and within 180 days    T4, Total  Date Value Ref Range Status  05/29/2021 10.2 4.5 - 12.0 ug/dL Final   Free T4  Date Value Ref Range Status  05/13/2021 3.49 (H) 0.61 - 1.12 ng/dL Final    Comment:    (NOTE) Biotin ingestion may interfere with free T4 tests. If the results are inconsistent with the TSH level, previous test results, or the clinical presentation, then consider biotin interference. If needed, order repeat testing after stopping biotin. Performed at [facility], [address], [place], [place] [address]          Passed - Valid encounter within last 6 months    Recent Outpatient Visits           2 months ago Abscess of right little finger   [place], [place], DO   6 months ago Hyperthyroidism   [place], [place], DO   7 months ago Hyperthyroidism   [place], Megan P, DO   10 months ago Routine general medical examination at a health care facility   [place], [place], DO   1 year ago Hyperthyroidism   [facility] Valerie Roys, DO       Future Appointments             In 2 months [name], Barb Merino, DO [facility], [facility]

## 2022-01-21 NOTE — Telephone Encounter (Signed)
He needs to get this from his endocrinologist

## 2022-01-22 ENCOUNTER — Telehealth: Payer: Self-pay | Admitting: Family Medicine

## 2022-01-22 NOTE — Telephone Encounter (Signed)
PT came into the office said he only has one pill left for his thyroid issues.  He said he went to the pharmacy and he didn't have any refills.  I advised him to get the pharmacy to reach out to the office and that I would send a call back to see what we could do.  Please advise.

## 2022-01-22 NOTE — Telephone Encounter (Signed)
Called patient to make aware per provider medication needs to be calling in by endocrinologist. Patient verbalized understanding.

## 2022-01-22 NOTE — Telephone Encounter (Signed)
Patient is aware, states he will call his endocrinologist.

## 2022-03-25 ENCOUNTER — Encounter: Payer: Self-pay | Admitting: Family Medicine

## 2022-03-25 ENCOUNTER — Ambulatory Visit (INDEPENDENT_AMBULATORY_CARE_PROVIDER_SITE_OTHER): Payer: Medicare (Managed Care) | Admitting: Family Medicine

## 2022-03-25 VITALS — BP 121/81 | HR 75 | Temp 98.1°F | Ht 73.0 in | Wt 161.7 lb

## 2022-03-25 DIAGNOSIS — Z0000 Encounter for general adult medical examination without abnormal findings: Secondary | ICD-10-CM

## 2022-03-25 DIAGNOSIS — E0590 Thyrotoxicosis, unspecified without thyrotoxic crisis or storm: Secondary | ICD-10-CM

## 2022-03-25 DIAGNOSIS — Z720 Tobacco use: Secondary | ICD-10-CM

## 2022-03-25 DIAGNOSIS — I700 Atherosclerosis of aorta: Secondary | ICD-10-CM | POA: Diagnosis not present

## 2022-03-25 DIAGNOSIS — N401 Enlarged prostate with lower urinary tract symptoms: Secondary | ICD-10-CM | POA: Diagnosis not present

## 2022-03-25 DIAGNOSIS — I4891 Unspecified atrial fibrillation: Secondary | ICD-10-CM | POA: Diagnosis not present

## 2022-03-25 DIAGNOSIS — E7800 Pure hypercholesterolemia, unspecified: Secondary | ICD-10-CM | POA: Diagnosis not present

## 2022-03-25 DIAGNOSIS — R350 Frequency of micturition: Secondary | ICD-10-CM

## 2022-03-25 DIAGNOSIS — Z23 Encounter for immunization: Secondary | ICD-10-CM

## 2022-03-25 DIAGNOSIS — I509 Heart failure, unspecified: Secondary | ICD-10-CM

## 2022-03-25 DIAGNOSIS — D7589 Other specified diseases of blood and blood-forming organs: Secondary | ICD-10-CM | POA: Diagnosis not present

## 2022-03-25 LAB — URINALYSIS, ROUTINE W REFLEX MICROSCOPIC
Bilirubin, UA: NEGATIVE
Ketones, UA: NEGATIVE
Leukocytes,UA: NEGATIVE
Nitrite, UA: NEGATIVE
Protein,UA: NEGATIVE
RBC, UA: NEGATIVE
Specific Gravity, UA: 1.01 (ref 1.005–1.030)
Urobilinogen, Ur: 0.2 mg/dL (ref 0.2–1.0)
pH, UA: 7 (ref 5.0–7.5)

## 2022-03-25 LAB — MICROALBUMIN, URINE WAIVED
Creatinine, Urine Waived: 50 mg/dL (ref 10–300)
Microalb, Ur Waived: 10 mg/L (ref 0–19)

## 2022-03-25 MED ORDER — SPIRONOLACTONE 25 MG PO TABS: 12.5000 mg | ORAL_TABLET | Freq: Every day | ORAL | 1 refills | Status: DC

## 2022-03-25 MED ORDER — ATORVASTATIN CALCIUM 20 MG PO TABS: ORAL_TABLET | ORAL | 1 refills | Status: DC

## 2022-03-25 MED ORDER — TAMSULOSIN HCL 0.4 MG PO CAPS: 0.4000 mg | ORAL_CAPSULE | Freq: Every day | ORAL | 1 refills | Status: DC

## 2022-03-25 MED ORDER — METHIMAZOLE 10 MG PO TABS: 10.0000 mg | ORAL_TABLET | Freq: Every day | ORAL | 1 refills | Status: DC

## 2022-03-25 MED ORDER — DIGOXIN 125 MCG PO TABS
125.0000 ug | ORAL_TABLET | Freq: Every day | ORAL | 1 refills | Status: DC
Start: 2022-03-25 — End: 2022-09-30

## 2022-03-25 MED ORDER — EMPAGLIFLOZIN 10 MG PO TABS: 10.0000 mg | ORAL_TABLET | Freq: Every day | ORAL | 1 refills | Status: DC

## 2022-03-25 MED ORDER — ELIQUIS 5 MG PO TABS: 5.0000 mg | ORAL_TABLET | Freq: Two times a day (BID) | ORAL | 1 refills | Status: DC

## 2022-03-25 MED ORDER — FUROSEMIDE 40 MG PO TABS: 40.0000 mg | ORAL_TABLET | Freq: Every day | ORAL | 1 refills | Status: DC

## 2022-03-25 NOTE — Progress Notes (Signed)
BP 121/81   Pulse 75   Temp 98.1 F (36.7 C) (Oral)   Ht '6\' 1"'$  (1.854 m)   Wt 161 lb 11.2 oz (73.3 kg)   SpO2 97%   BMI 21.33 kg/m    Subjective:    Patient ID: Tyler Cohen, male    DOB: 1955/11/16, 66 y.o.   MRN: [id number]  HPI: Tyler Cohen is a 66 y.o. male presenting on 03/25/2022 for comprehensive medical examination. Current medical complaints include:  He notes that he has been off of 90% of his medicine for at least a week. He is not sure why he's off his medicines. He has only been taking his eliquis and his jardiance right now.   HYPERLIPIDEMIA Hyperlipidemia status: poor compliance Satisfied with current treatment?  yes Side effects:  no Medication compliance: poor compliance Past cholesterol meds: atorvastatin Supplements: none Aspirin:  no The ASCVD Risk score (Arnett DK, et al., 2019) failed to calculate for the following reasons:   The valid total cholesterol range is 130 to 320 mg/dL Chest pain:  no  HYPERTHYROIDISM- has been off his thyroid medicine for about a week Thyroid control status: unsure Satisfied with current treatment? yes Medication side effects: no Medication compliance: fair compliance Recent dose adjustment:no Fatigue: no Cold intolerance: no Heat intolerance: no Weight gain: no Weight loss: no Constipation: no Diarrhea/loose stools: no Palpitations: no Lower extremity edema: no Anxiety/depressed mood: no  BPH BPH status: uncontrolled Satisfied with current treatment?: no Medication side effects: no Medication compliance: fair compliance Duration: chronic Nocturia: 4-5x per night Urinary frequency:no Incomplete voiding: no Urgency: no Weak urinary stream: no Straining to start stream: no Dysuria: no Onset: gradual Severity: mild  Interim Problems from his last visit: no  Depression Screen done today and results listed below:     03/25/2022    9:04 AM 01/12/2022   10:42 AM 06/29/2021    9:50 AM 05/29/2021     9:13 AM 01/09/2021    9:55 AM  Depression screen PHQ 2/9  Decreased Interest 0 0 0 0 0  Down, Depressed, Hopeless 0 2 0 0 0  PHQ - 2 Score 0 2 0 0 0  Altered sleeping 0 0 0 0   Tired, decreased energy 0 0 0 0   Change in appetite 1 0 0 0   Feeling bad or failure about yourself  0 0 0 0   Trouble concentrating 0 0 0 0   Moving slowly or fidgety/restless 0 0 0 0   Suicidal thoughts 0 0 0 0   PHQ-9 Score 1 2 0 0   Difficult doing work/chores Not difficult at all         Past Medical History:  Past Medical History:  Diagnosis Date   Arthritis    Heart murmur    Hyperlipidemia    Macrocytosis    Thyroid disease    Tobacco abuse     Surgical History:  Past Surgical History:  Procedure Laterality Date   COLONOSCOPY WITH PROPOFOL N/A 12/01/2015   Procedure: COLONOSCOPY WITH PROPOFOL;  Surgeon: Lucilla Lame, MD;  Location: [facility];  Service: Endoscopy;  Laterality: N/A;   POLYPECTOMY  12/01/2015   Procedure: POLYPECTOMY;  Surgeon: Lucilla Lame, MD;  Location: [facility];  Service: Endoscopy;;    Medications:  No current outpatient medications on file prior to visit.   No current facility-administered medications on file prior to visit.    Allergies:  Allergies  Allergen Reactions  Lactose Intolerance (Gi)     Social History:  Social History   Socioeconomic History   Marital status: Married    Spouse name: Not on file   Number of children: Not on file   Years of education: Not on file   Highest education level: High school graduate  Occupational History   Not on file  Tobacco Use   Smoking status: Former    Packs/day: 0.25    Years: 42.00    Total pack years: 10.50    Types: Cigars, Cigarettes   Smokeless tobacco: Never   Tobacco comments:    4 cigars a day   Vaping Use   Vaping Use: Never used  Substance and Sexual Activity   Alcohol use: No   Drug use: Not Currently    Types: Marijuana    Comment: pt states he smokes every once in  a while   Sexual activity: Yes    Birth control/protection: None  Other Topics Concern   Not on file  Social History Narrative   Works part time.   Social Determinants of Health   Financial Resource Strain: High Risk (01/12/2022)   Overall Financial Resource Strain (CARDIA)    Difficulty of Paying Living Expenses: Hard  Food Insecurity: Food Insecurity Present (01/12/2022)   Hunger Vital Sign    Worried About Running Out of Food in the Last Year: Sometimes true    Ran Out of Food in the Last Year: Sometimes true  Transportation Needs: No Transportation Needs (01/12/2022)   PRAPARE - Hydrologist (Medical): No    Lack of Transportation (Non-Medical): No  Physical Activity: Insufficiently Active (01/12/2022)   Exercise Vital Sign    Days of Exercise per Week: 3 days    Minutes of Exercise per Session: 30 min  Stress: Stress Concern Present (01/12/2022)   [place]    Feeling of Stress : To some extent  Social Connections: Moderately Integrated (01/12/2022)   Social Connection and Isolation Panel [NHANES]    Frequency of Communication with Friends and Family: Twice a week    Frequency of Social Gatherings with Friends and Family: Never    Attends Religious Services: More than 4 times per year    Active Member of [employer] or Organizations: Yes    Attends Music therapist: More than 4 times per year    Marital Status: Married  Human resources officer Violence: Unknown (01/12/2022)   Humiliation, Afraid, Rape, and Kick questionnaire    Fear of Current or Ex-Partner: No    Emotionally Abused: No    Physically Abused: No    Sexually Abused: Not on file   Social History   Tobacco Use  Smoking Status Former   Packs/day: 0.25   Years: 42.00   Total pack years: 10.50   Types: Cigars, Cigarettes  Smokeless Tobacco Never  Tobacco Comments   4 cigars a day    Social History    Substance and Sexual Activity  Alcohol Use No    Family History:  Family History  Problem Relation Age of Onset   Hypertension Mother    Diabetes Mother        lost both legs   Kidney disease Mother    Hypertension Father    Emphysema Father    Sickle cell trait Father    Hypertension Brother    Hyperlipidemia Brother    Sickle cell trait Sister     Past medical  history, surgical history, medications, allergies, family history and social history reviewed with patient today and changes made to appropriate areas of the chart.   Review of Systems  Constitutional: Negative.   HENT: Negative.    Eyes: Negative.   Respiratory: Negative.    Cardiovascular: Negative.   Gastrointestinal: Negative.   Genitourinary: Negative.   Musculoskeletal:  Positive for back pain. Negative for falls, joint pain, myalgias and neck pain.  Skin: Negative.   Neurological: Negative.   Endo/Heme/Allergies:  Positive for environmental allergies. Negative for polydipsia. Does not bruise/bleed easily.  Psychiatric/Behavioral: Negative.     All other ROS negative except what is listed above and in the HPI.      Objective:    BP 121/81   Pulse 75   Temp 98.1 F (36.7 C) (Oral)   Ht '6\' 1"'$  (1.854 m)   Wt 161 lb 11.2 oz (73.3 kg)   SpO2 97%   BMI 21.33 kg/m   Wt Readings from Last 3 Encounters:  03/25/22 161 lb 11.2 oz (73.3 kg)  11/17/21 160 lb 6.4 oz (72.8 kg)  10/30/21 158 lb 8.2 oz (71.9 kg)    Physical Exam Vitals and nursing note reviewed.  Constitutional:      General: He is not in acute distress.    Appearance: Normal appearance. He is normal weight. He is not ill-appearing, toxic-appearing or diaphoretic.  HENT:     Head: Normocephalic and atraumatic.     Right Ear: Tympanic membrane, ear canal and external ear normal. There is no impacted cerumen.     Left Ear: Tympanic membrane, ear canal and external ear normal. There is no impacted cerumen.     Nose: Nose normal. No  congestion or rhinorrhea.     Mouth/Throat:     Mouth: Mucous membranes are moist.     Pharynx: Oropharynx is clear. No oropharyngeal exudate or posterior oropharyngeal erythema.  Eyes:     General: No scleral icterus.       Right eye: No discharge.        Left eye: No discharge.     Extraocular Movements: Extraocular movements intact.     Conjunctiva/sclera: Conjunctivae normal.     Pupils: Pupils are equal, round, and reactive to light.  Neck:     Vascular: No carotid bruit.  Cardiovascular:     Rate and Rhythm: Normal rate and regular rhythm.     Pulses: Normal pulses.     Heart sounds: No murmur heard.    No friction rub. No gallop.  Pulmonary:     Effort: Pulmonary effort is normal. No respiratory distress.     Breath sounds: Normal breath sounds. No stridor. No wheezing, rhonchi or rales.  Chest:     Chest wall: No tenderness.  Abdominal:     General: Abdomen is flat. Bowel sounds are normal. There is no distension.     Palpations: Abdomen is soft. There is no mass.     Tenderness: There is no abdominal tenderness. There is no right CVA tenderness, left CVA tenderness, guarding or rebound.     Hernia: No hernia is present.  Genitourinary:    Comments: Genital exam deferred with shared decision making Musculoskeletal:        General: No swelling, tenderness, deformity or signs of injury.     Cervical back: Normal range of motion and neck supple. No rigidity. No muscular tenderness.     Right lower leg: No edema.     Left lower leg: No  edema.  Lymphadenopathy:     Cervical: No cervical adenopathy.  Skin:    General: Skin is warm and dry.     Capillary Refill: Capillary refill takes less than 2 seconds.     Coloration: Skin is not jaundiced or pale.     Findings: No bruising, erythema, lesion or rash.  Neurological:     General: No focal deficit present.     Mental Status: He is alert and oriented to person, place, and time.     Cranial Nerves: No cranial nerve  deficit.     Sensory: No sensory deficit.     Motor: No weakness.     Coordination: Coordination normal.     Gait: Gait normal.     Deep Tendon Reflexes: Reflexes normal.  Psychiatric:        Mood and Affect: Mood normal.        Behavior: Behavior normal.        Thought Content: Thought content normal.        Judgment: Judgment normal.     Results for orders placed or performed in visit on 03/25/22  Urinalysis, Routine w reflex microscopic  Result Value Ref Range   Specific Gravity, UA 1.010 1.005 - 1.030   pH, UA 7.0 5.0 - 7.5   Color, UA Yellow Yellow   Appearance Ur Clear Clear   Leukocytes,UA Negative Negative   Protein,UA Negative Negative/Trace   Glucose, UA 2+ (A) Negative   Ketones, UA Negative Negative   RBC, UA Negative Negative   Bilirubin, UA Negative Negative   Urobilinogen, Ur 0.2 0.2 - 1.0 mg/dL   Nitrite, UA Negative Negative   Microscopic Examination Comment   Microalbumin, Urine Waived  Result Value Ref Range   Microalb, Ur Waived 10 0 - 19 mg/L   Creatinine, Urine Waived 50 10 - 300 mg/dL   Microalb/Creat Ratio 30-300 (H) <30 mg/g      Assessment & Plan:   Problem List Items Addressed This Visit       Cardiovascular and Mediastinum   Aortic atherosclerosis ([place])    Has been off his medicine. Will restart his atorvastatin and recheck 1 month. Call with any concerns. Continue to monitor.       Relevant Medications   atorvastatin (LIPITOR) 20 MG tablet   digoxin (LANOXIN) 0.125 MG tablet   ELIQUIS 5 MG TABS tablet   furosemide (LASIX) 40 MG tablet   spironolactone (ALDACTONE) 25 MG tablet   Other Relevant Orders   Comprehensive metabolic panel   Ambulatory referral to Cardiology   Atrial fibrillation ([place])    NSR today. Does not want to go back to see previous cardiologist. Will refer to new cardiologist. Call with any concerns.       Relevant Medications   atorvastatin (LIPITOR) 20 MG tablet   digoxin (LANOXIN) 0.125 MG tablet   ELIQUIS  5 MG TABS tablet   furosemide (LASIX) 40 MG tablet   spironolactone (ALDACTONE) 25 MG tablet   Congestive heart failure (HCC)    Euvolemic today. Continue to monitor. Call with any concerns.       Relevant Medications   atorvastatin (LIPITOR) 20 MG tablet   digoxin (LANOXIN) 0.125 MG tablet   ELIQUIS 5 MG TABS tablet   furosemide (LASIX) 40 MG tablet   spironolactone (ALDACTONE) 25 MG tablet   Other Relevant Orders   Comprehensive metabolic panel   Microalbumin, Urine Waived (Completed)   Ambulatory referral to Cardiology     Endocrine  Hyperthyroidism    Has been off his medicine for at least a week. Called endocrinology to confirm what he should be taking. They noted that he did not have any refills on his medication. He is due to follow up with them in March. Will refill his medicine at this time and recheck 1 month.        Relevant Medications   methimazole (TAPAZOLE) 10 MG tablet   Other Relevant Orders   Comprehensive metabolic panel   TSH     Genitourinary   BPH (benign prostatic hyperplasia)    Not under good control. Will start flomax and recheck 1 month. Call with any concerns.       Relevant Medications   tamsulosin (FLOMAX) 0.4 MG CAPS capsule   Other Relevant Orders   Comprehensive metabolic panel   PSA     Other   Hyperlipidemia    Has been off his medicine. Will restart his atorvastatin. Call with any concerns. Recheck 1 month.      Relevant Medications   atorvastatin (LIPITOR) 20 MG tablet   digoxin (LANOXIN) 0.125 MG tablet   ELIQUIS 5 MG TABS tablet   furosemide (LASIX) 40 MG tablet   spironolactone (ALDACTONE) 25 MG tablet   Other Relevant Orders   Comprehensive metabolic panel   Lipid Panel w/o Chol/HDL Ratio   Tobacco abuse    Not interested in quitting at this time. Continue to monitor.       Relevant Orders   Comprehensive metabolic panel   Urinalysis, Routine w reflex microscopic (Completed)   Macrocytosis    Rechecking labs  today. Await results. Treat as needed.       Relevant Orders   Comprehensive metabolic panel   CBC with Differential/Platelet   Other Visit Diagnoses     Routine general medical examination at a health care facility    -  Primary   Vaccines up to date. Screening labs checked today. Colonoscopy up to date. Continue diet and exercise. Call with any concerns. Continue to monitor.   Needs flu shot       Flu shot given today.   Relevant Orders   Flu Vaccine QUAD High Dose(Fluad) (Completed)        LABORATORY TESTING:  Health maintenance labs ordered today as discussed above.   The natural history of prostate cancer and ongoing controversy regarding screening and potential treatment outcomes of prostate cancer has been discussed with the patient. The meaning of a false positive PSA and a false negative PSA has been discussed. He indicates understanding of the limitations of this screening test and wishes to proceed with screening PSA testing.   IMMUNIZATIONS:   - Tdap: Tetanus vaccination status reviewed: last tetanus booster within 10 years. - Influenza: Administered today - Pneumovax: Administered today - Prevnar: Up to date - COVID: Up to date - HPV: Not applicable - Shingrix vaccine: Refused  SCREENING: - Colonoscopy: Up to date  Discussed with patient purpose of the colonoscopy is to detect colon cancer at curable precancerous or early stages   PATIENT COUNSELING:    Sexuality: Discussed sexually transmitted diseases, partner selection, use of condoms, avoidance of unintended pregnancy  and contraceptive alternatives.   Advised to avoid cigarette smoking.  I discussed with the patient that most people either abstain from alcohol or drink within safe limits (<=14/week and <=4 drinks/occasion for males, <=7/weeks and <= 3 drinks/occasion for females) and that the risk for alcohol disorders and other health effects rises proportionally with the  number of drinks per week and  how often a drinker exceeds daily limits.  Discussed cessation/primary prevention of drug use and availability of treatment for abuse.   Diet: Encouraged to adjust caloric intake to maintain  or achieve ideal body weight, to reduce intake of dietary saturated fat and total fat, to limit sodium intake by avoiding high sodium foods and not adding table salt, and to maintain adequate dietary potassium and calcium preferably from fresh fruits, vegetables, and low-fat dairy products.    stressed the importance of regular exercise  Injury prevention: Discussed safety belts, safety helmets, smoke detector, smoking near bedding or upholstery.   Dental health: Discussed importance of regular tooth brushing, flossing, and dental visits.   Follow up plan: NEXT PREVENTATIVE PHYSICAL DUE IN 1 YEAR. Return in about 4 weeks (around 04/22/2022).  >1 hour spent with patient and in co-ordination of care with patient patient today

## 2022-03-25 NOTE — Assessment & Plan Note (Signed)
Euvolemic today. Continue to monitor. Call with any concerns.

## 2022-03-25 NOTE — Assessment & Plan Note (Signed)
Rechecking labs today. Await results. Treat as needed.  °

## 2022-03-25 NOTE — Assessment & Plan Note (Signed)
Not interested in quitting at this time. Continue to monitor.  

## 2022-03-25 NOTE — Assessment & Plan Note (Signed)
Not under good control. Will start flomax and recheck 1 month. Call with any concerns.

## 2022-03-25 NOTE — Assessment & Plan Note (Signed)
Has been off his medicine for at least a week. Called endocrinology to confirm what he should be taking. They noted that he did not have any refills on his medication. He is due to follow up with them in March. Will refill his medicine at this time and recheck 1 month.

## 2022-03-25 NOTE — Assessment & Plan Note (Signed)
NSR today. Does not want to go back to see previous cardiologist. Will refer to new cardiologist. Call with any concerns.

## 2022-03-25 NOTE — Assessment & Plan Note (Signed)
Has been off his medicine. Will restart his atorvastatin. Call with any concerns. Recheck 1 month.

## 2022-03-25 NOTE — Assessment & Plan Note (Signed)
Has been off his medicine. Will restart his atorvastatin and recheck 1 month. Call with any concerns. Continue to monitor.

## 2022-03-26 ENCOUNTER — Other Ambulatory Visit: Payer: Self-pay | Admitting: Family Medicine

## 2022-03-26 ENCOUNTER — Telehealth: Payer: Self-pay | Admitting: Family Medicine

## 2022-03-26 DIAGNOSIS — R972 Elevated prostate specific antigen [PSA]: Secondary | ICD-10-CM

## 2022-03-26 LAB — CBC WITH DIFFERENTIAL/PLATELET
Basophils Absolute: 0 10*3/uL (ref 0.0–0.2)
Basos: 0 %
EOS (ABSOLUTE): 0.2 10*3/uL (ref 0.0–0.4)
Eos: 4 %
Hematocrit: 48.6 % (ref 37.5–51.0)
Hemoglobin: 15.9 g/dL (ref 13.0–17.7)
Immature Grans (Abs): 0 10*3/uL (ref 0.0–0.1)
Immature Granulocytes: 0 %
Lymphocytes Absolute: 2.3 10*3/uL (ref 0.7–3.1)
Lymphs: 42 %
MCH: 32.3 pg (ref 26.6–33.0)
MCHC: 32.7 g/dL (ref 31.5–35.7)
MCV: 99 fL — ABNORMAL HIGH (ref 79–97)
Monocytes Absolute: 0.4 10*3/uL (ref 0.1–0.9)
Monocytes: 8 %
Neutrophils Absolute: 2.5 10*3/uL (ref 1.4–7.0)
Neutrophils: 46 %
Platelets: 166 10*3/uL (ref 150–450)
RBC: 4.92 x10E6/uL (ref 4.14–5.80)
RDW: 12.1 % (ref 11.6–15.4)
WBC: 5.5 10*3/uL (ref 3.4–10.8)

## 2022-03-26 LAB — PSA: Prostate Specific Ag, Serum: 16.3 ng/mL — ABNORMAL HIGH (ref 0.0–4.0)

## 2022-03-26 LAB — COMPREHENSIVE METABOLIC PANEL
ALT: 24 IU/L (ref 0–44)
AST: 18 IU/L (ref 0–40)
Albumin/Globulin Ratio: 1.5 (ref 1.2–2.2)
Albumin: 4 g/dL (ref 3.9–4.9)
Alkaline Phosphatase: 73 IU/L (ref 44–121)
BUN/Creatinine Ratio: 12 (ref 10–24)
BUN: 12 mg/dL (ref 8–27)
Bilirubin Total: 0.5 mg/dL (ref 0.0–1.2)
CO2: 22 mmol/L (ref 20–29)
Calcium: 9.6 mg/dL (ref 8.6–10.2)
Chloride: 104 mmol/L (ref 96–106)
Creatinine, Ser: 1 mg/dL (ref 0.76–1.27)
Globulin, Total: 2.6 g/dL (ref 1.5–4.5)
Glucose: 73 mg/dL (ref 70–99)
Potassium: 4.1 mmol/L (ref 3.5–5.2)
Sodium: 140 mmol/L (ref 134–144)
Total Protein: 6.6 g/dL (ref 6.0–8.5)
eGFR: 83 mL/min/{1.73_m2} (ref >59)

## 2022-03-26 LAB — LIPID PANEL W/O CHOL/HDL RATIO
Cholesterol, Total: 198 mg/dL (ref 100–199)
HDL: 55 mg/dL (ref >39)
LDL Chol Calc (NIH): 127 mg/dL — ABNORMAL HIGH (ref 0–99)
Triglycerides: 89 mg/dL (ref 0–149)
VLDL Cholesterol Cal: 16 mg/dL (ref 5–40)

## 2022-03-26 LAB — TSH: TSH: <0.005 u[IU]/mL — ABNORMAL LOW (ref 0.450–4.500)

## 2022-03-26 NOTE — Telephone Encounter (Signed)
Copied from [place] [phone]. Topic: General - Other >> Mar 26, 2022 12:41 PM Ja-Kwan M wrote: Reason for CRM: Mandi with [facility] requests that an updated medication list be sent to her attention at [phone]

## 2022-03-26 NOTE — Telephone Encounter (Signed)
Requested paperwork was faxed to attention [facility] to fax contact information.

## 2022-04-22 ENCOUNTER — Encounter: Payer: Self-pay | Admitting: Family Medicine

## 2022-04-22 ENCOUNTER — Ambulatory Visit: Payer: Medicare (Managed Care) | Admitting: Family Medicine

## 2022-04-22 VITALS — BP 110/65 | HR 89 | Temp 97.6°F | Ht 73.0 in | Wt 156.3 lb

## 2022-04-22 DIAGNOSIS — E7800 Pure hypercholesterolemia, unspecified: Secondary | ICD-10-CM | POA: Diagnosis not present

## 2022-04-22 DIAGNOSIS — N401 Enlarged prostate with lower urinary tract symptoms: Secondary | ICD-10-CM

## 2022-04-22 DIAGNOSIS — R350 Frequency of micturition: Secondary | ICD-10-CM | POA: Diagnosis not present

## 2022-04-22 DIAGNOSIS — I700 Atherosclerosis of aorta: Secondary | ICD-10-CM | POA: Diagnosis not present

## 2022-04-22 DIAGNOSIS — E0590 Thyrotoxicosis, unspecified without thyrotoxic crisis or storm: Secondary | ICD-10-CM | POA: Diagnosis not present

## 2022-04-22 MED ORDER — ALBUTEROL SULFATE HFA 108 (90 BASE) MCG/ACT IN AERS: 2.0000 | INHALATION_SPRAY | Freq: Four times a day (QID) | RESPIRATORY_TRACT | 12 refills | Status: DC | PRN

## 2022-04-22 NOTE — Assessment & Plan Note (Signed)
Seeing urology for elevated PSA tomorrow. Symptoms doing well. Rechecking labs today.

## 2022-04-22 NOTE — Assessment & Plan Note (Signed)
Has been taking his medicine again. Rechecking labs. Due to see endocrinology in March. Continue to monitor.

## 2022-04-22 NOTE — Progress Notes (Signed)
BP 110/65   Pulse 89   Temp 97.6 F (36.4 C) (Oral)   Ht '6\' 1"'$  (1.854 m)   Wt 156 lb 4.8 oz (70.9 kg)   SpO2 97%   BMI 20.62 kg/m    Subjective:    Patient ID: Tyler Cohen, male    DOB: [birth year], 67 y.o.   MRN: [id number]  HPI: Tyler Cohen is a 67 y.o. male  No chief complaint on file.  HYPERLIPIDEMIA Hyperlipidemia status: restarted medicine a month ago Satisfied with current treatment?  yes Side effects:  no Medication compliance: good compliance Past cholesterol meds: atorvastatin Supplements: none Aspirin:  no The 10-year ASCVD risk score (Arnett DK, et al., 2019) is: 20.4%   Values used to calculate the score:     Age: 26 years     Sex: Male     Is Non-Hispanic African American: Yes     Diabetic: No     Tobacco smoker: Yes     Systolic Blood Pressure: 825 mmHg     Is BP treated: Yes     HDL Cholesterol: 55 mg/dL     Total Cholesterol: 198 mg/dL Chest pain:  no  BPH BPH status: controlled Satisfied with current treatment?: yes Medication side effects: no Medication compliance: excellent compliance Duration: chronic Nocturia: 2-3x per night Urinary frequency:no Incomplete voiding: yes Urgency: no Weak urinary stream: no Straining to start stream: no Dysuria: no Onset: gradual Severity: mild  HYPERTHYROIDISM Thyroid control status: unsure Satisfied with current treatment? yes Medication side effects: no Medication compliance: good compliance Recent dose adjustment:yes Fatigue: no Cold intolerance: yes Heat intolerance: no Weight gain: no Weight loss: yes Constipation: no Diarrhea/loose stools: no Palpitations: yes Lower extremity edema: no Anxiety/depressed mood: no   Relevant past medical, surgical, family and social history reviewed and updated as indicated. Interim medical history since our last visit reviewed. Allergies and medications reviewed and updated.  Review of Systems  Constitutional: Negative.   Respiratory:  Negative.    Cardiovascular: Negative.   Gastrointestinal: Negative.   Musculoskeletal: Negative.   Neurological: Negative.   Psychiatric/Behavioral: Negative.      Per HPI unless specifically indicated above     Objective:    BP 110/65   Pulse 89   Temp 97.6 F (36.4 C) (Oral)   Ht '6\' 1"'$  (1.854 m)   Wt 156 lb 4.8 oz (70.9 kg)   SpO2 97%   BMI 20.62 kg/m   Wt Readings from Last 3 Encounters:  04/22/22 156 lb 4.8 oz (70.9 kg)  03/25/22 161 lb 11.2 oz (73.3 kg)  11/17/21 160 lb 6.4 oz (72.8 kg)    Physical Exam Vitals and nursing note reviewed.  Constitutional:      General: He is not in acute distress.    Appearance: Normal appearance. He is not ill-appearing, toxic-appearing or diaphoretic.  HENT:     Head: Normocephalic and atraumatic.     Right Ear: External ear normal.     Left Ear: External ear normal.     Nose: Nose normal.     Mouth/Throat:     Mouth: Mucous membranes are moist.     Pharynx: Oropharynx is clear.  Eyes:     General: No scleral icterus.       Right eye: No discharge.        Left eye: No discharge.     Extraocular Movements: Extraocular movements intact.     Conjunctiva/sclera: Conjunctivae normal.     Pupils: Pupils are  equal, round, and reactive to light.  Cardiovascular:     Rate and Rhythm: Normal rate and regular rhythm.     Pulses: Normal pulses.     Heart sounds: Normal heart sounds. No murmur heard.    No friction rub. No gallop.  Pulmonary:     Effort: Pulmonary effort is normal. No respiratory distress.     Breath sounds: Normal breath sounds. No stridor. No wheezing, rhonchi or rales.  Chest:     Chest wall: No tenderness.  Musculoskeletal:        General: Normal range of motion.     Cervical back: Normal range of motion and neck supple.  Skin:    General: Skin is warm and dry.     Capillary Refill: Capillary refill takes less than 2 seconds.     Coloration: Skin is not jaundiced or pale.     Findings: No bruising,  erythema, lesion or rash.  Neurological:     General: No focal deficit present.     Mental Status: He is alert and oriented to person, place, and time. Mental status is at baseline.  Psychiatric:        Mood and Affect: Mood normal.        Behavior: Behavior normal.        Thought Content: Thought content normal.        Judgment: Judgment normal.     Results for orders placed or performed in visit on 03/25/22  Comprehensive metabolic panel  Result Value Ref Range   Glucose 73 70 - 99 mg/dL   BUN 12 8 - 27 mg/dL   Creatinine, Ser 1.00 0.76 - 1.27 mg/dL   eGFR 83 >59 mL/min/1.73   BUN/Creatinine Ratio 12 10 - 24   Sodium 140 134 - 144 mmol/L   Potassium 4.1 3.5 - 5.2 mmol/L   Chloride 104 96 - 106 mmol/L   CO2 22 20 - 29 mmol/L   Calcium 9.6 8.6 - 10.2 mg/dL   Total Protein 6.6 6.0 - 8.5 g/dL   Albumin 4.0 3.9 - 4.9 g/dL   Globulin, Total 2.6 1.5 - 4.5 g/dL   Albumin/Globulin Ratio 1.5 1.2 - 2.2   Bilirubin Total 0.5 0.0 - 1.2 mg/dL   Alkaline Phosphatase 73 44 - 121 IU/L   AST 18 0 - 40 IU/L   ALT 24 0 - 44 IU/L  CBC with Differential/Platelet  Result Value Ref Range   WBC 5.5 3.4 - 10.8 x10E3/uL   RBC 4.92 4.14 - 5.80 x10E6/uL   Hemoglobin 15.9 13.0 - 17.7 g/dL   Hematocrit 48.6 37.5 - 51.0 %   MCV 99 (H) 79 - 97 fL   MCH 32.3 26.6 - 33.0 pg   MCHC 32.7 31.5 - 35.7 g/dL   RDW 12.1 11.6 - 15.4 %   Platelets 166 150 - 450 x10E3/uL   Neutrophils 46 Not Estab. %   Lymphs 42 Not Estab. %   Monocytes 8 Not Estab. %   Eos 4 Not Estab. %   Basos 0 Not Estab. %   Neutrophils Absolute 2.5 1.4 - 7.0 x10E3/uL   Lymphocytes Absolute 2.3 0.7 - 3.1 x10E3/uL   Monocytes Absolute 0.4 0.1 - 0.9 x10E3/uL   EOS (ABSOLUTE) 0.2 0.0 - 0.4 x10E3/uL   Basophils Absolute 0.0 0.0 - 0.2 x10E3/uL   Immature Granulocytes 0 Not Estab. %   Immature Grans (Abs) 0.0 0.0 - 0.1 x10E3/uL  Lipid Panel w/o Chol/HDL Ratio  Result Value Ref Range  Cholesterol, Total 198 100 - 199 mg/dL    Triglycerides 89 0 - 149 mg/dL   HDL 55 >39 mg/dL   VLDL Cholesterol Cal 16 5 - 40 mg/dL   LDL Chol Calc (NIH) 127 (H) 0 - 99 mg/dL  PSA  Result Value Ref Range   Prostate Specific Ag, Serum 16.3 (H) 0.0 - 4.0 ng/mL  TSH  Result Value Ref Range   TSH <0.005 (L) 0.450 - 4.500 uIU/mL  Urinalysis, Routine w reflex microscopic  Result Value Ref Range   Specific Gravity, UA 1.010 1.005 - 1.030   pH, UA 7.0 5.0 - 7.5   Color, UA Yellow Yellow   Appearance Ur Clear Clear   Leukocytes,UA Negative Negative   Protein,UA Negative Negative/Trace   Glucose, UA 2+ (A) Negative   Ketones, UA Negative Negative   RBC, UA Negative Negative   Bilirubin, UA Negative Negative   Urobilinogen, Ur 0.2 0.2 - 1.0 mg/dL   Nitrite, UA Negative Negative   Microscopic Examination Comment   Microalbumin, Urine Waived  Result Value Ref Range   Microalb, Ur Waived 10 0 - 19 mg/L   Creatinine, Urine Waived 50 10 - 300 mg/dL   Microalb/Creat Ratio 30-300 (H) <30 mg/g      Assessment & Plan:   Problem List Items Addressed This Visit       Cardiovascular and Mediastinum   Aortic atherosclerosis ([place]) - Primary    Tolerating his atorvastatin. Will recheck labs today. Await results.       Relevant Orders   Lipid Panel w/o Chol/HDL Ratio   Comprehensive metabolic panel     Endocrine   Hyperthyroidism    Has been taking his medicine again. Rechecking labs. Due to see endocrinology in March. Continue to monitor.       Relevant Orders   Thyroid Panel With TSH   Comprehensive metabolic panel     Genitourinary   BPH (benign prostatic hyperplasia)    Seeing urology for elevated PSA tomorrow. Symptoms doing well. Rechecking labs today.      Relevant Orders   Comprehensive metabolic panel   PSA     Other   Hyperlipidemia    Tolerating his atorvastatin. Will recheck labs today. Await results.       Relevant Orders   Lipid Panel w/o Chol/HDL Ratio   Comprehensive metabolic panel     Follow up  plan: Return in about 3 months (around 07/22/2022).

## 2022-04-22 NOTE — Assessment & Plan Note (Signed)
Tolerating his atorvastatin. Will recheck labs today. Await results.

## 2022-04-23 ENCOUNTER — Encounter: Payer: Self-pay | Admitting: Urology

## 2022-04-23 ENCOUNTER — Ambulatory Visit (INDEPENDENT_AMBULATORY_CARE_PROVIDER_SITE_OTHER): Payer: Medicare (Managed Care) | Admitting: Urology

## 2022-04-23 VITALS — BP 138/71 | HR 91 | Ht 74.0 in | Wt 155.0 lb

## 2022-04-23 DIAGNOSIS — R972 Elevated prostate specific antigen [PSA]: Secondary | ICD-10-CM

## 2022-04-23 LAB — LIPID PANEL W/O CHOL/HDL RATIO
Cholesterol, Total: 139 mg/dL (ref 100–199)
HDL: 47 mg/dL (ref >39)
LDL Chol Calc (NIH): 77 mg/dL (ref 0–99)
Triglycerides: 77 mg/dL (ref 0–149)
VLDL Cholesterol Cal: 15 mg/dL (ref 5–40)

## 2022-04-23 LAB — COMPREHENSIVE METABOLIC PANEL
ALT: 23 IU/L (ref 0–44)
AST: 17 IU/L (ref 0–40)
Albumin/Globulin Ratio: 1.6 (ref 1.2–2.2)
Albumin: 4.2 g/dL (ref 3.9–4.9)
Alkaline Phosphatase: 84 IU/L (ref 44–121)
BUN/Creatinine Ratio: 15 (ref 10–24)
BUN: 15 mg/dL (ref 8–27)
Bilirubin Total: 0.6 mg/dL (ref 0.0–1.2)
CO2: 25 mmol/L (ref 20–29)
Calcium: 9.8 mg/dL (ref 8.6–10.2)
Chloride: 101 mmol/L (ref 96–106)
Creatinine, Ser: 1.02 mg/dL (ref 0.76–1.27)
Globulin, Total: 2.7 g/dL (ref 1.5–4.5)
Glucose: 103 mg/dL — ABNORMAL HIGH (ref 70–99)
Potassium: 4.1 mmol/L (ref 3.5–5.2)
Sodium: 138 mmol/L (ref 134–144)
Total Protein: 6.9 g/dL (ref 6.0–8.5)
eGFR: 81 mL/min/{1.73_m2} (ref >59)

## 2022-04-23 LAB — THYROID PANEL WITH TSH
Free Thyroxine Index: 4.1 (ref 1.2–4.9)
T3 Uptake Ratio: 36 % (ref 24–39)
T4, Total: 11.5 ug/dL (ref 4.5–12.0)
TSH: <0.005 u[IU]/mL — ABNORMAL LOW (ref 0.450–4.500)

## 2022-04-23 LAB — PSA: Prostate Specific Ag, Serum: 20.8 ng/mL — ABNORMAL HIGH (ref 0.0–4.0)

## 2022-04-23 NOTE — Progress Notes (Signed)
04/23/2022 2:26 PM   [name] [birth year] [id number]  Referring provider: Valerie Roys, DO [place],  [place] [address]  Chief Complaint  Patient presents with   Elevated PSA    Urologic history: 1.  Small left renal mass Incidentally identified MRI September 2018 Elected surveillance  HPI: 67 y.o. male referred for elevated PSA  At last year's visit PSA noted to be 9.6 and prostate biopsy was recommended.  The biopsy was scheduled however was canceled twice due to hospitalization for respiratory failure and pneumonia and was transferred to [facility] for thyrotoxicosis He did not reschedule his biopsy after discharge.  PSA rechecked 03/25/2022 and had increased to 16.3.  Repeat PSA 04/22/2022 was 20.8. Mild urinary frequency   PMH: Past Medical History:  Diagnosis Date   Arthritis    Heart murmur    Hyperlipidemia    Macrocytosis    Thyroid disease    Tobacco abuse     Surgical History: Past Surgical History:  Procedure Laterality Date   COLONOSCOPY WITH PROPOFOL N/A 12/01/2015   Procedure: COLONOSCOPY WITH PROPOFOL;  Surgeon: Lucilla Lame, MD;  Location: [facility];  Service: Endoscopy;  Laterality: N/A;   POLYPECTOMY  12/01/2015   Procedure: POLYPECTOMY;  Surgeon: Lucilla Lame, MD;  Location: [facility];  Service: Endoscopy;;    Home Medications:  Allergies as of 04/23/2022       Reactions   Lactose Intolerance (gi)         Medication List        Accurate as of April 23, 2022  2:26 PM. If you have any questions, ask your nurse or doctor.          albuterol 108 (90 Base) MCG/ACT inhaler Commonly known as: VENTOLIN HFA Inhale 2 puffs into the lungs every 6 (six) hours as needed for wheezing or shortness of breath.   atorvastatin 20 MG tablet Commonly known as: LIPITOR TAKE 1 TABLET(20 MG) BY MOUTH AT BEDTIME   digoxin 0.125 MG tablet Commonly known as: LANOXIN Take 1 tablet (125 mcg total) by mouth daily.    Eliquis 5 MG Tabs tablet Generic drug: apixaban Take 1 tablet (5 mg total) by mouth 2 (two) times daily.   empagliflozin 10 MG Tabs tablet Commonly known as: JARDIANCE Take 1 tablet (10 mg total) by mouth daily.   furosemide 40 MG tablet Commonly known as: LASIX Take 1 tablet (40 mg total) by mouth daily.   methimazole 10 MG tablet Commonly known as: TAPAZOLE Take 1 tablet (10 mg total) by mouth daily.   spironolactone 25 MG tablet Commonly known as: ALDACTONE Take 0.5 tablets (12.5 mg total) by mouth daily.   tamsulosin 0.4 MG Caps capsule Commonly known as: FLOMAX Take 1 capsule (0.4 mg total) by mouth daily.        Allergies:  Allergies  Allergen Reactions   Lactose Intolerance (Gi)     Family History: Family History  Problem Relation Age of Onset   Hypertension Mother    Diabetes Mother        lost both legs   Kidney disease Mother    Hypertension Father    Emphysema Father    Sickle cell trait Father    Hypertension Brother    Hyperlipidemia Brother    Sickle cell trait Sister     Social History:  reports that he has quit smoking. His smoking use included cigars and cigarettes. He has a 10.50 pack-year smoking history. He  has never used smokeless tobacco. He reports that he does not currently use drugs after having used the following drugs: Marijuana. He reports that he does not drink alcohol.   Physical Exam: BP 138/71   Pulse 91   Ht '6\' 2"'$  (1.88 m)   Wt 155 lb (70.3 kg)   BMI 19.90 kg/m   Constitutional:  Alert and oriented, No acute distress. HEENT: [place] AT, moist mucus membranes.  Trachea midline, no masses. Cardiovascular: No clubbing, cyanosis, or edema. Respiratory: Normal respiratory effort, no increased work of breathing. GI: Abdomen is soft, nontender, nondistended, no abdominal masses GU: Prostate 40 g, smooth without nodules Skin: No rashes, bruises or suspicious lesions. Neurologic: Grossly intact, no focal deficits, moving all 4  extremities. Psychiatric: Normal mood and affect.   Assessment & Plan:    1.  Elevated PSA Was scheduled for prostate biopsy last year with PSA of 9.6 which was canceled secondary to hospitalization.  He did not reschedule.  His PSA has increased to 20.8 Schedule TRUS/biopsy of the prostate.  Based on medical comorbidities we will plan in OR under sedation.  He will need preoperative clearance to stop Eliquis The procedure was again reviewed including potential risks of bleeding and infection/sepsis.  All questions were answered and he desires to proceed.   Abbie Sons, [place] [address] [place], [place] [address] [phone]

## 2022-04-24 ENCOUNTER — Encounter: Payer: Self-pay | Admitting: Urology

## 2022-04-24 ENCOUNTER — Other Ambulatory Visit: Payer: Self-pay | Admitting: Urology

## 2022-04-24 DIAGNOSIS — R972 Elevated prostate specific antigen [PSA]: Secondary | ICD-10-CM

## 2022-04-24 NOTE — Progress Notes (Signed)
Surgical Physician [facility]  * Scheduling expectation : Next Available  *Length of Case: 30 minutes  *Clearance needed: yes  *Anticoagulation Instructions: Hold all anticoagulants  *Aspirin Instructions: N/A  *Post-op visit Date/Instructions:  1-2 week with pathology review  *Diagnosis: Elevated PSA  *Procedure:  Prostate Biopsy (11886) TRUS ([id number]) [place] GKKDP([id number])   Additional orders: N/A  -Admit type: OUTpatient  -Anesthesia: MAC  -VTE Prophylaxis Standing Order SCD's       Other:   -Standing Lab Orders Per Anesthesia    Lab other: UA&Urine Culture  -Standing Test orders EKG/Chest x-ray per Anesthesia       Test other:   - Medications:  Ceftriaxone(Rocephin) 1gm IV  -Other orders:  N/A

## 2022-04-26 ENCOUNTER — Other Ambulatory Visit: Payer: Self-pay | Admitting: *Deleted

## 2022-04-26 DIAGNOSIS — R972 Elevated prostate specific antigen [PSA]: Secondary | ICD-10-CM

## 2022-04-27 ENCOUNTER — Other Ambulatory Visit: Payer: Self-pay | Admitting: Urology

## 2022-04-27 ENCOUNTER — Telehealth: Payer: Self-pay

## 2022-04-27 DIAGNOSIS — R972 Elevated prostate specific antigen [PSA]: Secondary | ICD-10-CM

## 2022-04-27 NOTE — Telephone Encounter (Signed)
I spoke with [name]. We have discussed possible surgery dates and Tuesday February 20th, 2024 was agreed upon by all parties. Patient given information about surgery date, what to expect pre-operatively and post operatively.  We discussed that a Pre-Admission Testing office will be calling to set up the pre-op visit that will take place prior to surgery, and that these appointments are typically done over the phone with a Pre-Admissions RN. Informed patient that our office will communicate any additional care to be provided after surgery. Patients questions or concerns were discussed during our call. Advised to call our office should there be any additional information, questions or concerns that arise. Patient verbalized understanding.

## 2022-04-27 NOTE — Progress Notes (Signed)
   [facility]  Surgery Date: Date: 05/25/2022  Surgeon: Dr. John Giovanni, MD  Inpt ( No  )   Outpt (Yes)   Obs ( No  )   Diagnosis: R97.20 Elevated Prostate Specific Antigen  -CPT: 55700, [id number], [phone], [phone]  Surgery: Transrectal Ultrasound Guided Prostate Biopsy  Stop Anticoagulations: Yes, patient will need to hold Eliquis  Cardiac/Medical/Pulmonary Clearance needed: Yes  Clearance needed from Dr: Clayborn Bigness  Clearance request sent on: Date: 04/27/22  *Orders entered into EPIC  Date: 04/27/22   *Case booked in [place]  Date: 04/26/2022  *Notified pt of Surgery: Date: 04/26/2022  PRE-OP UA & CX: yes will obtain in clinic on 05/06/2022  *Placed into Prior Authorization Work Fabio Bering Date: 04/27/22  Assistant/laser/rep:Yes, Ultrasound will be present for this case.

## 2022-04-27 NOTE — Progress Notes (Signed)
REQUEST FOR SURGICAL CLEARANCE    Phone Number: [phone] for Surgery Coordinator Fax Number: [phone]   Date: Date: 04/27/22  Faxed to: Dr. Clayborn Bigness, MD  Surgeon: Dr. John Giovanni, MD     Date of Surgery: 05/25/2022  Operation: Transrectal Ultrasound Guided Prostate Biopsy   Anesthesia Type: MAC   Diagnosis: Elevated PSA  Patient Requires:   Cardiac / Vascular Clearance : Yes  Reason: Would like for patient to hold Eliquis prior to procedure  Risk Assessment:    Low   _0       Moderate   _1     High   _2           This patient is optimized for surgery  YES _3       NO   _4    I recommend further assessment/workup prior to surgery. YES _5      NO  _6   Appointment scheduled for: _______________________   Further recommendations: ____________________________________     Physician Signature:__________________________________   Printed Name: ________________________________________   Date: _________________

## 2022-05-06 ENCOUNTER — Other Ambulatory Visit: Payer: Medicare (Managed Care)

## 2022-05-06 ENCOUNTER — Telehealth: Payer: Self-pay | Admitting: Family Medicine

## 2022-05-06 ENCOUNTER — Other Ambulatory Visit: Payer: Self-pay | Admitting: Urology

## 2022-05-06 DIAGNOSIS — R972 Elevated prostate specific antigen [PSA]: Secondary | ICD-10-CM | POA: Diagnosis not present

## 2022-05-06 DIAGNOSIS — A599 Trichomoniasis, unspecified: Secondary | ICD-10-CM

## 2022-05-06 LAB — MICROSCOPIC EXAMINATION: Epithelial Cells (non renal): >10 /hpf — AB (ref 0–10)

## 2022-05-06 LAB — URINALYSIS, COMPLETE
Bilirubin, UA: NEGATIVE
Ketones, UA: NEGATIVE
Nitrite, UA: NEGATIVE
Specific Gravity, UA: 1.02 (ref 1.005–1.030)
Urobilinogen, Ur: 1 mg/dL (ref 0.2–1.0)
pH, UA: 7 (ref 5.0–7.5)

## 2022-05-06 MED ORDER — METRONIDAZOLE 500 MG PO TABS: ORAL_TABLET | ORAL | 0 refills | Status: DC

## 2022-05-06 NOTE — Telephone Encounter (Signed)
Patient notified the urine collected today was positive for Trichomonas. I informed him that he will have to tell his partner and more labs have been added for additional testing. Patient voiced understanding. We will call with the lab results. Flagyl 4 pills at 1 time was sent to his pharmacy.

## 2022-05-10 LAB — GC/CHLAMYDIA PROBE AMP
Chlamydia trachomatis, NAA: NEGATIVE
Neisseria Gonorrhoeae by PCR: NEGATIVE

## 2022-05-11 ENCOUNTER — Telehealth: Payer: Self-pay | Admitting: Family Medicine

## 2022-05-11 LAB — CULTURE, URINE COMPREHENSIVE

## 2022-05-11 MED ORDER — DOXYCYCLINE HYCLATE 100 MG PO CAPS: 100.0000 mg | ORAL_CAPSULE | Freq: Two times a day (BID) | ORAL | 0 refills | Status: DC

## 2022-05-11 NOTE — Telephone Encounter (Signed)
Patient notified and voiced understanding. ABX sent to pharmacy. He has an appointment scheduled with Dr. Bernardo Heater I added a note to get a urine sample at this appointment.

## 2022-05-11 NOTE — Telephone Encounter (Signed)
-----   Message from Nori Riis, PA-C sent at 05/11/2022 10:09 AM EST ----- Please let Mr. Boehlke know that his atypical culture has returned positive he needs to start doxycycline 100 mg twice daily for 14 days.  We will then need to repeat the urinalysis and the atypical culture.

## 2022-05-12 MED ORDER — DOXYCYCLINE HYCLATE 100 MG PO CAPS: 100.0000 mg | ORAL_CAPSULE | Freq: Two times a day (BID) | ORAL | 0 refills | Status: DC

## 2022-05-12 NOTE — Addendum Note (Signed)
Addended by: Gerald Leitz A on: 05/12/2022 10:18 AM   Modules accepted: Orders

## 2022-05-12 NOTE — Telephone Encounter (Signed)
Patient called in, Prescription sent to wrong pharmacy. Sent to [facility] per patient request.

## 2022-05-13 ENCOUNTER — Other Ambulatory Visit: Payer: Self-pay | Admitting: Urology

## 2022-05-13 DIAGNOSIS — A599 Trichomoniasis, unspecified: Secondary | ICD-10-CM

## 2022-05-13 LAB — MYCOPLASMA / UREAPLASMA CULTURE
Mycoplasma hominis Culture: NEGATIVE
Ureaplasma urealyticum: POSITIVE — AB

## 2022-05-13 MED ORDER — DOXYCYCLINE HYCLATE 100 MG PO CAPS: 100.0000 mg | ORAL_CAPSULE | Freq: Two times a day (BID) | ORAL | 0 refills | Status: DC

## 2022-05-16 NOTE — Progress Notes (Unsigned)
Cardiology Office Note  Date:  05/17/2022   ID:  Tyler Cohen Feb 03, 1956, MRN [id number]  PCP:  Tyler Roys, DO   Chief Complaint  Patient presents with   New Patient (Initial Visit)    Establish care for aortic atherosclerosis, CHF and cardiomyopathy. Needs a cardiac clearance for a prostate biopsy. Medications reviewed by the patient verbally.     HPI:  Tyler Cohen is a 67 year old gentleman with past medical history of hypertension,  hyperlipidemia,  Long smoking history hypothyroidism on PTU on methimazole,  atrial fibrillation in the setting of thyrotoxicosis,  NICM (EF 20 to 25%)  Who presents by referral from Tyler Cohen for aortic atherosclerosis, cardiomyopathy, CHF, moderate MR  admitted to the hospital on 05/16/2021  shortness of breath , atrial fibrillation/flutter.   TSH level of less than 0.01, and a free thyroxine level of 3.5 consistent with thyrotoxicosis.  Started on propylthiouracil, propranolol, hydrocortisone, cholestyramine, and IV Lasix. treated for a right basilar pneumonia.  Echocardiogram detailing EF of 25% with global hypokinesis   developed cardiogenic shock for which she was placed on milrinone for inotropic support,  transitioned to amiodarone,  dobutamine and digoxin on 2/15. converted to normal sinus rhythm on 05/24/2021.  discharged on amiodarone, digoxin 0.25 mg daily.  EF did not recover.   coronary CTA completed that showed no obstructive coronary disease in May 2023.   He has provided a medication list that does not look accurate with our list Is listed as amiodarone, missing Flomax, missing inhaler Reports he is on antibiotics in preparation for surgery, not on his list Has the antibiotics in his pocket with him today  Echo 06/25/21- SEVERE LV SYSTOLIC DYSFUNCTION (See above) MILD RV SYSTOLIC DYSFUNCTION (See above) NO VALVULAR STENOSIS DILATED LV (LVIDd 6.4 cm), EF ~ 25-30% MODERATE TO SEVERE MR POSSIBLE PFO  NOTED  Cardiac CTA 08/2021-  1. Normal coronary calcium score of 0. Patient is low risk for  coronary events.  2. Normal coronary origin with right dominance.  3. No evidence of CAD.  4. CAD-RADS 0. Consider non-atherosclerotic causes of  cardiomyopathy.  5. Etiology for cardiomyopathy is Non-ischemic.   Protate bx 05/25/22 with Tyler Cohen Denies seeing again shortness of breath, no chest pain, denies tachypalpitations  EKG personally reviewed by myself on todays visit Normal sinus rhythm rate 80 bpm consider LVH, T wave abnormality inferior leads   PMH:   has a past medical history of Aortic atherosclerosis (Tyler Cohen), Arthritis, Atrial fibrillation (Tyler Cohen), CHF (congestive heart failure) (Tyler Cohen), Heart murmur, Hyperlipidemia, Macrocytosis, Thyroid disease, and Tobacco abuse.  PSH:    Past Surgical History:  Procedure Laterality Date   COLONOSCOPY WITH PROPOFOL N/A 12/01/2015   Procedure: COLONOSCOPY WITH PROPOFOL;  Surgeon: Tyler Lame, MD;  Location: [facility];  Service: Endoscopy;  Laterality: N/A;   POLYPECTOMY  12/01/2015   Procedure: POLYPECTOMY;  Surgeon: Tyler Lame, MD;  Location: [facility];  Service: Endoscopy;;    Current Outpatient Medications  Medication Sig Dispense Refill   albuterol (VENTOLIN HFA) 108 (90 Base) MCG/ACT inhaler Inhale 2 puffs into the lungs every 6 (six) hours as needed for wheezing or shortness of breath. 1 each 12   atorvastatin (LIPITOR) 20 MG tablet TAKE 1 TABLET(20 MG) BY MOUTH AT BEDTIME 90 tablet 1   digoxin (LANOXIN) 0.125 MG tablet Take 1 tablet (125 mcg total) by mouth daily. 90 tablet 1   ELIQUIS 5 MG TABS tablet Take 1 tablet (5 mg total) by  mouth 2 (two) times daily. 180 tablet 1   empagliflozin (JARDIANCE) 10 MG TABS tablet Take 1 tablet (10 mg total) by mouth daily. 90 tablet 1   furosemide (LASIX) 40 MG tablet Take 1 tablet (40 mg total) by mouth daily. 90 tablet 1   methimazole (TAPAZOLE) 10 MG tablet Take 1 tablet (10 mg  total) by mouth daily. 90 tablet 1   metroNIDAZOLE (FLAGYL) 500 MG tablet Take four tablets at one time (Patient not taking: Reported on 05/17/2022) 4 tablet 0   spironolactone (ALDACTONE) 25 MG tablet Take 0.5 tablets (12.5 mg total) by mouth daily. 45 tablet 1   tamsulosin (FLOMAX) 0.4 MG CAPS capsule Take 1 capsule (0.4 mg total) by mouth daily. 90 capsule 1   No current facility-administered medications for this visit.    Allergies:   Lactose intolerance (gi)   Social History:  The patient  reports that he quit smoking about a year ago. His smoking use included cigars and cigarettes. He has a 10.50 pack-year smoking history. He has never used smokeless tobacco. He reports current drug use. Drug: Marijuana. He reports that he does not drink alcohol.   Family History:   family history includes Diabetes in his mother; Emphysema in his father; Hyperlipidemia in his brother; Hypertension in his brother, father, and mother; Kidney disease in his mother; Sickle cell trait in his father and sister.    Review of Systems: Review of Systems  Constitutional: Negative.   HENT: Negative.    Respiratory: Negative.    Cardiovascular: Negative.   Gastrointestinal: Negative.   Musculoskeletal: Negative.   Neurological: Negative.   Psychiatric/Behavioral: Negative.    All other systems reviewed and are negative.    PHYSICAL EXAM: VS:  BP 110/60 (BP Location: Right Arm, Patient Position: Sitting, Cuff Size: Normal)   Pulse 80   Ht 6' 1"$  (1.854 m)   Wt 158 lb 2 oz (71.7 kg)   SpO2 97%   BMI 20.86 kg/m  , BMI Body mass index is 20.86 kg/m. GEN: Well nourished, well developed, in no acute distress HEENT: normal Neck: no JVD, carotid bruits, or masses Cardiac: RRR; no murmurs, rubs, or gallops,no edema  Respiratory:  clear to auscultation bilaterally, normal work of breathing GI: soft, nontender, nondistended, + BS MS: no deformity or atrophy Skin: warm and dry, no rash Neuro:  Strength and  sensation are intact Psych: euthymic mood, full affect   Recent Labs: 03/25/2022: Hemoglobin 15.9; Platelets 166 04/22/2022: ALT 23; BUN 15; Creatinine, Ser 1.02; Potassium 4.1; Sodium 138; TSH <0.005    Lipid Panel Lab Results  Component Value Date   CHOL 139 04/22/2022   HDL 47 04/22/2022   LDLCALC 77 04/22/2022   TRIG 77 04/22/2022      Wt Readings from Last 3 Encounters:  05/17/22 158 lb 2 oz (71.7 kg)  05/17/22 155 lb (70.3 kg)  04/23/22 155 lb (70.3 kg)       ASSESSMENT AND PLAN:  Problem List Items Addressed This Visit       Cardiology Problems   Hyperlipidemia   Aortic atherosclerosis ([place])   Atrial fibrillation ([facility])   Congestive heart failure ([facility])     Other   Hyperthyroidism   Tobacco abuse   Multinodular goiter   Other Visit Diagnoses     Nonischemic cardiomyopathy ([place])    -  Primary      Preop biopsy clearance Acceptable risk for surgery, will need to stop Eliquis 2 days prior to procedure, 3  if mandated by surgical team Recommend he restart Eliquis when cleared by surgery to restart  Nonischemic cardiomyopathy Appears euvolemic on today's visit Recommended he continue digoxin 0.125 daily, Jardiance, Lasix 40 daily with spironolactone We will check digoxin level today  Aortic atherosclerosis Continue Lipitor  Atrial fibrillation with RVR Noted February 2023, off amiodarone, continue digoxin and Eliquis Could consider adding beta-blocker and follow-up  Smoker We have encouraged him to continue to work on weaning his cigarettes and smoking cessation. He will continue to work on this and does not want any assistance with chantix.    Chronic systolic CHF Appears euvolemic, continue medications as above    Total encounter time more than 40 minutes  Greater than 50% was spent in counseling and coordination of care with the patient    Signed, Esmond Plants, M.D., Ph.D. [place], [place]

## 2022-05-17 ENCOUNTER — Ambulatory Visit: Payer: Medicare (Managed Care) | Attending: Cardiovascular Disease | Admitting: Cardiovascular Disease

## 2022-05-17 ENCOUNTER — Other Ambulatory Visit: Payer: Self-pay

## 2022-05-17 ENCOUNTER — Encounter: Payer: Self-pay | Admitting: Cardiovascular Disease

## 2022-05-17 ENCOUNTER — Encounter
Admission: RE | Admit: 2022-05-17 | Discharge: 2022-05-17 | Disposition: A | Discharge status: Home / Self Care | Payer: Medicare (Managed Care) | Source: Ambulatory Visit | Attending: Urology | Admitting: Urology

## 2022-05-17 ENCOUNTER — Encounter: Payer: Self-pay | Admitting: Urgent Care

## 2022-05-17 VITALS — Ht 73.0 in | Wt 155.0 lb

## 2022-05-17 VITALS — BP 110/60 | HR 80 | Ht 73.0 in | Wt 158.1 lb

## 2022-05-17 DIAGNOSIS — E7800 Pure hypercholesterolemia, unspecified: Secondary | ICD-10-CM

## 2022-05-17 DIAGNOSIS — I700 Atherosclerosis of aorta: Secondary | ICD-10-CM

## 2022-05-17 DIAGNOSIS — Z720 Tobacco use: Secondary | ICD-10-CM | POA: Diagnosis not present

## 2022-05-17 DIAGNOSIS — I480 Paroxysmal atrial fibrillation: Secondary | ICD-10-CM | POA: Insufficient documentation

## 2022-05-17 DIAGNOSIS — I428 Other cardiomyopathies: Secondary | ICD-10-CM | POA: Diagnosis not present

## 2022-05-17 DIAGNOSIS — E0590 Thyrotoxicosis, unspecified without thyrotoxic crisis or storm: Secondary | ICD-10-CM | POA: Diagnosis not present

## 2022-05-17 DIAGNOSIS — I5022 Chronic systolic (congestive) heart failure: Secondary | ICD-10-CM | POA: Insufficient documentation

## 2022-05-17 DIAGNOSIS — Z01812 Encounter for preprocedural laboratory examination: Secondary | ICD-10-CM | POA: Diagnosis not present

## 2022-05-17 DIAGNOSIS — E042 Nontoxic multinodular goiter: Secondary | ICD-10-CM | POA: Diagnosis not present

## 2022-05-17 HISTORY — DX: Heart failure, unspecified: I50.9

## 2022-05-17 HISTORY — DX: Atherosclerosis of aorta: I70.0

## 2022-05-17 HISTORY — DX: Unspecified atrial fibrillation: I48.91

## 2022-05-17 LAB — CBC
HCT: 47.6 % (ref 39.0–52.0)
Hemoglobin: 16.1 g/dL (ref 13.0–17.0)
MCH: 31.8 pg (ref 26.0–34.0)
MCHC: 33.8 g/dL (ref 30.0–36.0)
MCV: 93.9 fL (ref 80.0–100.0)
Platelets: 196 10*3/uL (ref 150–400)
RBC: 5.07 MIL/uL (ref 4.22–5.81)
RDW: 11.9 % (ref 11.5–15.5)
WBC: 5.8 10*3/uL (ref 4.0–10.5)
nRBC: 0 % (ref 0.0–0.2)

## 2022-05-17 LAB — BASIC METABOLIC PANEL
Anion gap: 11 (ref 5–15)
BUN: 24 mg/dL — ABNORMAL HIGH (ref 8–23)
CO2: 20 mmol/L — ABNORMAL LOW (ref 22–32)
Calcium: 9.3 mg/dL (ref 8.9–10.3)
Chloride: 103 mmol/L (ref 98–111)
Creatinine, Ser: 1.1 mg/dL (ref 0.61–1.24)
GFR, Estimated: >60 mL/min (ref >60)
Glucose, Bld: 105 mg/dL — ABNORMAL HIGH (ref 70–99)
Potassium: 3.8 mmol/L (ref 3.5–5.1)
Sodium: 134 mmol/L — ABNORMAL LOW (ref 135–145)

## 2022-05-17 NOTE — Patient Instructions (Signed)
Your procedure is scheduled on: Tuesday 05/25/22 Report to the Registration Desk on the 1st floor of the [facility].. To find out your arrival time please call [phone] between 1PM - 3PM on Monday 05/24/22.  Remember: Instructions that are not followed completely may result in serious medical risk, up to and including death, or upon the discretion of your surgeon and anesthesiologist your surgery may need to be rescheduled.     _X__ 1. Do not eat food or drink any liquids after midnight the night before your procedure.                 No gum chewing or hard candies.  __X__2.  On the morning of surgery brush your teeth with toothpaste and water, you                 may rinse your mouth with mouthwash if you wish.  Do not swallow any              toothpaste of mouthwash. You must bathe prior to arrival.     _X__ 3.  No Alcohol for 24 hours before or after surgery.   _X__ 4.  Do Not Smoke or use e-cigarettes For 24 Hours Prior to Your Surgery.                 Do not use any chewable tobacco products for at least 6 hours prior to                 surgery.  ____  5.  Bring all medications with you on the day of surgery if instructed.   __X__  6.  Notify your doctor if there is any change in your medical condition      (cold, fever, infections).     Do not wear jewelry, make-up, hairpins, clips or nail polish. Do not wear lotions, powders, or perfumes.  Do not shave body hair 48 hours prior to surgery. Men may shave face and neck. Do not bring valuables to the hospital.    [facility] is not responsible for any belongings or valuables.  Contacts, dentures/partials or body piercings may not be worn into surgery. Bring a case for your contacts, glasses or hearing aids, a denture cup will be supplied. Leave your suitcase or overnight bag in the car. After surgery it may be brought to your room. For patients admitted to the hospital, discharge time is determined by your treatment team. In  case of increased patient census, it may be necessary for you, the patient, to continue your postoperative care within the Same Day Surgery department.   Patients discharged the day of surgery will need a responsible, legally licensed driver to drive them home. A learners permit is NOT acceptable. The driver must be free of impairment including anesthesia medications You will not be allowed to drive yourself home. If you are going home via [place], [place] or other public transportation (non medical) you MUST be accompanied be a responsible individual.   Continue taking all prescribed medications with the exception of the following:  Hold Eliquis 2 days for surgery, last dose Saturday 05/22/22 Hold Jardiance 3 days for surgery, last dose Friday 05/21/22  One week prior to surgery: Stop Anti-inflammatories (NSAIDS) such as Advil, Aleve, Ibuprofen, Motrin, Naproxen, Naprosyn and Aspirin based products such as Excedrin, Goody's Powder, BC Powder. You may however, continue to take Tylenol if needed for pain up until the day of surgery  Stop ANY  OVER THE COUNTER supplements until after surgery.   __X__ Take these medicines the morning of surgery with A SIP OF WATER:    1. digoxin (LANOXIN) 0.125 MG tablet   2. doxycycline (VIBRAMYCIN) 100 MG capsule   3. methimazole (TAPAZOLE) 10 MG tablet   4. tamsulosin (FLOMAX) 0.4 MG CAPS capsule   __X__ Fleet Enema (as directed)   __X__ Use inhalers on the day of surgery

## 2022-05-17 NOTE — Patient Instructions (Addendum)
Medication Instructions:  Stop eliquis 2 days before surgery on prostate No eliquis after 2/17 Restart eliquis when Dr. Bernardo Heater says ok to restart  Stop amiodarone  If you need a refill on your cardiac medications before your next appointment, please call your pharmacy.   Lab work: Digoxin lab  Testing/Procedures: No new testing needed  Follow-Up: At [facility], you and your health needs are our priority.  As part of our continuing mission to provide you with exceptional heart care, we have created designated Provider Care Teams.  These Care Teams include your primary Cardiologist (physician) and Advanced Practice Providers (APPs -  Physician Assistants and Nurse Practitioners) who all work together to provide you with the care you need, when you need it.  You will need a follow up appointment in 3 months  Providers on your designated Care Team:   Murray Hodgkins, NP Christell Faith, PA-C Cadence Kathlen Mody, [place]  COVID-19 Vaccine Information can be found at: [email or web address] For questions related to vaccine distribution or appointments, please email [email or web address] or call [phone].

## 2022-05-21 ENCOUNTER — Encounter: Payer: Self-pay | Admitting: Urology

## 2022-05-21 NOTE — Progress Notes (Signed)
Perioperative / Anesthesia Services  Pre-Admission Testing Clinical Review / Preoperative Anesthesia Consult  Date: 05/24/22  Patient Demographics:  Name: Tyler Cohen DOB:   1955-12-09 MRN:   [id number]  Planned Surgical Procedure(s):    Case: [id number] Date/Time: 05/25/22 0745   Procedures:      PROSTATE BIOPSY     TRANSRECTAL ULTRASOUND   Anesthesia type: Monitor Anesthesia Care   Pre-op diagnosis: Elevated Prostate Specific Antigen   Location: [facility] OR ROOM 10 / [place] [facility]   Surgeons: Abbie Sons, MD     NOTE: Available PAT nursing documentation and vital signs have been reviewed. Clinical nursing staff has updated patient's PMH/PSHx, current medication list, and drug allergies/intolerances to ensure comprehensive history available to assist in medical decision making as it pertains to the aforementioned surgical procedure and anticipated anesthetic course. Extensive review of available clinical information personally performed. [facility] PMH and PSHx updated with any diagnoses/procedures that  may have been inadvertently omitted during his intake with the pre-admission testing department's nursing staff.  Clinical Discussion:  Tyler Cohen is a 67 y.o. male who is submitted for pre-surgical anesthesia review and clearance prior to him undergoing the above procedure. Patient is a Former Smoker (10.5 pack years; quit 05/2021). Pertinent PMH includes: atrial fibrillation, NICM, CHF, aortic atherosclerosis, cardiac murmur, HTN, HLD, hypothyroidism, RLL pulmonary nodule, BPH, elevated PSA, OA.  Patient is followed by cardiology Rockey Situ, MD). He was last seen in the cardiology clinic on 05/17/2022; notes reviewed. At the time of his clinic visit, patient doing well overall from a cardiovascular perspective. Patient denied any chest pain, shortness of breath, PND, orthopnea, palpitations, significant peripheral edema, weakness, fatigue, vertiginous symptoms, or  presyncope/syncope. Patient with a past medical history significant for cardiovascular diagnoses. Documented physical exam was grossly benign, providing no evidence of acute exacerbation and/or decompensation of the patient's known cardiovascular conditions.  Patient admitted to the hospital on 05/14/2021 for atrial fibrillation and shortness of breath.  Thyroid dysfunction noted with hormone levels consistent with thyrotoxicosis.  Patient was started on PTU, propranolol, hydrocortisone, Colesytramine, and IV Lasix at that time.  He was also treated for a RIGHT lower lobe pneumonia.  Hospitalization was complicated by the development of cardiogenic shock requiring milrinone for inotropic support.  Patient was transition to amiodarone, dobutamine, and digoxin.  Interventions converted patient to normal sinus rhythm, however despite treatment, EF did not recover.  He was discharged home on oral amiodarone and digoxin.   TTE performed on 05/14/2021 revealed a severely reduced left ventricular systolic function with an EF of 25-30%.  There was global hypokinesis.  Left ventricular cavity size moderately dilated.  Right ventricular size moderately enlarged with a moderately reduced function.  PASP elevated at 39.8 mmHg.  Aortic root borderline dilated at 38 mm.  There was moderate mitral and mild tricuspid valve regurgitation.  There was no evidence of any significant transvalvular gradient suggestive of valvular stenosis.  Repeat TTE was performed on 06/25/2021 revealing a severely reduced EF of 30%.  Left ventricle moderately enlarged.  There was moderate global hypokinesis. Left ventricular diastolic Doppler parameters consistent with abnormal relaxation (G1DD).  Left atrium enlarged.  There was trivial aortic/pulmonary, mild tricuspid, and moderate mitral valve regurgitation.  All transvalvular gradients noted to be normal; no valvular stenosis.  Questionable PFO observed.  Patient underwent cardiac CTA on  08/20/2021 revealing a coronary calcium score of 0.  Etiology of known cardiomyopathy nonischemic in nature.  Patient with an atrial fibrillation  diagnosis; CHA2DS2-VASc Score = 4 (age, CHF, HTN, vascular disease history).patient underwent DCCV procedure on 05/18/2021, at which time he received a single 300 J synchronized cardioversion, which converted him to NSR.  His rate and rhythm are currently being maintained on oral digoxin monotherapy. He is chronically anticoagulated using standard dose apixaban; reported to be compliant with therapy with no evidence or reports of GI bleeding.  Blood pressure well controlled at 110/60 mmHg on currently prescribed diuretic (spironolactone, furosemide) and therapies being used to control his atrial fibrillation (digoxin).  For added protection in the setting of known cardiovascular disease, patient is on an SGLT2i (empagliflozin) for management of his NICM and resulting CHF.  He is on a atorvastatin for his HLD diagnosis and further ASCVD prevention.  Patient is not diabetic. Patient does not have an OSAH diagnosis. Functional capacity, as defined by DASI, is documented as being >/= 4 METS. No changes were made to his medication regimen during his visit with cardiology.  Patient scheduled to follow-up with outpatient cardiology in 3 months or sooner if needed.  Tyler Cohen is scheduled for a TRANSRECTAL ULTRASOUND GUIDED PROSTATE BIOPSY on 05/25/2022 with Dr. John Giovanni, MD.  Given patient's past medical history significant for cardiovascular diagnoses, presurgical cardiac clearance was sought by the PAT team. Per cardiology, "based ACC/AHA guidelines, the patient's past medical history, and the amount of time since his last clinic visit, this patient would be at an overall ACCEPTABLE risk for the planned procedure without further cardiovascular testing or intervention at this time". .    Again, this patient is on daily oral anticoagulation therapy.  He has been  instructed on recommendations for holding his apixaban dose for 2 days prior to his procedure with plans to restart since postoperatively respectively minimized by his primary attending surgeon.  Patient is aware that his last dose of apixaban should be on 05/22/2022.  Patient denies previous perioperative complications with anesthesia in the past. In review of the available records, it is noted that patient underwent a general anesthetic course at [facility] ([facility]) in 05/2021 without documented complications.      05/17/2022    9:59 AM 05/17/2022    9:54 AM 05/17/2022    8:36 AM  Vitals with BMI  Height 6' 1"$  6' 1"$  6' 1"$   Weight 158 lbs 2 oz 158 lbs 2 oz 155 lbs  BMI 20.87 [id number]  Systolic [id number]   Diastolic 60 60   Pulse 80 80     Providers/Specialists:   NOTE: Primary physician provider listed below. Patient may have been seen by APP or partner within same practice.   PROVIDER ROLE / SPECIALTY LAST Claud Kelp, MD Urology (Surgeon) 04/23/2022  Valerie Roys, DO Primary Care Provider 03/25/2022  Ida Rogue, MD Cardiology 05/17/2022   Allergies:  Lactose intolerance (gi)  Current Home Medications:   No current facility-administered medications for this encounter.    albuterol (VENTOLIN HFA) 108 (90 Base) MCG/ACT inhaler   atorvastatin (LIPITOR) 20 MG tablet   digoxin (LANOXIN) 0.125 MG tablet   ELIQUIS 5 MG TABS tablet   empagliflozin (JARDIANCE) 10 MG TABS tablet   furosemide (LASIX) 40 MG tablet   methimazole (TAPAZOLE) 10 MG tablet   spironolactone (ALDACTONE) 25 MG tablet   tamsulosin (FLOMAX) 0.4 MG CAPS capsule   History:   Past Medical History:  Diagnosis Date   Aortic atherosclerosis (HCC)    Arthritis    Atrial fibrillation ([place])  a.) CHA2DS2VASc = 4 (age, CHF, HTN, vascular disease history);  b.) s/p DCCV (300 J x 1) 05/18/2021; c.) rate/rhythm maintained on oral digoxin; chronically anticoagulated with apixaban    BPH (benign prostatic hyperplasia)    CHF (congestive heart failure) ([place])    a.) TTE 05/14/2021: EF 25-30%, glob HK, mod dil LV, RVE, PASP 39.8; b.) TTE 06/25/2021: EF 30%, LAE, triv AR/PR, mild TR, mod MR, G1DD   Elevated PSA    Heart murmur    Hepatic cyst    Hepatic hemangioma    HLD (hyperlipidemia)    HTN (hypertension)    Hypothyroidism    a.) thyrotoxicosis in 05/2021 (TSH < 0.010)   Long term current use of antiarrhythmic drug    a.) digoxin   Long term current use of anticoagulant    a.) apixaban   Macrocytosis    Multinodular goiter    NICM (nonischemic cardiomyopathy) ([place])    a.) TTE 05/14/2021: EF 25-30%; b.) TTE 06/25/2021: EF 30%   Right lower lobe pulmonary nodule 08/20/2021   a.) cCTA 08/20/2021: measured 7 x 5 mm.   Tobacco abuse    Past Surgical History:  Procedure Laterality Date   COLONOSCOPY WITH PROPOFOL N/A 12/01/2015   Procedure: COLONOSCOPY WITH PROPOFOL;  Surgeon: Lucilla Lame, MD;  Location: [place];  Service: Endoscopy;  Laterality: N/A;   POLYPECTOMY  12/01/2015   Procedure: POLYPECTOMY;  Surgeon: Lucilla Lame, MD;  Location: [place];  Service: Endoscopy;;   TEE WITH CARDIOVERSION N/A 05/18/2021   Family History  Problem Relation Age of Onset   Hypertension Mother    Diabetes Mother        lost both legs   Kidney disease Mother    Hypertension Father    Emphysema Father    Sickle cell trait Father    Hypertension Brother    Hyperlipidemia Brother    Sickle cell trait Sister    Social History   Tobacco Use   Smoking status: Former    Packs/day: 0.25    Years: 42.00    Total pack years: 10.50    Types: Cigars, Cigarettes    Quit date: 05/17/2021    Years since quitting: 1.0   Smokeless tobacco: Never  Vaping Use   Vaping Use: Never used  Substance Use Topics   Alcohol use: No   Drug use: Yes    Types: Marijuana    Comment: pt states he smokes every once in a while    Pertinent Clinical Results:   LABS:   No visits with results within 3 Day(s) from this visit.  Latest known visit with results is:  Hospital Outpatient Visit on 05/17/2022  Component Date Value Ref Range Status   Sodium 05/17/2022 134 (L)  135 - 145 mmol/L Final   Potassium 05/17/2022 3.8  3.5 - 5.1 mmol/L Final   Chloride 05/17/2022 103  98 - 111 mmol/L Final   CO2 05/17/2022 20 (L)  22 - 32 mmol/L Final   Glucose, Bld 05/17/2022 105 (H)  70 - 99 mg/dL Final   Glucose reference range applies only to samples taken after fasting for at least 8 hours.   BUN 05/17/2022 24 (H)  8 - 23 mg/dL Final   Creatinine, Ser 05/17/2022 1.10  0.61 - 1.24 mg/dL Final   Calcium 05/17/2022 9.3  8.9 - 10.3 mg/dL Final   GFR, Estimated 05/17/2022 >60  >60 mL/min Final   Comment: (NOTE) Calculated using the CKD-EPI Creatinine Equation (2021)  Anion gap 05/17/2022 11  5 - 15 Final   Performed at [facility], [address] [place], [place] [address]   WBC 05/17/2022 5.8  4.0 - 10.5 K/uL Final   RBC 05/17/2022 5.07  4.22 - 5.81 MIL/uL Final   Hemoglobin 05/17/2022 16.1  13.0 - 17.0 g/dL Final   HCT 05/17/2022 47.6  39.0 - 52.0 % Final   MCV 05/17/2022 93.9  80.0 - 100.0 fL Final   MCH 05/17/2022 31.8  26.0 - 34.0 pg Final   MCHC 05/17/2022 33.8  30.0 - 36.0 g/dL Final   RDW 05/17/2022 11.9  11.5 - 15.5 % Final   Platelets 05/17/2022 196  150 - 400 K/uL Final   nRBC 05/17/2022 0.0  0.0 - 0.2 % Final   Performed at [facility], [address] [place], [place] [address]    ECG: Date: 05/17/2022 Time ECG obtained: 1004 AM Rate: 80 bpm Rhythm:  Sinus rhythm with PACs Axis (leads I and aVF): Normal Intervals: PR 146 ms. QRS 86 ms. QTc 429 ms. ST segment and T wave changes: Nonspecific inferior T wave abnormalities.  Voltage criteria for LVH noted. Comparison: Previous tracing obtained on 05/13/2021 showed atrial fibrillation with 2-1 conduction at a rate of 151 bpm (setting of thyrotoxicosis).   IMAGING  / PROCEDURES: CT CORONARY MORPH W/CTA COR W/SCORE W/CA W/CM &/OR WO/CM performed on 08/20/2021 Normal coronary calcium score of 0. Patient is low risk for coronary events. Normal coronary origin with right dominance. No evidence of CAD. CAD-RADS 0. Consider non-atherosclerotic causes of cardiomyopathy. Etiology for cardiomyopathy is Non-ischemic. No acute findings in the chest. 7 x 5 mm RIGHT lower lobe pulmonary nodule. 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 Consider three-month follow-up as this could also represent sequela of prior infection or inflammation based on comparison with the imaging that was obtained in February of 2023. Multiple areas of ground-glass and septal thickening that were present on the comparison exam have largely resolved. Hepatic hemangiomas and cysts better characterized on previous MR imaging.  TRANSTHORACIC ECHOCARDIOGRAM performed on 06/25/2021 Severely reduced left ventricular systolic function with an EF of 30% Global hypokinesis Left ventricular diastolic Doppler parameters consistent with abnormal relaxation (G1DD). Left atrium mildly enlarged Trivial AR and PR Mild TR Moderate AR PASP = 33.4 mmHg  Impression and Plan:  Tyler Cohen has been referred for pre-anesthesia review and clearance prior to him undergoing the planned anesthetic and procedural courses. Available labs, pertinent testing, and imaging results were personally reviewed by me in preparation for upcoming operative/procedural course. [facility] medical record has been updated following extensive record review and patient interview with PAT staff.   This patient has been appropriately cleared by cardiology with an overall ACCEPTABLE risk of significant perioperative cardiovascular complications. Based on clinical review performed today (05/24/22), barring any significant acute changes in the patient's overall  condition, it is anticipated that he will be able to proceed with the planned surgical intervention. Any acute changes in clinical condition may necessitate his procedure being postponed and/or cancelled. Patient will meet with anesthesia team (MD and/or CRNA) on the day of his procedure for preoperative evaluation/assessment. Questions regarding anesthetic course will be fielded at that time.   Pre-surgical instructions were reviewed with the patient during his PAT appointment, and questions were fielded to satisfaction by PAT clinical staff. He has been instructed on which medications that he will need to hold prior  to surgery, as well as the ones that have been deemed safe/appropriate to take of the day of his procedure. As part of the general education provided by PAT, patient made aware both verbally and in writing, that he would need to abstain from the use of any illegal substances during his perioperative course.  He was advised that failure to follow the provided instructions could necessitate case cancellation or result serious perioperative complications up to and including death. Patient encouraged to contact PAT and/or his surgeon's office to discuss any questions or concerns that may arise prior to surgery; verbalized understanding.   Honor Loh, MSN, APRN, FNP-C, CEN [facility]  Peri-operative Services Nurse Practitioner Phone: [phone] Fax: [phone] 05/24/22 9:35 AM  NOTE: This note has been prepared using Dragon dictation software. Despite my best ability to proofread, there is always the potential that unintentional transcriptional errors may still occur from this process.

## 2022-05-24 ENCOUNTER — Encounter: Payer: Self-pay | Admitting: Urology

## 2022-05-24 NOTE — Anesthesia Preprocedure Evaluation (Signed)
Anesthesia Evaluation  Patient identified by MRN, date of birth, ID band Patient awake    Reviewed: Allergy & Precautions, H&P , NPO status , Patient's Chart, lab work & pertinent test results  Airway Mallampati: II  TM Distance: >3 FB Neck ROM: full    Dental no notable dental hx.    Pulmonary former smoker   Pulmonary exam normal        Cardiovascular hypertension, +CHF (NICM)  + dysrhythmias (rate/rhythm maintained on oral digoxin; chronically anticoagulated with apixaban) Atrial Fibrillation + Valvular Problems/Murmurs   TTE 06/25/2021: EF 30%, LAE, triv AR/PR, mild TR, mod MR, G1DD   Neuro/Psych negative neurological ROS  negative psych ROS   GI/Hepatic negative GI ROS, Neg liver ROS,,,  Endo/Other   Hyperthyroidism   Renal/GU negative Renal ROS  negative genitourinary   Musculoskeletal   Abdominal   Peds  Hematology negative hematology ROS (+)   Anesthesia Other Findings Past Medical History: No date: Aortic atherosclerosis (HCC) No date: Arthritis No date: Atrial fibrillation ([place])     Comment:  a.) CHA2DS2VASc = 4 (age, CHF, HTN, vascular disease               history);  b.) s/p DCCV (300 J x 1) 05/18/2021; c.)               rate/rhythm maintained on oral digoxin; chronically               anticoagulated with apixaban No date: BPH (benign prostatic hyperplasia) No date: CHF (congestive heart failure) ([place])     Comment:  a.) TTE 05/14/2021: EF 25-30%, glob HK, mod dil LV, RVE,              PASP 39.8; b.) TTE 06/25/2021: EF 30%, LAE, triv AR/PR,               mild TR, mod MR, G1DD No date: Elevated PSA No date: Heart murmur No date: Hepatic cyst No date: Hepatic hemangioma No date: HLD (hyperlipidemia) No date: HTN (hypertension) No date: Hypothyroidism     Comment:  a.) thyrotoxicosis in 05/2021 (TSH < 0.010) No date: Long term current use of antiarrhythmic drug     Comment:  a.) digoxin No date:  Long term current use of anticoagulant     Comment:  a.) apixaban No date: Macrocytosis No date: Multinodular goiter No date: NICM (nonischemic cardiomyopathy) ([place])     Comment:  a.) TTE 05/14/2021: EF 25-30%; b.) TTE 06/25/2021: EF               30% 08/20/2021: Right lower lobe pulmonary nodule     Comment:  a.) cCTA 08/20/2021: measured 7 x 5 mm. No date: Tobacco abuse  Past Surgical History: 12/01/2015: COLONOSCOPY WITH PROPOFOL; N/A     Comment:  Procedure: COLONOSCOPY WITH PROPOFOL;  Surgeon: Lucilla Lame, MD;  Location: [place];  Service:               Endoscopy;  Laterality: N/A; 12/01/2015: POLYPECTOMY     Comment:  Procedure: POLYPECTOMY;  Surgeon: Lucilla Lame, MD;                Location: [place];  Service: Endoscopy;; 05/18/2021: TEE WITH CARDIOVERSION; N/A     Reproductive/Obstetrics negative OB ROS  Anesthesia Physical Anesthesia Plan  ASA: 3  Anesthesia Plan: General   Post-op Pain Management:    Induction: Intravenous  PONV Risk Score and Plan: Propofol infusion and TIVA  Airway Management Planned: Natural Airway  Additional Equipment:   Intra-op Plan:   Post-operative Plan:   Informed Consent:      Dental Advisory Given  Plan Discussed with: CRNA and Surgeon  Anesthesia Plan Comments:         Anesthesia Quick Evaluation

## 2022-05-25 ENCOUNTER — Encounter
Admission: RE | Disposition: A | Discharge status: Home / Self Care | Payer: Self-pay | Source: Ambulatory Visit | Attending: Urology

## 2022-05-25 ENCOUNTER — Encounter: Payer: Self-pay | Admitting: Urology

## 2022-05-25 ENCOUNTER — Ambulatory Visit
Admission: RE | Admit: 2022-05-25 | Discharge: 2022-05-25 | Disposition: A | Discharge status: Home / Self Care | Payer: Medicare (Managed Care) | Source: Ambulatory Visit | Attending: Urology | Admitting: Urology

## 2022-05-25 ENCOUNTER — Ambulatory Visit: Payer: Medicare (Managed Care) | Admitting: Urgent Care

## 2022-05-25 DIAGNOSIS — R972 Elevated prostate specific antigen [PSA]: Secondary | ICD-10-CM

## 2022-05-25 DIAGNOSIS — C61 Malignant neoplasm of prostate: Secondary | ICD-10-CM | POA: Diagnosis not present

## 2022-05-25 DIAGNOSIS — E785 Hyperlipidemia, unspecified: Secondary | ICD-10-CM | POA: Diagnosis not present

## 2022-05-25 DIAGNOSIS — I509 Heart failure, unspecified: Secondary | ICD-10-CM | POA: Diagnosis not present

## 2022-05-25 DIAGNOSIS — N400 Enlarged prostate without lower urinary tract symptoms: Secondary | ICD-10-CM | POA: Insufficient documentation

## 2022-05-25 DIAGNOSIS — I700 Atherosclerosis of aorta: Secondary | ICD-10-CM | POA: Diagnosis not present

## 2022-05-25 DIAGNOSIS — I4891 Unspecified atrial fibrillation: Secondary | ICD-10-CM | POA: Diagnosis not present

## 2022-05-25 DIAGNOSIS — Z87891 Personal history of nicotine dependence: Secondary | ICD-10-CM | POA: Diagnosis not present

## 2022-05-25 DIAGNOSIS — I428 Other cardiomyopathies: Secondary | ICD-10-CM | POA: Insufficient documentation

## 2022-05-25 DIAGNOSIS — Z7901 Long term (current) use of anticoagulants: Secondary | ICD-10-CM | POA: Diagnosis not present

## 2022-05-25 DIAGNOSIS — E0590 Thyrotoxicosis, unspecified without thyrotoxic crisis or storm: Secondary | ICD-10-CM | POA: Insufficient documentation

## 2022-05-25 DIAGNOSIS — Z79899 Other long term (current) drug therapy: Secondary | ICD-10-CM | POA: Insufficient documentation

## 2022-05-25 DIAGNOSIS — I110 Hypertensive heart disease with heart failure: Secondary | ICD-10-CM | POA: Diagnosis not present

## 2022-05-25 HISTORY — DX: Elevated prostate specific antigen (PSA): R97.20

## 2022-05-25 HISTORY — PX: TRANSRECTAL ULTRASOUND: SHX5146

## 2022-05-25 HISTORY — DX: Nontoxic multinodular goiter: E04.2

## 2022-05-25 HISTORY — DX: Hyperlipidemia, unspecified: E78.5

## 2022-05-25 HISTORY — DX: Other long term (current) drug therapy: Z79.899

## 2022-05-25 HISTORY — DX: Other specified diseases of liver: K76.89

## 2022-05-25 HISTORY — DX: Hemangioma of intra-abdominal structures: D18.03

## 2022-05-25 HISTORY — DX: Essential (primary) hypertension: I10

## 2022-05-25 HISTORY — PX: PROSTATE BIOPSY: SHX241

## 2022-05-25 HISTORY — DX: Other cardiomyopathies: I42.8

## 2022-05-25 HISTORY — DX: Long term (current) use of anticoagulants: Z79.01

## 2022-05-25 HISTORY — DX: Benign prostatic hyperplasia without lower urinary tract symptoms: N40.0

## 2022-05-25 HISTORY — DX: Hypothyroidism, unspecified: E03.9

## 2022-05-25 SURGERY — BIOPSY, PROSTATE
Anesthesia: General | Site: Prostate

## 2022-05-25 MED ORDER — FAMOTIDINE 20 MG PO TABS
ORAL_TABLET | ORAL | Status: AC
  Administered 2022-05-25: 20 mg via ORAL
  Filled 2022-05-25: qty 1

## 2022-05-25 MED ORDER — CHLORHEXIDINE GLUCONATE 0.12 % MT SOLN: 15.0000 mL | Freq: Once | OROMUCOSAL | Status: AC

## 2022-05-25 MED ORDER — ORAL CARE MOUTH RINSE: 15.0000 mL | Freq: Once | OROMUCOSAL | Status: AC

## 2022-05-25 MED ORDER — CHLORHEXIDINE GLUCONATE 0.12 % MT SOLN
OROMUCOSAL | Status: AC
  Administered 2022-05-25: 15 mL via OROMUCOSAL
  Filled 2022-05-25: qty 15

## 2022-05-25 MED ORDER — SODIUM CHLORIDE 0.9 % IV SOLN: INTRAVENOUS | Status: DC

## 2022-05-25 MED ORDER — DEXMEDETOMIDINE HCL IN NACL 80 MCG/20ML IV SOLN
INTRAVENOUS | Status: AC
  Filled 2022-05-25: qty 20

## 2022-05-25 MED ORDER — MIDAZOLAM HCL 2 MG/2ML IJ SOLN
INTRAMUSCULAR | Status: AC
  Filled 2022-05-25: qty 2

## 2022-05-25 MED ORDER — DROPERIDOL 2.5 MG/ML IJ SOLN: 0.6250 mg | Freq: Once | INTRAMUSCULAR | Status: DC | PRN

## 2022-05-25 MED ORDER — PROPOFOL 500 MG/50ML IV EMUL
INTRAVENOUS | Status: DC | PRN
  Administered 2022-05-25: 130 ug/kg/min via INTRAVENOUS

## 2022-05-25 MED ORDER — MIDAZOLAM HCL 2 MG/2ML IJ SOLN
INTRAMUSCULAR | Status: DC | PRN
  Administered 2022-05-25: 2 mg via INTRAVENOUS

## 2022-05-25 MED ORDER — DEXMEDETOMIDINE HCL IN NACL 200 MCG/50ML IV SOLN
INTRAVENOUS | Status: DC | PRN
  Administered 2022-05-25: 8 ug via INTRAVENOUS

## 2022-05-25 MED ORDER — FAMOTIDINE 20 MG PO TABS: 20.0000 mg | ORAL_TABLET | Freq: Once | ORAL | Status: AC

## 2022-05-25 MED ORDER — ACETAMINOPHEN 10 MG/ML IV SOLN: 1000.0000 mg | Freq: Once | INTRAVENOUS | Status: DC | PRN

## 2022-05-25 MED ORDER — LIDOCAINE HCL (PF) 2 % IJ SOLN
INTRAMUSCULAR | Status: AC
  Filled 2022-05-25: qty 5

## 2022-05-25 MED ORDER — LIDOCAINE HCL (PF) 2 % IJ SOLN
INTRAMUSCULAR | Status: DC | PRN
  Administered 2022-05-25: 50 mg via INTRADERMAL

## 2022-05-25 MED ORDER — PROPOFOL 1000 MG/100ML IV EMUL
INTRAVENOUS | Status: AC
  Filled 2022-05-25: qty 100

## 2022-05-25 MED ORDER — PROMETHAZINE HCL 25 MG/ML IJ SOLN: 6.2500 mg | INTRAMUSCULAR | Status: DC | PRN

## 2022-05-25 MED ORDER — SODIUM CHLORIDE 0.9 % IV SOLN
1.0000 g | INTRAVENOUS | Status: AC
  Administered 2022-05-25: 1 g via INTRAVENOUS
  Filled 2022-05-25: qty 1

## 2022-05-25 SURGICAL SUPPLY — 7 items
COVER MAYO STAND REUSABLE (DRAPES) ×2 IMPLANT
GAUZE SPONGE 4X4 12PLY STRL (GAUZE/BANDAGES/DRESSINGS) ×2 IMPLANT
GLOVE SURG UNDER POLY LF SZ7.5 (GLOVE) ×2 IMPLANT
INST BIOPSY MAXCORE 18GX25 (NEEDLE) ×2 IMPLANT
KIT TURNOVER CYSTO (KITS) ×2 IMPLANT
SURGILUBE 2OZ TUBE FLIPTOP (MISCELLANEOUS) ×2 IMPLANT
WATER STERILE IRR 500ML POUR (IV SOLUTION) ×2 IMPLANT

## 2022-05-25 NOTE — Interval H&P Note (Signed)
History and Physical Interval Note:  05/25/2022 7:31 AM  Tyler Cohen  has presented today for surgery, with the diagnosis of Elevated Prostate Specific Antigen.  The various methods of treatment have been discussed with the patient and family. After consideration of risks, benefits and other options for treatment, the patient has consented to  Procedure(s): PROSTATE BIOPSY (N/A) TRANSRECTAL ULTRASOUND (N/A) as a surgical intervention.  The patient's history has been reviewed, patient examined, no change in status, stable for surgery.  I have reviewed the patient's chart and labs.  Questions were answered to the patient's satisfaction.     [place]

## 2022-05-25 NOTE — H&P (Signed)
Urology H&P  History of Present Illness: Tyler Cohen is a 67 y.o. with a elevated PSA of 9.6 approximately 1 year ago.  Prostate biopsy was recommended scheduled however was canceled twice due to hospitalization for other medical problems.  Repeat PSA in December 2023 increased to 16.3 and on 04/22/2022 was 20.8.  He has been treated for trichomonas and Ureaplasma.  Preop urine culture showed no significant growth  Past Medical History:  Diagnosis Date   Aortic atherosclerosis (HCC)    Arthritis    Atrial fibrillation (HCC)    a.) CHA2DS2VASc = 4 (age, CHF, HTN, vascular disease history);  b.) s/p DCCV (300 J x 1) 05/18/2021; c.) rate/rhythm maintained on oral digoxin; chronically anticoagulated with apixaban   BPH (benign prostatic hyperplasia)    CHF (congestive heart failure) ([place])    a.) TTE 05/14/2021: EF 25-30%, glob HK, mod dil LV, RVE, PASP 39.8; b.) TTE 06/25/2021: EF 30%, LAE, triv AR/PR, mild TR, mod MR, G1DD   Elevated PSA    Heart murmur    Hepatic cyst    Hepatic hemangioma    HLD (hyperlipidemia)    HTN (hypertension)    Hypothyroidism    a.) thyrotoxicosis in 05/2021 (TSH < 0.010)   Long term current use of antiarrhythmic drug    a.) digoxin   Long term current use of anticoagulant    a.) apixaban   Macrocytosis    Multinodular goiter    NICM (nonischemic cardiomyopathy) ([place])    a.) TTE 05/14/2021: EF 25-30%; b.) TTE 06/25/2021: EF 30%   Right lower lobe pulmonary nodule 08/20/2021   a.) cCTA 08/20/2021: measured 7 x 5 mm.   Tobacco abuse     Past Surgical History:  Procedure Laterality Date   COLONOSCOPY WITH PROPOFOL N/A 12/01/2015   Procedure: COLONOSCOPY WITH PROPOFOL;  Surgeon: Lucilla Lame, MD;  Location: [facility] [place];  Service: Endoscopy;  Laterality: N/A;   POLYPECTOMY  12/01/2015   Procedure: POLYPECTOMY;  Surgeon: Lucilla Lame, MD;  Location: [place];  Service: Endoscopy;;   TEE WITH CARDIOVERSION N/A 05/18/2021    Home  Medications:  Current Meds  Medication Sig   albuterol (VENTOLIN HFA) 108 (90 Base) MCG/ACT inhaler Inhale 2 puffs into the lungs every 6 (six) hours as needed for wheezing or shortness of breath.   atorvastatin (LIPITOR) 20 MG tablet TAKE 1 TABLET(20 MG) BY MOUTH AT BEDTIME   digoxin (LANOXIN) 0.125 MG tablet Take 1 tablet (125 mcg total) by mouth daily.    Allergies:  Allergies  Allergen Reactions   Lactose Intolerance (Gi)     Family History  Problem Relation Age of Onset   Hypertension Mother    Diabetes Mother        lost both legs   Kidney disease Mother    Hypertension Father    Emphysema Father    Sickle cell trait Father    Hypertension Brother    Hyperlipidemia Brother    Sickle cell trait Sister     Social History:  reports that he quit smoking about a year ago. His smoking use included cigars and cigarettes. He has a 10.50 pack-year smoking history. He has never used smokeless tobacco. He reports current drug use. Drug: Marijuana. He reports that he does not drink alcohol.  ROS: A complete review of systems was performed.  All systems are negative except for pertinent findings as noted.  Physical Exam:  Vital signs in last 24 hours: Temp:  [97.5 F (36.4  C)] 97.5 F (36.4 C) (02/20 0702) Pulse Rate:  [79] 79 (02/20 0702) Resp:  [18] 18 (02/20 0702) BP: (113)/(99) 113/99 (02/20 0702) SpO2:  [98 %] 98 % (02/20 0702) Weight:  [70.3 kg] 70.3 kg (02/20 0702) Constitutional:  Alert and oriented, No acute distress HEENT: [place] AT, moist mucus membranes.  Trachea midline, no masses Cardiovascular: Regular rate and rhythm Respiratory: Normal respiratory effort, lungs clear bilaterally Psychiatric: Normal mood and affect   Laboratory Data:  No results for input(s): "WBC", "HGB", "HCT" in the last 72 hours. No results for input(s): "NA", "K", "CL", "CO2", "GLUCOSE", "BUN", "CREATININE", "CALCIUM" in the last 72 hours. No results for input(s): "LABPT", "INR" in the  last 72 hours. No results for input(s): "LABURIN" in the last 72 hours. Results for orders placed or performed in visit on 05/06/22  CULTURE, URINE COMPREHENSIVE     Status: None   Collection Time: 05/06/22  9:24 AM   Specimen: Urine   UR  Result Value Ref Range Status   Urine Culture, Comprehensive Final report  Final   Organism ID, Bacteria Comment  Final    Comment: Mixed urogenital flora 25,000-50,000 colony forming units per mL   Microscopic Examination     Status: Abnormal   Collection Time: 05/06/22  9:24 AM   Urine  Result Value Ref Range Status   WBC, UA 11-30 (A) 0 - 5 /hpf Final   RBC, Urine 0-2 0 - 2 /hpf Final   Epithelial Cells (non renal) >10 (A) 0 - 10 /hpf Final   Bacteria, UA Moderate (A) None seen/Few Final   Trichomonas, UA Present (A) None seen Final  GC/Chlamydia Probe Amp([facility])     Status: None   Collection Time: 05/06/22 11:48 AM   Specimen: Urine   UR  Result Value Ref Range Status   Chlamydia trachomatis, NAA Negative Negative Final   Neisseria Gonorrhoeae by PCR Negative Negative Final  Mycoplasma / Ureaplasma Culture     Status: Abnormal   Collection Time: 05/06/22 11:49 AM   Specimen: Genital   UR  Result Value Ref Range Status   Ureaplasma urealyticum Positive (A) Negative Final   Mycoplasma hominis Culture Negative Negative Final    Impression/Assessment:   1.  Elevated PSA   Plan:  Schedule for transrectal ultrasound/biopsy of prostate under sedation. The procedure has been discussed in detail including potential risks of bleeding, infection/sepsis.  All questions were answered and he desires to proceed.   05/25/2022, 7:24 AM  John Giovanni,  MD

## 2022-05-25 NOTE — Transfer of Care (Signed)
Immediate Anesthesia Transfer of Care Note  Patient: Tyler Cohen  Procedure(s) Performed: PROSTATE BIOPSY (Prostate) TRANSRECTAL ULTRASOUND  Patient Location: PACU  Anesthesia Type:General  Level of Consciousness: drowsy and patient cooperative  Airway & Oxygen Therapy: Patient Spontanous Breathing  Post-op Assessment: Report given to RN and Post -op Vital signs reviewed and stable  Post vital signs: Reviewed and stable  Last Vitals:  Vitals Value Taken Time  BP 89/64 05/25/22 0853  Temp    Pulse 71 05/25/22 0853  Resp 16 05/25/22 0853  SpO2 97 % 05/25/22 0853  Vitals shown include unvalidated device data.  Last Pain:  Vitals:   05/25/22 0702  TempSrc: Temporal  PainSc: 0-No pain         Complications: No notable events documented.

## 2022-05-25 NOTE — Anesthesia Postprocedure Evaluation (Signed)
Anesthesia Post Note  Patient: Tyler Cohen  Procedure(s) Performed: PROSTATE BIOPSY (Prostate) TRANSRECTAL ULTRASOUND  Patient location during evaluation: PACU Anesthesia Type: General Level of consciousness: awake and alert Pain management: pain level controlled Vital Signs Assessment: post-procedure vital signs reviewed and stable Respiratory status: spontaneous breathing, nonlabored ventilation and respiratory function stable Cardiovascular status: blood pressure returned to baseline and stable Postop Assessment: no apparent nausea or vomiting Anesthetic complications: no   No notable events documented.   Last Vitals:  Vitals:   05/25/22 0929 05/25/22 0940  BP: 100/66 101/67  Pulse:  (!) 56  Resp: 16 16  Temp: 36.4 C (!) 36.3 C  SpO2: 100% 100%    Last Pain:  Vitals:   05/25/22 0940  TempSrc: Temporal  PainSc: 0-No pain                 [place] Ouch

## 2022-05-25 NOTE — Discharge Instructions (Addendum)
Prostate biopsy discharge instructions  May resume regular activities in 24 hours; no strenuous activity for 48 hours Blood in the urine and from the rectum will be normal.   Please contact the office or proceed to the ED for heavy bleeding with clots, fever greater than 101 degrees or urinary difficulty Will be scheduled for a follow-up appointment for pathology results.  AMBULATORY SURGERY  DISCHARGE INSTRUCTIONS   The drugs that you were given will stay in your system until tomorrow so for the next 24 hours you should not:  Drive an automobile Make any legal decisions Drink any alcoholic beverage   You may resume regular meals tomorrow.  Today it is better to start with liquids and gradually work up to solid foods.  You may eat anything you prefer, but it is better to start with liquids, then soup and crackers, and gradually work up to solid foods.   Please notify your doctor immediately if you have any unusual bleeding, trouble breathing, redness and pain at the surgery site, drainage, fever, or pain not relieved by medication.    Additional Instructions:      Please contact your physician with any problems or Same Day Surgery at [phone], Monday through Friday 6 am to 4 pm, or [place] at [facility] number at [phone].

## 2022-05-25 NOTE — Op Note (Signed)
   Preoperative diagnosis:  Elevated PSA  Postoperative diagnosis:  Same  Procedure: Transrectal ultrasound prostate Transrectal prostate biopsies  Surgeon: Abbie Sons, MD  Anesthesia: MAC  Complications: None  Intraoperative findings:  DRE-diffuse firmness bilaterally TRUS-prostate volume calculated 24 cc.  No echogenic abnormalities appreciated  EBL: Minimal  Specimens: Standard 12 core template needle biopsies prostate  Indication: Tyler Cohen is a 67 y.o. with a rising PSA from 9.6-20 over the last 12 months.  After reviewing the management options for treatment, he elected to proceed with the above surgical procedure(s). We have discussed the potential benefits and risks of the procedure, side effects of the proposed treatment, the likelihood of the patient achieving the goals of the procedure, and any potential problems that might occur during the procedure or recuperation. Informed consent has been obtained.  Description of procedure:  The patient was taken to the operating room and was not as for to the OR table.  He was placed in the left lateral decubitus position with knees to chest and IV sedation was obtained by anesthesia. Preoperative antibiotics were administered. A preoperative time-out was performed.   Digital rectal was performed with findings as described above.  A transrectal ultrasound probe was lubricated and gently inserted per rectum.  Transrectal ultrasound was performed with findings as described above.  Standard 12 core template biopsies were then obtained.  No significant bleeding was noted.  He was transported to the PACU in stable condition.  There were no complications.  Plan: Will be scheduled for office follow-up with biopsy results in 1-2 weeks  Abbie Sons, M.D.

## 2022-05-26 ENCOUNTER — Ambulatory Visit: Payer: Medicare (Managed Care) | Admitting: Urology

## 2022-05-26 LAB — SURGICAL PATHOLOGY

## 2022-05-27 ENCOUNTER — Encounter: Payer: Self-pay | Admitting: Urology

## 2022-06-03 ENCOUNTER — Ambulatory Visit: Payer: Medicare (Managed Care) | Admitting: Urology

## 2022-06-03 ENCOUNTER — Telehealth: Payer: Self-pay | Admitting: Urology

## 2022-06-03 DIAGNOSIS — C61 Malignant neoplasm of prostate: Secondary | ICD-10-CM

## 2022-06-03 NOTE — Telephone Encounter (Signed)
I contacted [name] to discuss his prostate biopsy pathology.  He had no postbiopsy complaints.  6/12 cores were positive for Gleason 3+3/3+/4+3 adenocarcinoma.  I briefly discussed potential treatments provided he has no evidence of metastatic disease including radical prostatectomy and radiation therapy.  Will schedule a CT abdomen/pelvis with contrast for staging and a follow-up office visit to discuss results and treatment options.  All questions were answered and he was instructed to call back should he have any additional questions.

## 2022-06-16 ENCOUNTER — Other Ambulatory Visit: Payer: Self-pay

## 2022-06-16 ENCOUNTER — Emergency Department
Admission: EM | Admit: 2022-06-16 | Discharge: 2022-06-16 | Disposition: A | Discharge status: Home / Self Care | Payer: Medicare (Managed Care) | Attending: Student in an Organized Health Care Education/Training Program | Admitting: Student in an Organized Health Care Education/Training Program

## 2022-06-16 ENCOUNTER — Emergency Department: Payer: Medicare (Managed Care)

## 2022-06-16 DIAGNOSIS — R0789 Other chest pain: Secondary | ICD-10-CM

## 2022-06-16 DIAGNOSIS — Z87891 Personal history of nicotine dependence: Secondary | ICD-10-CM | POA: Diagnosis not present

## 2022-06-16 DIAGNOSIS — I509 Heart failure, unspecified: Secondary | ICD-10-CM | POA: Insufficient documentation

## 2022-06-16 DIAGNOSIS — R079 Chest pain, unspecified: Secondary | ICD-10-CM | POA: Diagnosis not present

## 2022-06-16 LAB — BASIC METABOLIC PANEL
Anion gap: 8 (ref 5–15)
BUN: 22 mg/dL (ref 8–23)
CO2: 26 mmol/L (ref 22–32)
Calcium: 9.1 mg/dL (ref 8.9–10.3)
Chloride: 101 mmol/L (ref 98–111)
Creatinine, Ser: 1.14 mg/dL (ref 0.61–1.24)
GFR, Estimated: >60 mL/min (ref >60)
Glucose, Bld: 98 mg/dL (ref 70–99)
Potassium: 3.7 mmol/L (ref 3.5–5.1)
Sodium: 135 mmol/L (ref 135–145)

## 2022-06-16 LAB — CBC
HCT: 50.3 % (ref 39.0–52.0)
Hemoglobin: 16.5 g/dL (ref 13.0–17.0)
MCH: 31.2 pg (ref 26.0–34.0)
MCHC: 32.8 g/dL (ref 30.0–36.0)
MCV: 95.1 fL (ref 80.0–100.0)
Platelets: 194 10*3/uL (ref 150–400)
RBC: 5.29 MIL/uL (ref 4.22–5.81)
RDW: 12.3 % (ref 11.5–15.5)
WBC: 6.7 10*3/uL (ref 4.0–10.5)
nRBC: 0 % (ref 0.0–0.2)

## 2022-06-16 LAB — TROPONIN I (HIGH SENSITIVITY)
Troponin I (High Sensitivity): 6 ng/L (ref <18)
Troponin I (High Sensitivity): 5 ng/L (ref <18)

## 2022-06-16 MED ORDER — ALUM & MAG HYDROXIDE-SIMETH 200-200-20 MG/5ML PO SUSP
30.0000 mL | Freq: Once | ORAL | Status: AC
  Administered 2022-06-16: 30 mL via ORAL
  Filled 2022-06-16: qty 30

## 2022-06-16 NOTE — ED Triage Notes (Signed)
Arrives c/o chest pain, onset yesterday.  C/O  mid chest pain.  States pain is intermittent, no aggravating or alleviating factors. Denies SOB

## 2022-06-16 NOTE — ED Provider Notes (Signed)
[facility] Provider Note    Event Date/Time   First MD Initiated Contact with Patient 06/16/22 1037     (approximate)   History   Chest Pain   HPI  Tyler Cohen is a 67 y.o. male with a history HLD, previous tobacco use, A-fib, CHF presents to the ER for evaluation of chest discomfort starting yesterday.  States its intermittent brief in nature stabbing midsternal nonradiating.  Denies any pleuritic pain no shortness of breath no fevers no chills.  Currently is pain-free.  Has had pain like this before but it has been several years.     Physical Exam   Triage Vital Signs: ED Triage Vitals  Enc Vitals Group     BP 06/16/22 1016 117/79     Pulse Rate 06/16/22 1016 79     Resp 06/16/22 1016 16     Temp 06/16/22 1016 97.8 F (36.6 C)     Temp Source 06/16/22 1016 Oral     SpO2 06/16/22 1016 93 %     Weight 06/16/22 1014 154 lb 15.7 oz (70.3 kg)     Height 06/16/22 1014 '6\' 1"'$  (1.854 m)     Head Circumference --      Peak Flow --      Pain Score 06/16/22 1014 0     Pain Loc --      Pain Edu? --      Excl. in [place]? --     Most recent vital signs: Vitals:   06/16/22 1043 06/16/22 1400  BP: 93/80 112/71  Pulse:  62  Resp: 20 13  Temp:    SpO2:  99%     Constitutional: Alert  Eyes: Conjunctivae are normal.  Head: Atraumatic. Nose: No congestion/rhinnorhea. Mouth/Throat: Mucous membranes are moist.   Neck: Painless ROM.  Cardiovascular:   Good peripheral circulation. Respiratory: Normal respiratory effort.  No retractions.  Gastrointestinal: Soft and nontender.  Musculoskeletal:  no deformity Neurologic:  MAE spontaneously. No gross focal neurologic deficits are appreciated.  Skin:  Skin is warm, dry and intact. No rash noted. Psychiatric: Mood and affect are normal. Speech and behavior are normal.    ED Results / Procedures / Treatments   Labs (all labs ordered are listed, but only abnormal results are displayed) Labs Reviewed   BASIC METABOLIC PANEL  CBC  TROPONIN I (HIGH SENSITIVITY)  TROPONIN I (HIGH SENSITIVITY)     EKG  ED ECG REPORT I, Merlyn Lot, the attending physician, personally viewed and interpreted this ECG.   Date: 06/16/2022  EKG Time: 10:22  Rate: 80  Rhythm: sinus  Axis: normal  Intervals: normal  ST&T Change: nonspecific st abn, no stemi    RADIOLOGY Please see ED Course for my review and interpretation.  I personally reviewed all radiographic images ordered to evaluate for the above acute complaints and reviewed radiology reports and findings.  These findings were personally discussed with the patient.  Please see medical record for radiology report.    PROCEDURES:  Critical Care performed: No  Procedures   MEDICATIONS ORDERED IN ED: Medications  alum & mag hydroxide-simeth (MAALOX/MYLANTA) 200-200-20 MG/5ML suspension 30 mL (30 mLs Oral Given 06/16/22 1117)     IMPRESSION / MDM / ASSESSMENT AND PLAN / ED COURSE  I reviewed the triage vital signs and the nursing notes.  Differential diagnosis includes, but is not limited to, ACS, pericarditis, esophagitis, boerhaaves, pe, dissection, pna, bronchitis, costochondritis  Patient presenting to the ER for evaluation of symptoms as described above.  Based on symptoms, risk factors and considered above differential, this presenting complaint could reflect a potentially life-threatening illness therefore the patient will be placed on continuous pulse oximetry and telemetry for monitoring.  Laboratory evaluation will be sent to evaluate for the above complaints.  Patient nontoxic-appearing stable in no acute distress.  EKG with some nonspecific changes seems less consistent with ACS but given his age and risk factors will order serial enzymes to observe for ACS.  He is on Eliquis have low suspicion for PE.  Chest x-ray on my review and interpretation without evidence of consolidation or  pneumothorax.  I do not appreciate any significant wheezing.  May have a component of reflux or esophagitis will give GI cocktail.  His abdominal exam is otherwise soft and benign.  Clinical Course as of 06/16/22 1422  Wed Jun 16, 2022  1203 Patient with improvement after gi cocktail  [PR]  [id number] Patient pain-free.  Troponin negative.  Has follow-up with cardiology and [place].  At this point does appear stable and appropriate for outpatient follow-up. [PR]    Clinical Course User Index [PR] Merlyn Lot, MD     FINAL CLINICAL IMPRESSION(S) / ED DIAGNOSES   Final diagnoses:  Atypical chest pain     Rx / DC Orders   ED Discharge Orders     None        Note:  This document was prepared using Dragon voice recognition software and may include unintentional dictation errors.    Merlyn Lot, MD 06/16/22 [phone]

## 2022-06-16 NOTE — Discharge Instructions (Signed)
Follow up with cardiology.  I recommend he take Pepcid or Tums for symptoms.  Return to the ER for any additional questions or concerns.

## 2022-06-17 ENCOUNTER — Ambulatory Visit
Admission: RE | Admit: 2022-06-17 | Discharge: 2022-06-17 | Disposition: A | Discharge status: Home / Self Care | Payer: Medicare (Managed Care) | Source: Ambulatory Visit | Attending: Urology | Admitting: Urology

## 2022-06-17 DIAGNOSIS — C61 Malignant neoplasm of prostate: Secondary | ICD-10-CM | POA: Diagnosis not present

## 2022-06-17 DIAGNOSIS — K7689 Other specified diseases of liver: Secondary | ICD-10-CM | POA: Diagnosis not present

## 2022-06-17 DIAGNOSIS — N281 Cyst of kidney, acquired: Secondary | ICD-10-CM | POA: Diagnosis not present

## 2022-06-17 MED ORDER — IOHEXOL 300 MG/ML  SOLN
100.0000 mL | Freq: Once | INTRAMUSCULAR | Status: AC | PRN
  Administered 2022-06-17: 100 mL via INTRAVENOUS

## 2022-06-21 DIAGNOSIS — E0590 Thyrotoxicosis, unspecified without thyrotoxic crisis or storm: Secondary | ICD-10-CM | POA: Diagnosis not present

## 2022-06-21 DIAGNOSIS — E042 Nontoxic multinodular goiter: Secondary | ICD-10-CM | POA: Diagnosis not present

## 2022-06-21 DIAGNOSIS — E278 Other specified disorders of adrenal gland: Secondary | ICD-10-CM | POA: Diagnosis not present

## 2022-06-23 ENCOUNTER — Telehealth: Payer: Self-pay | Admitting: *Deleted

## 2022-06-23 NOTE — Telephone Encounter (Signed)
Notified patient as instructed, patient pleased. Discussed follow-up appointments, patient agrees  

## 2022-06-23 NOTE — Telephone Encounter (Signed)
-----   Message from Abbie Sons, MD sent at 06/20/2022  8:55 PM EDT ----- CT showed no evidence of metastatic disease. Sched f/u visit to discuss treatment options

## 2022-07-05 ENCOUNTER — Encounter: Payer: Self-pay | Admitting: Urology

## 2022-07-05 ENCOUNTER — Ambulatory Visit (INDEPENDENT_AMBULATORY_CARE_PROVIDER_SITE_OTHER): Payer: Medicare (Managed Care) | Admitting: Urology

## 2022-07-05 VITALS — BP 93/65 | HR 93 | Ht 72.0 in | Wt 155.0 lb

## 2022-07-05 DIAGNOSIS — C61 Malignant neoplasm of prostate: Secondary | ICD-10-CM

## 2022-07-05 DIAGNOSIS — N2889 Other specified disorders of kidney and ureter: Secondary | ICD-10-CM

## 2022-07-05 NOTE — Progress Notes (Signed)
I, DeAsia L Maxie,acting as a scribe for Abbie Sons, MD.,have documented all relevant documentation on the behalf of Abbie Sons, MD,as directed by  Abbie Sons, MD while in the presence of Abbie Sons, MD.   07/05/22 8:46 AM   Tyler Cohen 1955/09/28 [id number]  Referring provider: Valerie Roys, DO [place],  [place] [id number]  Chief Complaint  Patient presents with   Elevated PSA    Urologic history: 1.  Small left renal mass Incidentally identified MRI September 2018 Elected surveillance  HPI: 67 y.o. male who presents for follow-up to discuss treatment options for unfavorable intermediate risk prostate cancer.  Status post prostate biopsy 05/25/22. Pathology: 6/12 cores were positive for Gleason 3+3/3+/4+3 adenocarcinoma CT abdomen/pelvis 06/17/22 showed stable renal mass and no findings for metastatic disease involving the abdomen/pelvis or bony structures. No bothersome LUTS Denies dysuria, gross hematuria Denies flank, abdominal or pelvic pain     PMH: Past Medical History:  Diagnosis Date   Aortic atherosclerosis    Arthritis    Atrial fibrillation    a.) CHA2DS2VASc = 4 (age, CHF, HTN, vascular disease history);  b.) s/p DCCV (300 J x 1) 05/18/2021; c.) rate/rhythm maintained on oral digoxin; chronically anticoagulated with apixaban   BPH (benign prostatic hyperplasia)    CHF (congestive heart failure)    a.) TTE 05/14/2021: EF 25-30%, glob HK, mod dil LV, RVE, PASP 39.8; b.) TTE 06/25/2021: EF 30%, LAE, triv AR/PR, mild TR, mod MR, G1DD   Elevated PSA    Heart murmur    Hepatic cyst    Hepatic hemangioma    HLD (hyperlipidemia)    HTN (hypertension)    Hypothyroidism    a.) thyrotoxicosis in 05/2021 (TSH < 0.010)   Long term current use of antiarrhythmic drug    a.) digoxin   Long term current use of anticoagulant    a.) apixaban   Macrocytosis    Multinodular goiter    NICM (nonischemic cardiomyopathy)    a.) TTE  05/14/2021: EF 25-30%; b.) TTE 06/25/2021: EF 30%   Right lower lobe pulmonary nodule 08/20/2021   a.) cCTA 08/20/2021: measured 7 x 5 mm.   Tobacco abuse     Surgical History: Past Surgical History:  Procedure Laterality Date   COLONOSCOPY WITH PROPOFOL N/A 12/01/2015   Procedure: COLONOSCOPY WITH PROPOFOL;  Surgeon: Lucilla Lame, MD;  Location: [place];  Service: Endoscopy;  Laterality: N/A;   POLYPECTOMY  12/01/2015   Procedure: POLYPECTOMY;  Surgeon: Lucilla Lame, MD;  Location: [place];  Service: Endoscopy;;   PROSTATE BIOPSY N/A 05/25/2022   Procedure: PROSTATE BIOPSY;  Surgeon: Abbie Sons, MD;  Location: [facility];  Service: Urology;  Laterality: N/A;   TEE WITH CARDIOVERSION N/A 05/18/2021   TRANSRECTAL ULTRASOUND N/A 05/25/2022   Procedure: TRANSRECTAL ULTRASOUND;  Surgeon: Abbie Sons, MD;  Location: [facility];  Service: Urology;  Laterality: N/A;    Home Medications:  Allergies as of 07/05/2022       Reactions   Lactose Intolerance (gi)         Medication List        Accurate as of July 05, 2022  8:46 AM. If you have any questions, ask your nurse or doctor.          albuterol 108 (90 Base) MCG/ACT inhaler Commonly known as: VENTOLIN HFA Inhale 2 puffs into the lungs every 6 (six) hours as needed for wheezing or shortness  of breath.   atorvastatin 20 MG tablet Commonly known as: LIPITOR TAKE 1 TABLET(20 MG) BY MOUTH AT BEDTIME   digoxin 0.125 MG tablet Commonly known as: LANOXIN Take 1 tablet (125 mcg total) by mouth daily.   Eliquis 5 MG Tabs tablet Generic drug: apixaban Take 1 tablet (5 mg total) by mouth 2 (two) times daily.   empagliflozin 10 MG Tabs tablet Commonly known as: JARDIANCE Take 1 tablet (10 mg total) by mouth daily.   furosemide 40 MG tablet Commonly known as: LASIX Take 1 tablet (40 mg total) by mouth daily.   methimazole 10 MG tablet Commonly known as: TAPAZOLE Take 1 tablet (10 mg total) by  mouth daily.   spironolactone 25 MG tablet Commonly known as: ALDACTONE Take 0.5 tablets (12.5 mg total) by mouth daily.   tamsulosin 0.4 MG Caps capsule Commonly known as: FLOMAX Take 1 capsule (0.4 mg total) by mouth daily.        Allergies:  Allergies  Allergen Reactions   Lactose Intolerance (Gi)     Family History: Family History  Problem Relation Age of Onset   Hypertension Mother    Diabetes Mother        lost both legs   Kidney disease Mother    Hypertension Father    Emphysema Father    Sickle cell trait Father    Hypertension Brother    Hyperlipidemia Brother    Sickle cell trait Sister     Social History:  reports that he quit smoking about 13 months ago. His smoking use included cigars and cigarettes. He has a 10.50 pack-year smoking history. He has never used smokeless tobacco. He reports current drug use. Drug: Marijuana. He reports that he does not drink alcohol.   Physical Exam: BP 93/65   Pulse 93   Ht 6' (1.829 m)   Wt 155 lb (70.3 kg)   BMI 21.02 kg/m   Constitutional:  Alert and oriented, No acute distress. HEENT: [place] AT, moist mucus membranes.  Trachea midline, no masses. Cardiovascular: No clubbing, cyanosis, or edema. Respiratory: Normal respiratory effort, no increased work of breathing. GI: Abdomen is soft, nontender, nondistended, no abdominal masses Skin: No rashes, bruises or suspicious lesions. Neurologic: Grossly intact, no focal deficits, moving all 4 extremities. Psychiatric: Normal mood and affect.  Pertinent Imaging: CT personally reviewed and interpreted.  EXAM: CT ABDOMEN AND PELVIS WITH CONTRAST   TECHNIQUE: Multidetector CT imaging of the abdomen and pelvis was performed using the standard protocol following bolus administration of intravenous contrast.   RADIATION DOSE REDUCTION: This exam was performed according to the departmental dose-optimization program which includes automated exposure control, adjustment  of the mA and/or kV according to patient size and/or use of iterative reconstruction technique.   CONTRAST:  110mL OMNIPAQUE IOHEXOL 300 MG/ML  SOLN   COMPARISON:  CT scan from 2019.  MRI abdomen 04/10/2021   FINDINGS: Lower chest: The lung bases are clear of an acute process. No pulmonary lesions. The heart is normal in size. No pericardial effusion.   Hepatobiliary: Stable scattered hepatic cysts and benign hepatic hemangiomas. No worrisome renal lesions. No intrahepatic biliary dilatation. The gallbladder is unremarkable. No common bile duct dilatation.   Pancreas: No mass, inflammation or ductal dilatation.   Spleen: Normal size.  No focal lesions.   Adrenals/Urinary Tract: Stable mild thickening of the lateral limb of the left adrenal gland but no discrete lesion. The right adrenal gland is normal.   Stable small partially calcified 11 mm  lesion involving the interpolar region of the left kidney. 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 Small simple right renal cysts.   Stomach/Bowel: The stomach, duodenum, small and colon are unremarkable. No acute inflammatory process, mass lesions or obstructive findings. The terminal ileum and appendix are normal.   Vascular/Lymphatic: The aorta and branch vessels are patent. No dissection. The major venous structures are patent. Incidental retroaortic left renal vein. No mesenteric or retroperitoneal mass or adenopathy. No pelvic or inguinal adenopathy.   Reproductive: The prostate gland demonstrates asymmetric enhancement along the left aspect of the gland may reflect the patient's cancer. The seminal vesicles are unremarkable.   Other: No pelvic mass or adenopathy. No free pelvic fluid collections. No inguinal mass or adenopathy. No abdominal wall hernia or subcutaneous lesions.   Musculoskeletal: No worrisome lytic or  sclerotic bone lesions to suggest metastatic disease.   IMPRESSION: 1. Asymmetric enhancement along the left aspect of the prostate gland may reflect the patient's cancer. 2. No findings for metastatic disease involving the abdomen/pelvis or bony structures. 3. 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 Recommend continued surveillance 4. Stable scattered hepatic cysts and benign hepatic hemangiomas.     Electronically Signed   By: Marijo Sanes M.D.   On: 06/20/2022 20:30  Assessment & Plan:    1.  T1c intermediate unfavorable risk prostate cancer We discussed curative treatment options for prostate cancer, including  robotic assisted radical prostatectomy and radiation modalities including IMRT and brachytherapy. We discussed pros and cons of each treatment and potential side effects. We also discussed there's not considered the best treatment option for prostate cancer and both of these treatments are typically considered equally effective. We did discuss there is a slightly higher risk of recurrence with radiation after 10-15 years compared to surgery. He is interested in pursuing radiation therapy and radiation oncology referral was placed.  2. Renal mass Surveillance for a 13 mm left renal mass identified on MRI in 2018. The mass is stable in recent CT imaging and he has elected continuous surveillance.  I have reviewed the above documentation for accuracy and completeness, and I agree with the above.   Abbie Sons, [place] [address] [place], [place] [address] [phone]

## 2022-07-13 ENCOUNTER — Encounter: Payer: Self-pay | Admitting: Radiation Oncology

## 2022-07-13 ENCOUNTER — Ambulatory Visit
Admission: RE | Admit: 2022-07-13 | Discharge: 2022-07-13 | Disposition: A | Discharge status: Home / Self Care | Payer: Medicare (Managed Care) | Source: Ambulatory Visit | Attending: Radiation Oncology | Admitting: Radiation Oncology

## 2022-07-13 VITALS — BP 119/77 | HR 66 | Temp 97.5°F | Resp 16 | Ht 73.0 in | Wt 158.2 lb

## 2022-07-13 DIAGNOSIS — E039 Hypothyroidism, unspecified: Secondary | ICD-10-CM | POA: Insufficient documentation

## 2022-07-13 DIAGNOSIS — Z825 Family history of asthma and other chronic lower respiratory diseases: Secondary | ICD-10-CM | POA: Insufficient documentation

## 2022-07-13 DIAGNOSIS — N400 Enlarged prostate without lower urinary tract symptoms: Secondary | ICD-10-CM | POA: Insufficient documentation

## 2022-07-13 DIAGNOSIS — Z79899 Other long term (current) drug therapy: Secondary | ICD-10-CM | POA: Insufficient documentation

## 2022-07-13 DIAGNOSIS — Z8349 Family history of other endocrine, nutritional and metabolic diseases: Secondary | ICD-10-CM | POA: Insufficient documentation

## 2022-07-13 DIAGNOSIS — F1290 Cannabis use, unspecified, uncomplicated: Secondary | ICD-10-CM | POA: Insufficient documentation

## 2022-07-13 DIAGNOSIS — Z7901 Long term (current) use of anticoagulants: Secondary | ICD-10-CM | POA: Insufficient documentation

## 2022-07-13 DIAGNOSIS — I4891 Unspecified atrial fibrillation: Secondary | ICD-10-CM | POA: Insufficient documentation

## 2022-07-13 DIAGNOSIS — Z833 Family history of diabetes mellitus: Secondary | ICD-10-CM | POA: Insufficient documentation

## 2022-07-13 DIAGNOSIS — I110 Hypertensive heart disease with heart failure: Secondary | ICD-10-CM | POA: Insufficient documentation

## 2022-07-13 DIAGNOSIS — E785 Hyperlipidemia, unspecified: Secondary | ICD-10-CM | POA: Insufficient documentation

## 2022-07-13 DIAGNOSIS — C61 Malignant neoplasm of prostate: Secondary | ICD-10-CM

## 2022-07-13 DIAGNOSIS — Z832 Family history of diseases of the blood and blood-forming organs and certain disorders involving the immune mechanism: Secondary | ICD-10-CM | POA: Insufficient documentation

## 2022-07-13 DIAGNOSIS — Z8249 Family history of ischemic heart disease and other diseases of the circulatory system: Secondary | ICD-10-CM | POA: Insufficient documentation

## 2022-07-13 DIAGNOSIS — F17210 Nicotine dependence, cigarettes, uncomplicated: Secondary | ICD-10-CM | POA: Insufficient documentation

## 2022-07-13 DIAGNOSIS — I509 Heart failure, unspecified: Secondary | ICD-10-CM | POA: Insufficient documentation

## 2022-07-13 NOTE — Consult Note (Signed)
NEW PATIENT EVALUATION  Name: Tyler Cohen  MRN: [id number]  Date:   07/13/2022     DOB: 1955-06-06   This 67 y.o. male patient presents to the clinic for initial evaluation of stage IIIa (cT1 cN0 M0) Gleason 7 (4+3) adenocarcinoma the prostate presenting with a PSA in the 20 range.  REFERRING PHYSICIAN: Dorcas Carrow, DO  CHIEF COMPLAINT:  Chief Complaint  Patient presents with   Prostate Cancer    Consult    DIAGNOSIS: The encounter diagnosis was Malignant neoplasm of prostate.   PREVIOUS INVESTIGATIONS:  CT scan abdomen pelvis reviewed Pathology report reviewed Clinical notes reviewed  HPI: Patient is a 67 year old male who originally first presented back in 2018 with multiple liver lesions which on MRI were benign hemangiomas.  Incidental finding at that time was significant with a 1.3 cm enhancing lesion left lateral upper pole of the kidney worrisome for solid renal neoplasm.  This has been followed by Dr. Lonna Cobb most recently back in January the lesion was stable.  He was noted to have a rising PSA starting with a PSA of 5 2 years prior now up to 20.8.  He was seen by urology underwent transrectal ultrasound-guided biopsy showing 6 out of 12 cores positive for mixture of Gleason 6 Gleason 7 with several cores positive for Gleason 7 (4+3).  Patient claims he is asymptomatic specifically denies any increased lower urinary tract symptoms diarrhea or fatigue.  Patient has multiple comorbidities including atrial fibrillation congestive heart failure long-term anticoagulant therapy.  Treatment options were discussed with the patient he is seen today for radiation oncology consultation.  PLANNED TREATMENT REGIMEN: Image guided IMRT radiation therapy along with ADT therapy  PAST MEDICAL HISTORY:  has a past medical history of Aortic atherosclerosis, Arthritis, Atrial fibrillation, BPH (benign prostatic hyperplasia), CHF (congestive heart failure), Elevated PSA, Heart murmur, Hepatic  cyst, Hepatic hemangioma, HLD (hyperlipidemia), HTN (hypertension), Hypothyroidism, Long term current use of antiarrhythmic drug, Long term current use of anticoagulant, Macrocytosis, Multinodular goiter, NICM (nonischemic cardiomyopathy), Right lower lobe pulmonary nodule (08/20/2021), and Tobacco abuse.    PAST SURGICAL HISTORY:  Past Surgical History:  Procedure Laterality Date   COLONOSCOPY WITH PROPOFOL N/A 12/01/2015   Procedure: COLONOSCOPY WITH PROPOFOL;  Surgeon: Midge Minium, MD;  [facility];  Service: Endoscopy;  Laterality: N/A;   POLYPECTOMY  12/01/2015   Procedure: POLYPECTOMY;  Surgeon: Midge Minium, MD;  Location: [facility];  Service: Endoscopy;;   PROSTATE BIOPSY N/A 05/25/2022   Procedure: PROSTATE BIOPSY;  Surgeon: Riki Altes, MD;  Location: [facility];  Service: Urology;  Laterality: N/A;   TEE WITH CARDIOVERSION N/A 05/18/2021   TRANSRECTAL ULTRASOUND N/A 05/25/2022   Procedure: TRANSRECTAL ULTRASOUND;  Surgeon: Riki Altes, MD;  Location: [facility];  Service: Urology;  Laterality: N/A;    FAMILY HISTORY: family history includes Diabetes in his mother; Emphysema in his father; Hyperlipidemia in his brother; Hypertension in his brother, father, and mother; Kidney disease in his mother; Sickle cell trait in his father and sister.  SOCIAL HISTORY:  reports that he quit smoking about 13 months ago. His smoking use included cigars and cigarettes. He has a 10.50 pack-year smoking history. He has never used smokeless tobacco. He reports current drug use. Drug: Marijuana. He reports that he does not drink alcohol.  ALLERGIES: Lactose intolerance (gi)  MEDICATIONS:  Current Outpatient Medications  Medication Sig Dispense Refill   albuterol (VENTOLIN HFA) 108 (90 Base) MCG/ACT inhaler Inhale 2 puffs into  the lungs every 6 (six) hours as needed for wheezing or shortness of breath. 1 each 12   atorvastatin (LIPITOR) 20 MG tablet TAKE 1 TABLET(20 MG)  BY MOUTH AT BEDTIME 90 tablet 1   digoxin (LANOXIN) 0.125 MG tablet Take 1 tablet (125 mcg total) by mouth daily. 90 tablet 1   ELIQUIS 5 MG TABS tablet Take 1 tablet (5 mg total) by mouth 2 (two) times daily. 180 tablet 1   empagliflozin (JARDIANCE) 10 MG TABS tablet Take 1 tablet (10 mg total) by mouth daily. 90 tablet 1   furosemide (LASIX) 40 MG tablet Take 1 tablet (40 mg total) by mouth daily. 90 tablet 1   methimazole (TAPAZOLE) 10 MG tablet Take 1 tablet (10 mg total) by mouth daily. 90 tablet 1   spironolactone (ALDACTONE) 25 MG tablet Take 0.5 tablets (12.5 mg total) by mouth daily. 45 tablet 1   tamsulosin (FLOMAX) 0.4 MG CAPS capsule Take 1 capsule (0.4 mg total) by mouth daily. 90 capsule 1   No current facility-administered medications for this encounter.    ECOG PERFORMANCE STATUS:  0 - Asymptomatic  REVIEW OF SYSTEMS: Patient does have a history of hypertension hypothyroidism hepatic cysts hepatic and angiomas atrial fibrillation aortic atherosclerosis congestive heart failure Patient denies any weight loss, fatigue, weakness, fever, chills or night sweats. Patient denies any loss of vision, blurred vision. Patient denies any ringing  of the ears or hearing loss. No irregular heartbeat. Patient denies heart murmur or history of fainting. Patient denies any chest pain or pain radiating to her upper extremities. Patient denies any shortness of breath, difficulty breathing at night, cough or hemoptysis. Patient denies any swelling in the lower legs. Patient denies any nausea vomiting, vomiting of blood, or coffee ground material in the vomitus. Patient denies any stomach pain. Patient states has had normal bowel movements no significant constipation or diarrhea. Patient denies any dysuria, hematuria or significant nocturia. Patient denies any problems walking, swelling in the joints or loss of balance. Patient denies any skin changes, loss of hair or loss of weight. Patient denies any  excessive worrying or anxiety or significant depression. Patient denies any problems with insomnia. Patient denies excessive thirst, polyuria, polydipsia. Patient denies any swollen glands, patient denies easy bruising or easy bleeding. Patient denies any recent infections, allergies or URI. Patient "s visual fields have not changed significantly in recent time.   PHYSICAL EXAM: BP 119/77   Pulse 66   Temp (!) 97.5 F (36.4 C)   Resp 16   Ht 6\' 1"  (1.854 m)   Wt 158 lb 3.2 oz (71.8 kg)   BMI 20.87 kg/m  Well-developed well-nourished patient in NAD. HEENT reveals PERLA, EOMI, discs not visualized.  Oral cavity is clear. No oral mucosal lesions are identified. Neck is clear without evidence of cervical or supraclavicular adenopathy. Lungs are clear to A&P. Cardiac examination is essentially unremarkable with regular rate and rhythm without murmur rub or thrill. Abdomen is benign with no organomegaly or masses noted. Motor sensory and DTR levels are equal and symmetric in the upper and lower extremities. Cranial nerves II through XII are grossly intact. Proprioception is intact. No peripheral adenopathy or edema is identified. No motor or sensory levels are noted. Crude visual fields are within normal range.  LABORATORY DATA: Pathology reports reviewed    RADIOLOGY RESULTS: CT scans reviewed compatible with above-stated findings   IMPRESSION: Stage IIIa Gleason 7 (4+3) adenocarcinoma the prostate presenting with a PSA in the 20  range in 67 year old male  PLAN: Time I have run the [employer] theShowing at 80% chance of extracapsular extension as well as a 25% chance of lymph node involvement.  Based on his comorbidities would recommend IMRT image guided radiation therapy to his prostate and pelvic nodes.  I have asked him to return to Dr. Lonna Cobb for fiducial markers placement as well as starting him on ADT therapy.  I will plan on delivering [address] Cullens to his prostate treating  his pelvic lymph nodes to 54 Gray using IMRT dose painting technique risks and benefits of treatment occluding increased lower urinary tract symptoms diarrhea fatigue alteration blood counts and possible skin reaction all were described in detail to the patient.  I will set up a simulation after his markers are placed.  Patient comprehends my recommendations well.  I would like to take this opportunity to thank you for allowing me to participate in the care of your patient.Carmina Miller, MD

## 2022-07-14 ENCOUNTER — Telehealth: Payer: Self-pay

## 2022-07-14 NOTE — Telephone Encounter (Signed)
Eligard approval form faxed to [employer], awaiting response.

## 2022-07-14 NOTE — Telephone Encounter (Signed)
-----   Message from Dorcas Carrow, RN sent at 07/13/2022  9:15 AM EDT ----- Tyler Cohen Morning Ladies,   This patient will need to have Markers placed and an Eligard injection.  He has the markers and will bring them with him.    Thanks, Ricki Rodriguez Radiation Oncology

## 2022-07-16 NOTE — Telephone Encounter (Signed)
Eligard approved.   Auth# [id number]  Dates: 07/15/22 - 07/15/23.   Pt scheduled.

## 2022-07-22 ENCOUNTER — Ambulatory Visit: Payer: Medicare (Managed Care) | Admitting: Family Medicine

## 2022-07-27 DIAGNOSIS — C61 Malignant neoplasm of prostate: Secondary | ICD-10-CM | POA: Diagnosis not present

## 2022-07-27 DIAGNOSIS — Z191 Hormone sensitive malignancy status: Secondary | ICD-10-CM | POA: Diagnosis not present

## 2022-07-28 DIAGNOSIS — E785 Hyperlipidemia, unspecified: Secondary | ICD-10-CM | POA: Diagnosis not present

## 2022-07-28 DIAGNOSIS — Z720 Tobacco use: Secondary | ICD-10-CM | POA: Diagnosis not present

## 2022-07-28 DIAGNOSIS — Z8679 Personal history of other diseases of the circulatory system: Secondary | ICD-10-CM | POA: Diagnosis not present

## 2022-07-28 DIAGNOSIS — C61 Malignant neoplasm of prostate: Secondary | ICD-10-CM | POA: Diagnosis not present

## 2022-07-28 DIAGNOSIS — E0521 Thyrotoxicosis with toxic multinodular goiter with thyrotoxic crisis or storm: Secondary | ICD-10-CM | POA: Diagnosis not present

## 2022-07-28 DIAGNOSIS — I5022 Chronic systolic (congestive) heart failure: Secondary | ICD-10-CM | POA: Diagnosis not present

## 2022-07-28 DIAGNOSIS — I700 Atherosclerosis of aorta: Secondary | ICD-10-CM | POA: Diagnosis not present

## 2022-07-28 DIAGNOSIS — I429 Cardiomyopathy, unspecified: Secondary | ICD-10-CM | POA: Diagnosis not present

## 2022-07-28 DIAGNOSIS — I480 Paroxysmal atrial fibrillation: Secondary | ICD-10-CM | POA: Diagnosis not present

## 2022-07-29 ENCOUNTER — Other Ambulatory Visit: Payer: Self-pay | Admitting: Family Medicine

## 2022-07-29 NOTE — Telephone Encounter (Signed)
Requested medications are due for refill today.  Yes - a little early  Requested medications are on the active medications list.  yes  Last refill. 03/25/2022 #90 1 rf  Future visit scheduled.   no  Notes to clinic.  Abnormal labs.    Requested Prescriptions  Pending Prescriptions Disp Refills   tamsulosin (FLOMAX) 0.4 MG CAPS capsule [Pharmacy Med Name: TAMSULOSIN HCL 0.4 MG CAP] 90 capsule 1    Sig: TAKE 1 CAPSULE BY MOUTH ONCE DAILY 30 MINUTES AFTER LARGEST MEAL.     Urology: Alpha-Adrenergic Blocker Failed - 07/29/2022 12:47 PM      Failed - PSA in normal range and within 360 days    Prostate Specific Ag, Serum  Date Value Ref Range Status  04/22/2022 20.8 (H) 0.0 - 4.0 ng/mL Final    Comment:    Roche ECLIA methodology. According to the American Urological Association, Serum PSA should decrease and remain at undetectable levels after radical prostatectomy. The AUA defines biochemical recurrence as an initial PSA value 0.2 ng/mL or greater followed by a subsequent confirmatory PSA value 0.2 ng/mL or greater. Values obtained with different assay methods or kits cannot be used interchangeably. Results cannot be interpreted as absolute evidence of the presence or absence of malignant disease.          Passed - Last BP in normal range    BP Readings from Last 1 Encounters:  07/13/22 119/77         Passed - Valid encounter within last 12 months    Recent Outpatient Visits           3 months ago Aortic atherosclerosis (HCC)   [place] [facility] [place], Megan P, DO   4 months ago Routine general medical examination at a health care facility   [facility], [place] P, DO   8 months ago Abscess of right little finger   [place] [facility] [place], [place], DO   1 year ago Hyperthyroidism   [place] [facility] [place], [place], DO   1 year ago Hyperthyroidism   [place] [facility] [place], [place], DO       Future Appointments             In 2 weeks Gollan, Tollie Pizza, MD [facility]            Signed Prescriptions Disp Refills   atorvastatin (LIPITOR) 20 MG tablet 90 tablet 2    Sig: TAKE 1 TABLET BY MOUTH AT BEDTIME     Cardiovascular:  Antilipid - Statins Failed - 07/29/2022 12:47 PM      Failed - Lipid Panel in normal range within the last 12 months    Cholesterol, Total  Date Value Ref Range Status  04/22/2022 139 100 - 199 mg/dL Final   LDL Chol Calc (NIH)  Date Value Ref Range Status  04/22/2022 77 0 - 99 mg/dL Final   HDL  Date Value Ref Range Status  04/22/2022 47 >39 mg/dL Final   Triglycerides  Date Value Ref Range Status  04/22/2022 77 0 - 149 mg/dL Final         Passed - Patient is not pregnant      Passed - Valid encounter within last 12 months    Recent Outpatient Visits           3 months ago Aortic atherosclerosis (HCC)   [place]  [facility] [place], Megan P, DO   4 months ago Routine general medical examination at a health care facility   [facility] [place], [place] P, DO   8 months ago Abscess of right little finger   [place] [facility] [place], [place], DO   1 year ago Hyperthyroidism   [place] [facility] [place], Gisela, DO   1 year ago Hyperthyroidism   [place] [facility] [place] Dorcas Carrow, DO       Future Appointments             In 2 weeks Gollan, Tollie Pizza, MD [facility]

## 2022-07-29 NOTE — Telephone Encounter (Signed)
Requested Prescriptions  Pending Prescriptions Disp Refills   tamsulosin (FLOMAX) 0.4 MG CAPS capsule [Pharmacy Med Name: TAMSULOSIN HCL 0.4 MG CAP] 90 capsule 1    Sig: TAKE 1 CAPSULE BY MOUTH ONCE DAILY 30 MINUTES AFTER LARGEST MEAL.     Urology: Alpha-Adrenergic Blocker Failed - 07/29/2022 12:47 PM      Failed - PSA in normal range and within 360 days    Prostate Specific Ag, Serum  Date Value Ref Range Status  04/22/2022 20.8 (H) 0.0 - 4.0 ng/mL Final    Comment:    Roche ECLIA methodology. According to the American Urological Association, Serum PSA should decrease and remain at undetectable levels after radical prostatectomy. The AUA defines biochemical recurrence as an initial PSA value 0.2 ng/mL or greater followed by a subsequent confirmatory PSA value 0.2 ng/mL or greater. Values obtained with different assay methods or kits cannot be used interchangeably. Results cannot be interpreted as absolute evidence of the presence or absence of malignant disease.          Passed - Last BP in normal range    BP Readings from Last 1 Encounters:  07/13/22 119/77         Passed - Valid encounter within last 12 months    Recent Outpatient Visits           3 months ago Aortic atherosclerosis (HCC)   [facility] [place], Megan P, DO   4 months ago Routine general medical examination at a health care facility   [facility], [place] P, DO   8 months ago Abscess of right little finger   [place] [facility] [place], [place], DO   1 year ago Hyperthyroidism   [place] [facility] [place], [place], DO   1 year ago Hyperthyroidism   [place] [facility] [place], [place], DO       Future Appointments             In 2 weeks Gollan, Tollie Pizza, MD [facility]             atorvastatin (LIPITOR) 20 MG tablet [Pharmacy Med Name: ATORVASTATIN CALCIUM 20 MG  TAB] 90 tablet 2    Sig: TAKE 1 TABLET BY MOUTH AT BEDTIME     Cardiovascular:  Antilipid - Statins Failed - 07/29/2022 12:47 PM      Failed - Lipid Panel in normal range within the last 12 months    Cholesterol, Total  Date Value Ref Range Status  04/22/2022 139 100 - 199 mg/dL Final   LDL Chol Calc (NIH)  Date Value Ref Range Status  04/22/2022 77 0 - 99 mg/dL Final   HDL  Date Value Ref Range Status  04/22/2022 47 >39 mg/dL Final   Triglycerides  Date Value Ref Range Status  04/22/2022 77 0 - 149 mg/dL Final         Passed - Patient is not pregnant      Passed - Valid encounter within last 12 months    Recent Outpatient Visits           3 months ago Aortic atherosclerosis (HCC)   [facility] [place], Megan P, DO   4 months ago Routine general medical examination at a health care facility   [facility], [place] P, DO   8 months ago Abscess of right little finger   [facility] [place], [place], DO   1 year ago Hyperthyroidism   [facility] [place], [place], DO   1 year ago Hyperthyroidism   [place] [facility] [place], Oralia Rud, DO       Future Appointments             In 2 weeks Gollan, Tollie Pizza, MD [facility]

## 2022-07-30 ENCOUNTER — Ambulatory Visit (INDEPENDENT_AMBULATORY_CARE_PROVIDER_SITE_OTHER): Payer: Medicare (Managed Care) | Admitting: Urology

## 2022-07-30 ENCOUNTER — Encounter: Payer: Self-pay | Admitting: Urology

## 2022-07-30 VITALS — BP 101/67 | HR 90 | Ht 73.0 in | Wt 155.0 lb

## 2022-07-30 DIAGNOSIS — Z2989 Encounter for other specified prophylactic measures: Secondary | ICD-10-CM | POA: Diagnosis not present

## 2022-07-30 DIAGNOSIS — C61 Malignant neoplasm of prostate: Secondary | ICD-10-CM | POA: Diagnosis not present

## 2022-07-30 MED ORDER — LEUPROLIDE ACETATE (6 MONTH) 45 MG ~~LOC~~ KIT
45.0000 mg | PACK | Freq: Once | SUBCUTANEOUS | Status: AC
  Administered 2022-07-30: 45 mg via SUBCUTANEOUS

## 2022-07-30 MED ORDER — LEVOFLOXACIN 500 MG PO TABS
500.0000 mg | ORAL_TABLET | Freq: Once | ORAL | Status: AC
  Administered 2022-07-30: 500 mg via ORAL

## 2022-07-30 MED ORDER — GENTAMICIN SULFATE 40 MG/ML IJ SOLN
80.0000 mg | Freq: Once | INTRAMUSCULAR | Status: AC
  Administered 2022-07-30: 80 mg via INTRAMUSCULAR

## 2022-07-30 NOTE — Progress Notes (Signed)
   07/30/22  CC: gold fiducial marker placement  HPI: 67 y.o. male with prostate cancer who presents today for placement of fiducial seed markers in anticipation of his upcoming IMRT with Dr. Rushie Chestnut.  Prostate Gold fiducial Marker Placement Procedure   Informed consent was obtained after discussing risks/benefits of the procedure.  A time out was performed to ensure correct patient identity.  Pre-Procedure: - Gentamicin given prophylactically - PO Levaquin 500 mg also given today  Procedure: - Lidocaine jelly was administered per rectum - Rectal ultrasound probe was placed without difficulty and the prostate visualized - Prostatic block performed with 10 mL 1% Xylocaine - 3 fiducial gold seed markers placed, one at right base, one at left base, one at apex of prostate gland under transrectal ultrasound guidance  Post-Procedure: - Patient tolerated the procedure well - He was counseled to seek immediate medical attention if experiences any severe pain, significant bleeding, or fevers -He also received 70-month leuprolide injection.  Dr. Rushie Chestnut messaged and he recommended ADT X    Irineo Axon, MD

## 2022-07-30 NOTE — Progress Notes (Signed)
Eligard SubQ Injection   Due to Prostate Cancer patient is present today for a Eligard Injection.  Medication: Eligard 6 month Dose: 45 mg  Location: left  Lot: [id number] Exp: 08/2023  Patient tolerated well, no complications were noted  Performed by: Ples Specter  Per Dr. Lonna Cobb  patient is to continue therapy for TBA. given to patient today along with reminder continue on Vitamin D 800-1000iu and Calcium 1000-1200mg  daily while on Androgen Deprivation Therapy.  PA approval dates:

## 2022-08-02 ENCOUNTER — Ambulatory Visit
Admission: RE | Admit: 2022-08-02 | Discharge: 2022-08-02 | Disposition: A | Discharge status: Home / Self Care | Payer: Medicare (Managed Care) | Source: Ambulatory Visit | Attending: Radiation Oncology | Admitting: Radiation Oncology

## 2022-08-02 DIAGNOSIS — I509 Heart failure, unspecified: Secondary | ICD-10-CM | POA: Diagnosis not present

## 2022-08-02 DIAGNOSIS — Z79899 Other long term (current) drug therapy: Secondary | ICD-10-CM | POA: Diagnosis not present

## 2022-08-02 DIAGNOSIS — Z191 Hormone sensitive malignancy status: Secondary | ICD-10-CM | POA: Diagnosis not present

## 2022-08-02 DIAGNOSIS — C61 Malignant neoplasm of prostate: Secondary | ICD-10-CM | POA: Diagnosis not present

## 2022-08-02 DIAGNOSIS — Z833 Family history of diabetes mellitus: Secondary | ICD-10-CM | POA: Diagnosis not present

## 2022-08-02 DIAGNOSIS — Z7901 Long term (current) use of anticoagulants: Secondary | ICD-10-CM | POA: Diagnosis not present

## 2022-08-02 DIAGNOSIS — Z8349 Family history of other endocrine, nutritional and metabolic diseases: Secondary | ICD-10-CM | POA: Diagnosis not present

## 2022-08-02 DIAGNOSIS — F1290 Cannabis use, unspecified, uncomplicated: Secondary | ICD-10-CM | POA: Diagnosis not present

## 2022-08-02 DIAGNOSIS — I110 Hypertensive heart disease with heart failure: Secondary | ICD-10-CM | POA: Diagnosis not present

## 2022-08-02 DIAGNOSIS — E039 Hypothyroidism, unspecified: Secondary | ICD-10-CM | POA: Diagnosis not present

## 2022-08-02 DIAGNOSIS — N400 Enlarged prostate without lower urinary tract symptoms: Secondary | ICD-10-CM | POA: Diagnosis not present

## 2022-08-02 DIAGNOSIS — E785 Hyperlipidemia, unspecified: Secondary | ICD-10-CM | POA: Diagnosis not present

## 2022-08-02 DIAGNOSIS — Z8249 Family history of ischemic heart disease and other diseases of the circulatory system: Secondary | ICD-10-CM | POA: Diagnosis not present

## 2022-08-02 DIAGNOSIS — I4891 Unspecified atrial fibrillation: Secondary | ICD-10-CM | POA: Diagnosis not present

## 2022-08-02 DIAGNOSIS — F17210 Nicotine dependence, cigarettes, uncomplicated: Secondary | ICD-10-CM | POA: Diagnosis not present

## 2022-08-02 DIAGNOSIS — Z832 Family history of diseases of the blood and blood-forming organs and certain disorders involving the immune mechanism: Secondary | ICD-10-CM | POA: Diagnosis not present

## 2022-08-02 DIAGNOSIS — Z825 Family history of asthma and other chronic lower respiratory diseases: Secondary | ICD-10-CM | POA: Diagnosis not present

## 2022-08-05 DIAGNOSIS — E042 Nontoxic multinodular goiter: Secondary | ICD-10-CM | POA: Diagnosis not present

## 2022-08-06 ENCOUNTER — Other Ambulatory Visit: Payer: Self-pay | Admitting: *Deleted

## 2022-08-06 DIAGNOSIS — C61 Malignant neoplasm of prostate: Secondary | ICD-10-CM

## 2022-08-09 DIAGNOSIS — I4891 Unspecified atrial fibrillation: Secondary | ICD-10-CM | POA: Insufficient documentation

## 2022-08-09 DIAGNOSIS — I110 Hypertensive heart disease with heart failure: Secondary | ICD-10-CM | POA: Diagnosis not present

## 2022-08-09 DIAGNOSIS — Z8249 Family history of ischemic heart disease and other diseases of the circulatory system: Secondary | ICD-10-CM | POA: Diagnosis not present

## 2022-08-09 DIAGNOSIS — E785 Hyperlipidemia, unspecified: Secondary | ICD-10-CM | POA: Insufficient documentation

## 2022-08-09 DIAGNOSIS — Z832 Family history of diseases of the blood and blood-forming organs and certain disorders involving the immune mechanism: Secondary | ICD-10-CM | POA: Insufficient documentation

## 2022-08-09 DIAGNOSIS — Z7901 Long term (current) use of anticoagulants: Secondary | ICD-10-CM | POA: Insufficient documentation

## 2022-08-09 DIAGNOSIS — I509 Heart failure, unspecified: Secondary | ICD-10-CM | POA: Diagnosis not present

## 2022-08-09 DIAGNOSIS — Z79899 Other long term (current) drug therapy: Secondary | ICD-10-CM | POA: Diagnosis not present

## 2022-08-09 DIAGNOSIS — E039 Hypothyroidism, unspecified: Secondary | ICD-10-CM | POA: Diagnosis not present

## 2022-08-09 DIAGNOSIS — Z833 Family history of diabetes mellitus: Secondary | ICD-10-CM | POA: Insufficient documentation

## 2022-08-09 DIAGNOSIS — C61 Malignant neoplasm of prostate: Secondary | ICD-10-CM | POA: Diagnosis not present

## 2022-08-09 DIAGNOSIS — Z8349 Family history of other endocrine, nutritional and metabolic diseases: Secondary | ICD-10-CM | POA: Insufficient documentation

## 2022-08-09 DIAGNOSIS — Z825 Family history of asthma and other chronic lower respiratory diseases: Secondary | ICD-10-CM | POA: Diagnosis not present

## 2022-08-09 DIAGNOSIS — N400 Enlarged prostate without lower urinary tract symptoms: Secondary | ICD-10-CM | POA: Insufficient documentation

## 2022-08-09 DIAGNOSIS — F1290 Cannabis use, unspecified, uncomplicated: Secondary | ICD-10-CM | POA: Insufficient documentation

## 2022-08-09 DIAGNOSIS — F17210 Nicotine dependence, cigarettes, uncomplicated: Secondary | ICD-10-CM | POA: Diagnosis not present

## 2022-08-11 ENCOUNTER — Ambulatory Visit: Admission: RE | Admit: 2022-08-11 | Payer: Medicare (Managed Care) | Source: Ambulatory Visit

## 2022-08-12 ENCOUNTER — Ambulatory Visit
Admission: RE | Admit: 2022-08-12 | Discharge: 2022-08-12 | Disposition: A | Discharge status: Home / Self Care | Payer: Medicare (Managed Care) | Source: Ambulatory Visit | Attending: Radiation Oncology | Admitting: Radiation Oncology

## 2022-08-12 ENCOUNTER — Other Ambulatory Visit: Payer: Self-pay

## 2022-08-12 DIAGNOSIS — C61 Malignant neoplasm of prostate: Secondary | ICD-10-CM | POA: Diagnosis not present

## 2022-08-12 LAB — RAD ONC ARIA SESSION SUMMARY
Course Elapsed Days: 0
Plan Fractions Treated to Date: 1
Plan Prescribed Dose Per Fraction: 2 Gy
Plan Total Fractions Prescribed: 40
Plan Total Prescribed Dose: 80 Gy
Reference Point Dosage Given to Date: 2 Gy
Reference Point Session Dosage Given: 2 Gy
Session Number: 1

## 2022-08-13 ENCOUNTER — Ambulatory Visit
Admission: RE | Admit: 2022-08-13 | Discharge: 2022-08-13 | Disposition: A | Discharge status: Home / Self Care | Payer: Medicare (Managed Care) | Source: Ambulatory Visit | Attending: Radiation Oncology | Admitting: Radiation Oncology

## 2022-08-13 ENCOUNTER — Other Ambulatory Visit: Payer: Self-pay

## 2022-08-13 DIAGNOSIS — C61 Malignant neoplasm of prostate: Secondary | ICD-10-CM | POA: Diagnosis not present

## 2022-08-13 LAB — RAD ONC ARIA SESSION SUMMARY
Course Elapsed Days: 1
Plan Fractions Treated to Date: 2
Plan Prescribed Dose Per Fraction: 2 Gy
Plan Total Fractions Prescribed: 40
Plan Total Prescribed Dose: 80 Gy
Reference Point Dosage Given to Date: 4 Gy
Reference Point Session Dosage Given: 2 Gy
Session Number: 2

## 2022-08-16 ENCOUNTER — Ambulatory Visit
Admission: RE | Admit: 2022-08-16 | Discharge: 2022-08-16 | Disposition: A | Discharge status: Home / Self Care | Payer: Medicare (Managed Care) | Source: Ambulatory Visit | Attending: Radiation Oncology | Admitting: Radiation Oncology

## 2022-08-16 ENCOUNTER — Other Ambulatory Visit: Payer: Self-pay

## 2022-08-16 ENCOUNTER — Ambulatory Visit: Payer: Medicare (Managed Care) | Admitting: Cardiovascular Disease

## 2022-08-16 DIAGNOSIS — C61 Malignant neoplasm of prostate: Secondary | ICD-10-CM | POA: Diagnosis not present

## 2022-08-16 LAB — RAD ONC ARIA SESSION SUMMARY
Course Elapsed Days: 4
Plan Fractions Treated to Date: 3
Plan Prescribed Dose Per Fraction: 2 Gy
Plan Total Fractions Prescribed: 40
Plan Total Prescribed Dose: 80 Gy
Reference Point Dosage Given to Date: 6 Gy
Reference Point Session Dosage Given: 2 Gy
Session Number: 3

## 2022-08-17 ENCOUNTER — Other Ambulatory Visit: Payer: Self-pay

## 2022-08-17 ENCOUNTER — Ambulatory Visit
Admission: RE | Admit: 2022-08-17 | Discharge: 2022-08-17 | Disposition: A | Discharge status: Home / Self Care | Payer: Medicare (Managed Care) | Source: Ambulatory Visit | Attending: Radiation Oncology | Admitting: Radiation Oncology

## 2022-08-17 DIAGNOSIS — I5022 Chronic systolic (congestive) heart failure: Secondary | ICD-10-CM | POA: Diagnosis not present

## 2022-08-17 DIAGNOSIS — I480 Paroxysmal atrial fibrillation: Secondary | ICD-10-CM | POA: Diagnosis not present

## 2022-08-17 DIAGNOSIS — C61 Malignant neoplasm of prostate: Secondary | ICD-10-CM | POA: Diagnosis not present

## 2022-08-17 LAB — RAD ONC ARIA SESSION SUMMARY
Course Elapsed Days: 5
Plan Fractions Treated to Date: 4
Plan Prescribed Dose Per Fraction: 2 Gy
Plan Total Fractions Prescribed: 40
Plan Total Prescribed Dose: 80 Gy
Reference Point Dosage Given to Date: 8 Gy
Reference Point Session Dosage Given: 2 Gy
Session Number: 4

## 2022-08-18 ENCOUNTER — Other Ambulatory Visit: Payer: Self-pay

## 2022-08-18 ENCOUNTER — Ambulatory Visit
Admission: RE | Admit: 2022-08-18 | Discharge: 2022-08-18 | Disposition: A | Discharge status: Home / Self Care | Payer: Medicare (Managed Care) | Source: Ambulatory Visit | Attending: Radiation Oncology | Admitting: Radiation Oncology

## 2022-08-18 ENCOUNTER — Inpatient Hospital Stay: Payer: Medicare (Managed Care) | Attending: Radiation Oncology

## 2022-08-18 DIAGNOSIS — Z510 Encounter for antineoplastic radiation therapy: Secondary | ICD-10-CM | POA: Diagnosis not present

## 2022-08-18 DIAGNOSIS — C61 Malignant neoplasm of prostate: Secondary | ICD-10-CM | POA: Diagnosis not present

## 2022-08-18 DIAGNOSIS — Z191 Hormone sensitive malignancy status: Secondary | ICD-10-CM | POA: Diagnosis not present

## 2022-08-18 LAB — RAD ONC ARIA SESSION SUMMARY
Course Elapsed Days: 6
Plan Fractions Treated to Date: 5
Plan Prescribed Dose Per Fraction: 2 Gy
Plan Total Fractions Prescribed: 40
Plan Total Prescribed Dose: 80 Gy
Reference Point Dosage Given to Date: 10 Gy
Reference Point Session Dosage Given: 2 Gy
Session Number: 5

## 2022-08-18 LAB — CBC (CANCER CENTER ONLY)
HCT: 45.4 % (ref 39.0–52.0)
Hemoglobin: 15.3 g/dL (ref 13.0–17.0)
MCH: 32.6 pg (ref 26.0–34.0)
MCHC: 33.7 g/dL (ref 30.0–36.0)
MCV: 96.6 fL (ref 80.0–100.0)
Platelet Count: 239 10*3/uL (ref 150–400)
RBC: 4.7 MIL/uL (ref 4.22–5.81)
RDW: 12.2 % (ref 11.5–15.5)
WBC Count: 6.2 10*3/uL (ref 4.0–10.5)
nRBC: 0 % (ref 0.0–0.2)

## 2022-08-19 ENCOUNTER — Ambulatory Visit
Admission: RE | Admit: 2022-08-19 | Discharge: 2022-08-19 | Disposition: A | Discharge status: Home / Self Care | Payer: Medicare (Managed Care) | Source: Ambulatory Visit | Attending: Radiation Oncology | Admitting: Radiation Oncology

## 2022-08-19 ENCOUNTER — Other Ambulatory Visit: Payer: Self-pay

## 2022-08-19 DIAGNOSIS — C61 Malignant neoplasm of prostate: Secondary | ICD-10-CM | POA: Diagnosis not present

## 2022-08-19 LAB — RAD ONC ARIA SESSION SUMMARY
Course Elapsed Days: 7
Plan Fractions Treated to Date: 6
Plan Prescribed Dose Per Fraction: 2 Gy
Plan Total Fractions Prescribed: 40
Plan Total Prescribed Dose: 80 Gy
Reference Point Dosage Given to Date: 12 Gy
Reference Point Session Dosage Given: 2 Gy
Session Number: 6

## 2022-08-20 ENCOUNTER — Other Ambulatory Visit: Payer: Self-pay

## 2022-08-20 ENCOUNTER — Ambulatory Visit
Admission: RE | Admit: 2022-08-20 | Discharge: 2022-08-20 | Disposition: A | Discharge status: Home / Self Care | Payer: Medicare (Managed Care) | Source: Ambulatory Visit | Attending: Radiation Oncology | Admitting: Radiation Oncology

## 2022-08-20 DIAGNOSIS — C61 Malignant neoplasm of prostate: Secondary | ICD-10-CM | POA: Diagnosis not present

## 2022-08-20 LAB — RAD ONC ARIA SESSION SUMMARY
Course Elapsed Days: 8
Plan Fractions Treated to Date: 7
Plan Prescribed Dose Per Fraction: 2 Gy
Plan Total Fractions Prescribed: 40
Plan Total Prescribed Dose: 80 Gy
Reference Point Dosage Given to Date: 14 Gy
Reference Point Session Dosage Given: 2 Gy
Session Number: 7

## 2022-08-23 ENCOUNTER — Other Ambulatory Visit: Payer: Self-pay

## 2022-08-23 ENCOUNTER — Ambulatory Visit
Admission: RE | Admit: 2022-08-23 | Discharge: 2022-08-23 | Disposition: A | Discharge status: Home / Self Care | Payer: Medicare (Managed Care) | Source: Ambulatory Visit | Attending: Radiation Oncology | Admitting: Radiation Oncology

## 2022-08-23 DIAGNOSIS — C61 Malignant neoplasm of prostate: Secondary | ICD-10-CM | POA: Diagnosis not present

## 2022-08-23 LAB — RAD ONC ARIA SESSION SUMMARY
Course Elapsed Days: 11
Plan Fractions Treated to Date: 8
Plan Prescribed Dose Per Fraction: 2 Gy
Plan Total Fractions Prescribed: 40
Plan Total Prescribed Dose: 80 Gy
Reference Point Dosage Given to Date: 16 Gy
Reference Point Session Dosage Given: 2 Gy
Session Number: 8

## 2022-08-24 ENCOUNTER — Ambulatory Visit
Admission: RE | Admit: 2022-08-24 | Discharge: 2022-08-24 | Disposition: A | Discharge status: Home / Self Care | Payer: Medicare (Managed Care) | Source: Ambulatory Visit | Attending: Radiation Oncology | Admitting: Radiation Oncology

## 2022-08-24 ENCOUNTER — Other Ambulatory Visit: Payer: Self-pay

## 2022-08-24 DIAGNOSIS — C61 Malignant neoplasm of prostate: Secondary | ICD-10-CM | POA: Diagnosis not present

## 2022-08-24 LAB — RAD ONC ARIA SESSION SUMMARY
Course Elapsed Days: 12
Plan Fractions Treated to Date: 9
Plan Prescribed Dose Per Fraction: 2 Gy
Plan Total Fractions Prescribed: 40
Plan Total Prescribed Dose: 80 Gy
Reference Point Dosage Given to Date: 18 Gy
Reference Point Session Dosage Given: 2 Gy
Session Number: 9

## 2022-08-25 ENCOUNTER — Ambulatory Visit
Admission: RE | Admit: 2022-08-25 | Discharge: 2022-08-25 | Disposition: A | Discharge status: Home / Self Care | Payer: Medicare (Managed Care) | Source: Ambulatory Visit | Attending: Radiation Oncology | Admitting: Radiation Oncology

## 2022-08-25 ENCOUNTER — Other Ambulatory Visit: Payer: Self-pay

## 2022-08-25 DIAGNOSIS — C61 Malignant neoplasm of prostate: Secondary | ICD-10-CM | POA: Diagnosis not present

## 2022-08-25 DIAGNOSIS — Z191 Hormone sensitive malignancy status: Secondary | ICD-10-CM | POA: Diagnosis not present

## 2022-08-25 DIAGNOSIS — Z510 Encounter for antineoplastic radiation therapy: Secondary | ICD-10-CM | POA: Diagnosis not present

## 2022-08-25 LAB — RAD ONC ARIA SESSION SUMMARY
Course Elapsed Days: 13
Plan Fractions Treated to Date: 10
Plan Prescribed Dose Per Fraction: 2 Gy
Plan Total Fractions Prescribed: 40
Plan Total Prescribed Dose: 80 Gy
Reference Point Dosage Given to Date: 20 Gy
Reference Point Session Dosage Given: 2 Gy
Session Number: 10

## 2022-08-26 ENCOUNTER — Other Ambulatory Visit: Payer: Self-pay

## 2022-08-26 ENCOUNTER — Ambulatory Visit
Admission: RE | Admit: 2022-08-26 | Discharge: 2022-08-26 | Disposition: A | Discharge status: Home / Self Care | Payer: Medicare (Managed Care) | Source: Ambulatory Visit | Attending: Radiation Oncology | Admitting: Radiation Oncology

## 2022-08-26 DIAGNOSIS — C61 Malignant neoplasm of prostate: Secondary | ICD-10-CM | POA: Diagnosis not present

## 2022-08-26 LAB — RAD ONC ARIA SESSION SUMMARY
Course Elapsed Days: 14
Plan Fractions Treated to Date: 11
Plan Prescribed Dose Per Fraction: 2 Gy
Plan Total Fractions Prescribed: 40
Plan Total Prescribed Dose: 80 Gy
Reference Point Dosage Given to Date: 22 Gy
Reference Point Session Dosage Given: 2 Gy
Session Number: 11

## 2022-08-27 ENCOUNTER — Ambulatory Visit
Admission: RE | Admit: 2022-08-27 | Discharge: 2022-08-27 | Disposition: A | Discharge status: Home / Self Care | Payer: Medicare (Managed Care) | Source: Ambulatory Visit | Attending: Radiation Oncology | Admitting: Radiation Oncology

## 2022-08-27 ENCOUNTER — Other Ambulatory Visit: Payer: Self-pay

## 2022-08-27 DIAGNOSIS — C61 Malignant neoplasm of prostate: Secondary | ICD-10-CM | POA: Diagnosis not present

## 2022-08-27 LAB — RAD ONC ARIA SESSION SUMMARY
Course Elapsed Days: 15
Plan Fractions Treated to Date: 12
Plan Prescribed Dose Per Fraction: 2 Gy
Plan Total Fractions Prescribed: 40
Plan Total Prescribed Dose: 80 Gy
Reference Point Dosage Given to Date: 24 Gy
Reference Point Session Dosage Given: 2 Gy
Session Number: 12

## 2022-08-31 ENCOUNTER — Other Ambulatory Visit: Payer: Self-pay

## 2022-08-31 ENCOUNTER — Ambulatory Visit
Admission: RE | Admit: 2022-08-31 | Discharge: 2022-08-31 | Disposition: A | Discharge status: Home / Self Care | Payer: Medicare (Managed Care) | Source: Ambulatory Visit | Attending: Radiation Oncology | Admitting: Radiation Oncology

## 2022-08-31 DIAGNOSIS — C61 Malignant neoplasm of prostate: Secondary | ICD-10-CM | POA: Diagnosis not present

## 2022-08-31 LAB — RAD ONC ARIA SESSION SUMMARY
Course Elapsed Days: 19
Plan Fractions Treated to Date: 13
Plan Prescribed Dose Per Fraction: 2 Gy
Plan Total Fractions Prescribed: 40
Plan Total Prescribed Dose: 80 Gy
Reference Point Dosage Given to Date: 26 Gy
Reference Point Session Dosage Given: 2 Gy
Session Number: 13

## 2022-09-01 ENCOUNTER — Inpatient Hospital Stay: Payer: Medicare (Managed Care)

## 2022-09-01 ENCOUNTER — Other Ambulatory Visit: Payer: Self-pay

## 2022-09-01 ENCOUNTER — Ambulatory Visit
Admission: RE | Admit: 2022-09-01 | Discharge: 2022-09-01 | Disposition: A | Discharge status: Home / Self Care | Payer: Medicare (Managed Care) | Source: Ambulatory Visit | Attending: Radiation Oncology | Admitting: Radiation Oncology

## 2022-09-01 DIAGNOSIS — C61 Malignant neoplasm of prostate: Secondary | ICD-10-CM | POA: Diagnosis not present

## 2022-09-01 LAB — RAD ONC ARIA SESSION SUMMARY
Course Elapsed Days: 20
Plan Fractions Treated to Date: 14
Plan Prescribed Dose Per Fraction: 2 Gy
Plan Total Fractions Prescribed: 40
Plan Total Prescribed Dose: 80 Gy
Reference Point Dosage Given to Date: 28 Gy
Reference Point Session Dosage Given: 2 Gy
Session Number: 14

## 2022-09-02 ENCOUNTER — Other Ambulatory Visit: Payer: Self-pay | Admitting: *Deleted

## 2022-09-02 ENCOUNTER — Ambulatory Visit
Admission: RE | Admit: 2022-09-02 | Discharge: 2022-09-02 | Disposition: A | Discharge status: Home / Self Care | Payer: Medicare (Managed Care) | Source: Ambulatory Visit | Attending: Radiation Oncology | Admitting: Radiation Oncology

## 2022-09-02 ENCOUNTER — Other Ambulatory Visit: Payer: Self-pay

## 2022-09-02 DIAGNOSIS — Z191 Hormone sensitive malignancy status: Secondary | ICD-10-CM | POA: Diagnosis not present

## 2022-09-02 DIAGNOSIS — Z510 Encounter for antineoplastic radiation therapy: Secondary | ICD-10-CM | POA: Diagnosis not present

## 2022-09-02 DIAGNOSIS — C61 Malignant neoplasm of prostate: Secondary | ICD-10-CM | POA: Diagnosis not present

## 2022-09-02 LAB — RAD ONC ARIA SESSION SUMMARY
Course Elapsed Days: 21
Plan Fractions Treated to Date: 15
Plan Prescribed Dose Per Fraction: 2 Gy
Plan Total Fractions Prescribed: 40
Plan Total Prescribed Dose: 80 Gy
Reference Point Dosage Given to Date: 30 Gy
Reference Point Session Dosage Given: 2 Gy
Session Number: 15

## 2022-09-03 ENCOUNTER — Ambulatory Visit
Admission: RE | Admit: 2022-09-03 | Discharge: 2022-09-03 | Disposition: A | Discharge status: Home / Self Care | Payer: Medicare (Managed Care) | Source: Ambulatory Visit | Attending: Radiation Oncology | Admitting: Radiation Oncology

## 2022-09-03 ENCOUNTER — Other Ambulatory Visit: Payer: Self-pay

## 2022-09-03 DIAGNOSIS — C61 Malignant neoplasm of prostate: Secondary | ICD-10-CM | POA: Diagnosis not present

## 2022-09-03 LAB — RAD ONC ARIA SESSION SUMMARY
Course Elapsed Days: 22
Plan Fractions Treated to Date: 16
Plan Prescribed Dose Per Fraction: 2 Gy
Plan Total Fractions Prescribed: 40
Plan Total Prescribed Dose: 80 Gy
Reference Point Dosage Given to Date: 32 Gy
Reference Point Session Dosage Given: 2 Gy
Session Number: 16

## 2022-09-06 ENCOUNTER — Ambulatory Visit
Admission: RE | Admit: 2022-09-06 | Discharge: 2022-09-06 | Disposition: A | Discharge status: Home / Self Care | Payer: Medicare (Managed Care) | Source: Ambulatory Visit | Attending: Radiation Oncology | Admitting: Radiation Oncology

## 2022-09-06 ENCOUNTER — Other Ambulatory Visit: Payer: Self-pay

## 2022-09-06 DIAGNOSIS — Z8349 Family history of other endocrine, nutritional and metabolic diseases: Secondary | ICD-10-CM | POA: Diagnosis not present

## 2022-09-06 DIAGNOSIS — Z833 Family history of diabetes mellitus: Secondary | ICD-10-CM | POA: Insufficient documentation

## 2022-09-06 DIAGNOSIS — C61 Malignant neoplasm of prostate: Secondary | ICD-10-CM | POA: Insufficient documentation

## 2022-09-06 DIAGNOSIS — E785 Hyperlipidemia, unspecified: Secondary | ICD-10-CM | POA: Insufficient documentation

## 2022-09-06 DIAGNOSIS — Z79899 Other long term (current) drug therapy: Secondary | ICD-10-CM | POA: Insufficient documentation

## 2022-09-06 DIAGNOSIS — Z7901 Long term (current) use of anticoagulants: Secondary | ICD-10-CM | POA: Diagnosis not present

## 2022-09-06 DIAGNOSIS — E039 Hypothyroidism, unspecified: Secondary | ICD-10-CM | POA: Insufficient documentation

## 2022-09-06 DIAGNOSIS — I509 Heart failure, unspecified: Secondary | ICD-10-CM | POA: Diagnosis not present

## 2022-09-06 DIAGNOSIS — Z832 Family history of diseases of the blood and blood-forming organs and certain disorders involving the immune mechanism: Secondary | ICD-10-CM | POA: Insufficient documentation

## 2022-09-06 DIAGNOSIS — Z825 Family history of asthma and other chronic lower respiratory diseases: Secondary | ICD-10-CM | POA: Diagnosis not present

## 2022-09-06 DIAGNOSIS — I110 Hypertensive heart disease with heart failure: Secondary | ICD-10-CM | POA: Diagnosis not present

## 2022-09-06 DIAGNOSIS — F17210 Nicotine dependence, cigarettes, uncomplicated: Secondary | ICD-10-CM | POA: Diagnosis not present

## 2022-09-06 DIAGNOSIS — N400 Enlarged prostate without lower urinary tract symptoms: Secondary | ICD-10-CM | POA: Insufficient documentation

## 2022-09-06 DIAGNOSIS — I4891 Unspecified atrial fibrillation: Secondary | ICD-10-CM | POA: Insufficient documentation

## 2022-09-06 DIAGNOSIS — F1290 Cannabis use, unspecified, uncomplicated: Secondary | ICD-10-CM | POA: Insufficient documentation

## 2022-09-06 DIAGNOSIS — Z8249 Family history of ischemic heart disease and other diseases of the circulatory system: Secondary | ICD-10-CM | POA: Insufficient documentation

## 2022-09-06 LAB — RAD ONC ARIA SESSION SUMMARY
Course Elapsed Days: 25
Plan Fractions Treated to Date: 17
Plan Prescribed Dose Per Fraction: 2 Gy
Plan Total Fractions Prescribed: 40
Plan Total Prescribed Dose: 80 Gy
Reference Point Dosage Given to Date: 34 Gy
Reference Point Session Dosage Given: 2 Gy
Session Number: 17

## 2022-09-07 ENCOUNTER — Other Ambulatory Visit: Payer: Self-pay

## 2022-09-07 ENCOUNTER — Ambulatory Visit
Admission: RE | Admit: 2022-09-07 | Discharge: 2022-09-07 | Disposition: A | Discharge status: Home / Self Care | Payer: Medicare (Managed Care) | Source: Ambulatory Visit | Attending: Radiation Oncology | Admitting: Radiation Oncology

## 2022-09-07 DIAGNOSIS — C61 Malignant neoplasm of prostate: Secondary | ICD-10-CM | POA: Diagnosis not present

## 2022-09-07 LAB — RAD ONC ARIA SESSION SUMMARY
Course Elapsed Days: 26
Plan Fractions Treated to Date: 18
Plan Prescribed Dose Per Fraction: 2 Gy
Plan Total Fractions Prescribed: 40
Plan Total Prescribed Dose: 80 Gy
Reference Point Dosage Given to Date: 36 Gy
Reference Point Session Dosage Given: 2 Gy
Session Number: 18

## 2022-09-08 ENCOUNTER — Other Ambulatory Visit: Payer: Self-pay

## 2022-09-08 ENCOUNTER — Ambulatory Visit
Admission: RE | Admit: 2022-09-08 | Discharge: 2022-09-08 | Disposition: A | Discharge status: Home / Self Care | Payer: Medicare (Managed Care) | Source: Ambulatory Visit | Attending: Radiation Oncology | Admitting: Radiation Oncology

## 2022-09-08 DIAGNOSIS — C61 Malignant neoplasm of prostate: Secondary | ICD-10-CM | POA: Diagnosis not present

## 2022-09-08 LAB — RAD ONC ARIA SESSION SUMMARY
Course Elapsed Days: 27
Plan Fractions Treated to Date: 19
Plan Prescribed Dose Per Fraction: 2 Gy
Plan Total Fractions Prescribed: 40
Plan Total Prescribed Dose: 80 Gy
Reference Point Dosage Given to Date: 38 Gy
Reference Point Session Dosage Given: 2 Gy
Session Number: 19

## 2022-09-09 ENCOUNTER — Ambulatory Visit
Admission: RE | Admit: 2022-09-09 | Discharge: 2022-09-09 | Disposition: A | Discharge status: Home / Self Care | Payer: Medicare (Managed Care) | Source: Ambulatory Visit | Attending: Radiation Oncology | Admitting: Radiation Oncology

## 2022-09-09 ENCOUNTER — Other Ambulatory Visit: Payer: Self-pay

## 2022-09-09 DIAGNOSIS — Z191 Hormone sensitive malignancy status: Secondary | ICD-10-CM | POA: Diagnosis not present

## 2022-09-09 DIAGNOSIS — Z510 Encounter for antineoplastic radiation therapy: Secondary | ICD-10-CM | POA: Diagnosis not present

## 2022-09-09 DIAGNOSIS — C61 Malignant neoplasm of prostate: Secondary | ICD-10-CM | POA: Diagnosis not present

## 2022-09-09 LAB — RAD ONC ARIA SESSION SUMMARY
Course Elapsed Days: 28
Plan Fractions Treated to Date: 20
Plan Prescribed Dose Per Fraction: 2 Gy
Plan Total Fractions Prescribed: 40
Plan Total Prescribed Dose: 80 Gy
Reference Point Dosage Given to Date: 40 Gy
Reference Point Session Dosage Given: 2 Gy
Session Number: 20

## 2022-09-10 ENCOUNTER — Inpatient Hospital Stay (HOSPITAL_BASED_OUTPATIENT_CLINIC_OR_DEPARTMENT_OTHER): Payer: Medicare (Managed Care) | Admitting: Medical Oncology

## 2022-09-10 ENCOUNTER — Inpatient Hospital Stay: Payer: Medicare (Managed Care) | Attending: Radiation Oncology

## 2022-09-10 ENCOUNTER — Other Ambulatory Visit: Payer: Self-pay

## 2022-09-10 ENCOUNTER — Ambulatory Visit
Admission: RE | Admit: 2022-09-10 | Discharge: 2022-09-10 | Disposition: A | Discharge status: Home / Self Care | Payer: Medicare (Managed Care) | Source: Ambulatory Visit | Attending: Radiation Oncology | Admitting: Radiation Oncology

## 2022-09-10 ENCOUNTER — Encounter: Payer: Self-pay | Admitting: Medical Oncology

## 2022-09-10 VITALS — BP 114/73 | HR 67 | Temp 97.0°F | Wt 151.0 lb

## 2022-09-10 DIAGNOSIS — E039 Hypothyroidism, unspecified: Secondary | ICD-10-CM | POA: Insufficient documentation

## 2022-09-10 DIAGNOSIS — E785 Hyperlipidemia, unspecified: Secondary | ICD-10-CM | POA: Diagnosis not present

## 2022-09-10 DIAGNOSIS — N3000 Acute cystitis without hematuria: Secondary | ICD-10-CM | POA: Insufficient documentation

## 2022-09-10 DIAGNOSIS — C61 Malignant neoplasm of prostate: Secondary | ICD-10-CM | POA: Diagnosis not present

## 2022-09-10 DIAGNOSIS — R300 Dysuria: Secondary | ICD-10-CM

## 2022-09-10 DIAGNOSIS — Z79899 Other long term (current) drug therapy: Secondary | ICD-10-CM | POA: Diagnosis not present

## 2022-09-10 DIAGNOSIS — I509 Heart failure, unspecified: Secondary | ICD-10-CM | POA: Diagnosis not present

## 2022-09-10 DIAGNOSIS — N401 Enlarged prostate with lower urinary tract symptoms: Secondary | ICD-10-CM | POA: Insufficient documentation

## 2022-09-10 DIAGNOSIS — Z7901 Long term (current) use of anticoagulants: Secondary | ICD-10-CM | POA: Insufficient documentation

## 2022-09-10 DIAGNOSIS — R531 Weakness: Secondary | ICD-10-CM | POA: Insufficient documentation

## 2022-09-10 LAB — COMPREHENSIVE METABOLIC PANEL
ALT: 16 U/L (ref 0–44)
AST: 18 U/L (ref 15–41)
Albumin: 3.9 g/dL (ref 3.5–5.0)
Alkaline Phosphatase: 67 U/L (ref 38–126)
Anion gap: 5 (ref 5–15)
BUN: 17 mg/dL (ref 8–23)
CO2: 27 mmol/L (ref 22–32)
Calcium: 9.3 mg/dL (ref 8.9–10.3)
Chloride: 104 mmol/L (ref 98–111)
Creatinine, Ser: 1.1 mg/dL (ref 0.61–1.24)
GFR, Estimated: >60 mL/min (ref >60)
Glucose, Bld: 96 mg/dL (ref 70–99)
Potassium: 4 mmol/L (ref 3.5–5.1)
Sodium: 136 mmol/L (ref 135–145)
Total Bilirubin: 0.6 mg/dL (ref 0.3–1.2)
Total Protein: 7.6 g/dL (ref 6.5–8.1)

## 2022-09-10 LAB — URINALYSIS, COMPLETE (UACMP) WITH MICROSCOPIC
Bilirubin Urine: NEGATIVE
Glucose, UA: 150 mg/dL — AB
Hgb urine dipstick: NEGATIVE
Ketones, ur: NEGATIVE mg/dL
Nitrite: NEGATIVE
Protein, ur: >=300 mg/dL — AB
Specific Gravity, Urine: 1.022 (ref 1.005–1.030)
WBC, UA: >50 WBC/hpf (ref 0–5)
pH: 6 (ref 5.0–8.0)

## 2022-09-10 LAB — CBC WITH DIFFERENTIAL/PLATELET
Abs Immature Granulocytes: 0.02 10*3/uL (ref 0.00–0.07)
Basophils Absolute: 0 10*3/uL (ref 0.0–0.1)
Basophils Relative: 0 %
Eosinophils Absolute: 0.8 10*3/uL — ABNORMAL HIGH (ref 0.0–0.5)
Eosinophils Relative: 12 %
HCT: 43.6 % (ref 39.0–52.0)
Hemoglobin: 14.7 g/dL (ref 13.0–17.0)
Immature Granulocytes: 0 %
Lymphocytes Relative: 8 %
Lymphs Abs: 0.5 10*3/uL — ABNORMAL LOW (ref 0.7–4.0)
MCH: 32.5 pg (ref 26.0–34.0)
MCHC: 33.7 g/dL (ref 30.0–36.0)
MCV: 96.5 fL (ref 80.0–100.0)
Monocytes Absolute: 0.4 10*3/uL (ref 0.1–1.0)
Monocytes Relative: 7 %
Neutro Abs: 4.9 10*3/uL (ref 1.7–7.7)
Neutrophils Relative %: 73 %
Platelets: 178 10*3/uL (ref 150–400)
RBC: 4.52 MIL/uL (ref 4.22–5.81)
RDW: 12.7 % (ref 11.5–15.5)
WBC: 6.8 10*3/uL (ref 4.0–10.5)
nRBC: 0 % (ref 0.0–0.2)

## 2022-09-10 LAB — RAD ONC ARIA SESSION SUMMARY
Course Elapsed Days: 29
Plan Fractions Treated to Date: 21
Plan Prescribed Dose Per Fraction: 2 Gy
Plan Total Fractions Prescribed: 40
Plan Total Prescribed Dose: 80 Gy
Reference Point Dosage Given to Date: 42 Gy
Reference Point Session Dosage Given: 2 Gy
Session Number: 21

## 2022-09-10 MED ORDER — NITROFURANTOIN MONOHYD MACRO 100 MG PO CAPS
100.0000 mg | ORAL_CAPSULE | Freq: Two times a day (BID) | ORAL | 0 refills | Status: AC
Start: 2022-09-10 — End: 2022-09-17

## 2022-09-10 MED ORDER — PHENAZOPYRIDINE HCL 200 MG PO TABS
200.0000 mg | ORAL_TABLET | Freq: Three times a day (TID) | ORAL | 0 refills | Status: DC | PRN
Start: 2022-09-10 — End: 2022-12-20

## 2022-09-10 NOTE — Progress Notes (Signed)
[facility]pt[facility] [phone] [phone]  Patient Care Team: Dorcas Carrow, DO as PCP - General (Family Medicine)   Name of the patient: Tyler Cohen  [id number]  [birth year]   Oncological History: Stage IIIa Gleason 3+3/3+/4+3 adenocarcinoma of the prostate   Current Treatment: XRT daily x 40 on 08/12/2022  Date of visit: 09/10/22  Reason for Consult: Tyler Cohen is a 67 y.o. male with prostate cancer who sis currently undergoing XRT who presents today for:  Dysuria: Patient states that for the past few weeks he has had dysuria symptoms.  Symptoms are stable in nature.  He also has noticed some mild urinary frequency and urgency episodes.  He has tried AZO for symptoms with moderate improvement.  He denies any fevers, vomiting, diarrhea, penile discharge, new sexual partners or significant abdominal discomfort.  No recent UTI found in chart.     PAST MEDICAL HISTORY: Past Medical History:  Diagnosis Date   Aortic atherosclerosis (HCC)    Arthritis    Atrial fibrillation (HCC)    a.) CHA2DS2VASc = 4 (age, CHF, HTN, vascular disease history);  b.) s/p DCCV (300 J x 1) 05/18/2021; c.) rate/rhythm maintained on oral digoxin; chronically anticoagulated with apixaban   BPH (benign prostatic hyperplasia)    CHF (congestive heart failure) (HCC)    a.) TTE 05/14/2021: EF 25-30%, glob HK, mod dil LV, RVE, PASP 39.8; b.) TTE 06/25/2021: EF 30%, LAE, triv AR/PR, mild TR, mod MR, G1DD   Elevated PSA    Heart murmur    Hepatic cyst    Hepatic hemangioma    HLD (hyperlipidemia)    HTN (hypertension)    Hypothyroidism    a.) thyrotoxicosis in 05/2021 (TSH < 0.010)   Long term current use of antiarrhythmic drug    a.) digoxin   Long term current use of anticoagulant    a.) apixaban   Macrocytosis    Multinodular goiter    NICM (nonischemic cardiomyopathy) (HCC)    a.) TTE 05/14/2021: EF 25-30%; b.)  TTE 06/25/2021: EF 30%   Right lower lobe pulmonary nodule 08/20/2021   a.) cCTA 08/20/2021: measured 7 x 5 mm.   Tobacco abuse     PAST SURGICAL HISTORY:  Past Surgical History:  Procedure Laterality Date   COLONOSCOPY WITH PROPOFOL N/A 12/01/2015   Procedure: COLONOSCOPY WITH PROPOFOL;  Surgeon: Midge Minium, MD;  Location: [facility];  Service: Endoscopy;  Laterality: N/A;   POLYPECTOMY  12/01/2015   Procedure: POLYPECTOMY;  Surgeon: Midge Minium, MD;  Location: [facility];  Service: Endoscopy;;   PROSTATE BIOPSY N/A 05/25/2022   Procedure: PROSTATE BIOPSY;  Surgeon: Riki Altes, MD;  Location: [facility];  Service: Urology;  Laterality: N/A;   TEE WITH CARDIOVERSION N/A 05/18/2021   TRANSRECTAL ULTRASOUND N/A 05/25/2022   Procedure: TRANSRECTAL ULTRASOUND;  Surgeon: Riki Altes, MD;  Location: [facility];  Service: Urology;  Laterality: N/A;    HEMATOLOGY/ONCOLOGY HISTORY:  Oncology History   No history exists.    ALLERGIES:  is allergic to lactose intolerance (gi).  MEDICATIONS:  Current Outpatient Medications  Medication Sig Dispense Refill   albuterol (VENTOLIN HFA) 108 (90 Base) MCG/ACT inhaler Inhale 2 puffs into the lungs every 6 (six) hours as needed for wheezing or shortness of breath. 1 each 12   atorvastatin (LIPITOR) 20 MG tablet TAKE 1 TABLET BY MOUTH AT BEDTIME 90 tablet 2   digoxin (LANOXIN) 0.125 MG tablet Take  1 tablet (125 mcg total) by mouth daily. 90 tablet 1   ELIQUIS 5 MG TABS tablet Take 1 tablet (5 mg total) by mouth 2 (two) times daily. 180 tablet 1   empagliflozin (JARDIANCE) 10 MG TABS tablet Take 1 tablet (10 mg total) by mouth daily. 90 tablet 1   furosemide (LASIX) 40 MG tablet Take 1 tablet (40 mg total) by mouth daily. 90 tablet 1   losartan (COZAAR) 25 MG tablet Take 25 mg by mouth daily.     methimazole (TAPAZOLE) 10 MG tablet Take 1 tablet (10 mg total) by mouth daily. 90 tablet 1   nitrofurantoin,  macrocrystal-monohydrate, (MACROBID) 100 MG capsule Take 1 capsule (100 mg total) by mouth 2 (two) times daily for 7 days. 14 capsule 0   phenazopyridine (PYRIDIUM) 200 MG tablet Take 1 tablet (200 mg total) by mouth 3 (three) times daily as needed for pain. 10 tablet 0   spironolactone (ALDACTONE) 25 MG tablet Take 0.5 tablets (12.5 mg total) by mouth daily. 45 tablet 1   tamsulosin (FLOMAX) 0.4 MG CAPS capsule TAKE 1 CAPSULE BY MOUTH ONCE DAILY 30 MINUTES AFTER LARGEST MEAL. 30 capsule 1   No current facility-administered medications for this visit.    VITAL SIGNS: BP 114/73 (BP Location: Left Arm, Patient Position: Sitting)   Pulse 67   Temp (!) 97 F (36.1 C) (Tympanic)   Wt 151 lb (68.5 kg)   BMI 19.92 kg/m  Filed Weights   09/10/22 1020  Weight: 151 lb (68.5 kg)    Estimated body mass index is 19.92 kg/m as calculated from the following:   Height as of 07/30/22: 6\' 1"  (1.854 m).   Weight as of this encounter: 151 lb (68.5 kg).  LABS: CBC:    Component Value Date/Time   WBC 6.8 09/10/2022 0954   HGB 14.7 09/10/2022 0954   HGB 15.3 08/18/2022 0847   HGB 15.9 03/25/2022 0909   HCT 43.6 09/10/2022 0954   HCT 48.6 03/25/2022 0909   PLT 178 09/10/2022 0954   PLT 239 08/18/2022 0847   PLT 166 03/25/2022 0909   MCV 96.5 09/10/2022 0954   MCV 99 (H) 03/25/2022 0909   NEUTROABS 4.9 09/10/2022 0954   NEUTROABS 2.5 03/25/2022 0909   LYMPHSABS 0.5 (L) 09/10/2022 0954   LYMPHSABS 2.3 03/25/2022 0909   MONOABS 0.4 09/10/2022 0954   EOSABS 0.8 (H) 09/10/2022 0954   EOSABS 0.2 03/25/2022 0909   BASOSABS 0.0 09/10/2022 0954   BASOSABS 0.0 03/25/2022 0909   Comprehensive Metabolic Panel:    Component Value Date/Time   NA 136 09/10/2022 0954   NA 138 04/22/2022 0921   K 4.0 09/10/2022 0954   CL 104 09/10/2022 0954   CO2 27 09/10/2022 0954   BUN 17 09/10/2022 0954   BUN 15 04/22/2022 0921   CREATININE 1.10 09/10/2022 0954   GLUCOSE 96 09/10/2022 0954   CALCIUM PENDING  09/10/2022 0954   AST 18 09/10/2022 0954   ALT 16 09/10/2022 0954   ALKPHOS 67 09/10/2022 0954   BILITOT 0.6 09/10/2022 0954   BILITOT 0.6 04/22/2022 0921   PROT 7.6 09/10/2022 0954   PROT 6.9 04/22/2022 0921   ALBUMIN 3.9 09/10/2022 0954   ALBUMIN 4.2 04/22/2022 0921    RADIOGRAPHIC STUDIES: No results found.  PERFORMANCE STATUS (ECOG) : 1 - Symptomatic but completely ambulatory  Review of Systems Unless otherwise noted, a complete review of systems is negative.  Physical Exam General: NAD Cardiovascular: regular rate and rhythm Pulmonary:  clear ant fields Abdomen: soft, nontender, + bowel sounds, no CVA tenderness GU: no suprapubic tenderness Extremities: no edema, no joint deformities Skin: no rashes Neurological: Weakness but otherwise nonfocal  Assessment and Plan- Patient is a 67 y.o. male    Encounter Diagnoses  Name Primary?   Acute cystitis without hematuria Yes   Prostate cancer (HCC)     New to me.  Review of his urinalysis shows moderate leukocytes and few bacteria.  His vital signs are reassuring.  His CMP is reassuring as well.  Question if this is radiation cystitis versus UTI versus combination of the two.  For now we will treat with Pyridium and Macrobid.  Reviewed how to take along with common potential side effects.  If his urine culture is negative for abnormal bacterial growth I would suggest he hold XRT at the discrepancy of Dr. Aggie Cosier until symptoms subside.  He may benefit from a low-dose oral steroid course.    Patient expressed understanding and was in agreement with this plan. He also understands that He can call clinic at any time with any questions, concerns, or complaints.   Thank you for allowing me to participate in the care of this very pleasant patient.   Time Total: 25  Visit consisted of counseling and education dealing with the complex and emotionally intense issues of symptom management in the setting of serious illness.Greater  than 50%  of this time was spent counseling and coordinating care related to the above assessment and plan.  Signed by: Clent Jacks, PA-C

## 2022-09-11 LAB — URINE CULTURE

## 2022-09-12 LAB — URINE CULTURE: Culture: 10000 — AB

## 2022-09-13 ENCOUNTER — Ambulatory Visit
Admission: RE | Admit: 2022-09-13 | Discharge: 2022-09-13 | Disposition: A | Discharge status: Home / Self Care | Payer: Medicare (Managed Care) | Source: Ambulatory Visit | Attending: Radiation Oncology | Admitting: Radiation Oncology

## 2022-09-13 ENCOUNTER — Other Ambulatory Visit: Payer: Self-pay

## 2022-09-13 DIAGNOSIS — C61 Malignant neoplasm of prostate: Secondary | ICD-10-CM | POA: Diagnosis not present

## 2022-09-13 LAB — RAD ONC ARIA SESSION SUMMARY
Course Elapsed Days: 32
Plan Fractions Treated to Date: 22
Plan Prescribed Dose Per Fraction: 2 Gy
Plan Total Fractions Prescribed: 40
Plan Total Prescribed Dose: 80 Gy
Reference Point Dosage Given to Date: 44 Gy
Reference Point Session Dosage Given: 2 Gy
Session Number: 22

## 2022-09-14 ENCOUNTER — Telehealth: Payer: Self-pay

## 2022-09-14 ENCOUNTER — Ambulatory Visit
Admission: RE | Admit: 2022-09-14 | Discharge: 2022-09-14 | Disposition: A | Discharge status: Home / Self Care | Payer: Medicare (Managed Care) | Source: Ambulatory Visit | Attending: Radiation Oncology | Admitting: Radiation Oncology

## 2022-09-14 ENCOUNTER — Other Ambulatory Visit: Payer: Self-pay

## 2022-09-14 DIAGNOSIS — C61 Malignant neoplasm of prostate: Secondary | ICD-10-CM | POA: Diagnosis not present

## 2022-09-14 LAB — RAD ONC ARIA SESSION SUMMARY
Course Elapsed Days: 33
Plan Fractions Treated to Date: 23
Plan Prescribed Dose Per Fraction: 2 Gy
Plan Total Fractions Prescribed: 40
Plan Total Prescribed Dose: 80 Gy
Reference Point Dosage Given to Date: 46 Gy
Reference Point Session Dosage Given: 2 Gy
Session Number: 23

## 2022-09-14 NOTE — Telephone Encounter (Signed)
-----   Message from Rushie Chestnut, [place] sent at 09/14/2022  3:06 PM EDT ----- Can you let him know that he did have a UTI and that the medication we gave him should clear the infection.

## 2022-09-14 NOTE — Telephone Encounter (Signed)
Called and informed patient of urine culture results. Pt verbalized understanding and denies any questions or concerns at this time.

## 2022-09-15 ENCOUNTER — Other Ambulatory Visit: Payer: Self-pay

## 2022-09-15 ENCOUNTER — Inpatient Hospital Stay: Payer: Medicare (Managed Care)

## 2022-09-15 ENCOUNTER — Ambulatory Visit
Admission: RE | Admit: 2022-09-15 | Discharge: 2022-09-15 | Disposition: A | Discharge status: Home / Self Care | Payer: Medicare (Managed Care) | Source: Ambulatory Visit | Attending: Radiation Oncology | Admitting: Radiation Oncology

## 2022-09-15 DIAGNOSIS — C61 Malignant neoplasm of prostate: Secondary | ICD-10-CM | POA: Diagnosis not present

## 2022-09-15 LAB — RAD ONC ARIA SESSION SUMMARY
Course Elapsed Days: 34
Plan Fractions Treated to Date: 24
Plan Prescribed Dose Per Fraction: 2 Gy
Plan Total Fractions Prescribed: 40
Plan Total Prescribed Dose: 80 Gy
Reference Point Dosage Given to Date: 48 Gy
Reference Point Session Dosage Given: 2 Gy
Session Number: 24

## 2022-09-16 ENCOUNTER — Ambulatory Visit
Admission: RE | Admit: 2022-09-16 | Discharge: 2022-09-16 | Disposition: A | Discharge status: Home / Self Care | Payer: Medicare (Managed Care) | Source: Ambulatory Visit | Attending: Radiation Oncology | Admitting: Radiation Oncology

## 2022-09-16 ENCOUNTER — Other Ambulatory Visit: Payer: Self-pay

## 2022-09-16 DIAGNOSIS — Z510 Encounter for antineoplastic radiation therapy: Secondary | ICD-10-CM | POA: Diagnosis not present

## 2022-09-16 DIAGNOSIS — C61 Malignant neoplasm of prostate: Secondary | ICD-10-CM | POA: Diagnosis not present

## 2022-09-16 DIAGNOSIS — Z191 Hormone sensitive malignancy status: Secondary | ICD-10-CM | POA: Diagnosis not present

## 2022-09-16 LAB — RAD ONC ARIA SESSION SUMMARY
Course Elapsed Days: 35
Plan Fractions Treated to Date: 25
Plan Prescribed Dose Per Fraction: 2 Gy
Plan Total Fractions Prescribed: 40
Plan Total Prescribed Dose: 80 Gy
Reference Point Dosage Given to Date: 50 Gy
Reference Point Session Dosage Given: 2 Gy
Session Number: 25

## 2022-09-17 ENCOUNTER — Other Ambulatory Visit: Payer: Self-pay

## 2022-09-17 ENCOUNTER — Ambulatory Visit
Admission: RE | Admit: 2022-09-17 | Discharge: 2022-09-17 | Disposition: A | Discharge status: Home / Self Care | Payer: Medicare (Managed Care) | Source: Ambulatory Visit | Attending: Radiation Oncology | Admitting: Radiation Oncology

## 2022-09-17 DIAGNOSIS — C61 Malignant neoplasm of prostate: Secondary | ICD-10-CM | POA: Diagnosis not present

## 2022-09-17 LAB — RAD ONC ARIA SESSION SUMMARY
Course Elapsed Days: 36
Plan Fractions Treated to Date: 26
Plan Prescribed Dose Per Fraction: 2 Gy
Plan Total Fractions Prescribed: 40
Plan Total Prescribed Dose: 80 Gy
Reference Point Dosage Given to Date: 52 Gy
Reference Point Session Dosage Given: 2 Gy
Session Number: 26

## 2022-09-20 ENCOUNTER — Other Ambulatory Visit: Payer: Self-pay

## 2022-09-20 ENCOUNTER — Ambulatory Visit
Admission: RE | Admit: 2022-09-20 | Discharge: 2022-09-20 | Disposition: A | Discharge status: Home / Self Care | Payer: Medicare (Managed Care) | Source: Ambulatory Visit | Attending: Radiation Oncology | Admitting: Radiation Oncology

## 2022-09-20 DIAGNOSIS — C61 Malignant neoplasm of prostate: Secondary | ICD-10-CM | POA: Diagnosis not present

## 2022-09-20 LAB — RAD ONC ARIA SESSION SUMMARY
Course Elapsed Days: 39
Plan Fractions Treated to Date: 27
Plan Prescribed Dose Per Fraction: 2 Gy
Plan Total Fractions Prescribed: 40
Plan Total Prescribed Dose: 80 Gy
Reference Point Dosage Given to Date: 54 Gy
Reference Point Session Dosage Given: 2 Gy
Session Number: 27

## 2022-09-21 ENCOUNTER — Ambulatory Visit: Payer: Medicare (Managed Care)

## 2022-09-22 ENCOUNTER — Ambulatory Visit
Admission: RE | Admit: 2022-09-22 | Discharge: 2022-09-22 | Disposition: A | Discharge status: Home / Self Care | Payer: Medicare (Managed Care) | Source: Ambulatory Visit | Attending: Radiation Oncology | Admitting: Radiation Oncology

## 2022-09-22 ENCOUNTER — Other Ambulatory Visit: Payer: Self-pay

## 2022-09-22 DIAGNOSIS — Z8679 Personal history of other diseases of the circulatory system: Secondary | ICD-10-CM | POA: Diagnosis not present

## 2022-09-22 DIAGNOSIS — I429 Cardiomyopathy, unspecified: Secondary | ICD-10-CM | POA: Diagnosis not present

## 2022-09-22 DIAGNOSIS — I5022 Chronic systolic (congestive) heart failure: Secondary | ICD-10-CM | POA: Diagnosis not present

## 2022-09-22 DIAGNOSIS — C61 Malignant neoplasm of prostate: Secondary | ICD-10-CM | POA: Diagnosis not present

## 2022-09-22 DIAGNOSIS — E0521 Thyrotoxicosis with toxic multinodular goiter with thyrotoxic crisis or storm: Secondary | ICD-10-CM | POA: Diagnosis not present

## 2022-09-22 DIAGNOSIS — I480 Paroxysmal atrial fibrillation: Secondary | ICD-10-CM | POA: Diagnosis not present

## 2022-09-22 DIAGNOSIS — I700 Atherosclerosis of aorta: Secondary | ICD-10-CM | POA: Diagnosis not present

## 2022-09-22 DIAGNOSIS — Z720 Tobacco use: Secondary | ICD-10-CM | POA: Diagnosis not present

## 2022-09-22 DIAGNOSIS — E785 Hyperlipidemia, unspecified: Secondary | ICD-10-CM | POA: Diagnosis not present

## 2022-09-22 LAB — RAD ONC ARIA SESSION SUMMARY
Course Elapsed Days: 41
Plan Fractions Treated to Date: 28
Plan Prescribed Dose Per Fraction: 2 Gy
Plan Total Fractions Prescribed: 40
Plan Total Prescribed Dose: 80 Gy
Reference Point Dosage Given to Date: 56 Gy
Reference Point Session Dosage Given: 2 Gy
Session Number: 28

## 2022-09-23 ENCOUNTER — Other Ambulatory Visit: Payer: Self-pay

## 2022-09-23 ENCOUNTER — Ambulatory Visit
Admission: RE | Admit: 2022-09-23 | Discharge: 2022-09-23 | Disposition: A | Discharge status: Home / Self Care | Payer: Medicare (Managed Care) | Source: Ambulatory Visit | Attending: Radiation Oncology | Admitting: Radiation Oncology

## 2022-09-23 DIAGNOSIS — C61 Malignant neoplasm of prostate: Secondary | ICD-10-CM | POA: Diagnosis not present

## 2022-09-23 LAB — RAD ONC ARIA SESSION SUMMARY
Course Elapsed Days: 42
Plan Fractions Treated to Date: 29
Plan Prescribed Dose Per Fraction: 2 Gy
Plan Total Fractions Prescribed: 40
Plan Total Prescribed Dose: 80 Gy
Reference Point Dosage Given to Date: 58 Gy
Reference Point Session Dosage Given: 2 Gy
Session Number: 29

## 2022-09-24 ENCOUNTER — Other Ambulatory Visit: Payer: Self-pay

## 2022-09-24 ENCOUNTER — Ambulatory Visit
Admission: RE | Admit: 2022-09-24 | Discharge: 2022-09-24 | Disposition: A | Discharge status: Home / Self Care | Payer: Medicare (Managed Care) | Source: Ambulatory Visit | Attending: Radiation Oncology | Admitting: Radiation Oncology

## 2022-09-24 DIAGNOSIS — C61 Malignant neoplasm of prostate: Secondary | ICD-10-CM | POA: Diagnosis not present

## 2022-09-24 DIAGNOSIS — Z510 Encounter for antineoplastic radiation therapy: Secondary | ICD-10-CM | POA: Diagnosis not present

## 2022-09-24 DIAGNOSIS — Z191 Hormone sensitive malignancy status: Secondary | ICD-10-CM | POA: Diagnosis not present

## 2022-09-24 LAB — RAD ONC ARIA SESSION SUMMARY
Course Elapsed Days: 43
Plan Fractions Treated to Date: 30
Plan Prescribed Dose Per Fraction: 2 Gy
Plan Total Fractions Prescribed: 40
Plan Total Prescribed Dose: 80 Gy
Reference Point Dosage Given to Date: 60 Gy
Reference Point Session Dosage Given: 2 Gy
Session Number: 30

## 2022-09-27 ENCOUNTER — Other Ambulatory Visit: Payer: Self-pay

## 2022-09-27 ENCOUNTER — Ambulatory Visit
Admission: RE | Admit: 2022-09-27 | Discharge: 2022-09-27 | Disposition: A | Discharge status: Home / Self Care | Payer: Medicare (Managed Care) | Source: Ambulatory Visit | Attending: Radiation Oncology | Admitting: Radiation Oncology

## 2022-09-27 DIAGNOSIS — C61 Malignant neoplasm of prostate: Secondary | ICD-10-CM | POA: Diagnosis not present

## 2022-09-27 LAB — RAD ONC ARIA SESSION SUMMARY
Course Elapsed Days: 46
Plan Fractions Treated to Date: 31
Plan Prescribed Dose Per Fraction: 2 Gy
Plan Total Fractions Prescribed: 40
Plan Total Prescribed Dose: 80 Gy
Reference Point Dosage Given to Date: 62 Gy
Reference Point Session Dosage Given: 2 Gy
Session Number: 31

## 2022-09-28 ENCOUNTER — Ambulatory Visit
Admission: RE | Admit: 2022-09-28 | Discharge: 2022-09-28 | Disposition: A | Discharge status: Home / Self Care | Payer: Medicare (Managed Care) | Source: Ambulatory Visit | Attending: Radiation Oncology | Admitting: Radiation Oncology

## 2022-09-28 ENCOUNTER — Other Ambulatory Visit: Payer: Self-pay

## 2022-09-28 DIAGNOSIS — C61 Malignant neoplasm of prostate: Secondary | ICD-10-CM | POA: Diagnosis not present

## 2022-09-28 LAB — RAD ONC ARIA SESSION SUMMARY
Course Elapsed Days: 47
Plan Fractions Treated to Date: 32
Plan Prescribed Dose Per Fraction: 2 Gy
Plan Total Fractions Prescribed: 40
Plan Total Prescribed Dose: 80 Gy
Reference Point Dosage Given to Date: 64 Gy
Reference Point Session Dosage Given: 2 Gy
Session Number: 32

## 2022-09-29 ENCOUNTER — Inpatient Hospital Stay: Payer: Medicare (Managed Care)

## 2022-09-29 ENCOUNTER — Other Ambulatory Visit: Payer: Self-pay | Admitting: Family Medicine

## 2022-09-29 ENCOUNTER — Ambulatory Visit
Admission: RE | Admit: 2022-09-29 | Discharge: 2022-09-29 | Disposition: A | Discharge status: Home / Self Care | Payer: Medicare (Managed Care) | Source: Ambulatory Visit | Attending: Radiation Oncology | Admitting: Radiation Oncology

## 2022-09-29 ENCOUNTER — Other Ambulatory Visit: Payer: Self-pay

## 2022-09-29 DIAGNOSIS — C61 Malignant neoplasm of prostate: Secondary | ICD-10-CM | POA: Diagnosis not present

## 2022-09-29 LAB — RAD ONC ARIA SESSION SUMMARY
Course Elapsed Days: 48
Plan Fractions Treated to Date: 33
Plan Prescribed Dose Per Fraction: 2 Gy
Plan Total Fractions Prescribed: 40
Plan Total Prescribed Dose: 80 Gy
Reference Point Dosage Given to Date: 66 Gy
Reference Point Session Dosage Given: 2 Gy
Session Number: 33

## 2022-09-29 LAB — CBC (CANCER CENTER ONLY)
HCT: 42 % (ref 39.0–52.0)
Hemoglobin: 14 g/dL (ref 13.0–17.0)
MCH: 32.7 pg (ref 26.0–34.0)
MCHC: 33.3 g/dL (ref 30.0–36.0)
MCV: 98.1 fL (ref 80.0–100.0)
Platelet Count: 176 10*3/uL (ref 150–400)
RBC: 4.28 MIL/uL (ref 4.22–5.81)
RDW: 13.4 % (ref 11.5–15.5)
WBC Count: 4.7 10*3/uL (ref 4.0–10.5)
nRBC: 0 % (ref 0.0–0.2)

## 2022-09-30 ENCOUNTER — Other Ambulatory Visit: Payer: Self-pay

## 2022-09-30 ENCOUNTER — Ambulatory Visit
Admission: RE | Admit: 2022-09-30 | Discharge: 2022-09-30 | Disposition: A | Discharge status: Home / Self Care | Payer: Medicare (Managed Care) | Source: Ambulatory Visit | Attending: Radiation Oncology | Admitting: Radiation Oncology

## 2022-09-30 DIAGNOSIS — C61 Malignant neoplasm of prostate: Secondary | ICD-10-CM | POA: Diagnosis not present

## 2022-09-30 LAB — RAD ONC ARIA SESSION SUMMARY
Course Elapsed Days: 49
Plan Fractions Treated to Date: 34
Plan Prescribed Dose Per Fraction: 2 Gy
Plan Total Fractions Prescribed: 40
Plan Total Prescribed Dose: 80 Gy
Reference Point Dosage Given to Date: 68 Gy
Reference Point Session Dosage Given: 2 Gy
Session Number: 34

## 2022-09-30 NOTE — Telephone Encounter (Signed)
Requested Prescriptions  Pending Prescriptions Disp Refills   spironolactone (ALDACTONE) 25 MG tablet [Pharmacy Med Name: SPIRONOLACTONE 25 MG TAB] 45 tablet 0    Sig: TAKE 1/2 TABLET BY MOUTH ONCE DAILY     Cardiovascular: Diuretics - Aldosterone Antagonist Passed - 09/29/2022  2:00 PM      Passed - Cr in normal range and within 180 days    Creatinine, Ser  Date Value Ref Range Status  09/10/2022 1.10 0.61 - 1.24 mg/dL Final    Comment:    Performed at [facility], [address] [place], [place] [address]         Passed - K in normal range and within 180 days    Potassium  Date Value Ref Range Status  09/10/2022 4.0 3.5 - 5.1 mmol/L Final    Comment:    Performed at [facility], [address] [place], [place] [address]         Passed - Na in normal range and within 180 days    Sodium  Date Value Ref Range Status  09/10/2022 136 135 - 145 mmol/L Final    Comment:    Performed at [facility], [address] [place], [place] [address]  04/22/2022 138 134 - 144 mmol/L Final         Passed - eGFR is 30 or above and within 180 days    GFR calc Af Amer  Date Value Ref Range Status  02/25/2020 104 >59 mL/min/1.73 Final    Comment:    **In accordance with recommendations from the NKF-ASN Task force,**   [facility] is in the process of updating its eGFR calculation to the   2021 CKD-EPI creatinine equation that estimates kidney function   without a race variable.    GFR, Estimated  Date Value Ref Range Status  09/10/2022 >60 >60 mL/min Final    Comment:    (NOTE) Calculated using the CKD-EPI Creatinine Equation (2021)    eGFR  Date Value Ref Range Status  04/22/2022 81 >59 mL/min/1.73 Final         Passed - Last BP in normal range    BP Readings from Last 1 Encounters:  09/10/22 114/73         Passed - Valid encounter within last 6 months    Recent Outpatient Visits           5 months ago Aortic atherosclerosis (HCC)   [place]  [facility] [place], Megan P, DO   6 months ago Routine general medical examination at a health care facility   [facility], [place] P, DO   10 months ago Abscess of right little finger   [place] [facility] [place], [place], DO   1 year ago Hyperthyroidism   [place] [facility] [place], [place], DO   1 year ago Hyperthyroidism   [facility] [place], Megan P, DO               furosemide (LASIX) 40 MG tablet [Pharmacy Med Name: FUROSEMIDE 40 MG TAB] 90 tablet 0    Sig: TAKE 1 TABLET BY MOUTH ONCE DAILY     Cardiovascular:  Diuretics - Loop Failed - 09/29/2022  2:00 PM      Failed - Mg Level in normal range and within 180 days    Magnesium  Date Value Ref Range Status  05/15/2021 2.3 1.7 - 2.4 mg/dL Final  Comment:    Performed at [facility], [address] [place], [place] [address]         Passed - K in normal range and within 180 days    Potassium  Date Value Ref Range Status  09/10/2022 4.0 3.5 - 5.1 mmol/L Final    Comment:    Performed at [facility], [address] [place], [place] [address]         Passed - Ca in normal range and within 180 days    Calcium  Date Value Ref Range Status  09/10/2022 9.3 8.9 - 10.3 mg/dL Final    Comment:    Performed at [facility], [address] [place], [place] [address]         Passed - Na in normal range and within 180 days    Sodium  Date Value Ref Range Status  09/10/2022 136 135 - 145 mmol/L Final    Comment:    Performed at [facility], [address] [place], [place] [address]  04/22/2022 138 134 - 144 mmol/L Final         Passed - Cr in normal range and within 180 days    Creatinine, Ser  Date Value Ref Range Status  09/10/2022 1.10 0.61 - 1.24 mg/dL Final    Comment:    Performed at [facility], [address] [place], [place] [address]         Passed -  Cl in normal range and within 180 days    Chloride  Date Value Ref Range Status  09/10/2022 104 98 - 111 mmol/L Final    Comment:    Performed at [facility], [address], [place], [place] [address]         Passed - Last BP in normal range    BP Readings from Last 1 Encounters:  09/10/22 114/73         Passed - Valid encounter within last 6 months    Recent Outpatient Visits           5 months ago Aortic atherosclerosis (HCC)   [place] [facility] [place], Megan P, DO   6 months ago Routine general medical examination at a health care facility   [facility], [place] P, DO   10 months ago Abscess of right little finger   [place] [facility] [place], [place], DO   1 year ago Hyperthyroidism   [facility] [place], [place], DO   1 year ago Hyperthyroidism   [place] [facility], Megan P, DO               JARDIANCE 10 MG TABS tablet [Pharmacy Med Name: JARDIANCE 10 MG TAB] 90 tablet 0    Sig: TAKE 1 TABLET BY MOUTH ONCE DAILY     Endocrinology:  Diabetes - SGLT2 Inhibitors Failed - 09/29/2022  2:00 PM      Failed - HBA1C is between 0 and 7.9 and within 180 days    HB A1C (BAYER DCA - WAIVED)  Date Value Ref Range Status  08/22/2018 5.4 <7.0 % Final    Comment:                                          Diabetic Adult            <  7.0                                       Healthy Adult        4.3 - 5.7                                                           (DCCT/NGSP) American Diabetes Association's Summary of Glycemic Recommendations for Adults with Diabetes: Hemoglobin A1c <7.0%. More stringent glycemic goals (A1c <6.0%) may further reduce complications at the cost of increased risk of hypoglycemia.          Passed - Cr in normal range and within 360 days    Creatinine, Ser  Date Value Ref Range Status  09/10/2022 1.10 0.61 - 1.24 mg/dL Final     Comment:    Performed at [facility], [address] [place], [place] [address]         Passed - eGFR in normal range and within 360 days    GFR calc Af Amer  Date Value Ref Range Status  02/25/2020 104 >59 mL/min/1.73 Final    Comment:    **In accordance with recommendations from the NKF-ASN Task force,**   [facility] is in the process of updating its eGFR calculation to the   2021 CKD-EPI creatinine equation that estimates kidney function   without a race variable.    GFR, Estimated  Date Value Ref Range Status  09/10/2022 >60 >60 mL/min Final    Comment:    (NOTE) Calculated using the CKD-EPI Creatinine Equation (2021)    eGFR  Date Value Ref Range Status  04/22/2022 81 >59 mL/min/1.73 Final         Passed - Valid encounter within last 6 months    Recent Outpatient Visits           5 months ago Aortic atherosclerosis (HCC)   [place] [facility] [place], Megan P, DO   6 months ago Routine general medical examination at a health care facility   [facility] [place] P, DO   10 months ago Abscess of right little finger   [place] [facility] [place], [place] P, DO   1 year ago Hyperthyroidism   [place] [facility] [place], [place] P, DO   1 year ago Hyperthyroidism    [facility], Megan P, DO               ELIQUIS 5 MG TABS tablet [Pharmacy Med Name: ELIQUIS 5 MG TAB] 180 tablet 0    Sig: TAKE 1 TABLET BY MOUTH TWICE DAILY     Hematology:  Anticoagulants - apixaban Passed - 09/29/2022  2:00 PM      Passed - PLT in normal range and within 360 days    Platelets  Date Value Ref Range Status  03/25/2022 166 150 - 450 x10E3/uL Final   Platelet Count  Date Value Ref Range Status  09/29/2022 176 150 - 400 K/uL Final         Passed - HGB in normal range and within 360 days    Hemoglobin  Date Value Ref Range Status  09/29/2022 14.0 13.0 - 17.0  g/dL Final  16/01/9603 54.0 13.0 - 17.7 g/dL Final         Passed - HCT in normal range and within 360 days    HCT  Date Value Ref Range Status  09/29/2022 42.0 39.0 - 52.0 % Final   Hematocrit  Date Value Ref Range Status  03/25/2022 48.6 37.5 - 51.0 % Final         Passed - Cr in normal range and within 360 days    Creatinine, Ser  Date Value Ref Range Status  09/10/2022 1.10 0.61 - 1.24 mg/dL Final    Comment:    Performed at [facility], [address] [place], [place] [address]         Passed - AST in normal range and within 360 days    AST  Date Value Ref Range Status  09/10/2022 18 15 - 41 U/L Final         Passed - ALT in normal range and within 360 days    ALT  Date Value Ref Range Status  09/10/2022 16 0 - 44 U/L Final         Passed - Valid encounter within last 12 months    Recent Outpatient Visits           5 months ago Aortic atherosclerosis (HCC)   [place] [facility] [place], Megan P, DO   6 months ago Routine general medical examination at a health care facility   [facility], [place] P, DO   10 months ago Abscess of right little finger   [place] [facility] [place], [place], DO   1 year ago Hyperthyroidism   [facility] [facility] [place], Megan P, DO   1 year ago Hyperthyroidism   [place] [facility] [place], Megan P, DO               digoxin (LANOXIN) 0.125 MG tablet [Pharmacy Med Name: DIGOXIN 125 MCG TAB] 90 tablet 0    Sig: TAKE 1 TABLET BY MOUTH ONCE DAILY     Cardiovascular:  Antiarrhythmic Agents - digoxin Failed - 09/29/2022  2:00 PM      Failed - Digoxin (serum) in normal range and within 360 days    No results found for: "DIGOXIN"       Failed - Mg Level in normal range and within 360 days    Magnesium  Date Value Ref Range Status  05/15/2021 2.3 1.7 - 2.4 mg/dL Final    Comment:    Performed at [facility],  [address] [place], [place] [address]         Passed - Cr in normal range and within 180 days    Creatinine, Ser  Date Value Ref Range Status  09/10/2022 1.10 0.61 - 1.24 mg/dL Final    Comment:    Performed at [facility], [address] [place], [place] [address]         Passed - Ca in normal range and within 360 days    Calcium  Date Value Ref Range Status  09/10/2022 9.3 8.9 - 10.3 mg/dL Final    Comment:    Performed at [facility], [address] [place], [place] [address]         Passed - K in normal range and within 180 days    Potassium  Date Value Ref Range Status  09/10/2022 4.0 3.5 - 5.1  mmol/L Final    Comment:    Performed at [facility], [address] [place], [place] [address]         Passed - Patient had ECG in the last 360 days      Passed - Last Heart Rate in normal range    Pulse Readings from Last 1 Encounters:  09/10/22 67         Passed - Valid encounter within last 6 months    Recent Outpatient Visits           5 months ago Aortic atherosclerosis (HCC)   [place] [facility] [place], Megan P, DO   6 months ago Routine general medical examination at a health care facility   [facility], [place] P, DO   10 months ago Abscess of right little finger   [place] [facility] [place], [place], DO   1 year ago Hyperthyroidism   [place] [facility] [place], Megan P, DO   1 year ago Hyperthyroidism   [place] [facility] [place], [place], DO

## 2022-10-01 ENCOUNTER — Ambulatory Visit
Admission: RE | Admit: 2022-10-01 | Discharge: 2022-10-01 | Disposition: A | Discharge status: Home / Self Care | Payer: Medicare (Managed Care) | Source: Ambulatory Visit | Attending: Radiation Oncology | Admitting: Radiation Oncology

## 2022-10-01 ENCOUNTER — Other Ambulatory Visit: Payer: Self-pay

## 2022-10-01 DIAGNOSIS — C61 Malignant neoplasm of prostate: Secondary | ICD-10-CM | POA: Diagnosis not present

## 2022-10-01 DIAGNOSIS — Z510 Encounter for antineoplastic radiation therapy: Secondary | ICD-10-CM | POA: Diagnosis not present

## 2022-10-01 DIAGNOSIS — Z191 Hormone sensitive malignancy status: Secondary | ICD-10-CM | POA: Diagnosis not present

## 2022-10-01 LAB — RAD ONC ARIA SESSION SUMMARY
Course Elapsed Days: 50
Plan Fractions Treated to Date: 35
Plan Prescribed Dose Per Fraction: 2 Gy
Plan Total Fractions Prescribed: 40
Plan Total Prescribed Dose: 80 Gy
Reference Point Dosage Given to Date: 70 Gy
Reference Point Session Dosage Given: 2 Gy
Session Number: 35

## 2022-10-04 ENCOUNTER — Ambulatory Visit
Admission: RE | Admit: 2022-10-04 | Discharge: 2022-10-04 | Disposition: A | Discharge status: Home / Self Care | Payer: Medicare (Managed Care) | Source: Ambulatory Visit | Attending: Radiation Oncology | Admitting: Radiation Oncology

## 2022-10-04 ENCOUNTER — Other Ambulatory Visit: Payer: Self-pay

## 2022-10-04 DIAGNOSIS — Z832 Family history of diseases of the blood and blood-forming organs and certain disorders involving the immune mechanism: Secondary | ICD-10-CM | POA: Diagnosis not present

## 2022-10-04 DIAGNOSIS — E039 Hypothyroidism, unspecified: Secondary | ICD-10-CM | POA: Diagnosis not present

## 2022-10-04 DIAGNOSIS — C61 Malignant neoplasm of prostate: Secondary | ICD-10-CM | POA: Diagnosis not present

## 2022-10-04 DIAGNOSIS — Z7901 Long term (current) use of anticoagulants: Secondary | ICD-10-CM | POA: Diagnosis not present

## 2022-10-04 DIAGNOSIS — E785 Hyperlipidemia, unspecified: Secondary | ICD-10-CM | POA: Insufficient documentation

## 2022-10-04 DIAGNOSIS — I110 Hypertensive heart disease with heart failure: Secondary | ICD-10-CM | POA: Insufficient documentation

## 2022-10-04 DIAGNOSIS — Z79899 Other long term (current) drug therapy: Secondary | ICD-10-CM | POA: Diagnosis not present

## 2022-10-04 DIAGNOSIS — N400 Enlarged prostate without lower urinary tract symptoms: Secondary | ICD-10-CM | POA: Insufficient documentation

## 2022-10-04 DIAGNOSIS — I4891 Unspecified atrial fibrillation: Secondary | ICD-10-CM | POA: Diagnosis not present

## 2022-10-04 DIAGNOSIS — F17210 Nicotine dependence, cigarettes, uncomplicated: Secondary | ICD-10-CM | POA: Insufficient documentation

## 2022-10-04 DIAGNOSIS — Z8249 Family history of ischemic heart disease and other diseases of the circulatory system: Secondary | ICD-10-CM | POA: Insufficient documentation

## 2022-10-04 DIAGNOSIS — Z833 Family history of diabetes mellitus: Secondary | ICD-10-CM | POA: Diagnosis not present

## 2022-10-04 DIAGNOSIS — Z8349 Family history of other endocrine, nutritional and metabolic diseases: Secondary | ICD-10-CM | POA: Insufficient documentation

## 2022-10-04 DIAGNOSIS — I509 Heart failure, unspecified: Secondary | ICD-10-CM | POA: Diagnosis not present

## 2022-10-04 DIAGNOSIS — Z825 Family history of asthma and other chronic lower respiratory diseases: Secondary | ICD-10-CM | POA: Diagnosis not present

## 2022-10-04 DIAGNOSIS — F1290 Cannabis use, unspecified, uncomplicated: Secondary | ICD-10-CM | POA: Diagnosis not present

## 2022-10-04 LAB — RAD ONC ARIA SESSION SUMMARY
Course Elapsed Days: 53
Plan Fractions Treated to Date: 36
Plan Prescribed Dose Per Fraction: 2 Gy
Plan Total Fractions Prescribed: 40
Plan Total Prescribed Dose: 80 Gy
Reference Point Dosage Given to Date: 72 Gy
Reference Point Session Dosage Given: 2 Gy
Session Number: 36

## 2022-10-05 ENCOUNTER — Ambulatory Visit
Admission: RE | Admit: 2022-10-05 | Discharge: 2022-10-05 | Disposition: A | Discharge status: Home / Self Care | Payer: Medicare (Managed Care) | Source: Ambulatory Visit | Attending: Radiation Oncology | Admitting: Radiation Oncology

## 2022-10-05 ENCOUNTER — Other Ambulatory Visit: Payer: Self-pay

## 2022-10-05 DIAGNOSIS — C61 Malignant neoplasm of prostate: Secondary | ICD-10-CM | POA: Diagnosis not present

## 2022-10-05 LAB — RAD ONC ARIA SESSION SUMMARY
Course Elapsed Days: 54
Plan Fractions Treated to Date: 37
Plan Prescribed Dose Per Fraction: 2 Gy
Plan Total Fractions Prescribed: 40
Plan Total Prescribed Dose: 80 Gy
Reference Point Dosage Given to Date: 74 Gy
Reference Point Session Dosage Given: 2 Gy
Session Number: 37

## 2022-10-06 ENCOUNTER — Other Ambulatory Visit: Payer: Self-pay

## 2022-10-06 ENCOUNTER — Ambulatory Visit
Admission: RE | Admit: 2022-10-06 | Discharge: 2022-10-06 | Disposition: A | Discharge status: Home / Self Care | Payer: Medicare (Managed Care) | Source: Ambulatory Visit | Attending: Radiation Oncology | Admitting: Radiation Oncology

## 2022-10-06 DIAGNOSIS — C61 Malignant neoplasm of prostate: Secondary | ICD-10-CM | POA: Diagnosis not present

## 2022-10-06 LAB — RAD ONC ARIA SESSION SUMMARY
Course Elapsed Days: 55
Plan Fractions Treated to Date: 38
Plan Prescribed Dose Per Fraction: 2 Gy
Plan Total Fractions Prescribed: 40
Plan Total Prescribed Dose: 80 Gy
Reference Point Dosage Given to Date: 76 Gy
Reference Point Session Dosage Given: 2 Gy
Session Number: 38

## 2022-10-08 ENCOUNTER — Ambulatory Visit: Payer: Medicare (Managed Care)

## 2022-10-08 ENCOUNTER — Ambulatory Visit: Admission: RE | Admit: 2022-10-08 | Payer: Medicare (Managed Care) | Source: Ambulatory Visit

## 2022-10-08 ENCOUNTER — Other Ambulatory Visit: Payer: Self-pay

## 2022-10-08 DIAGNOSIS — C61 Malignant neoplasm of prostate: Secondary | ICD-10-CM | POA: Diagnosis not present

## 2022-10-08 LAB — RAD ONC ARIA SESSION SUMMARY
Course Elapsed Days: 57
Plan Fractions Treated to Date: 39
Plan Prescribed Dose Per Fraction: 2 Gy
Plan Total Fractions Prescribed: 40
Plan Total Prescribed Dose: 80 Gy
Reference Point Dosage Given to Date: 78 Gy
Reference Point Session Dosage Given: 2 Gy
Session Number: 39

## 2022-10-11 ENCOUNTER — Ambulatory Visit
Admission: RE | Admit: 2022-10-11 | Discharge: 2022-10-11 | Disposition: A | Discharge status: Home / Self Care | Payer: Medicare (Managed Care) | Source: Ambulatory Visit | Attending: Radiation Oncology | Admitting: Radiation Oncology

## 2022-10-11 ENCOUNTER — Other Ambulatory Visit: Payer: Self-pay

## 2022-10-11 DIAGNOSIS — Z191 Hormone sensitive malignancy status: Secondary | ICD-10-CM | POA: Diagnosis not present

## 2022-10-11 DIAGNOSIS — Z510 Encounter for antineoplastic radiation therapy: Secondary | ICD-10-CM | POA: Diagnosis not present

## 2022-10-11 DIAGNOSIS — C61 Malignant neoplasm of prostate: Secondary | ICD-10-CM | POA: Diagnosis not present

## 2022-10-11 LAB — RAD ONC ARIA SESSION SUMMARY
Course Elapsed Days: 60
Plan Fractions Treated to Date: 40
Plan Prescribed Dose Per Fraction: 2 Gy
Plan Total Fractions Prescribed: 40
Plan Total Prescribed Dose: 80 Gy
Reference Point Dosage Given to Date: 80 Gy
Reference Point Session Dosage Given: 2 Gy
Session Number: 40

## 2022-11-17 ENCOUNTER — Encounter: Payer: Self-pay | Admitting: Radiation Oncology

## 2022-11-17 ENCOUNTER — Other Ambulatory Visit: Payer: Self-pay | Admitting: *Deleted

## 2022-11-17 ENCOUNTER — Ambulatory Visit
Admission: RE | Admit: 2022-11-17 | Discharge: 2022-11-17 | Disposition: A | Discharge status: Home / Self Care | Payer: Medicare (Managed Care) | Source: Ambulatory Visit | Attending: Radiation Oncology | Admitting: Radiation Oncology

## 2022-11-17 VITALS — BP 103/70 | HR 70 | Temp 97.6°F | Resp 16 | Wt 155.0 lb

## 2022-11-17 DIAGNOSIS — C61 Malignant neoplasm of prostate: Secondary | ICD-10-CM

## 2022-11-17 NOTE — Progress Notes (Signed)
Radiation Oncology Follow up Note  Name: Tyler Cohen   Date:   11/17/2022 MRN:  [id number] DOB: 10/15/1955    This 67 y.o. male presents to the clinic today for 1 month follow-up status post image guided radiation therapy for Gleason 7 (4+3) adenocarcinoma presenting with a PSA in the 20 range.Marland Kitchen  REFERRING PROVIDER: Dorcas Carrow, DO  HPI: Patient is a 67 year old male now out 1 month having completed external beam image guided radiation therapy to his prostate and pelvic nodes for Gleason 7 (4+3) adenocarcinoma presenting with a PSA in the 20 range.  Seen today in routine follow-up he is doing well.  Specifically denies any increased lower urinary tract symptoms diarrhea or fatigue unfortunately he recently lost his wife to advanced lung disease..  COMPLICATIONS OF TREATMENT: none  FOLLOW UP COMPLIANCE: keeps appointments   PHYSICAL EXAM:  BP 103/70   Pulse 70   Temp 97.6 F (36.4 C) (Tympanic)   Resp 16   Wt 155 lb (70.3 kg)   BMI 20.45 kg/m  Well-developed well-nourished patient in NAD. HEENT reveals PERLA, EOMI, discs not visualized.  Oral cavity is clear. No oral mucosal lesions are identified. Neck is clear without evidence of cervical or supraclavicular adenopathy. Lungs are clear to A&P. Cardiac examination is essentially unremarkable with regular rate and rhythm without murmur rub or thrill. Abdomen is benign with no organomegaly or masses noted. Motor sensory and DTR levels are equal and symmetric in the upper and lower extremities. Cranial nerves II through XII are grossly intact. Proprioception is intact. No peripheral adenopathy or edema is identified. No motor or sensory levels are noted. Crude visual fields are within normal range.  RADIOLOGY RESULTS: No current films for review  PLAN: Present time patient is doing well with low side effect profile from his radiation therapy.  I have asked to see him back in 3 months for follow-up with a PSA at that time.  He  continues on ADT therapy.  He continues follow-up care with urology.  Patient knows to call with any concerns.  I would like to take this opportunity to thank you for allowing me to participate in the care of your patient.Carmina Miller, MD

## 2022-12-01 ENCOUNTER — Other Ambulatory Visit: Payer: Self-pay | Admitting: Family Medicine

## 2022-12-02 NOTE — Telephone Encounter (Signed)
Called patient to schedule appt for medication refills. Patient reports he has medication and not about to run out. Patient reports he has an important phone call coming in and will have to call back to schedule and appt for medication refills.

## 2022-12-02 NOTE — Telephone Encounter (Signed)
Requested by interface surescripts. Overdue OV last OV 04/22/22. Last labs 08/22/2018. Last doc. Refill 11/01/22 #90. Requesting too soon. Called patient to schedule appt for med refills. Patient reports he has medication and does not need refills at this time. Patient will call back for appt. No courtesy refill needed at this time.  Requested Prescriptions  Refused Prescriptions Disp Refills   JARDIANCE 10 MG TABS tablet [Pharmacy Med Name: JARDIANCE 10 MG TAB] 90 tablet 0    Sig: TAKE 1 TABLET BY MOUTH ONCE DAILY     Endocrinology:  Diabetes - SGLT2 Inhibitors Failed - 12/01/2022  5:15 PM      Failed - HBA1C is between 0 and 7.9 and within 180 days    HB A1C (BAYER DCA - WAIVED)  Date Value Ref Range Status  08/22/2018 5.4 <7.0 % Final    Comment:                                          Diabetic Adult            <7.0                                       Healthy Adult        4.3 - 5.7                                                           (DCCT/NGSP) American Diabetes Association's Summary of Glycemic Recommendations for Adults with Diabetes: Hemoglobin A1c <7.0%. More stringent glycemic goals (A1c <6.0%) may further reduce complications at the cost of increased risk of hypoglycemia.          Failed - Valid encounter within last 6 months    Recent Outpatient Visits           7 months ago Aortic atherosclerosis (HCC)   [place] [facility] [place], Megan P, DO   8 months ago Routine general medical examination at a health care facility   [facility] [place], [place] P, DO   1 year ago Abscess of right little finger   [place] [facility] [place], [place] P, DO   1 year ago Hyperthyroidism   [place] [facility] [place], [place], DO   1 year ago Hyperthyroidism   [place] [facility] [place], Megan P, DO              Passed - Cr in normal range and within 360 days    Creatinine, Ser  Date  Value Ref Range Status  09/10/2022 1.10 0.61 - 1.24 mg/dL Final    Comment:    Performed at [facility], [address] [place], [place] [address]         Passed - eGFR in normal range and within 360 days    GFR calc Af Amer  Date Value Ref Range Status  02/25/2020 104 >59 mL/min/1.73 Final    Comment:    **In accordance with recommendations from the NKF-ASN Task force,**   [facility] is in the process of updating its eGFR  calculation to the   2021 CKD-EPI creatinine equation that estimates kidney function   without a race variable.    GFR, Estimated  Date Value Ref Range Status  09/10/2022 >60 >60 mL/min Final    Comment:    (NOTE) Calculated using the CKD-EPI Creatinine Equation (2021)    eGFR  Date Value Ref Range Status  04/22/2022 81 >59 mL/min/1.73 Final

## 2022-12-14 DIAGNOSIS — E0521 Thyrotoxicosis with toxic multinodular goiter with thyrotoxic crisis or storm: Secondary | ICD-10-CM | POA: Diagnosis not present

## 2022-12-14 DIAGNOSIS — Z79899 Other long term (current) drug therapy: Secondary | ICD-10-CM | POA: Diagnosis not present

## 2022-12-14 DIAGNOSIS — E0590 Thyrotoxicosis, unspecified without thyrotoxic crisis or storm: Secondary | ICD-10-CM | POA: Diagnosis not present

## 2022-12-14 DIAGNOSIS — Z23 Encounter for immunization: Secondary | ICD-10-CM | POA: Diagnosis not present

## 2022-12-14 DIAGNOSIS — Z2821 Immunization not carried out because of patient refusal: Secondary | ICD-10-CM | POA: Diagnosis not present

## 2022-12-14 DIAGNOSIS — E279 Disorder of adrenal gland, unspecified: Secondary | ICD-10-CM | POA: Diagnosis not present

## 2022-12-20 ENCOUNTER — Ambulatory Visit (INDEPENDENT_AMBULATORY_CARE_PROVIDER_SITE_OTHER): Payer: Medicare (Managed Care) | Admitting: Family Medicine

## 2022-12-20 ENCOUNTER — Encounter: Payer: Self-pay | Admitting: Family Medicine

## 2022-12-20 VITALS — BP 110/72 | HR 65 | Temp 98.0°F | Wt 177.2 lb

## 2022-12-20 DIAGNOSIS — I5022 Chronic systolic (congestive) heart failure: Secondary | ICD-10-CM | POA: Diagnosis not present

## 2022-12-20 DIAGNOSIS — E0590 Thyrotoxicosis, unspecified without thyrotoxic crisis or storm: Secondary | ICD-10-CM

## 2022-12-20 DIAGNOSIS — I480 Paroxysmal atrial fibrillation: Secondary | ICD-10-CM

## 2022-12-20 DIAGNOSIS — E7800 Pure hypercholesterolemia, unspecified: Secondary | ICD-10-CM

## 2022-12-20 DIAGNOSIS — I700 Atherosclerosis of aorta: Secondary | ICD-10-CM

## 2022-12-20 LAB — MICROALBUMIN, URINE WAIVED
Creatinine, Urine Waived: 200 mg/dL (ref 10–300)
Microalb, Ur Waived: 30 mg/L — ABNORMAL HIGH (ref 0–19)
Microalb/Creat Ratio: <30 mg/g (ref <30)

## 2022-12-20 MED ORDER — SPIRONOLACTONE 25 MG PO TABS: 12.5000 mg | ORAL_TABLET | Freq: Every day | ORAL | 1 refills | Status: DC

## 2022-12-20 MED ORDER — ATORVASTATIN CALCIUM 20 MG PO TABS: ORAL_TABLET | ORAL | 2 refills | Status: DC

## 2022-12-20 MED ORDER — LOSARTAN POTASSIUM 25 MG PO TABS: 25.0000 mg | ORAL_TABLET | Freq: Every day | ORAL | 1 refills | Status: DC

## 2022-12-20 NOTE — Assessment & Plan Note (Signed)
Euvolemic today. Continue to follow with cardiology. Stable. Keep BP and cholesterol under good control.

## 2022-12-20 NOTE — Assessment & Plan Note (Signed)
Continue to follow with cardiology. Stable. Keep BP and cholesterol under good control.

## 2022-12-20 NOTE — Assessment & Plan Note (Signed)
Stable. Following with endocrine. Call with any concerns.

## 2022-12-20 NOTE — Progress Notes (Signed)
BP 110/72   Pulse 65   Temp 98 F (36.7 C) (Oral)   Wt 177 lb 3.2 oz (80.4 kg)   SpO2 98%   BMI 23.38 kg/m    Subjective:    Patient ID: Tyler Cohen, male    DOB: 1955-07-22, 67 y.o.   MRN: [id number]  HPI: Tyler Cohen is a 67 y.o. male  Chief Complaint  Patient presents with   Hyperthyroidism   OTHER    Pt just lost his wife and is requesting handicap place card.   HYPERTENSION / HYPERLIPIDEMIA Satisfied with current treatment? yes Duration of hypertension: chronic BP monitoring frequency: not checking BP medication side effects: no Past BP meds: losartan, lasix, metoprolol, spironalactone Duration of hyperlipidemia: chronic Cholesterol medication side effects: no Cholesterol supplements: none Past cholesterol medications: atorvastatin Medication compliance: excellent compliance Aspirin: no Recent stressors: no Recurrent headaches: no Visual changes: no Palpitations: no Dyspnea: no Chest pain: no Lower extremity edema: no Dizzy/lightheaded: no  HYPERTHYROIDISM Thyroid control status:controlled Satisfied with current treatment? yes Medication side effects: no Medication compliance: excellent compliance Recent dose adjustment:no Fatigue: no Cold intolerance: no Heat intolerance: no Weight gain: no Weight loss: no Constipation: no Diarrhea/loose stools: no Palpitations: no Lower extremity edema: no Anxiety/depressed mood: no   Relevant past medical, surgical, family and social history reviewed and updated as indicated. Interim medical history since our last visit reviewed. Allergies and medications reviewed and updated.  Review of Systems  Constitutional: Negative.   Respiratory: Negative.    Cardiovascular: Negative.   Gastrointestinal: Negative.   Musculoskeletal: Negative.   Psychiatric/Behavioral: Negative.      Per HPI unless specifically indicated above     Objective:    BP 110/72   Pulse 65   Temp 98 F (36.7 C) (Oral)    Wt 177 lb 3.2 oz (80.4 kg)   SpO2 98%   BMI 23.38 kg/m   Wt Readings from Last 3 Encounters:  12/20/22 177 lb 3.2 oz (80.4 kg)  11/17/22 155 lb (70.3 kg)  09/10/22 151 lb (68.5 kg)    Physical Exam Vitals and nursing note reviewed.  Constitutional:      General: He is not in acute distress.    Appearance: Normal appearance. He is not ill-appearing, toxic-appearing or diaphoretic.  HENT:     Head: Normocephalic and atraumatic.     Right Ear: External ear normal.     Left Ear: External ear normal.     Nose: Nose normal.     Mouth/Throat:     Mouth: Mucous membranes are moist.     Pharynx: Oropharynx is clear.  Eyes:     General: No scleral icterus.       Right eye: No discharge.        Left eye: No discharge.     Extraocular Movements: Extraocular movements intact.     Conjunctiva/sclera: Conjunctivae normal.     Pupils: Pupils are equal, round, and reactive to light.  Cardiovascular:     Rate and Rhythm: Normal rate and regular rhythm.     Pulses: Normal pulses.     Heart sounds: Murmur heard.     No friction rub. No gallop.  Pulmonary:     Effort: Pulmonary effort is normal. No respiratory distress.     Breath sounds: Normal breath sounds. No stridor. No wheezing, rhonchi or rales.  Chest:     Chest wall: No tenderness.  Musculoskeletal:        General: Normal range of  motion.     Cervical back: Normal range of motion and neck supple.  Skin:    General: Skin is warm and dry.     Capillary Refill: Capillary refill takes less than 2 seconds.     Coloration: Skin is not jaundiced or pale.     Findings: No bruising, erythema, lesion or rash.  Neurological:     General: No focal deficit present.     Mental Status: He is alert and oriented to person, place, and time. Mental status is at baseline.  Psychiatric:        Mood and Affect: Mood normal.        Behavior: Behavior normal.        Thought Content: Thought content normal.        Judgment: Judgment normal.      Results for orders placed or performed in visit on 10/11/22  Rad Onc [facility] Session Summary  Result Value Ref Range   Course ID [email or web address]    Course Start Date 08/02/2022    Session Number 40    Course First Treatment Date 08/12/2022  1:42 PM    Course Last Treatment Date 10/11/2022  9:00 AM    Course Elapsed Days 60    Reference Point ID Prostate DP    Reference Point Dosage Given to Date 80 Gy   Reference Point Session Dosage Given 2 Gy   Plan ID Prostate    Plan Name Prostate    Plan Fractions Treated to Date 40    Plan Total Fractions Prescribed 40    Plan Prescribed Dose Per Fraction 2 Gy   Plan Total Prescribed Dose 80.000000 Gy   Plan Primary Reference Point Prostate DP       Assessment & Plan:   Problem List Items Addressed This Visit       Cardiovascular and Mediastinum   Aortic atherosclerosis (HCC)    Continue to follow with cardiology. Stable. Keep BP and cholesterol under good control.       Relevant Medications   losartan (COZAAR) 25 MG tablet   atorvastatin (LIPITOR) 20 MG tablet   spironolactone (ALDACTONE) 25 MG tablet   Atrial fibrillation (HCC)    Continue to follow with cardiology. Stable. Keep BP and cholesterol under good control.       Relevant Medications   losartan (COZAAR) 25 MG tablet   atorvastatin (LIPITOR) 20 MG tablet   spironolactone (ALDACTONE) 25 MG tablet   Congestive heart failure (HCC)    Euvolemic today. Continue to follow with cardiology. Stable. Keep BP and cholesterol under good control.       Relevant Medications   losartan (COZAAR) 25 MG tablet   atorvastatin (LIPITOR) 20 MG tablet   spironolactone (ALDACTONE) 25 MG tablet   Other Relevant Orders   CBC with Differential/Platelet   Comprehensive metabolic panel   Microalbumin, Urine Waived     Endocrine   Hyperthyroidism    Stable. Following with endocrine. Call with any concerns.      Relevant Orders   CBC with Differential/Platelet   Comprehensive metabolic  panel     Other   Hyperlipidemia - Primary    Under good control on current regimen. Continue current regimen. Continue to monitor. Call with any concerns. Refills given. Labs drawn today.       Relevant Medications   losartan (COZAAR) 25 MG tablet   atorvastatin (LIPITOR) 20 MG tablet   spironolactone (ALDACTONE) 25 MG tablet   Other Relevant Orders   CBC  with Differential/Platelet   Comprehensive metabolic panel   Lipid Panel w/o Chol/HDL Ratio     Follow up plan: Return in about 6 months (around 06/19/2023) for physical.

## 2022-12-20 NOTE — Assessment & Plan Note (Signed)
Under good control on current regimen. Continue current regimen. Continue to monitor. Call with any concerns. Refills given. Labs drawn today.   

## 2022-12-21 LAB — COMPREHENSIVE METABOLIC PANEL
ALT: 15 IU/L (ref 0–44)
AST: 15 IU/L (ref 0–40)
Albumin: 4.1 g/dL (ref 3.9–4.9)
Alkaline Phosphatase: 79 IU/L (ref 44–121)
BUN/Creatinine Ratio: 11 (ref 10–24)
BUN: 11 mg/dL (ref 8–27)
Bilirubin Total: 0.3 mg/dL (ref 0.0–1.2)
CO2: 24 mmol/L (ref 20–29)
Calcium: 9.6 mg/dL (ref 8.6–10.2)
Chloride: 104 mmol/L (ref 96–106)
Creatinine, Ser: 1.01 mg/dL (ref 0.76–1.27)
Globulin, Total: 2.4 g/dL (ref 1.5–4.5)
Glucose: 94 mg/dL (ref 70–99)
Potassium: 4.2 mmol/L (ref 3.5–5.2)
Sodium: 139 mmol/L (ref 134–144)
Total Protein: 6.5 g/dL (ref 6.0–8.5)
eGFR: 82 mL/min/{1.73_m2} (ref >59)

## 2022-12-21 LAB — CBC WITH DIFFERENTIAL/PLATELET
Basophils Absolute: 0 10*3/uL (ref 0.0–0.2)
Basos: 0 %
EOS (ABSOLUTE): 0.2 10*3/uL (ref 0.0–0.4)
Eos: 5 %
Hematocrit: 40.4 % (ref 37.5–51.0)
Hemoglobin: 13.1 g/dL (ref 13.0–17.7)
Immature Grans (Abs): 0 10*3/uL (ref 0.0–0.1)
Immature Granulocytes: 0 %
Lymphocytes Absolute: 0.6 10*3/uL — ABNORMAL LOW (ref 0.7–3.1)
Lymphs: 14 %
MCH: 33.6 pg — ABNORMAL HIGH (ref 26.6–33.0)
MCHC: 32.4 g/dL (ref 31.5–35.7)
MCV: 104 fL — ABNORMAL HIGH (ref 79–97)
Monocytes Absolute: 0.3 10*3/uL (ref 0.1–0.9)
Monocytes: 8 %
Neutrophils Absolute: 2.8 10*3/uL (ref 1.4–7.0)
Neutrophils: 73 %
Platelets: 203 10*3/uL (ref 150–450)
RBC: 3.9 x10E6/uL — ABNORMAL LOW (ref 4.14–5.80)
RDW: 12.6 % (ref 11.6–15.4)
WBC: 3.9 10*3/uL (ref 3.4–10.8)

## 2022-12-21 LAB — LIPID PANEL W/O CHOL/HDL RATIO
Cholesterol, Total: 215 mg/dL — ABNORMAL HIGH (ref 100–199)
HDL: 65 mg/dL (ref >39)
LDL Chol Calc (NIH): 135 mg/dL — ABNORMAL HIGH (ref 0–99)
Triglycerides: 86 mg/dL (ref 0–149)
VLDL Cholesterol Cal: 15 mg/dL (ref 5–40)

## 2022-12-26 ENCOUNTER — Encounter: Payer: Self-pay | Admitting: Family Medicine

## 2022-12-29 ENCOUNTER — Other Ambulatory Visit: Payer: Self-pay | Admitting: Family Medicine

## 2022-12-30 DIAGNOSIS — E0590 Thyrotoxicosis, unspecified without thyrotoxic crisis or storm: Secondary | ICD-10-CM | POA: Diagnosis not present

## 2022-12-30 DIAGNOSIS — E042 Nontoxic multinodular goiter: Secondary | ICD-10-CM | POA: Diagnosis not present

## 2022-12-30 NOTE — Telephone Encounter (Signed)
Requested Prescriptions  Pending Prescriptions Disp Refills   digoxin (LANOXIN) 0.125 MG tablet [Pharmacy Med Name: DIGOXIN 125 MCG TAB] 90 tablet 0    Sig: TAKE 1 TABLET BY MOUTH ONCE DAILY     Cardiovascular:  Antiarrhythmic Agents - digoxin Failed - 12/29/2022  1:01 PM      Failed - Digoxin (serum) in normal range and within 360 days    No results found for: "DIGOXIN"       Failed - Mg Level in normal range and within 360 days    Magnesium  Date Value Ref Range Status  05/15/2021 2.3 1.7 - 2.4 mg/dL Final    Comment:    Performed at [facility], [address], [place], [place] [address]         Passed - Cr in normal range and within 180 days    Creatinine, Ser  Date Value Ref Range Status  12/20/2022 1.01 0.76 - 1.27 mg/dL Final         Passed - Ca in normal range and within 360 days    Calcium  Date Value Ref Range Status  12/20/2022 9.6 8.6 - 10.2 mg/dL Final         Passed - K in normal range and within 180 days    Potassium  Date Value Ref Range Status  12/20/2022 4.2 3.5 - 5.2 mmol/L Final         Passed - Patient had ECG in the last 360 days      Passed - Last Heart Rate in normal range    Pulse Readings from Last 1 Encounters:  12/20/22 65         Passed - Valid encounter within last 6 months    Recent Outpatient Visits           1 week ago Pure hypercholesterolemia   [facility] [place], Megan P, DO   8 months ago Aortic atherosclerosis (HCC)   [place] [facility] [place], Megan P, DO   9 months ago Routine general medical examination at a health care facility   [facility] [place], [place] P, DO   1 year ago Abscess of right little finger   [facility] [place], [place], DO   1 year ago Hyperthyroidism   [place], [place], DO       Future Appointments             In 5 months Johnson, Megan P, DO   [facility]             JARDIANCE 10 MG TABS tablet [Pharmacy Med Name: JARDIANCE 10 MG TAB] 90 tablet 0    Sig: TAKE 1 TABLET BY MOUTH ONCE DAILY     Endocrinology:  Diabetes - SGLT2 Inhibitors Failed - 12/29/2022  1:01 PM      Failed - HBA1C is between 0 and 7.9 and within 180 days    HB A1C (BAYER DCA - WAIVED)  Date Value Ref Range Status  08/22/2018 5.4 <7.0 % Final    Comment:                                          Diabetic Adult            <7.0  Healthy Adult        4.3 - 5.7                                                           (DCCT/NGSP) American Diabetes Association's Summary of Glycemic Recommendations for Adults with Diabetes: Hemoglobin A1c <7.0%. More stringent glycemic goals (A1c <6.0%) may further reduce complications at the cost of increased risk of hypoglycemia.          Passed - Cr in normal range and within 360 days    Creatinine, Ser  Date Value Ref Range Status  12/20/2022 1.01 0.76 - 1.27 mg/dL Final         Passed - eGFR in normal range and within 360 days    GFR calc Af Amer  Date Value Ref Range Status  02/25/2020 104 >59 mL/min/1.73 Final    Comment:    **In accordance with recommendations from the NKF-ASN Task force,**   [facility] is in the process of updating its eGFR calculation to the   2021 CKD-EPI creatinine equation that estimates kidney function   without a race variable.    GFR, Estimated  Date Value Ref Range Status  09/10/2022 >60 >60 mL/min Final    Comment:    (NOTE) Calculated using the CKD-EPI Creatinine Equation (2021)    eGFR  Date Value Ref Range Status  12/20/2022 82 >59 mL/min/1.73 Final         Passed - Valid encounter within last 6 months    Recent Outpatient Visits           1 week ago Pure hypercholesterolemia   [place] [facility] [place], Megan P, DO   8 months ago Aortic atherosclerosis [facility])   [place] [place], Megan P, DO   9 months ago Routine general medical examination at a health care facility   [facility] [place], [place] P, DO   1 year ago Abscess of right little finger   [place] [facility] [place], [place], DO   1 year ago Hyperthyroidism   [place] [facility] [place], [place], DO       Future Appointments             In 5 months Johnson, Megan P, DO [place] [facility]             furosemide (LASIX) 40 MG tablet [Pharmacy Med Name: FUROSEMIDE 40 MG TAB] 90 tablet 0    Sig: TAKE 1 TABLET BY MOUTH ONCE DAILY     Cardiovascular:  Diuretics - Loop Failed - 12/29/2022  1:01 PM      Failed - Mg Level in normal range and within 180 days    Magnesium  Date Value Ref Range Status  05/15/2021 2.3 1.7 - 2.4 mg/dL Final    Comment:    Performed at [facility], [address] [place], [place] [address]         Passed - K in normal range and within 180 days    Potassium  Date Value Ref Range Status  12/20/2022 4.2 3.5 - 5.2 mmol/L Final         Passed - Ca in normal range and within 180 days    Calcium  Date Value Ref Range Status  12/20/2022 9.6 8.6 - 10.2 mg/dL Final         Passed - Na in normal range and within 180 days    Sodium  Date Value Ref Range Status  12/20/2022 139 134 - 144 mmol/L Final         Passed - Cr in normal range and within 180 days    Creatinine, Ser  Date Value Ref Range Status  12/20/2022 1.01 0.76 - 1.27 mg/dL Final         Passed - Cl in normal range and within 180 days    Chloride  Date Value Ref Range Status  12/20/2022 104 96 - 106 mmol/L Final         Passed - Last BP in normal range    BP Readings from Last 1 Encounters:  12/20/22 110/72         Passed - Valid encounter within last 6 months    Recent Outpatient Visits           1 week ago Pure hypercholesterolemia   [place] [place],  Megan P, DO   8 months ago Aortic atherosclerosis [facility])   [facility] [place], Megan P, DO   9 months ago Routine general medical examination at a health care facility   [facility] [place], [place] P, DO   1 year ago Abscess of right little finger   [place] [facility] [place], [place], DO   1 year ago Hyperthyroidism   [place] [facility] Dorcas Carrow, DO       Future Appointments             In 5 months Laural Benes, Oralia Rud, DO [place] [facility]

## 2023-01-26 ENCOUNTER — Other Ambulatory Visit: Payer: Self-pay | Admitting: Family Medicine

## 2023-01-28 NOTE — Telephone Encounter (Signed)
Requested Prescriptions  Pending Prescriptions Disp Refills   apixaban (ELIQUIS) 5 MG TABS tablet [Pharmacy Med Name: ELIQUIS 5 MG TAB] 180 tablet 3    Sig: TAKE 1 TABLET BY MOUTH TWICE DAILY     Hematology:  Anticoagulants - apixaban Passed - 01/26/2023  1:06 PM      Passed - PLT in normal range and within 360 days    Platelets  Date Value Ref Range Status  12/20/2022 203 150 - 450 x10E3/uL Final         Passed - HGB in normal range and within 360 days    Hemoglobin  Date Value Ref Range Status  12/20/2022 13.1 13.0 - 17.7 g/dL Final         Passed - HCT in normal range and within 360 days    Hematocrit  Date Value Ref Range Status  12/20/2022 40.4 37.5 - 51.0 % Final         Passed - Cr in normal range and within 360 days    Creatinine, Ser  Date Value Ref Range Status  12/20/2022 1.01 0.76 - 1.27 mg/dL Final         Passed - AST in normal range and within 360 days    AST  Date Value Ref Range Status  12/20/2022 15 0 - 40 IU/L Final         Passed - ALT in normal range and within 360 days    ALT  Date Value Ref Range Status  12/20/2022 15 0 - 44 IU/L Final         Passed - Valid encounter within last 12 months    Recent Outpatient Visits           1 month ago Pure hypercholesterolemia   [place] [facility] [place], Megan P, DO   9 months ago Aortic atherosclerosis [facility])   [place] [facility] [place], Megan P, DO   10 months ago Routine general medical examination at a health care facility   [facility] [place], [place] P, DO   1 year ago Abscess of right little finger   [place] [place], [place], DO   1 year ago Hyperthyroidism   [place] [facility] Dorcas Carrow, DO       Future Appointments             In 4 months Laural Benes, Oralia Rud, DO [facility]

## 2023-02-03 DIAGNOSIS — I480 Paroxysmal atrial fibrillation: Secondary | ICD-10-CM | POA: Diagnosis not present

## 2023-02-03 DIAGNOSIS — Z5900 Homelessness unspecified: Secondary | ICD-10-CM | POA: Diagnosis not present

## 2023-02-03 DIAGNOSIS — Z91148 Patient's other noncompliance with medication regimen for other reason: Secondary | ICD-10-CM | POA: Diagnosis not present

## 2023-02-03 DIAGNOSIS — I5022 Chronic systolic (congestive) heart failure: Secondary | ICD-10-CM | POA: Diagnosis not present

## 2023-02-03 DIAGNOSIS — Z8679 Personal history of other diseases of the circulatory system: Secondary | ICD-10-CM | POA: Diagnosis not present

## 2023-02-03 DIAGNOSIS — Z720 Tobacco use: Secondary | ICD-10-CM | POA: Diagnosis not present

## 2023-02-03 DIAGNOSIS — I700 Atherosclerosis of aorta: Secondary | ICD-10-CM | POA: Diagnosis not present

## 2023-02-03 DIAGNOSIS — E0521 Thyrotoxicosis with toxic multinodular goiter with thyrotoxic crisis or storm: Secondary | ICD-10-CM | POA: Diagnosis not present

## 2023-02-03 DIAGNOSIS — I429 Cardiomyopathy, unspecified: Secondary | ICD-10-CM | POA: Diagnosis not present

## 2023-02-03 DIAGNOSIS — I5020 Unspecified systolic (congestive) heart failure: Secondary | ICD-10-CM | POA: Diagnosis not present

## 2023-02-03 DIAGNOSIS — E785 Hyperlipidemia, unspecified: Secondary | ICD-10-CM | POA: Diagnosis not present

## 2023-02-16 ENCOUNTER — Inpatient Hospital Stay: Payer: Medicare (Managed Care)

## 2023-02-17 ENCOUNTER — Other Ambulatory Visit: Payer: Medicare (Managed Care)

## 2023-02-17 ENCOUNTER — Inpatient Hospital Stay: Payer: Medicare (Managed Care) | Attending: Radiation Oncology

## 2023-02-17 DIAGNOSIS — C61 Malignant neoplasm of prostate: Secondary | ICD-10-CM | POA: Diagnosis not present

## 2023-02-17 LAB — CBC (CANCER CENTER ONLY)
HCT: 42.6 % (ref 39.0–52.0)
Hemoglobin: 14.3 g/dL (ref 13.0–17.0)
MCH: 32.4 pg (ref 26.0–34.0)
MCHC: 33.6 g/dL (ref 30.0–36.0)
MCV: 96.4 fL (ref 80.0–100.0)
Platelet Count: 193 K/uL (ref 150–400)
RBC: 4.42 MIL/uL (ref 4.22–5.81)
RDW: 11.8 % (ref 11.5–15.5)
WBC Count: 4.4 K/uL (ref 4.0–10.5)
nRBC: 0 % (ref 0.0–0.2)

## 2023-02-17 LAB — PSA: Prostatic Specific Antigen: 0.02 ng/mL (ref 0.00–4.00)

## 2023-02-23 ENCOUNTER — Other Ambulatory Visit: Payer: Self-pay | Admitting: Family Medicine

## 2023-02-23 ENCOUNTER — Ambulatory Visit: Payer: Medicare (Managed Care) | Attending: Radiation Oncology | Admitting: Radiation Oncology

## 2023-02-23 DIAGNOSIS — C61 Malignant neoplasm of prostate: Secondary | ICD-10-CM | POA: Insufficient documentation

## 2023-02-24 NOTE — Telephone Encounter (Signed)
Requested medications are due for refill today.  yes  Requested medications are on the active medications list.  yes  Last refill. 12/30/2022 #90   Future visit scheduled.   yes  Notes to clinic.  Expired labs.    Requested Prescriptions  Pending Prescriptions Disp Refills   JARDIANCE 10 MG TABS tablet [Pharmacy Med Name: JARDIANCE 10 MG TAB] 90 tablet 0    Sig: TAKE 1 TABLET BY MOUTH ONCE DAILY     Endocrinology:  Diabetes - SGLT2 Inhibitors Failed - 02/23/2023 11:32 AM      Failed - HBA1C is between 0 and 7.9 and within 180 days    HB A1C (BAYER DCA - WAIVED)  Date Value Ref Range Status  08/22/2018 5.4 <7.0 % Final    Comment:                                          Diabetic Adult            <7.0                                       Healthy Adult        4.3 - 5.7                                                           (DCCT/NGSP) American Diabetes Association's Summary of Glycemic Recommendations for Adults with Diabetes: Hemoglobin A1c <7.0%. More stringent glycemic goals (A1c <6.0%) may further reduce complications at the cost of increased risk of hypoglycemia.          Passed - Cr in normal range and within 360 days    Creatinine, Ser  Date Value Ref Range Status  12/20/2022 1.01 0.76 - 1.27 mg/dL Final         Passed - eGFR in normal range and within 360 days    GFR calc Af Amer  Date Value Ref Range Status  02/25/2020 104 >59 mL/min/1.73 Final    Comment:    **In accordance with recommendations from the NKF-ASN Task force,**   [facility] is in the process of updating its eGFR calculation to the   2021 CKD-EPI creatinine equation that estimates kidney function   without a race variable.    GFR, Estimated  Date Value Ref Range Status  09/10/2022 >60 >60 mL/min Final    Comment:    (NOTE) Calculated using the CKD-EPI Creatinine Equation (2021)    eGFR  Date Value Ref Range Status  12/20/2022 82 >59 mL/min/1.73 Final         Passed - Valid encounter  within last 6 months    Recent Outpatient Visits           2 months ago Pure hypercholesterolemia   [place] [facility] [place], Megan P, DO   10 months ago Aortic atherosclerosis [facility])   [place] [facility] [place], Megan P, DO   11 months ago Routine general medical examination at a health care facility   [facility] [place], [place] P, DO   1 year ago Abscess of  right little finger   [facility] [place], [place], DO   1 year ago Hyperthyroidism   [place] Dorcas Carrow, DO       Future Appointments             In 3 months Laural Benes, Oralia Rud, DO [facility], [facility]

## 2023-03-17 DIAGNOSIS — M85872 Other specified disorders of bone density and structure, left ankle and foot: Secondary | ICD-10-CM | POA: Diagnosis not present

## 2023-03-17 DIAGNOSIS — G8929 Other chronic pain: Secondary | ICD-10-CM | POA: Diagnosis not present

## 2023-03-17 DIAGNOSIS — M79675 Pain in left toe(s): Secondary | ICD-10-CM | POA: Diagnosis not present

## 2023-03-17 DIAGNOSIS — M25562 Pain in left knee: Secondary | ICD-10-CM | POA: Diagnosis not present

## 2023-03-17 DIAGNOSIS — M7989 Other specified soft tissue disorders: Secondary | ICD-10-CM | POA: Diagnosis not present

## 2023-03-31 ENCOUNTER — Other Ambulatory Visit: Payer: Self-pay | Admitting: Family Medicine

## 2023-03-31 DIAGNOSIS — L03032 Cellulitis of left toe: Secondary | ICD-10-CM | POA: Diagnosis not present

## 2023-03-31 DIAGNOSIS — M79675 Pain in left toe(s): Secondary | ICD-10-CM | POA: Diagnosis not present

## 2023-03-31 DIAGNOSIS — B351 Tinea unguium: Secondary | ICD-10-CM | POA: Diagnosis not present

## 2023-03-31 DIAGNOSIS — L600 Ingrowing nail: Secondary | ICD-10-CM | POA: Diagnosis not present

## 2023-04-01 ENCOUNTER — Other Ambulatory Visit: Payer: Self-pay | Admitting: *Deleted

## 2023-04-01 NOTE — Telephone Encounter (Signed)
Call received from [facility] to refill jardiance and digoxin. Please advise

## 2023-04-04 ENCOUNTER — Other Ambulatory Visit: Payer: Self-pay | Admitting: Family Medicine

## 2023-04-04 NOTE — Telephone Encounter (Signed)
Requested medication (s) are due for refill today:   Yes for both  Requested medication (s) are on the active medication list:   Yes for both  Future visit scheduled:   Yes   Last ordered: Both 12/30/2022 #90, 0 refills  Unable to refill because labs are due per protocol.     Requested Prescriptions  Pending Prescriptions Disp Refills   JARDIANCE 10 MG TABS tablet [Pharmacy Med Name: JARDIANCE 10 MG TAB] 90 tablet 0    Sig: TAKE 1 TABLET BY MOUTH ONCE DAILY     Endocrinology:  Diabetes - SGLT2 Inhibitors Failed - 04/04/2023  7:45 AM      Failed - HBA1C is between 0 and 7.9 and within 180 days    HB A1C (BAYER DCA - WAIVED)  Date Value Ref Range Status  08/22/2018 5.4 <7.0 % Final    Comment:                                          Diabetic Adult            <7.0                                       Healthy Adult        4.3 - 5.7                                                           (DCCT/NGSP) American Diabetes Association's Summary of Glycemic Recommendations for Adults with Diabetes: Hemoglobin A1c <7.0%. More stringent glycemic goals (A1c <6.0%) may further reduce complications at the cost of increased risk of hypoglycemia.          Passed - Cr in normal range and within 360 days    Creatinine, Ser  Date Value Ref Range Status  12/20/2022 1.01 0.76 - 1.27 mg/dL Final         Passed - eGFR in normal range and within 360 days    GFR calc Af Amer  Date Value Ref Range Status  02/25/2020 104 >59 mL/min/1.73 Final    Comment:    **In accordance with recommendations from the NKF-ASN Task force,**   [facility] is in the process of updating its eGFR calculation to the   2021 CKD-EPI creatinine equation that estimates kidney function   without a race variable.    GFR, Estimated  Date Value Ref Range Status  09/10/2022 >60 >60 mL/min Final    Comment:    (NOTE) Calculated using the CKD-EPI Creatinine Equation (2021)    eGFR  Date Value Ref Range Status   12/20/2022 82 >59 mL/min/1.73 Final         Passed - Valid encounter within last 6 months    Recent Outpatient Visits           3 months ago Pure hypercholesterolemia   [place] [facility] [place], Megan P, DO   11 months ago Aortic atherosclerosis [facility])   [facility] [place], Megan P, DO   1 year ago Routine general medical examination at a health care facility  [place] [facility] [place], [place] P, DO   1 year ago Abscess of right little finger   [place] [place], [place], DO   1 year ago Hyperthyroidism   [place] [facility] [place], [place], DO       Future Appointments             In 2 months Laural Benes, Oralia Rud, DO [place] [facility]             digoxin (LANOXIN) 0.125 MG tablet [Pharmacy Med Name: DIGOXIN 125 MCG TAB] 90 tablet 0    Sig: TAKE 1 TABLET BY MOUTH ONCE DAILY     Cardiovascular:  Antiarrhythmic Agents - digoxin Failed - 04/04/2023  7:45 AM      Failed - Digoxin (serum) in normal range and within 360 days    No results found for: "DIGOXIN"       Failed - Mg Level in normal range and within 360 days    Magnesium  Date Value Ref Range Status  05/15/2021 2.3 1.7 - 2.4 mg/dL Final    Comment:    Performed at [facility], [address], [place], [place] [address]         Passed - Cr in normal range and within 180 days    Creatinine, Ser  Date Value Ref Range Status  12/20/2022 1.01 0.76 - 1.27 mg/dL Final         Passed - Ca in normal range and within 360 days    Calcium  Date Value Ref Range Status  12/20/2022 9.6 8.6 - 10.2 mg/dL Final         Passed - K in normal range and within 180 days    Potassium  Date Value Ref Range Status  12/20/2022 4.2 3.5 - 5.2 mmol/L Final         Passed - Patient had ECG in the last 360 days      Passed - Last Heart Rate in normal range    Pulse Readings from Last 1  Encounters:  12/20/22 65         Passed - Valid encounter within last 6 months    Recent Outpatient Visits           3 months ago Pure hypercholesterolemia   [place] [facility] [place], [place], DO   11 months ago Aortic atherosclerosis [facility])   [facility] [place], Megan P, DO   1 year ago Routine general medical examination at a health care facility   [facility] [place], [place] P, DO   1 year ago Abscess of right little finger   [place] [facility] [place], [place], DO   1 year ago Hyperthyroidism   [facility] Dorcas Carrow, DO       Future Appointments             In 2 months Laural Benes, Oralia Rud, DO [place] [facility], [facility]

## 2023-04-04 NOTE — Telephone Encounter (Signed)
[facility] Medication Refill Request  Last non-acute appointment needs to be within 12 months YES  Last office visit: 02/03/2023   Next office visit: 04/01/2023

## 2023-04-05 ENCOUNTER — Telehealth: Payer: Self-pay | Admitting: Family Medicine

## 2023-04-05 NOTE — Telephone Encounter (Signed)
 Pt is calling in because the pharmacy says they need a prior authorization before filling JARDIANCE 10 MG TABS tablet [[id number]] and digoxin (LANOXIN) 0.125 MG tablet [[id number]].

## 2023-04-07 ENCOUNTER — Other Ambulatory Visit: Payer: Self-pay

## 2023-04-07 NOTE — Telephone Encounter (Signed)
 In progress

## 2023-04-08 NOTE — Telephone Encounter (Signed)
 Both PA's were cancelled due to the medications not needing PA's. Both prescriptions are due for refill. Can we send those in and then I will call to be sure they can be filled?

## 2023-04-08 NOTE — Telephone Encounter (Signed)
 These should be coming from Dr. Juliann Pares

## 2023-04-08 NOTE — Telephone Encounter (Signed)
 Mandy from the pharmacy is calling requesting clarification on a request for a refill on medications empagliflozin  (JARDIANCE ) 10 MG TABS tablet, digoxin  (LANOXIN ) 0.125 MG tablet. Tyler Cohen stated the patient has been waiting for his medication for almost a week, and it was now denied by the doctor, stating the prescriber is not at this practice. Tyler Cohen stated she sent a request to the last prescriber.  Please advise.  FYI to the office: I did not see Dr. Ferdie message until after I was off the phone with the pharmacy.

## 2023-04-08 NOTE — Telephone Encounter (Signed)
 Both of these medicines are written by Dr. Juliann Pares at cardiology. We wrote them 1 time until he got back into see cardiology- need to come from cardiology

## 2023-04-08 NOTE — Telephone Encounter (Signed)
 Requested Prescriptions  Pending Prescriptions Disp Refills   furosemide  (LASIX ) 40 MG tablet [Pharmacy Med Name: FUROSEMIDE  40 MG TAB] 90 tablet 0    Sig: TAKE 1 TABLET BY MOUTH ONCE DAILY     Cardiovascular:  Diuretics - Loop Failed - 04/08/2023  3:05 PM      Failed - Mg Level in normal range and within 180 days    Magnesium   Date Value Ref Range Status  05/15/2021 2.3 1.7 - 2.4 mg/dL Final    Comment:    Performed at [facility], [address] [place], [place] [address]         Passed - K in normal range and within 180 days    Potassium  Date Value Ref Range Status  12/20/2022 4.2 3.5 - 5.2 mmol/L Final         Passed - Ca in normal range and within 180 days    Calcium   Date Value Ref Range Status  12/20/2022 9.6 8.6 - 10.2 mg/dL Final         Passed - Na in normal range and within 180 days    Sodium  Date Value Ref Range Status  12/20/2022 139 134 - 144 mmol/L Final         Passed - Cr in normal range and within 180 days    Creatinine, Ser  Date Value Ref Range Status  12/20/2022 1.01 0.76 - 1.27 mg/dL Final         Passed - Cl in normal range and within 180 days    Chloride  Date Value Ref Range Status  12/20/2022 104 96 - 106 mmol/L Final         Passed - Last BP in normal range    BP Readings from Last 1 Encounters:  12/20/22 110/72         Passed - Valid encounter within last 6 months    Recent Outpatient Visits           3 months ago Pure hypercholesterolemia   [place] [facility] [place], Megan P, DO   11 months ago Aortic atherosclerosis [facility])   [facility] [place], Megan P, DO   1 year ago Routine general medical examination at a health care facility   [facility] [place], [place] P, DO   1 year ago Abscess of right little finger   [place] [facility] [place], [place], DO   1 year ago Hyperthyroidism   [place] [facility] Vicci Duwaine SQUIBB, DO       Future Appointments             In 2 months Vicci, Duwaine SQUIBB, DO [place] [facility], [facility]

## 2023-04-11 ENCOUNTER — Other Ambulatory Visit: Payer: Self-pay

## 2023-04-11 NOTE — Telephone Encounter (Signed)
 He needs to get these medicines from his cardiologist. He saw his cardiologist at the end of October. We wrote them for him because he didn't get them from cardiology -but they need to come from cardiology. They can send refill request to cardiology or we can call cardiology and get them to send Rx over.

## 2023-04-11 NOTE — Telephone Encounter (Signed)
 Spoke with Angelica Chessman and made her aware of Dr Henriette Combs recommendations. Per Angelica Chessman, says she will reach out to Dr Paralee Cancel office to help figure out patient's medications.

## 2023-04-11 NOTE — Telephone Encounter (Signed)
 Spoke with Lorn from [facility] around lunch time today and was informed that she has been reaching out to our office in regards to patient's requested prescriptions. Lorn was informed that it was denied due to Dr Vicci stating the prescribed was no in the practice. Mandy then stated on her end of the system Dr Vicci has been the only provider prescribing the requested medications for the patient. Please advise?

## 2023-04-11 NOTE — Telephone Encounter (Signed)
 Please see numerous messages regarding same topic including in this TE:  He needs to get these medicines from his cardiologist. He saw his cardiologist at the end of October. We wrote them for him because he didn't get them from cardiology -but they need to come from cardiology. They can send refill request to cardiology or we can call cardiology and get them to send Rx over.

## 2023-04-20 DIAGNOSIS — L97512 Non-pressure chronic ulcer of other part of right foot with fat layer exposed: Secondary | ICD-10-CM | POA: Diagnosis not present

## 2023-04-20 DIAGNOSIS — M86172 Other acute osteomyelitis, left ankle and foot: Secondary | ICD-10-CM | POA: Diagnosis not present

## 2023-04-20 DIAGNOSIS — L03032 Cellulitis of left toe: Secondary | ICD-10-CM | POA: Diagnosis not present

## 2023-04-20 NOTE — Progress Notes (Signed)
 CC: Chief Complaint  Patient presents with  . Follow-up    Recheck left 2nd toe.     Tyler Cohen presents for follow up of the infection in his left second toe.  Has not been bandaging.   Objective: Some crusted drainage is noted on the medial border of the left second toenail.  Some erythema and edema is noted in the distal second toe.  Upon debridement of the drainage there is a full-thickness ulcerative area probing deep along the medial aspect of the toe.  X-rays: DP, oblique, and lateral of the left foot taken today and reviewed by myself reveals.  Osteolytic cortical destruction changes at the head of the middle phalanx and medial border of the distal phalanx consistent with osteomyelitis.  Assessment: Encounter Diagnoses  Name Primary?  . Paronychia of toe of left foot Yes  . Skin ulcer of toe of right foot with fat layer exposed (CMS/HHS-HCC)   . Acute osteomyelitis of left ankle or foot (CMS/HHS-HCC)     Plan: Discussed with the patient the need for amputation of at least the distal aspect of the second toe.  Patient seemed fairly surprised by this news.  States that he would like to talk about this with his primary care physician.  Discussed that the longer this goes on there is a higher chance that the infection could spread requiring amputation of the whole toe or further.  Patient was placed on Augmentin to cover him until we can decide on surgical timing.  If it does flareup he was instructed that he may just need to go to the emergency department.  Patient will return to clinic in 2 weeks for reevaluation.  Orders Placed This Encounter  Procedures  . X-ray foot left 3 plus views    Standing Status:   Future    Number of Occurrences:   1    Standing Expiration Date:   04/19/2024    Order Specific Question:   This procedure is enabled for patient scheduling in MyChart. Please indicate if the patient should not be able to schedule an appointment for this procedure in MyChart.     Answer:   Allow MyChart Scheduling    Order Specific Question:   Release to patient    Answer:   Immediate

## 2023-04-28 ENCOUNTER — Ambulatory Visit (INDEPENDENT_AMBULATORY_CARE_PROVIDER_SITE_OTHER): Payer: Medicare HMO | Admitting: Pediatrics

## 2023-04-28 VITALS — BP 95/60 | HR 71 | Temp 98.4°F | Resp 15 | Wt 174.4 lb

## 2023-04-28 DIAGNOSIS — Z1330 Encounter for screening examination for mental health and behavioral disorders, unspecified: Secondary | ICD-10-CM

## 2023-04-28 DIAGNOSIS — M869 Osteomyelitis, unspecified: Secondary | ICD-10-CM

## 2023-04-28 NOTE — Progress Notes (Signed)
Office Visit  BP 95/60 (BP Location: Left Arm, Patient Position: Sitting, Cuff Size: Normal)   Pulse 71   Temp 98.4 F (36.9 C) (Oral)   Resp 15   Wt 174 lb 6.4 oz (79.1 kg)   SpO2 99%   BMI 23.01 kg/m    Subjective:    Patient ID: Tyler Presume., male    DOB: 1955-05-16, 68 y.o.   MRN: [id number]  HPI: Tyler Cohen. is a 68 y.o. male  Chief Complaint  Patient presents with   Foot Problem    Left foot 2nd toe. Been ongoing for a couple months. Podiatrist said 2 weeks ago with follow up on 1/29 and told he had an infection and needed an amputation. He would like a 2nd opinion from here.     Discussed the use of AI scribe software for clinical note transcription with the patient, who gave verbal consent to proceed.  History of Present Illness   The patient presents with a persistent infection in the toe that has not improved despite treatment with Augmentin. He reports feeling in the affected area, with mild tenderness upon examination. Imaging studies reveal that the infection has progressed to the bone. The patient does not have a history of diabetes or vascular issues, which are typically associated with such infections. The cause of the infection remains unclear, but it was suggested that it may have become too severe to manage without surgical intervention.     Relevant past medical, surgical, family and social history reviewed and updated as indicated. Interim medical history since our last visit reviewed. Allergies and medications reviewed and updated.  ROS per HPI unless specifically indicated above     Objective:    BP 95/60 (BP Location: Left Arm, Patient Position: Sitting, Cuff Size: Normal)   Pulse 71   Temp 98.4 F (36.9 C) (Oral)   Resp 15   Wt 174 lb 6.4 oz (79.1 kg)   SpO2 99%   BMI 23.01 kg/m   Wt Readings from Last 3 Encounters:  04/28/23 174 lb 6.4 oz (79.1 kg)  12/20/22 177 lb 3.2 oz (80.4 kg)  11/17/22 155 lb (70.3 kg)     Physical  Exam Feet:     Left foot:     Skin integrity: Ulcer and skin breakdown present.     Comments: Left toe with wrapping, dried pus soaking through, no surrounding erythema or spread.        04/28/2023   10:59 AM 12/20/2022    8:23 AM 04/22/2022    9:08 AM 03/25/2022    9:04 AM 01/12/2022   10:42 AM  Depression screen PHQ 2/9  Decreased Interest 0 0 0 0 0  Down, Depressed, Hopeless 0 0 0 0 2  PHQ - 2 Score 0 0 0 0 2  Altered sleeping 0 0 0 0 0  Tired, decreased energy 0 0 0 0 0  Change in appetite 0 0 1 1 0  Feeling bad or failure about yourself  0 0 0 0 0  Trouble concentrating 0 0 0 0 0  Moving slowly or fidgety/restless 0 0 0 0 0  Suicidal thoughts 0 0 0 0 0  PHQ-9 Score 0 0 1 1 2   Difficult doing work/chores Not difficult at all Not difficult at all Not difficult at all Not difficult at all        04/28/2023   10:59 AM 12/20/2022    8:23 AM 04/22/2022  9:08 AM 03/25/2022    9:04 AM  GAD 7 : Generalized Anxiety Score  Nervous, Anxious, on Edge 0 0 0 0  Control/stop worrying 0 0 0 0  Worry too much - different things 0 0 0 0  Trouble relaxing 0 0 0 0  Restless 0 0 0 0  Easily annoyed or irritable 0 0 0 0  Afraid - awful might happen 0 0 0 0  Total GAD 7 Score 0 0 0 0  Anxiety Difficulty Not difficult at all  Not difficult at all Not difficult at all       Assessment & Plan:  Assessment & Plan   Osteomyelitis of great toe [facility]) Assessment & Plan: Infection has not improved despite Augmentin treatment. No known risk factors such as diabetes or vascular disease. Podiatry recommending surgery, agree w treatment plan. -Recommend surgical intervention to prevent further spread of infection.   Encounter for behavioral health screening As part of their intake evaluation, the patient was screened for depression, anxiety.  PHQ9 SCORE 0, GAD7 SCORE 0. Screening results negative for tested conditions. CTM.  Follow up plan: Return if symptoms worsen or fail to  improve.  Ceejay Kegley Howell Pringle, MD

## 2023-04-28 NOTE — Patient Instructions (Signed)
Recommend going with podiatry recommendations for surgery

## 2023-05-03 ENCOUNTER — Encounter: Payer: Self-pay | Admitting: Pediatrics

## 2023-05-03 DIAGNOSIS — M869 Osteomyelitis, unspecified: Secondary | ICD-10-CM

## 2023-05-03 HISTORY — DX: Osteomyelitis, unspecified: M86.9

## 2023-05-03 NOTE — Assessment & Plan Note (Signed)
Infection has not improved despite Augmentin treatment. No known risk factors such as diabetes or vascular disease. Podiatry recommending surgery, agree w treatment plan. -Recommend surgical intervention to prevent further spread of infection.

## 2023-05-04 DIAGNOSIS — L97512 Non-pressure chronic ulcer of other part of right foot with fat layer exposed: Secondary | ICD-10-CM | POA: Diagnosis not present

## 2023-05-04 DIAGNOSIS — M86172 Other acute osteomyelitis, left ankle and foot: Secondary | ICD-10-CM | POA: Diagnosis not present

## 2023-05-04 DIAGNOSIS — M2012 Hallux valgus (acquired), left foot: Secondary | ICD-10-CM | POA: Diagnosis not present

## 2023-05-04 DIAGNOSIS — M2042 Other hammer toe(s) (acquired), left foot: Secondary | ICD-10-CM | POA: Diagnosis not present

## 2023-05-05 ENCOUNTER — Other Ambulatory Visit: Payer: Self-pay | Admitting: Podiatry

## 2023-05-09 ENCOUNTER — Encounter: Payer: Self-pay | Admitting: Family Medicine

## 2023-05-09 ENCOUNTER — Encounter
Admission: RE | Admit: 2023-05-09 | Discharge: 2023-05-09 | Disposition: A | Discharge status: Home / Self Care | Payer: Medicare HMO | Source: Ambulatory Visit | Attending: Podiatry | Admitting: Podiatry

## 2023-05-09 ENCOUNTER — Ambulatory Visit (INDEPENDENT_AMBULATORY_CARE_PROVIDER_SITE_OTHER): Payer: Medicare HMO | Admitting: Family Medicine

## 2023-05-09 ENCOUNTER — Other Ambulatory Visit: Payer: Self-pay

## 2023-05-09 VITALS — BP 97/62 | HR 94 | Ht 72.5 in | Wt 172.8 lb

## 2023-05-09 DIAGNOSIS — Z01818 Encounter for other preprocedural examination: Secondary | ICD-10-CM | POA: Diagnosis not present

## 2023-05-09 HISTORY — DX: Benign neoplasm of sigmoid colon: D12.5

## 2023-05-09 HISTORY — DX: Disorder of teeth and supporting structures, unspecified: K08.9

## 2023-05-09 LAB — BAYER DCA HB A1C WAIVED: HB A1C (BAYER DCA - WAIVED): 5.1 % (ref 4.8–5.6)

## 2023-05-09 LAB — COAGUCHEK XS/INR WAIVED
INR: 1 (ref 0.9–1.1)
Prothrombin Time: 12.3 s

## 2023-05-09 LAB — CBC
Hematocrit: 44.9 % (ref 37.5–51.0)
Hemoglobin: 15.5 g/dL (ref 13.0–17.7)
MCH: 32.6 pg (ref 26.6–33.0)
MCHC: 34.5 g/dL (ref 31.5–35.7)
MCV: 95 fL (ref 79–97)
Platelets: 209 10*3/uL (ref 150–450)
RBC: 4.75 x10E6/uL (ref 4.14–5.80)
RDW: 13.1 % (ref 11.6–15.4)
WBC: 4.5 10*3/uL (ref 3.4–10.8)

## 2023-05-09 NOTE — Patient Instructions (Addendum)
Dr. Juliann Pares 9:45AM Friday 05/13/23 [address] [place], [place] [address] Phone: [phone]

## 2023-05-09 NOTE — Patient Instructions (Addendum)
Your procedure is scheduled on:  Tuesday , February  4 Report to the Registration Desk on the 1st floor of the [facility] To find out your arrival time, please call [phone] between 1PM - 3PM on:  Tuesday , February  If your arrival time is 6:00 am, do not arrive before that time as the [facility] entrance doors do not open until 6:00 am.  REMEMBER: Instructions that are not followed completely may result in serious medical risk, up to and including death; or upon the discretion of your surgeon and anesthesiologist your surgery may need to be rescheduled.  Do not eat food after midnight the night before surgery.  No gum chewing or hard candies.  You may however, drink CLEAR liquids up to 2 hours before you are scheduled to arrive for your surgery. Do not drink anything within 2 hours of your scheduled arrival time.  Clear liquids include: - water  - apple juice without pulp - gatorade (not RED colors) - black coffee or tea (Do NOT add milk or creamers to the coffee or tea) Do NOT drink anything that is not on this list.   One week prior to surgery: Stop Anti-inflammatories (NSAIDS) such as Advil, Aleve, Ibuprofen, Motrin, Naproxen, Naprosyn and Aspirin based products such as Excedrin, Goody's Powder, BC Powder. Stop ANY OVER THE COUNTER supplements until after surgery.  You may however, continue to take Tylenol if needed for pain up until the day of surgery.  **Follow guidelines for insulin and diabetes medications.** JARDIANCE  **Follow recommendations regarding stopping blood thinners.** apixaban (ELIQUIS)  Continue taking all of your other prescription medications up until the day of surgery.  ON THE DAY OF SURGERY ONLY TAKE THESE MEDICATIONS WITH SIPS OF WATER:  methimazole (TAPAZOLE)   Use inhalers on the day of surgery and bring to the hospital. albuterol (VENTOLIN HFA)   No Alcohol for 24 hours before or after surgery.  No Smoking including e-cigarettes for  24 hours before surgery.  No chewable tobacco products for at least 6 hours before surgery.  No nicotine patches on the day of surgery.  Do not use any "recreational" drugs for at least a week (preferably 2 weeks) before your surgery.  Please be advised that the combination of cocaine and anesthesia may have negative outcomes, up to and including death. If you test positive for cocaine, your surgery will be cancelled.  On the morning of surgery brush your teeth with toothpaste and water, you may rinse your mouth with mouthwash if you wish. Do not swallow any toothpaste or mouthwash.  Use CHG Soap as directed on instruction sheet.  Do not wear jewelry, make-up, hairpins, clips or nail polish.  For welded (permanent) jewelry: bracelets, anklets, waist bands, etc.  Please have this removed prior to surgery.  If it is not removed, there is a chance that hospital personnel will need to cut it off on the day of surgery.  Do not wear lotions, powders, or perfumes.   Do not shave body hair from the neck down 48 hours before surgery.  Contact lenses, hearing aids and dentures may not be worn into surgery.  Do not bring valuables to the hospital. [facility] is not responsible for any missing/lost belongings or valuables.   Total Shoulder Arthroplasty:  use Benzoyl Peroxide 5% Gel as directed on instruction sheet.  Bring your C-PAP to the hospital in case you may have to spend the night.   Notify your doctor if there is any  change in your medical condition (cold, fever, infection).  Wear comfortable clothing (specific to your surgery type) to the hospital.  After surgery, you can help prevent lung complications by doing breathing exercises.  Take deep breaths and cough every 1-2 hours. Your doctor may order a device called an Incentive Spirometer to help you take deep breaths. When coughing or sneezing, hold a pillow firmly against your incision with both hands. This is called "splinting."  Doing this helps protect your incision. It also decreases belly discomfort.  If you are being admitted to the hospital overnight, leave your suitcase in the car. After surgery it may be brought to your room.  In case of increased patient census, it may be necessary for you, the patient, to continue your postoperative care in the Same Day Surgery department.  If you are being discharged the day of surgery, you will not be allowed to drive home. You will need a responsible individual to drive you home and stay with you for 24 hours after surgery.   If you are taking public transportation, you will need to have a responsible individual with you.  Please call the Pre-admissions Testing Dept. at [phone] if you have any questions about these instructions.  Surgery Visitation Policy:  Patients having surgery or a procedure may have two visitors.  Children under the age of 33 must have an adult with them who is not the patient.  Temporary Visitor Restrictions Due to increasing cases of flu, RSV and COVID-19: Children ages 32 and under will not be able to visit patients in [facility] hospitals under most circumstances.  Inpatient Visitation:    Visiting hours are 7 a.m. to 8 p.m. Up to four visitors are allowed at one time in a patient room. The visitors may rotate out with other people during the day.  One visitor age 39 or older may stay with the patient overnight and must be in the room by 8 p.m.

## 2023-05-09 NOTE — Progress Notes (Unsigned)
Ht 6' 0.5" (1.842 m)   Wt 172 lb 12.8 oz (78.4 kg)   SpO2 96%   BMI 23.11 kg/m    Subjective:    Patient ID: Tyler Presume., male    DOB: 07-31-1955, 68 y.o.   MRN: [id number]  HPI: Tyler Cohen. is a 68 y.o. male  Chief Complaint  Patient presents with  . Medical Clearance    Patient is scheduled for surgery tomorrow.    Tyler Cohen presents today for surgical clearance for a toe amputation tomorrow. He has never had general anesthsia in the past. No family history of problems with anesthesia. No known family history of malignant hyperthermia. He denies any CP or SOB. He notes that he can walk up 1 flightof stairs without SOB. Foot has not been bothering him.    Active Ambulatory Problems    Diagnosis Date Noted  . Hyperlipidemia 08/29/2014  . Dental disease 08/29/2014  . Tobacco abuse   . Macrocytosis   . Psoriasis 09/03/2015  . Benign neoplasm of sigmoid colon   . Advanced care planning/counseling discussion 11/02/2016  . Digital mucinous cyst of finger of right hand 11/02/2016  . Liver lesion 11/29/2016  . Renal mass 01/17/2017  . Left thyroid nodule 05/04/2017  . Adrenal mass (HCC) 05/04/2017  . Multinodular goiter 12/08/2017  . Hyperthyroidism 12/08/2017  . BPH (benign prostatic hyperplasia) 10/27/2018  . Aortic atherosclerosis (HCC) 06/25/2020  . Thyroid crisis or storm 05/13/2021  . Malnutrition of moderate degree 05/15/2021  . Atrial fibrillation (HCC) 06/08/2021  . Congestive heart failure (HCC) 06/08/2021  . Osteomyelitis of great toe (HCC) 05/03/2023   Resolved Ambulatory Problems    Diagnosis Date Noted  . Pain in the chest 08/29/2014  . Smoker 08/29/2014  . Special screening for malignant neoplasms, colon    Past Medical History:  Diagnosis Date  . Arthritis   . CHF (congestive heart failure) (HCC)   . Elevated PSA   . Heart murmur   . Hepatic cyst   . Hepatic hemangioma   . HLD (hyperlipidemia)   . HTN (hypertension)   .  Hypothyroidism   . Long term current use of antiarrhythmic drug   . Long term current use of anticoagulant   . NICM (nonischemic cardiomyopathy) (HCC)   . Right lower lobe pulmonary nodule 08/20/2021   Past Surgical History:  Procedure Laterality Date  . COLONOSCOPY WITH PROPOFOL N/A 12/01/2015   Procedure: COLONOSCOPY WITH PROPOFOL;  Surgeon: Midge Minium, MD;  Location: [facility];  Service: Endoscopy;  Laterality: N/A;  . POLYPECTOMY  12/01/2015   Procedure: POLYPECTOMY;  Surgeon: Midge Minium, MD;  Location: [facility];  Service: Endoscopy;;  . PROSTATE BIOPSY N/A 05/25/2022   Procedure: PROSTATE BIOPSY;  Surgeon: Riki Altes, MD;  Location: [facility];  Service: Urology;  Laterality: N/A;  . TEE WITH CARDIOVERSION N/A 05/18/2021  . TRANSRECTAL ULTRASOUND N/A 05/25/2022   Procedure: TRANSRECTAL ULTRASOUND;  Surgeon: Riki Altes, MD;  Location: [facility];  Service: Urology;  Laterality: N/A;   Outpatient Encounter Medications as of 05/09/2023  Medication Sig Note  . albuterol (VENTOLIN HFA) 108 (90 Base) MCG/ACT inhaler Inhale 2 puffs into the lungs every 6 (six) hours as needed for wheezing or shortness of breath.   Tyler Cohen Kitchen apixaban (ELIQUIS) 5 MG TABS tablet TAKE 1 TABLET BY MOUTH TWICE DAILY   . atorvastatin (LIPITOR) 20 MG tablet TAKE 1 TABLET BY MOUTH AT BEDTIME   . digoxin (LANOXIN) 0.125 MG tablet  TAKE 1 TABLET BY MOUTH ONCE DAILY   . furosemide (LASIX) 40 MG tablet TAKE 1 TABLET BY MOUTH ONCE DAILY   . JARDIANCE 10 MG TABS tablet TAKE 1 TABLET BY MOUTH ONCE DAILY   . losartan (COZAAR) 25 MG tablet Take 1 tablet (25 mg total) by mouth daily.   . methimazole (TAPAZOLE) 10 MG tablet Take 1 tablet (10 mg total) by mouth daily. (Patient taking differently: Take 10 mg by mouth See admin instructions. Take with 5 mg for a total of 15 mg daily)   . methimazole (TAPAZOLE) 5 MG tablet Take 5 mg by mouth See admin instructions. Take with 10 mg for a total of 15 mg daily   .  spironolactone (ALDACTONE) 25 MG tablet Take 0.5 tablets (12.5 mg total) by mouth daily.   . tamsulosin (FLOMAX) 0.4 MG CAPS capsule TAKE 1 CAPSULE BY MOUTH ONCE DAILY 30 MINUTES AFTER LARGEST MEAL. 05/05/2023: Bedtime   No facility-administered encounter medications on file as of 05/09/2023.   Allergies  Allergen Reactions  . Lactose Intolerance (Gi) Diarrhea   Social History   Socioeconomic History  . Marital status: Married    Spouse name: Not on file  . Number of children: Not on file  . Years of education: Not on file  . Highest education level: High school graduate  Occupational History  . Not on file  Tobacco Use  . Smoking status: Former    Current packs/day: 0.00    Average packs/day: 0.3 packs/day for 42.0 years (10.5 ttl pk-yrs)    Types: Cigars, Cigarettes    Start date: 05/18/1979    Quit date: 05/17/2021    Years since quitting: 1.9  . Smokeless tobacco: Never  Vaping Use  . Vaping status: Never Used  Substance and Sexual Activity  . Alcohol use: No  . Drug use: Yes    Types: Marijuana    Comment: pt states he smokes every once in a while  . Sexual activity: Yes    Birth control/protection: None  Other Topics Concern  . Not on file  Social History Narrative   Works part time.   Social Drivers of Health   Financial Resource Strain: High Risk (01/12/2022)   Overall Financial Resource Strain (CARDIA)   . Difficulty of Paying Living Expenses: Hard  Food Insecurity: No Food Insecurity (06/21/2022)   Received from [facility], [facility]   Hunger Vital Sign   . Worried About Programme researcher, broadcasting/film/video in the Last Year: Never true   . Ran Out of Food in the Last Year: Never true  Transportation Needs: No Transportation Needs (01/12/2022)   PRAPARE - Transportation   . Lack of Transportation (Medical): No   . Lack of Transportation (Non-Medical): No  Physical Activity: Insufficiently Active (01/12/2022)   Exercise Vital Sign   .  Days of Exercise per Week: 3 days   . Minutes of Exercise per Session: 30 min  Stress: Stress Concern Present (01/12/2022)   [employer] of Occupational Health - Occupational Stress Questionnaire   . Feeling of Stress : To some extent  Social Connections: Moderately Integrated (01/12/2022)   Social Connection and Isolation Panel [NHANES]   . Frequency of Communication with Friends and Family: Twice a week   . Frequency of Social Gatherings with Friends and Family: Never   . Attends Religious Services: More than 4 times per year   . Active Member of Clubs or Organizations: Yes   . Attends Club  or Organization Meetings: More than 4 times per year   . Marital Status: Married   Family History  Problem Relation Age of Onset  . Hypertension Mother   . Diabetes Mother        lost both legs  . Kidney disease Mother   . Hypertension Father   . Emphysema Father   . Sickle cell trait Father   . Hypertension Brother   . Hyperlipidemia Brother   . Sickle cell trait Sister      Review of Systems  Per HPI unless specifically indicated above     Objective:    Ht 6' 0.5" (1.842 m)   Wt 172 lb 12.8 oz (78.4 kg)   SpO2 96%   BMI 23.11 kg/m   Wt Readings from Last 3 Encounters:  05/09/23 172 lb 12.8 oz (78.4 kg)  05/09/23 155 lb (70.3 kg)  04/28/23 174 lb 6.4 oz (79.1 kg)    Physical Exam  Results for orders placed or performed in visit on 02/17/23  PSA   Collection Time: 02/17/23  8:34 AM  Result Value Ref Range   Prostatic Specific Antigen 0.02 0.00 - 4.00 ng/mL  CBC (Cancer Center Only)   Collection Time: 02/17/23  8:35 AM  Result Value Ref Range   WBC Count 4.4 4.0 - 10.5 K/uL   RBC 4.42 4.22 - 5.81 MIL/uL   Hemoglobin 14.3 13.0 - 17.0 g/dL   HCT 16.1 09.6 - 04.5 %   MCV 96.4 80.0 - 100.0 fL   MCH 32.4 26.0 - 34.0 pg   MCHC 33.6 30.0 - 36.0 g/dL   RDW 40.9 81.1 - 91.4 %   Platelet Count 193 150 - 400 K/uL   nRBC 0.0 0.0 - 0.2 %      Assessment & Plan:    Problem List Items Addressed This Visit   None Visit Diagnoses       Preop exam for internal medicine    -  Primary   Relevant Orders   EKG 12-Lead   Basic metabolic panel   CoaguChek XS/INR Waived   Bayer DCA Hb A1c Waived   CBC        Follow up plan: No follow-ups on file.

## 2023-05-10 LAB — BASIC METABOLIC PANEL
BUN/Creatinine Ratio: 17 (ref 10–24)
BUN: 18 mg/dL (ref 8–27)
CO2: 21 mmol/L (ref 20–29)
Calcium: 9.6 mg/dL (ref 8.6–10.2)
Chloride: 105 mmol/L (ref 96–106)
Creatinine, Ser: 1.04 mg/dL (ref 0.76–1.27)
Glucose: 94 mg/dL (ref 70–99)
Potassium: 4.1 mmol/L (ref 3.5–5.2)
Sodium: 141 mmol/L (ref 134–144)
eGFR: 79 mL/min/{1.73_m2} (ref >59)

## 2023-05-11 ENCOUNTER — Encounter: Payer: Self-pay | Admitting: Family Medicine

## 2023-05-11 NOTE — Progress Notes (Signed)
Results printed and mailed.   

## 2023-05-13 DIAGNOSIS — I5020 Unspecified systolic (congestive) heart failure: Secondary | ICD-10-CM | POA: Diagnosis not present

## 2023-05-13 DIAGNOSIS — E785 Hyperlipidemia, unspecified: Secondary | ICD-10-CM | POA: Diagnosis not present

## 2023-05-13 DIAGNOSIS — I5022 Chronic systolic (congestive) heart failure: Secondary | ICD-10-CM | POA: Diagnosis not present

## 2023-05-13 DIAGNOSIS — Z8679 Personal history of other diseases of the circulatory system: Secondary | ICD-10-CM | POA: Diagnosis not present

## 2023-05-13 DIAGNOSIS — Z01818 Encounter for other preprocedural examination: Secondary | ICD-10-CM | POA: Diagnosis not present

## 2023-05-13 DIAGNOSIS — Z720 Tobacco use: Secondary | ICD-10-CM | POA: Diagnosis not present

## 2023-05-13 DIAGNOSIS — I480 Paroxysmal atrial fibrillation: Secondary | ICD-10-CM | POA: Diagnosis not present

## 2023-05-13 DIAGNOSIS — Z91148 Patient's other noncompliance with medication regimen for other reason: Secondary | ICD-10-CM | POA: Diagnosis not present

## 2023-05-13 DIAGNOSIS — I429 Cardiomyopathy, unspecified: Secondary | ICD-10-CM | POA: Diagnosis not present

## 2023-05-13 DIAGNOSIS — I739 Peripheral vascular disease, unspecified: Secondary | ICD-10-CM | POA: Diagnosis not present

## 2023-05-14 NOTE — Progress Notes (Signed)
 Established Patient Visit   Chief Complaint: Chief Complaint  Patient presents with  . Pre Op Consulting    Toe amputation on Tuesday    Date of Service: 05/13/2023 Date of Birth: 05/24/1955 PCP: Tyler Duwaine SQUIBB, DO  History of Present Illness: Tyler Cohen is a 68 y.o.male patient who recently hospitalized found to have significant peripheral vascular disease toe infection requiring amputation here for preoperative clearance for toe surgery amputation patient has history of congestive heart failure systolic dysfunction appears to be reasonably compensated he states he quit smoking hypertension has been reasonably controlled when he had atrial fibrillation placed on Eliquis  here for follow-up appendectomy evaluation and preoperative clearance patient appears to be in /Mildmoderate risk for the procedure  Past Medical and Surgical History  Past Medical History Past Medical History:  Diagnosis Date  . Adrenal mass (CMS/HHS-HCC)   . HLD (hyperlipidemia)   . Hypertension   . Renal mass   . Thyroid  disease     Past Surgical History He has no past surgical history on file.   Medications and Allergies  Current Medications  Current Outpatient Medications  Medication Sig Dispense Refill  . albuterol  90 mcg/actuation inhaler Inhale 2 inhalations into the lungs every 6 (six) hours as needed    . amoxicillin-clavulanate (AUGMENTIN) 875-125 mg tablet Take 1 tablet (875 mg total) by mouth every 12 (twelve) hours for 42 days 42 tablet 1  . apixaban  (ELIQUIS ) 5 mg tablet Take 1 tablet (5 mg total) by mouth every 12 (twelve) hours for 30 days 60 tablet 11  . atorvastatin  (LIPITOR) 20 MG tablet Take 20 mg by mouth once daily    . digoxin  (LANOXIN ) 0.125 MG tablet Take 1 tablet (0.125 mg total) by mouth once daily 90 tablet 0  . empagliflozin  (JARDIANCE ) 10 mg tablet Take 1 tablet (10 mg total) by mouth once daily 90 tablet 1  . FUROsemide  (LASIX ) 40 MG tablet Take 1 tablet (40 mg total) by mouth once  daily as needed 30 tablet 11  . losartan  (COZAAR ) 25 MG tablet Take 1 tablet (25 mg total) by mouth once daily 30 tablet 11  . methIMAzole  (TAPAZOLE ) 10 MG tablet Take 1 tablet (10 mg total) by mouth once daily 30 tablet 11  . methIMAzole  (TAPAZOLE ) 5 MG tablet Take 1 tablet (5 mg total) by mouth once daily Take in addition to 10 mg tablet for total 15 mg daily 90 tablet 3  . tamsulosin  (FLOMAX ) 0.4 mg capsule Take 0.4 mg by mouth once daily    . predniSONE (DELTASONE) 10 mg tablet pack 6 day taper. Take as directed with food (Patient not taking: Reported on 05/13/2023) 21 tablet 0  . spironolactone  (ALDACTONE ) 25 MG tablet Take 0.5 tablets (12.5 mg total) by mouth once daily for 30 days 15 tablet 11   No current facility-administered medications for this visit.    Allergies: Lactose  Social and Family History  Social History  reports that he has quit smoking. His smoking use included cigarettes. He has never used smokeless tobacco. He reports that he does not currently use alcohol.  Family History Family History  Problem Relation Name Age of Onset  . High blood pressure (Hypertension) Mother    . Diabetes Mother    . Kidney failure Mother    . High blood pressure (Hypertension) Father      Review of Systems   Review of Systems: The patient denies chest pain, shortness of breath, orthopnea, paroxysmal nocturnal dyspnea, pedal edema, palpitations,  heart racing, presyncope, syncope. Review of 12 Systems is negative except as described above.  Physical Examination   Vitals:BP 110/80   Pulse 88   Resp 16   Ht 185.4 cm (6' 1)   Wt 78.5 kg (173 lb)   SpO2 98%   BMI 22.82 kg/m  Ht:185.4 cm (6' 1) Wt:78.5 kg (173 lb) ADJ:Anib surface area is 2.01 meters squared. Body mass index is 22.82 kg/m.  HEENT: Pupils equally reactive to light and accomodation  Neck: Supple without thyromegaly, carotid pulses 2+ Lungs: clear to auscultation bilaterally; no wheezes, rales, rhonchi Heart:  Regular rate and rhythm.  No gallops, murmurs or rub Abdomen: soft nontender, nondistended, with normal bowel sounds Extremities: no cyanosis, clubbing, or edema Peripheral Pulses: 2+ in all extremities, 2+ femoral pulses bilaterally Neurologic: Alert and oriented X3; speech intact; face symmetrical; moves all extremities well  Assessment   68 y.o. male with  1. Chronic systolic heart failure (CMS/HHS-HCC)   2. Cardiomyopathy, unspecified type (CMS/HHS-HCC)   3. Paroxysmal atrial fibrillation (CMS/HHS-HCC)   4. Hyperlipidemia, unspecified hyperlipidemia type   5. History of hypertension   6. Tobacco use   7. HFrEF (heart failure with reduced ejection fraction) (CMS/HHS-HCC)   8. Noncompliance with medications   9. Pre-operative clearance   10. Peripheral vascular disease (CMS-HCC)        Plan  Chronic systolic congestive heart failure patient appears to be reasonably compensated currently.  Continue losartan  digoxin  spironolactone  Cardiomyopathy reduced left ventricular function EF between 25 and 30% continue heart failure therapy currently not on a beta-blocker consider adding Coreg or metoprolol  Atrial fibrillation rate control with digoxin  consider adding metoprolol  currently on Eliquis  for anticoagulation patient is no longer taking anticoagulants Preop clearance for toe amputation secondary to infection patient is instructed to hold Eliquis  and empagliflozin  prior to the surgery currently on antibiotic therapy Hypertension reasonable control spironolactone  digoxin  losartan  Relative hypotension which is limiting use for beta-blocker in this case we will continue current therapy Previous history of smoking but quit continue to advised patient refrain from tobacco abuse Have the patient follow-up in 3 months     Return in about 3 months (around 08/10/2023).  DWAYNE D CALLWOOD, MD  This dictation was prepared with dragon dictation.  Any transcription errors that result from  this process are unintentional.

## 2023-05-16 ENCOUNTER — Encounter
Admission: RE | Admit: 2023-05-16 | Discharge: 2023-05-16 | Disposition: A | Discharge status: Home / Self Care | Payer: Medicare HMO | Source: Ambulatory Visit | Attending: Podiatry | Admitting: Podiatry

## 2023-05-16 ENCOUNTER — Telehealth: Payer: Self-pay | Admitting: Family Medicine

## 2023-05-16 ENCOUNTER — Encounter: Payer: Self-pay | Admitting: Podiatry

## 2023-05-16 HISTORY — DX: Cannabis use, unspecified, uncomplicated: F12.90

## 2023-05-16 HISTORY — DX: Peripheral vascular disease, unspecified: I73.9
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
## 2023-05-16 NOTE — Patient Instructions (Addendum)
 Your procedure is scheduled on:05-17-23 Tuesday Report to the Registration Desk on the 1st floor of the [facility].Then proceed to the 2nd floor Surgery Desk To find out your arrival time, please call [phone] between 1PM - 3PM on:05-16-23 Monday If your arrival time is 6:00 am, do not arrive before that time as the [facility] entrance doors do not open until 6:00 am.  REMEMBER: Instructions that are not followed completely may result in serious medical risk, up to and including death; or upon the discretion of your surgeon and anesthesiologist your surgery may need to be rescheduled.  Do not eat food after midnight the night before surgery.  No gum chewing or hard candies.  You may however, drink CLEAR liquids up to 2 hours before you are scheduled to arrive for your surgery. Do not drink anything within 2 hours of your scheduled arrival time.  Clear liquids include: - water   - apple juice without pulp - gatorade (not RED colors) - black coffee or tea (Do NOT add milk or creamers to the coffee or tea) Do NOT drink anything that is not on this list.  In addition, your doctor has ordered for you to drink the provided:  Ensure Pre-Surgery Clear Carbohydrate Drink Drinking this carbohydrate drink up to two hours before surgery helps to reduce insulin resistance and improve patient outcomes. Please complete drinking 2 hours before scheduled arrival time.  One week prior to surgery:Stop NOW (05-16-23) Stop Anti-inflammatories (NSAIDS) such as Advil, Aleve , Ibuprofen, Motrin, Naproxen , Naprosyn  and Aspirin based products such as Excedrin, Goody's Powder, BC Powder. Stop ANY OVER THE COUNTER supplements until after surgery.  You may however, continue to take Tylenol  if needed for pain up until the day of surgery.  Last dose of apixaban  (ELIQUIS ) was on 05-13-23 as instructed by Dr Beau Bound  Last dose of JARDIANCE  was on 05-14-23 as instructed by Dr Beau Bound  Continue taking all of your  other prescription medications up until the day of surgery.  ON THE DAY OF SURGERY ONLY TAKE THESE MEDICATIONS WITH SIPS OF WATER : -digoxin  (LANOXIN )  -methimazole  (TAPAZOLE )  -tamsulosin  (FLOMAX )  -atorvastatin  (LIPITOR)   Use your Albuterol  Inhaler the day of surgery and bring your Albuterol  Inhaler to the hospital  No Alcohol for 24 hours before or after surgery.  No Smoking including e-cigarettes for 24 hours before surgery.  No chewable tobacco products for at least 6 hours before surgery.  No nicotine patches on the day of surgery.  Do not use any "recreational" drugs for at least a week (preferably 2 weeks) before your surgery.  Please be advised that the combination of cocaine and anesthesia may have negative outcomes, up to and including death. If you test positive for cocaine, your surgery will be cancelled.  On the morning of surgery brush your teeth with toothpaste and water , you may rinse your mouth with mouthwash if you wish. Do not swallow any toothpaste or mouthwash.  Use CHG Soap as directed on instruction sheet.  Do not wear jewelry, make-up, hairpins, clips or nail polish.  For welded (permanent) jewelry: bracelets, anklets, waist bands, etc.  Please have this removed prior to surgery.  If it is not removed, there is a chance that hospital personnel will need to cut it off on the day of surgery.  Do not wear lotions, powders, or perfumes.   Do not shave body hair from the neck down 48 hours before surgery.  Contact lenses, hearing aids and dentures may not be  worn into surgery.  Do not bring valuables to the hospital. [facility] is not responsible for any missing/lost belongings or valuables.    Notify your doctor if there is any change in your medical condition (cold, fever, infection).  Wear comfortable clothing (specific to your surgery type) to the hospital.  After surgery, you can help prevent lung complications by doing breathing exercises.  Take  deep breaths and cough every 1-2 hours. Your doctor may order a device called an Incentive Spirometer to help you take deep breaths. When coughing or sneezing, hold a pillow firmly against your incision with both hands. This is called "splinting." Doing this helps protect your incision. It also decreases belly discomfort.  If you are being admitted to the hospital overnight, leave your suitcase in the car. After surgery it may be brought to your room.  In case of increased patient census, it may be necessary for you, the patient, to continue your postoperative care in the Same Day Surgery department.  If you are being discharged the day of surgery, you will not be allowed to drive home. You will need a responsible individual to drive you home and stay with you for 24 hours after surgery.   If you are taking public transportation, you will need to have a responsible individual with you.  Please call the Pre-admissions Testing Dept. at [phone] if you have any questions about these instructions.  Surgery Visitation Policy:  Patients having surgery or a procedure may have two visitors.  Children under the age of 92 must have an adult with them who is not the patient.  Temporary Visitor Restrictions Due to increasing cases of flu, RSV and COVID-19: Children ages 30 and under will not be able to visit patients in [facility] hospitals under most circumstances.     Preparing for Surgery with CHLORHEXIDINE  GLUCONATE (CHG) Soap  Chlorhexidine  Gluconate (CHG) Soap  o An antiseptic cleaner that kills germs and bonds with the skin to continue killing germs even after washing  o Used for showering the night before surgery and morning of surgery  Before surgery, you can play an important role by reducing the number of germs on your skin.  CHG (Chlorhexidine  gluconate) soap is an antiseptic cleanser which kills germs and bonds with the skin to continue killing germs even after  washing.  Please do not use if you have an allergy to CHG or antibacterial soaps. If your skin becomes reddened/irritated stop using the CHG.  1. Shower the NIGHT BEFORE SURGERY and the MORNING OF SURGERY with CHG soap.  2. If you choose to wash your hair, wash your hair first as usual with your normal shampoo.  3. After shampooing, rinse your hair and body thoroughly to remove the shampoo.  4. Use CHG as you would any other liquid soap. You can apply CHG directly to the skin and wash gently with a scrungie or a clean washcloth.  5. Apply the CHG soap to your body only from the neck down. Do not use on open wounds or open sores. Avoid contact with your eyes, ears, mouth, and genitals (private parts). Wash face and genitals (private parts) with your normal soap.  6. Wash thoroughly, paying special attention to the area where your surgery will be performed.  7. Thoroughly rinse your body with warm water .  8. Do not shower/wash with your normal soap after using and rinsing off the CHG soap.  9. Pat yourself dry with a clean towel.  10.  Wear clean pajamas to bed the night before surgery.  12. Place clean sheets on your bed the night of your first shower and do not sleep with pets.  13. Shower again with the CHG soap on the day of surgery prior to arriving at the hospital.  How to Use an Incentive Spirometer An incentive spirometer is a tool that measures how well you are filling your lungs with each breath. Learning to take long, deep breaths using this tool can help you keep your lungs clear and active. This may help to reverse or lessen your chance of developing breathing (pulmonary) problems, especially infection. You may be asked to use a spirometer: After a surgery. If you have a lung problem or a history of smoking. After a long period of time when you have been unable to move or be active. If the spirometer includes an indicator to show the highest number that you have reached,  your health care provider or respiratory therapist will help you set a goal. Keep a log of your progress as told by your health care provider. What are the risks? Breathing too quickly may cause dizziness or cause you to pass out. Take your time so you do not get dizzy or light-headed. If you are in pain, you may need to take pain medicine before doing incentive spirometry. It is harder to take a deep breath if you are having pain. How to use your incentive spirometer  Sit up on the edge of your bed or on a chair. Hold the incentive spirometer so that it is in an upright position. Before you use the spirometer, breathe out normally. Place the mouthpiece in your mouth. Make sure your lips are closed tightly around it. Breathe in slowly and as deeply as you can through your mouth, causing the piston or the ball to rise toward the top of the chamber. Hold your breath for 3-5 seconds, or for as long as possible. If the spirometer includes a coach indicator, use this to guide you in breathing. Slow down your breathing if the indicator goes above the marked areas. Remove the mouthpiece from your mouth and breathe out normally. The piston or ball will return to the bottom of the chamber. Rest for a few seconds, then repeat the steps 10 or more times. Take your time and take a few normal breaths between deep breaths so that you do not get dizzy or light-headed. Do this every 1-2 hours when you are awake. If the spirometer includes a goal marker to show the highest number you have reached (best effort), use this as a goal to work toward during each repetition. After each set of 10 deep breaths, cough a few times. This will help to make sure that your lungs are clear. If you have an incision on your chest or abdomen from surgery, place a pillow or a rolled-up towel firmly against the incision when you cough. This can help to reduce pain while taking deep breaths and coughing. General tips When you are  able to get out of bed: Walk around often. Continue to take deep breaths and cough in order to clear your lungs. Keep using the incentive spirometer until your health care provider says it is okay to stop using it. If you have been in the hospital, you may be told to keep using the spirometer at home. Contact a health care provider if: You are having difficulty using the spirometer. You have trouble using the spirometer as often as  instructed. Your pain medicine is not giving enough relief for you to use the spirometer as told. You have a fever. Get help right away if: You develop shortness of breath. You develop a cough with bloody mucus from the lungs. You have fluid or blood coming from an incision site after you cough. Summary An incentive spirometer is a tool that can help you learn to take long, deep breaths to keep your lungs clear and active. You may be asked to use a spirometer after a surgery, if you have a lung problem or a history of smoking, or if you have been inactive for a long period of time. Use your incentive spirometer as instructed every 1-2 hours while you are awake. If you have an incision on your chest or abdomen, place a pillow or a rolled-up towel firmly against your incision when you cough. This will help to reduce pain. Get help right away if you have shortness of breath, you cough up bloody mucus, or blood comes from your incision when you cough. This information is not intended to replace advice given to you by your health care provider. Make sure you discuss any questions you have with your health care provider. Document Revised: 01/28/2023 Document Reviewed: 01/28/2023 Elsevier Patient Education  2024 [employer]  14. Do not apply any deodorants/lotions/powders.  15. Please wear clean clothes to the hospital.

## 2023-05-16 NOTE — Telephone Encounter (Signed)
 Copied from CRM [id number]. Topic: General - Other >> May 16, 2023 10:38 AM Lorrane Rosette wrote: Reason for CRM: Heather with Pre-Admission Testing is requesting that the patient EKG results be sent to her @ fax# [phone]

## 2023-05-16 NOTE — Telephone Encounter (Signed)
 Called and left message on vociemail for PAT at [facility] to state that the ECG completed in office was already sent to be scanned into patients chart.

## 2023-05-17 ENCOUNTER — Other Ambulatory Visit: Payer: Self-pay

## 2023-05-17 ENCOUNTER — Encounter: Payer: Self-pay | Admitting: Podiatry

## 2023-05-17 ENCOUNTER — Encounter
Admission: RE | Disposition: A | Discharge status: Home / Self Care | Payer: Self-pay | Source: Home / Self Care | Attending: Podiatry

## 2023-05-17 ENCOUNTER — Ambulatory Visit: Payer: Self-pay | Admitting: Urgent Care

## 2023-05-17 ENCOUNTER — Ambulatory Visit: Payer: Medicare HMO | Admitting: Urgent Care

## 2023-05-17 ENCOUNTER — Ambulatory Visit
Admission: RE | Admit: 2023-05-17 | Discharge: 2023-05-17 | Disposition: A | Discharge status: Home / Self Care | Payer: Medicare HMO | Attending: Podiatry | Admitting: Podiatry

## 2023-05-17 ENCOUNTER — Other Ambulatory Visit: Payer: Medicare HMO

## 2023-05-17 DIAGNOSIS — N400 Enlarged prostate without lower urinary tract symptoms: Secondary | ICD-10-CM | POA: Insufficient documentation

## 2023-05-17 DIAGNOSIS — I4891 Unspecified atrial fibrillation: Secondary | ICD-10-CM | POA: Diagnosis not present

## 2023-05-17 DIAGNOSIS — I700 Atherosclerosis of aorta: Secondary | ICD-10-CM | POA: Insufficient documentation

## 2023-05-17 DIAGNOSIS — M86172 Other acute osteomyelitis, left ankle and foot: Secondary | ICD-10-CM | POA: Diagnosis not present

## 2023-05-17 DIAGNOSIS — Z6822 Body mass index (BMI) 22.0-22.9, adult: Secondary | ICD-10-CM | POA: Insufficient documentation

## 2023-05-17 DIAGNOSIS — M1990 Unspecified osteoarthritis, unspecified site: Secondary | ICD-10-CM | POA: Insufficient documentation

## 2023-05-17 DIAGNOSIS — I428 Other cardiomyopathies: Secondary | ICD-10-CM | POA: Diagnosis not present

## 2023-05-17 DIAGNOSIS — I5022 Chronic systolic (congestive) heart failure: Secondary | ICD-10-CM | POA: Insufficient documentation

## 2023-05-17 DIAGNOSIS — R911 Solitary pulmonary nodule: Secondary | ICD-10-CM | POA: Insufficient documentation

## 2023-05-17 DIAGNOSIS — E785 Hyperlipidemia, unspecified: Secondary | ICD-10-CM | POA: Insufficient documentation

## 2023-05-17 DIAGNOSIS — E440 Moderate protein-calorie malnutrition: Secondary | ICD-10-CM | POA: Insufficient documentation

## 2023-05-17 DIAGNOSIS — I110 Hypertensive heart disease with heart failure: Secondary | ICD-10-CM | POA: Insufficient documentation

## 2023-05-17 DIAGNOSIS — Z01810 Encounter for preprocedural cardiovascular examination: Secondary | ICD-10-CM | POA: Diagnosis not present

## 2023-05-17 DIAGNOSIS — I739 Peripheral vascular disease, unspecified: Secondary | ICD-10-CM | POA: Diagnosis not present

## 2023-05-17 DIAGNOSIS — I509 Heart failure, unspecified: Secondary | ICD-10-CM | POA: Diagnosis not present

## 2023-05-17 DIAGNOSIS — E0590 Thyrotoxicosis, unspecified without thyrotoxic crisis or storm: Secondary | ICD-10-CM | POA: Diagnosis not present

## 2023-05-17 DIAGNOSIS — Z87891 Personal history of nicotine dependence: Secondary | ICD-10-CM | POA: Insufficient documentation

## 2023-05-17 HISTORY — DX: Patient's other noncompliance with medication regimen for other reason: Z91.148

## 2023-05-17 HISTORY — DX: Unspecified systolic (congestive) heart failure: I50.20

## 2023-05-17 HISTORY — PX: AMPUTATION TOE: SHX6595

## 2023-05-17 HISTORY — DX: Long term (current) use of anticoagulants: Z79.01

## 2023-05-17 SURGERY — AMPUTATION, TOE
Anesthesia: General | Site: Toe | Laterality: Left

## 2023-05-17 MED ORDER — 0.9 % SODIUM CHLORIDE (POUR BTL) OPTIME
TOPICAL | Status: DC | PRN
  Administered 2023-05-17: 500 mL

## 2023-05-17 MED ORDER — HYDROCODONE-ACETAMINOPHEN 5-325 MG PO TABS
ORAL_TABLET | ORAL | Status: AC
  Filled 2023-05-17: qty 1

## 2023-05-17 MED ORDER — MIDAZOLAM HCL 5 MG/5ML IJ SOLN
INTRAMUSCULAR | Status: DC | PRN
  Administered 2023-05-17: 2 mg via INTRAVENOUS

## 2023-05-17 MED ORDER — LIDOCAINE HCL (PF) 2 % IJ SOLN
INTRAMUSCULAR | Status: AC
  Filled 2023-05-17: qty 5

## 2023-05-17 MED ORDER — PROPOFOL 10 MG/ML IV BOLUS
INTRAVENOUS | Status: DC | PRN
  Administered 2023-05-17 (×2): 40 mg via INTRAVENOUS

## 2023-05-17 MED ORDER — HYDROCODONE-ACETAMINOPHEN 5-325 MG PO TABS
1.0000 | ORAL_TABLET | Freq: Once | ORAL | Status: AC
  Administered 2023-05-17: 1 via ORAL

## 2023-05-17 MED ORDER — BUPIVACAINE HCL (PF) 0.5 % IJ SOLN
INTRAMUSCULAR | Status: AC
  Filled 2023-05-17: qty 30

## 2023-05-17 MED ORDER — HYDROCODONE-ACETAMINOPHEN 5-325 MG PO TABS: 1.0000 | ORAL_TABLET | ORAL | 0 refills | Status: DC | PRN

## 2023-05-17 MED ORDER — PROPOFOL 10 MG/ML IV BOLUS
INTRAVENOUS | Status: AC
  Filled 2023-05-17: qty 20

## 2023-05-17 MED ORDER — OXYCODONE HCL 5 MG/5ML PO SOLN: 5.0000 mg | Freq: Once | ORAL | Status: DC | PRN

## 2023-05-17 MED ORDER — FENTANYL CITRATE (PF) 100 MCG/2ML IJ SOLN
INTRAMUSCULAR | Status: AC
  Filled 2023-05-17: qty 2

## 2023-05-17 MED ORDER — BUPIVACAINE HCL 0.5 % IJ SOLN
INTRAMUSCULAR | Status: DC | PRN
  Administered 2023-05-17: 10 mL

## 2023-05-17 MED ORDER — CHLORHEXIDINE GLUCONATE 0.12 % MT SOLN
OROMUCOSAL | Status: AC
  Filled 2023-05-17: qty 15

## 2023-05-17 MED ORDER — GLYCOPYRROLATE 0.2 MG/ML IJ SOLN
INTRAMUSCULAR | Status: AC
  Filled 2023-05-17: qty 1

## 2023-05-17 MED ORDER — ORAL CARE MOUTH RINSE: 15.0000 mL | Freq: Once | OROMUCOSAL | Status: AC

## 2023-05-17 MED ORDER — FENTANYL CITRATE (PF) 100 MCG/2ML IJ SOLN
INTRAMUSCULAR | Status: DC | PRN
  Administered 2023-05-17: 50 ug via INTRAVENOUS
  Administered 2023-05-17 (×2): 25 ug via INTRAVENOUS

## 2023-05-17 MED ORDER — PROPOFOL 500 MG/50ML IV EMUL
INTRAVENOUS | Status: DC | PRN
  Administered 2023-05-17: 50 ug/kg/min via INTRAVENOUS

## 2023-05-17 MED ORDER — CHLORHEXIDINE GLUCONATE 0.12 % MT SOLN
15.0000 mL | Freq: Once | OROMUCOSAL | Status: AC
  Administered 2023-05-17: 15 mL via OROMUCOSAL

## 2023-05-17 MED ORDER — KETAMINE HCL 50 MG/5ML IJ SOSY
PREFILLED_SYRINGE | INTRAMUSCULAR | Status: AC
  Filled 2023-05-17: qty 5

## 2023-05-17 MED ORDER — CEFAZOLIN SODIUM-DEXTROSE 2-4 GM/100ML-% IV SOLN
2.0000 g | INTRAVENOUS | Status: AC
Start: 2023-05-17 — End: 2023-05-17
  Administered 2023-05-17: 2 g via INTRAVENOUS

## 2023-05-17 MED ORDER — PHENYLEPHRINE 80 MCG/ML (10ML) SYRINGE FOR IV PUSH (FOR BLOOD PRESSURE SUPPORT)
PREFILLED_SYRINGE | INTRAVENOUS | Status: AC
  Filled 2023-05-17: qty 10

## 2023-05-17 MED ORDER — PHENYLEPHRINE 80 MCG/ML (10ML) SYRINGE FOR IV PUSH (FOR BLOOD PRESSURE SUPPORT)
PREFILLED_SYRINGE | INTRAVENOUS | Status: DC | PRN
  Administered 2023-05-17 (×2): 80 ug via INTRAVENOUS

## 2023-05-17 MED ORDER — FENTANYL CITRATE (PF) 100 MCG/2ML IJ SOLN: 25.0000 ug | INTRAMUSCULAR | Status: DC | PRN

## 2023-05-17 MED ORDER — LIDOCAINE 2% (20 MG/ML) 5 ML SYRINGE
INTRAMUSCULAR | Status: DC | PRN
  Administered 2023-05-17: 50 mg via INTRAVENOUS

## 2023-05-17 MED ORDER — OXYCODONE HCL 5 MG PO TABS: 5.0000 mg | ORAL_TABLET | Freq: Once | ORAL | Status: DC | PRN

## 2023-05-17 MED ORDER — KETAMINE HCL 50 MG/5ML IJ SOSY
PREFILLED_SYRINGE | INTRAMUSCULAR | Status: DC | PRN
  Administered 2023-05-17: 20 mg via INTRAVENOUS
  Administered 2023-05-17: 30 mg via INTRAVENOUS

## 2023-05-17 MED ORDER — LACTATED RINGERS IV SOLN: INTRAVENOUS | Status: DC

## 2023-05-17 MED ORDER — CEFAZOLIN SODIUM-DEXTROSE 2-4 GM/100ML-% IV SOLN
INTRAVENOUS | Status: AC
  Filled 2023-05-17: qty 100

## 2023-05-17 MED ORDER — MIDAZOLAM HCL 2 MG/2ML IJ SOLN
INTRAMUSCULAR | Status: AC
  Filled 2023-05-17: qty 2

## 2023-05-17 SURGICAL SUPPLY — 45 items
BLADE MED AGGRESSIVE (BLADE) IMPLANT
BLADE OSC/SAGITTAL MD 5.5X18 (BLADE) IMPLANT
BLADE SURG 15 STRL LF DISP TIS (BLADE) ×2 IMPLANT
BLADE SURG MINI STRL (BLADE) ×1 IMPLANT
BNDG ELASTIC 4INX 5YD STR LF (GAUZE/BANDAGES/DRESSINGS) IMPLANT
BNDG ESMARCH 4X12 STRL LF (GAUZE/BANDAGES/DRESSINGS) ×1 IMPLANT
BNDG GAUZE DERMACEA FLUFF 4 (GAUZE/BANDAGES/DRESSINGS) IMPLANT
BNDG STRETCH GAUZE 3IN X12FT (GAUZE/BANDAGES/DRESSINGS) IMPLANT
CUFF TOURN SGL QUICK 12 (TOURNIQUET CUFF) IMPLANT
CUFF TOURN SGL QUICK 18 (TOURNIQUET CUFF) IMPLANT
CUFF TOURN SGL QUICK 18X4 (TOURNIQUET CUFF) IMPLANT
DRAPE FLUOR MINI C-ARM 54X84 (DRAPES) IMPLANT
DURAPREP 26ML APPLICATOR (WOUND CARE) IMPLANT
ELECT REM PT RETURN 9FT ADLT (ELECTROSURGICAL) ×1 IMPLANT
ELECTRODE REM PT RTRN 9FT ADLT (ELECTROSURGICAL) ×1 IMPLANT
GAUZE SPONGE 4X4 12PLY STRL (GAUZE/BANDAGES/DRESSINGS) ×1 IMPLANT
GAUZE STRETCH 2X75IN STRL (MISCELLANEOUS) IMPLANT
GAUZE XEROFORM 1X8 LF (GAUZE/BANDAGES/DRESSINGS) ×1 IMPLANT
GLOVE BIO SURGEON STRL SZ7.5 (GLOVE) ×1 IMPLANT
GLOVE INDICATOR 8.0 STRL GRN (GLOVE) ×1 IMPLANT
GOWN STRL REUS W/ TWL LRG LVL3 (GOWN DISPOSABLE) ×2 IMPLANT
HANDPIECE VERSAJET DEBRIDEMENT (MISCELLANEOUS) IMPLANT
IV NS IRRIG 3000ML ARTHROMATIC (IV SOLUTION) IMPLANT
KIT TURNOVER KIT A (KITS) ×1 IMPLANT
MANIFOLD NEPTUNE II (INSTRUMENTS) ×1 IMPLANT
NDL FILTER BLUNT 18X1 1/2 (NEEDLE) ×1 IMPLANT
NDL HYPO 25X1 1.5 SAFETY (NEEDLE) ×2 IMPLANT
NEEDLE FILTER BLUNT 18X1 1/2 (NEEDLE) ×1 IMPLANT
NEEDLE HYPO 25X1 1.5 SAFETY (NEEDLE) ×2 IMPLANT
NS IRRIG 500ML POUR BTL (IV SOLUTION) ×1 IMPLANT
PACK EXTREMITY ARMC (MISCELLANEOUS) ×1 IMPLANT
PAD ABD DERMACEA PRESS 5X9 (GAUZE/BANDAGES/DRESSINGS) IMPLANT
SOL PREP PVP 2OZ (MISCELLANEOUS) ×2 IMPLANT
SOLUTION PREP PVP 2OZ (MISCELLANEOUS) ×2 IMPLANT
STOCKINETTE BIAS CUT 4 980044 (GAUZE/BANDAGES/DRESSINGS) IMPLANT
STOCKINETTE STRL 6IN 960660 (GAUZE/BANDAGES/DRESSINGS) ×1 IMPLANT
STRIP CLOSURE SKIN 1/4X4 (GAUZE/BANDAGES/DRESSINGS) IMPLANT
SUT ETHILON 3-0 FS-10 30 BLK (SUTURE) ×1 IMPLANT
SUT ETHILON 4-0 FS2 18XMFL BLK (SUTURE) IMPLANT
SUTURE EHLN 3-0 FS-10 30 BLK (SUTURE) ×1 IMPLANT
SUTURE ETHLN 4-0 FS2 18XMF BLK (SUTURE) IMPLANT
SWAB CULTURE AMIES ANAERIB BLU (MISCELLANEOUS) IMPLANT
SYR 10ML LL (SYRINGE) ×2 IMPLANT
TRAP FLUID SMOKE EVACUATOR (MISCELLANEOUS) ×1 IMPLANT
WATER STERILE IRR 500ML POUR (IV SOLUTION) ×1 IMPLANT

## 2023-05-17 NOTE — Op Note (Signed)
Date of operation: 05/17/2023.  Surgeon: Ricci Barker D.P.M.  Preoperative diagnosis: Osteomyelitis left second toe.  Postoperative diagnosis: Same.  Procedure: Amputation left second toe metatarsal phalangeal joint.  Anesthesia: Local MAC.  Hemostasis: Pneumatic tourniquet left ankle 250 mmHg.  Estimated blood loss: Less than 5 cc.  Cultures: 1.  Swab left second toe. 2.  Bone distal phalanx.  Pathology: Left second toe.  Complications: None apparent.  Operative indications: This is a 68 year old male with recent development of ulceration extending down to infection of the bone on his left second toe.  Decision made for amputation of the second toe.  Operative procedure: Patient was taken to the operating room and placed on the table in the supine position.  Following satisfactory sedation the left foot was anesthetized with 10 cc of 0.5% Marcaine plain around the second ray.  Pneumatic tourniquet applied to the level of the left ankle and the foot was prepped and draped in usual sterile fashion.  Foot was exsanguinated and the tourniquet inflated to 250 mmHg. Attention was then directed to the second toe area where an incision was made from dorsal to plantar ellipsing around the medial and lateral base of the second toe.  Incision carried sharply down to the level of the bone and dissection carried back to the metatarsal phalangeal joint where the toe was disarticulated and removed in toto.  The wound was flushed with copious amounts of sterile saline.  The incision was then closed using 3-0 nylon vertical mattress and simple interrupted sutures.  Xeroform 4 x 4's and conformer applied to the left foot.  The tourniquet was released.  ABD Curlex and Ace wrap then applied.  Patient was awakened and transported to the PACU having tolerated the anesthesia and procedure well.

## 2023-05-17 NOTE — Interval H&P Note (Signed)
History and Physical Interval Note:  05/17/2023 10:36 AM  Tyler Cohen.  has presented today for surgery, with the diagnosis of M86.172 - Acute osteomyelitis of left ankle or foot.  The various methods of treatment have been discussed with the patient and family. After consideration of risks, benefits and other options for treatment, the patient has consented to  Procedure(s): AMPUTATION TOE METATARSOPHALANGEAL JOINT (Left) as a surgical intervention.  The patient's history has been reviewed, patient examined, no change in status, stable for surgery.  I have reviewed the patient's chart and labs.  Questions were answered to the patient's satisfaction.     Ricci Barker

## 2023-05-17 NOTE — Anesthesia Preprocedure Evaluation (Addendum)
Anesthesia Evaluation  Patient identified by MRN, date of birth, ID band Patient awake  General Assessment Comment:"Patient admitted to the hospital on 05/14/2021 for atrial fibrillation and shortness of breath.  Thyroid dysfunction noted with hormone levels consistent with thyrotoxicosis.  Patient was started on PTU, propranolol, hydrocortisone, Colesytramine, and IV Lasix at that time.  He was also treated for a RIGHT lower lobe pneumonia.  Hospitalization was complicated by the development of cardiogenic shock requiring milrinone for inotropic support.  Patient was transition to amiodarone, dobutamine, and digoxin.  Interventions converted patient to normal sinus rhythm, however despite treatment, EF did not recover.  He was discharged home on oral amiodarone and digoxin. "  Reviewed: Allergy & Precautions, NPO status , Patient's Chart, lab work & pertinent test results  History of Anesthesia Complications Negative for: history of anesthetic complications  Airway Mallampati: II  TM Distance: >3 FB Neck ROM: full    Dental  (+) Poor Dentition, Missing, Loose   Pulmonary former smoker RLL pulm nodule   Pulmonary exam normal        Cardiovascular Exercise Tolerance: Good METS: 3 - Mets hypertension, On Medications + Peripheral Vascular Disease and +CHF  Normal cardiovascular exam+ dysrhythmias Atrial Fibrillation + Valvular Problems/Murmurs   Echo 06/2021  INTERPRETATION  SEVERE LV SYSTOLIC DYSFUNCTION (See above)  MILD RV SYSTOLIC DYSFUNCTION (See above)  NO VALVULAR STENOSIS  DILATED LV (LVIDd 6.4 cm), EF ~ 25-30%  MODERATE TO SEVERE MR  POSSIBLE PFO NOTED     Neuro/Psych negative neurological ROS  negative psych ROS   GI/Hepatic negative GI ROS,,,(+)     substance abuse  marijuana use  Endo/Other   Hyperthyroidism   Renal/GU      Musculoskeletal  (+) Arthritis , Osteoarthritis,    Abdominal   Peds   Hematology negative hematology ROS (+)   Anesthesia Other Findings Past Medical History: 2019: Adrenal mass (HCC) No date: Aortic atherosclerosis (HCC) No date: Arthritis No date: Atrial fibrillation (HCC)     Comment:  a.) CHA2DS2VASc = 4 (age, CHF, HTN, vascular disease               history);  b.) s/p DCCV (300 J x 1) 05/18/2021; c.)               rate/rhythm maintained on oral digoxin; chronically               anticoagulated with apixaban No date: Benign neoplasm of sigmoid colon No date: BPH (benign prostatic hyperplasia) No date: Dental disease 2018: Digital mucinous cyst of finger of right hand No date: Elevated PSA No date: Heart murmur No date: Hepatic cyst No date: Hepatic hemangioma No date: HFrEF (heart failure with reduced ejection fraction) (HCC)     Comment:  a.) TTE 05/14/2021: EF 25-30%, glob HK, mod dil LV, RVE,              PASP 39.8; b.) TTE 06/25/2021: EF 30%, LAE, triv AR/PR,               mild TR, mod MR, G1DD No date: HLD (hyperlipidemia) No date: HTN (hypertension) 2019: Hyperthyroidism 2019: Left thyroid nodule 2018: Liver lesion No date: Long term current use of antiarrhythmic drug     Comment:  a.) digoxin No date: Macrocytosis 05/15/2021: Malnutrition of moderate degree (HCC) No date: Marijuana use No date: Multinodular goiter No date: NICM (nonischemic cardiomyopathy) (HCC)     Comment:  a.) TTE 05/14/2021: EF 25-30%; b.) TTE 06/25/2021: EF  30% No date: Non compliance w medication regimen No date: On apixaban therapy 05/03/2023: Osteomyelitis of great toe (HCC) No date: Peripheral vascular disease (HCC) 2017: Psoriasis 2018: Renal mass 08/20/2021: Right lower lobe pulmonary nodule     Comment:  a.) cCTA 08/20/2021: measured 7 x 5 mm. 05/13/2021: Thyroid crisis or storm No date: Tobacco abuse  Past Surgical History: 12/01/2015: COLONOSCOPY WITH PROPOFOL; N/A     Comment:  Procedure: COLONOSCOPY WITH PROPOFOL;  Surgeon:  Midge Minium, MD;  Location: [facility];  Service:               Endoscopy;  Laterality: N/A; 12/01/2015: POLYPECTOMY     Comment:  Procedure: POLYPECTOMY;  Surgeon: Midge Minium, MD;                Location: [facility];  Service: Endoscopy;; 05/25/2022: PROSTATE BIOPSY; N/A     Comment:  Procedure: PROSTATE BIOPSY;  Surgeon: Riki Altes,               MD;  Location: [facility];  Service: Urology;  Laterality:               N/A; 05/18/2021: TEE WITH CARDIOVERSION; N/A 05/25/2022: TRANSRECTAL ULTRASOUND; N/A     Comment:  Procedure: TRANSRECTAL ULTRASOUND;  Surgeon: Riki Altes, MD;  Location: [facility];  Service: Urology;                Laterality: N/A;  BMI    Body Mass Index: 22.80 kg/m      Reproductive/Obstetrics negative OB ROS                             Anesthesia Physical Anesthesia Plan  ASA: 3  Anesthesia Plan: General LMA   Post-op Pain Management: Toradol IV (intra-op)*, Ofirmev IV (intra-op)* and Regional block*   Induction: Intravenous  PONV Risk Score and Plan: 2 and Dexamethasone, Ondansetron, Midazolam and Treatment may vary due to age or medical condition  Airway Management Planned: LMA  Additional Equipment:   Intra-op Plan:   Post-operative Plan: Extubation in OR  Informed Consent: I have reviewed the patients History and Physical, chart, labs and discussed the procedure including the risks, benefits and alternatives for the proposed anesthesia with the patient or authorized representative who has indicated his/her understanding and acceptance.     Dental Advisory Given  Plan Discussed with: Anesthesiologist, CRNA and Surgeon  Anesthesia Plan Comments: (Patient consented for risks of anesthesia including but not limited to:  - adverse reactions to medications - damage to eyes, teeth, lips or other oral mucosa - nerve damage due to positioning  - sore throat or  hoarseness - Damage to heart, brain, nerves, lungs, other parts of body or loss of life  Patient voiced understanding and assent.)        Anesthesia Quick Evaluation

## 2023-05-17 NOTE — Transfer of Care (Signed)
Immediate Anesthesia Transfer of Care Note  Patient: Tyler Cohen.  Procedure(s) Performed: AMPUTATION TOE METATARSOPHALANGEAL JOINT (Left: Toe)  Patient Location: PACU  Anesthesia Type:General  Level of Consciousness: sedated  Airway & Oxygen Therapy: Patient Spontanous Breathing and Patient connected to face mask oxygen  Post-op Assessment: Report given to RN and Post -op Vital signs reviewed and stable  Post vital signs: Reviewed  Last Vitals:  Vitals Value Taken Time  BP 95/71   Temp    Pulse 80   Resp 16   SpO2 100     Last Pain:         Complications: No notable events documented.

## 2023-05-17 NOTE — Discharge Instructions (Addendum)
1.  Elevate the left foot on 2 pillows.  2.  Keep the bandage on the left foot clean, dry, and do not remove.  3.  Sponge bathe only left lower extremity.  4.  Wear surgical shoe on the left foot whenever walking or standing.  5.  Take 1 pain pill, Norco, every 4 hours only if needed for pain.

## 2023-05-17 NOTE — H&P (Signed)
Subjective: Patient presents today for amputation of his left second toe.  Objective: Neurovascular status grossly intact.  Unchanged from prior outpatient visit.  Ulceration noted at the distal medial aspect of the left second toe probing down to the level of bone.  Outpatient x-rays reveal some cortical destruction in the distal phalanx.  Assessment: Osteomyelitis left second toe.  Plan: Medical history and physical in the chart reviewed.  Patient stable to proceed with surgery for amputation of the left second toe.

## 2023-05-17 NOTE — Anesthesia Postprocedure Evaluation (Signed)
Anesthesia Post Note  Patient: Tyler Cohen.  Procedure(s) Performed: AMPUTATION TOE METATARSOPHALANGEAL JOINT (Left: Toe)  Patient location during evaluation: PACU Anesthesia Type: General Level of consciousness: awake and alert Pain management: pain level controlled Vital Signs Assessment: post-procedure vital signs reviewed and stable Respiratory status: spontaneous breathing, nonlabored ventilation, respiratory function stable and patient connected to nasal cannula oxygen Cardiovascular status: blood pressure returned to baseline and stable Postop Assessment: no apparent nausea or vomiting Anesthetic complications: no   No notable events documented.   Last Vitals:  Vitals:   05/17/23 1315 05/17/23 1331  BP: 97/81 94/73  Pulse:  73  Resp:  15  Temp:  (!) 36.4 C  SpO2:  97%    Last Pain:  Vitals:   05/17/23 1331  TempSrc:   PainSc: 3                  Louie Boston

## 2023-05-18 ENCOUNTER — Encounter: Payer: Self-pay | Admitting: Podiatry

## 2023-05-19 LAB — SURGICAL PATHOLOGY

## 2023-05-22 ENCOUNTER — Other Ambulatory Visit: Payer: Self-pay | Admitting: Podiatry

## 2023-05-22 LAB — AEROBIC/ANAEROBIC CULTURE W GRAM STAIN (SURGICAL/DEEP WOUND): Gram Stain: NONE SEEN

## 2023-05-23 DIAGNOSIS — Z89422 Acquired absence of other left toe(s): Secondary | ICD-10-CM | POA: Diagnosis not present

## 2023-05-23 DIAGNOSIS — M86172 Other acute osteomyelitis, left ankle and foot: Secondary | ICD-10-CM | POA: Diagnosis not present

## 2023-05-24 NOTE — Progress Notes (Signed)
Results printed and mailed.   

## 2023-05-31 DIAGNOSIS — Z89422 Acquired absence of other left toe(s): Secondary | ICD-10-CM | POA: Diagnosis not present

## 2023-05-31 DIAGNOSIS — M86172 Other acute osteomyelitis, left ankle and foot: Secondary | ICD-10-CM | POA: Diagnosis not present

## 2023-06-14 DIAGNOSIS — E042 Nontoxic multinodular goiter: Secondary | ICD-10-CM | POA: Diagnosis not present

## 2023-06-14 DIAGNOSIS — E0590 Thyrotoxicosis, unspecified without thyrotoxic crisis or storm: Secondary | ICD-10-CM | POA: Diagnosis not present

## 2023-06-20 ENCOUNTER — Encounter: Payer: Self-pay | Admitting: Family Medicine

## 2023-06-20 ENCOUNTER — Ambulatory Visit: Payer: Medicare HMO | Admitting: Family Medicine

## 2023-06-20 VITALS — BP 107/72 | HR 70 | Ht 73.0 in | Wt 170.2 lb

## 2023-06-20 DIAGNOSIS — I700 Atherosclerosis of aorta: Secondary | ICD-10-CM

## 2023-06-20 DIAGNOSIS — Z89412 Acquired absence of left great toe: Secondary | ICD-10-CM

## 2023-06-20 DIAGNOSIS — R310 Gross hematuria: Secondary | ICD-10-CM | POA: Diagnosis not present

## 2023-06-20 DIAGNOSIS — R350 Frequency of micturition: Secondary | ICD-10-CM | POA: Diagnosis not present

## 2023-06-20 DIAGNOSIS — Z59819 Housing instability, housed unspecified: Secondary | ICD-10-CM

## 2023-06-20 DIAGNOSIS — Z0000 Encounter for general adult medical examination without abnormal findings: Secondary | ICD-10-CM

## 2023-06-20 DIAGNOSIS — Z23 Encounter for immunization: Secondary | ICD-10-CM

## 2023-06-20 DIAGNOSIS — N401 Enlarged prostate with lower urinary tract symptoms: Secondary | ICD-10-CM

## 2023-06-20 DIAGNOSIS — E0590 Thyrotoxicosis, unspecified without thyrotoxic crisis or storm: Secondary | ICD-10-CM

## 2023-06-20 DIAGNOSIS — I480 Paroxysmal atrial fibrillation: Secondary | ICD-10-CM | POA: Diagnosis not present

## 2023-06-20 DIAGNOSIS — E7800 Pure hypercholesterolemia, unspecified: Secondary | ICD-10-CM

## 2023-06-20 DIAGNOSIS — I5022 Chronic systolic (congestive) heart failure: Secondary | ICD-10-CM

## 2023-06-20 DIAGNOSIS — S81801A Unspecified open wound, right lower leg, initial encounter: Secondary | ICD-10-CM

## 2023-06-20 LAB — URINALYSIS, ROUTINE W REFLEX MICROSCOPIC
Bilirubin, UA: NEGATIVE
Glucose, UA: NEGATIVE
Ketones, UA: NEGATIVE
Nitrite, UA: NEGATIVE
Protein,UA: NEGATIVE
RBC, UA: NEGATIVE
Specific Gravity, UA: 1.015 (ref 1.005–1.030)
Urobilinogen, Ur: 0.2 mg/dL (ref 0.2–1.0)
pH, UA: 7 (ref 5.0–7.5)

## 2023-06-20 LAB — MICROSCOPIC EXAMINATION: RBC, Urine: NONE SEEN /HPF (ref 0–2)

## 2023-06-20 LAB — MICROALBUMIN, URINE WAIVED
Creatinine, Urine Waived: 50 mg/dL (ref 10–300)
Microalb, Ur Waived: 10 mg/L (ref 0–19)
Microalb/Creat Ratio: <30 mg/g (ref <30)

## 2023-06-20 MED ORDER — ATORVASTATIN CALCIUM 20 MG PO TABS: ORAL_TABLET | ORAL | 2 refills | Status: DC

## 2023-06-20 MED ORDER — TAMSULOSIN HCL 0.4 MG PO CAPS: 0.4000 mg | ORAL_CAPSULE | Freq: Every day | ORAL | 1 refills | Status: DC

## 2023-06-20 MED ORDER — EMPAGLIFLOZIN 10 MG PO TABS: 10.0000 mg | ORAL_TABLET | Freq: Every day | ORAL | 1 refills | Status: DC

## 2023-06-20 MED ORDER — LOSARTAN POTASSIUM 25 MG PO TABS: 25.0000 mg | ORAL_TABLET | Freq: Every day | ORAL | 1 refills | Status: DC

## 2023-06-20 MED ORDER — SPIRONOLACTONE 25 MG PO TABS: 12.5000 mg | ORAL_TABLET | Freq: Every day | ORAL | 1 refills | Status: DC

## 2023-06-20 NOTE — Assessment & Plan Note (Signed)
 Under good control on current regimen. Continue current regimen. Continue to monitor. Call with any concerns. Refills given. Labs drawn today.

## 2023-06-20 NOTE — Assessment & Plan Note (Signed)
 Will keep BP and cholesterol under good control. Continue to monitor. Call with any concerns.

## 2023-06-20 NOTE — Patient Instructions (Signed)
  Tyler Cohen , Thank you for taking time to come for your Medicare Wellness Visit. I appreciate your ongoing commitment to your health goals. Please review the following plan we discussed and let me know if I can assist you in the future.   These are the goals we discussed:  Goals      Increase physical activity     By riding bike  more     Obtain independent housing     Timeframe:  Long-Range Goal Priority:  High Start Date:      11/13/20                       Expected End Date:  02/02/21                     Follow Up Date 03/23/21   Patient Goals/Self-Care Activities: Over the next 120 days Follow up with previous referral to the care guide for housing resources, they will follow up with you  Contact clinic with any questions or concerns Utilize resources discussed     Patient Stated     01/07/2020, no goals     Patient Stated     01/09/2021, no goals     Quit Smoking     Smoking cessation discussed      Quit smoking / using tobacco     SMoking cessation disscussed        This is a list of the screening recommended for you and due dates:  Health Maintenance  Topic Date Due   Zoster (Shingles) Vaccine (1 of 2) Never done   Medicare Annual Wellness Visit  01/13/2023   Colon Cancer Screening  11/30/2025   DTaP/Tdap/Td vaccine (3 - Td or Tdap) 06/19/2033   Pneumonia Vaccine  Completed   Flu Shot  Completed   Hepatitis C Screening  Completed   HPV Vaccine  Aged Out   COVID-19 Vaccine  Discontinued

## 2023-06-20 NOTE — Assessment & Plan Note (Signed)
 Continue to follow with cardiology. Labs drawn today. Call with any concerns.

## 2023-06-20 NOTE — Assessment & Plan Note (Signed)
 Euvolemic today. Continue to follow with cardiology. Labs drawn today. Call with any concerns.

## 2023-06-20 NOTE — Assessment & Plan Note (Signed)
 Doing well. Continue to follow with podiatry. Call with any concerns.

## 2023-06-20 NOTE — Progress Notes (Signed)
 Subjective:   Tyler Cohen. is a 68 y.o. male who presents for Medicare Annual/Subsequent preventive examination.  Visit Complete: In person  Patient Medicare AWV questionnaire was completed by the patient on 06/20/23; I have confirmed that all information answered by patient is correct and no changes since this date.        Objective:    Today's Vitals   06/20/23 0809  BP: 107/72  Pulse: 70  SpO2: 98%  Weight: 170 lb 3.2 oz (77.2 kg)  Height: 6\' 1"  (1.854 m)   Body mass index is 22.46 kg/m.     05/17/2023   10:11 AM 05/09/2023    1:54 PM 11/17/2022    9:56 AM 09/10/2022   10:18 AM 07/13/2022    8:45 AM 06/16/2022   10:15 AM 05/25/2022    7:05 AM  Advanced Directives  Does Patient Have a Medical Advance Directive? No No No No No No No  Would patient like information on creating a medical advance directive? No - Patient declined No - Patient declined No - Patient declined  No - Patient declined No - Patient declined No - Patient declined    Current Medications (verified) Outpatient Encounter Medications as of 06/20/2023  Medication Sig   albuterol (VENTOLIN HFA) 108 (90 Base) MCG/ACT inhaler Inhale 2 puffs into the lungs every 6 (six) hours as needed for wheezing or shortness of breath.   Amoxicillin-Pot Clavulanate (AUGMENTIN PO) Take 1 tablet by mouth daily at 6 (six) AM.   apixaban (ELIQUIS) 5 MG TABS tablet TAKE 1 TABLET BY MOUTH TWICE DAILY   atorvastatin (LIPITOR) 20 MG tablet TAKE 1 TABLET BY MOUTH AT BEDTIME (Patient taking differently: Take 20 mg by mouth every morning. TAKE 1 TABLET BY MOUTH AT BEDTIME)   digoxin (LANOXIN) 0.125 MG tablet TAKE 1 TABLET BY MOUTH ONCE DAILY (Patient taking differently: Take 125 mcg by mouth every morning.)   furosemide (LASIX) 40 MG tablet TAKE 1 TABLET BY MOUTH ONCE DAILY (Patient taking differently: Take 40 mg by mouth every morning.)   HYDROcodone-acetaminophen (NORCO/VICODIN) 5-325 MG tablet Take 1 tablet by mouth every 4 (four)  hours as needed for moderate pain (pain score 4-6).   JARDIANCE 10 MG TABS tablet TAKE 1 TABLET BY MOUTH ONCE DAILY   losartan (COZAAR) 25 MG tablet Take 1 tablet (25 mg total) by mouth daily. (Patient taking differently: Take 25 mg by mouth every morning.)   methimazole (TAPAZOLE) 10 MG tablet Take 1 tablet (10 mg total) by mouth daily. (Patient taking differently: Take 10 mg by mouth See admin instructions. Take with 5 mg for a total of 15 mg daily)   methimazole (TAPAZOLE) 5 MG tablet Take 5 mg by mouth See admin instructions. Take with 10 mg for a total of 15 mg daily   spironolactone (ALDACTONE) 25 MG tablet Take 0.5 tablets (12.5 mg total) by mouth daily. (Patient taking differently: Take 12.5 mg by mouth every morning.)   tamsulosin (FLOMAX) 0.4 MG CAPS capsule TAKE 1 CAPSULE BY MOUTH ONCE DAILY 30 MINUTES AFTER LARGEST MEAL. (Patient taking differently: 0.4 mg daily after breakfast.)   No facility-administered encounter medications on file as of 06/20/2023.    Allergies (verified) Lactose intolerance (gi)   History: Past Medical History:  Diagnosis Date   Adrenal mass (HCC) 2019   Aortic atherosclerosis (HCC)    Arthritis    Atrial fibrillation (HCC)    a.) CHA2DS2VASc = 4 (age, CHF, HTN, vascular disease history);  b.) s/p  DCCV (300 J x 1) 05/18/2021; c.) rate/rhythm maintained on oral digoxin; chronically anticoagulated with apixaban   Benign neoplasm of sigmoid colon    BPH (benign prostatic hyperplasia)    Dental disease    Digital mucinous cyst of finger of right hand 2018   Elevated PSA    Heart murmur    Hepatic cyst    Hepatic hemangioma    HFrEF (heart failure with reduced ejection fraction) (HCC)    a.) TTE 05/14/2021: EF 25-30%, glob HK, mod dil LV, RVE, PASP 39.8; b.) TTE 06/25/2021: EF 30%, LAE, triv AR/PR, mild TR, mod MR, G1DD   HLD (hyperlipidemia)    HTN (hypertension)    Hyperthyroidism 2019   Left thyroid nodule 2019   Liver lesion 2018   Long term  current use of antiarrhythmic drug    a.) digoxin   Macrocytosis    Malnutrition of moderate degree (HCC) 05/15/2021   Marijuana use    Multinodular goiter    NICM (nonischemic cardiomyopathy) (HCC)    a.) TTE 05/14/2021: EF 25-30%; b.) TTE 06/25/2021: EF 30%   Non compliance w medication regimen    On apixaban therapy    Osteomyelitis of great toe (HCC) 05/03/2023   Peripheral vascular disease (HCC)    Psoriasis 2017   Renal mass 2018   Right lower lobe pulmonary nodule 08/20/2021   a.) cCTA 08/20/2021: measured 7 x 5 mm.   Thyroid crisis or storm 05/13/2021   Tobacco abuse    Past Surgical History:  Procedure Laterality Date   AMPUTATION TOE Left 05/17/2023   Procedure: AMPUTATION TOE METATARSOPHALANGEAL JOINT;  Surgeon: Linus Galas, DPM;  Location: [facility];  Service: Orthopedics/Podiatry;  Laterality: Left;   COLONOSCOPY WITH PROPOFOL N/A 12/01/2015   Procedure: COLONOSCOPY WITH PROPOFOL;  Surgeon: Midge Minium, MD;  Location: [facility];  Service: Endoscopy;  Laterality: N/A;   POLYPECTOMY  12/01/2015   Procedure: POLYPECTOMY;  Surgeon: Midge Minium, MD;  Location: [facility];  Service: Endoscopy;;   PROSTATE BIOPSY N/A 05/25/2022   Procedure: PROSTATE BIOPSY;  Surgeon: Riki Altes, MD;  Location: [facility];  Service: Urology;  Laterality: N/A;   TEE WITH CARDIOVERSION N/A 05/18/2021   TRANSRECTAL ULTRASOUND N/A 05/25/2022   Procedure: TRANSRECTAL ULTRASOUND;  Surgeon: Riki Altes, MD;  Location: [facility];  Service: Urology;  Laterality: N/A;   Family History  Problem Relation Age of Onset   Hypertension Mother    Diabetes Mother        lost both legs   Kidney disease Mother    Hypertension Father    Emphysema Father    Sickle cell trait Father    Hypertension Brother    Hyperlipidemia Brother    Sickle cell trait Sister    Social History   Socioeconomic History   Marital status: Married    Spouse name: Not on file   Number of children:  Not on file   Years of education: Not on file   Highest education level: High school graduate  Occupational History   Not on file  Tobacco Use   Smoking status: Former    Current packs/day: 0.00    Average packs/day: 0.3 packs/day for 42.0 years (10.5 ttl pk-yrs)    Types: Cigars, Cigarettes    Start date: 05/18/1979    Quit date: 05/17/2021    Years since quitting: 2.0   Smokeless tobacco: Never  Vaping Use   Vaping status: Never Used  Substance and Sexual Activity  Alcohol use: No   Drug use: Yes    Types: Marijuana    Comment: pt states he smokes every once in a while   Sexual activity: Yes    Birth control/protection: None  Other Topics Concern   Not on file  Social History Narrative   Works part time.   Social Drivers of Corporate investment banker Strain: Low Risk  (05/23/2023)   Received from [facility]   Overall Financial Resource Strain (CARDIA)    Difficulty of Paying Living Expenses: Not hard at all  Food Insecurity: No Food Insecurity (06/20/2023)   Hunger Vital Sign    Worried About Running Out of Food in the Last Year: Never true    Ran Out of Food in the Last Year: Never true  Recent Concern: Food Insecurity - Food Insecurity Present (05/23/2023)   Received from [facility]   Hunger Vital Sign    Worried About Running Out of Food in the Last Year: Often true    Ran Out of Food in the Last Year: Sometimes true  Transportation Needs: No Transportation Needs (06/20/2023)   PRAPARE - Administrator, Civil Service (Medical): No    Lack of Transportation (Non-Medical): No  Physical Activity: Insufficiently Active (01/12/2022)   Exercise Vital Sign    Days of Exercise per Week: 3 days    Minutes of Exercise per Session: 30 min  Stress: Stress Concern Present (01/12/2022)   [employer] of Occupational Health - Occupational Stress Questionnaire    Feeling of Stress : To some extent  Social Connections:  Moderately Integrated (01/12/2022)   Social Connection and Isolation Panel [NHANES]    Frequency of Communication with Friends and Family: Twice a week    Frequency of Social Gatherings with Friends and Family: Never    Attends Religious Services: More than 4 times per year    Active Member of [employer] or Organizations: Yes    Attends Engineer, structural: More than 4 times per year    Marital Status: Married    Tobacco Counseling Counseling given: Not Answered   Clinical Intake:                        Activities of Daily Living    06/20/2023    8:22 AM 05/16/2023   10:46 AM  In your present state of health, do you have any difficulty performing the following activities:  Hearing? 0   Vision? 0   Difficulty concentrating or making decisions? 0   Walking or climbing stairs? 1   Dressing or bathing? 0   Doing errands, shopping? 0 0    Patient Care Team: Dorcas Carrow, DO as PCP - General (Family Medicine)  Indicate any recent Medical Services you may have received from other than [facility] providers in the past year (date may be approximate).     Assessment:   This is a routine wellness examination for Cristina.  Hearing/Vision screen No results found.   Goals Addressed   None   Depression Screen    06/20/2023    8:14 AM 04/28/2023   10:59 AM 12/20/2022    8:23 AM 04/22/2022    9:08 AM 03/25/2022    9:04 AM 01/12/2022   10:42 AM 06/29/2021    9:50 AM  PHQ 2/9 Scores  PHQ - 2 Score 0 0 0 0 0 2 0  PHQ- 9 Score 2 0 0 1 1  2 0    Fall Risk    06/20/2023    8:28 AM 04/28/2023   10:59 AM 12/20/2022    8:23 AM 04/22/2022    9:08 AM 03/25/2022    9:03 AM  Fall Risk   Falls in the past year? 0 0 0 0 0  Number falls in past yr: 0 0 0 0 0  Injury with Fall? 0 0 0 0 0  Risk for fall due to :  No Fall Risks No Fall Risks No Fall Risks No Fall Risks  Follow up  Falls prevention discussed;Education provided;Falls evaluation completed Falls evaluation  completed Falls evaluation completed Falls evaluation completed    MEDICARE RISK AT HOME:    TIMED UP AND GO:  Was the test performed?  Yes  Length of time to ambulate 10 feet: 5 sec Gait slow and steady without use of assistive device    Cognitive Function:        06/20/2023    8:24 AM 01/12/2022   10:34 AM 01/09/2021    9:56 AM 01/07/2020    9:07 AM 12/26/2018    9:18 AM  6CIT Screen  What Year? 0 points 0 points 0 points 0 points 0 points  What month? 0 points 0 points 0 points 0 points 0 points  What time? 0 points 0 points 0 points 0 points 0 points  Count back from 20 0 points 0 points 0 points 0 points 2 points  Months in reverse 4 points 2 points 0 points 0 points 0 points  Repeat phrase 6 points 2 points 8 points 0 points 2 points  Total Score 10 points 4 points 8 points 0 points 4 points    Immunizations Immunization History  Administered Date(s) Administered   Fluad Quad(high Dose 65+) 03/10/2021, 03/25/2022   Influenza, High Dose Seasonal PF 12/14/2022   Influenza,inj,Quad PF,6+ Mos 02/17/2016, 05/04/2017, 03/10/2018, 12/26/2018, 01/14/2020   Influenza-Unspecified 01/21/2021, 03/10/2021   PFIZER(Purple Top)SARS-COV-2 Vaccination 07/01/2019, 07/24/2019, 05/15/2020, 10/27/2020   Pneumococcal Conjugate,unspecified 03/20/2021   Pneumococcal Conjugate-13 03/20/2021   Pneumococcal Polysaccharide-23 01/23/2014, 03/25/2022   Tdap 10/24/2013    TDAP status: Completed at today's visit  Flu Vaccine status: Up to date  Pneumococcal vaccine status: Up to date  Covid-19 vaccine status: Declined, Education has been provided regarding the importance of this vaccine but patient still declined. Advised may receive this vaccine at local pharmacy or Health Dept.or vaccine clinic. Aware to provide a copy of the vaccination record if obtained from local pharmacy or Health Dept. Verbalized acceptance and understanding.  Qualifies for Shingles Vaccine? Yes   Zostavax completed  No    Screening Tests Health Maintenance  Topic Date Due   Zoster Vaccines- Shingrix (1 of 2) Never done   Medicare Annual Wellness (AWV)  01/13/2023   DTaP/Tdap/Td (2 - Td or Tdap) 10/25/2023   Colonoscopy  11/30/2025   Pneumonia Vaccine 36+ Years old  Completed   INFLUENZA VACCINE  Completed   Hepatitis C Screening  Completed   HPV VACCINES  Aged Out   COVID-19 Vaccine  Discontinued    Health Maintenance  Health Maintenance Due  Topic Date Due   Zoster Vaccines- Shingrix (1 of 2) Never done   Medicare Annual Wellness (AWV)  01/13/2023    Colorectal cancer screening: Type of screening: Colonoscopy. Completed 11/30/15. Repeat every 10 years  Lung Cancer Screening: (Low Dose CT Chest recommended if Age 50-80 years, 20 pack-year currently smoking OR have quit w/in 15years.) does not qualify.  Additional Screening:  Hepatitis C Screening: does qualify; Completed 09/03/15  Vision Screening: Recommended annual ophthalmology exams for early detection of glaucoma and other disorders of the eye. Is the patient up to date with their annual eye exam?  Yes   Dental Screening: Recommended annual dental exams for proper oral hygiene  Community Resource Referral / Chronic Care Management: CRR required this visit?  No   CCM required this visit?  Ordered today     Plan:     I have personally reviewed and noted the following in the patient's chart:   Medical and social history Use of alcohol, tobacco or illicit drugs  Current medications and supplements including opioid prescriptions. Patient is not currently taking opioid prescriptions. Functional ability and status Nutritional status Physical activity Advanced directives List of other physicians Hospitalizations, surgeries, and ER visits in previous 12 months Vitals Screenings to include cognitive, depression, and falls Referrals and appointments  In addition, I have reviewed and discussed with patient certain preventive  protocols, quality metrics, and best practice recommendations. A written personalized care plan for preventive services as well as general preventive health recommendations were provided to patient.     Olevia Perches, DO   06/20/2023   After Visit Summary: (In Person-Printed) AVS printed and given to the patient

## 2023-06-20 NOTE — Progress Notes (Signed)
 BP 107/72 (BP Location: Left Arm, Patient Position: Sitting, Cuff Size: Large)   Pulse 70   Ht 6\' 1"  (1.854 m)   Wt 170 lb 3.2 oz (77.2 kg)   SpO2 98%   BMI 22.46 kg/m    Subjective:    Patient ID: Tyler Presume., male    DOB: 06-08-1955, 68 y.o.   MRN: [id number]  HPI: Tyler Smethurst. is a 68 y.o. male presenting on 06/20/2023 for comprehensive medical examination. Current medical complaints include:  HYPERTENSION / HYPERLIPIDEMIA Satisfied with current treatment? yes Duration of hypertension: chronic BP monitoring frequency: not checking BP medication side effects: no Past BP meds: losartan Duration of hyperlipidemia: chronic Cholesterol medication side effects: no Cholesterol supplements: none Past cholesterol medications: atorvastatin Medication compliance: excellent compliance Aspirin: no Recent stressors: no Recurrent headaches: no Visual changes: no Palpitations: no Dyspnea: no Chest pain: no Lower extremity edema: no Dizzy/lightheaded: no  HYPERTHYROIDISM Thyroid control status:better Satisfied with current treatment? yes Medication side effects: no Medication compliance: good compliance Recent dose adjustment:no Fatigue: no Cold intolerance: no Heat intolerance: no Weight gain: no Weight loss: no Constipation: no Diarrhea/loose stools: no Palpitations: no Lower extremity edema: no Anxiety/depressed mood: no  Interim Problems from his last visit: no  Depression Screen done today and results listed below:     06/20/2023    8:14 AM 04/28/2023   10:59 AM 12/20/2022    8:23 AM 04/22/2022    9:08 AM 03/25/2022    9:04 AM  Depression screen PHQ 2/9  Decreased Interest 0 0 0 0 0  Down, Depressed, Hopeless 0 0 0 0 0  PHQ - 2 Score 0 0 0 0 0  Altered sleeping 0 0 0 0 0  Tired, decreased energy 0 0 0 0 0  Change in appetite 2 0 0 1 1  Feeling bad or failure about yourself  0 0 0 0 0  Trouble concentrating 0 0 0 0 0  Moving slowly or  fidgety/restless 0 0 0 0 0  Suicidal thoughts 0 0 0 0 0  PHQ-9 Score 2 0 0 1 1  Difficult doing work/chores  Not difficult at all Not difficult at all Not difficult at all Not difficult at all    Past Medical History:  Past Medical History:  Diagnosis Date   Adrenal mass (HCC) 2019   Aortic atherosclerosis (HCC)    Arthritis    Atrial fibrillation (HCC)    a.) CHA2DS2VASc = 4 (age, CHF, HTN, vascular disease history);  b.) s/p DCCV (300 J x 1) 05/18/2021; c.) rate/rhythm maintained on oral digoxin; chronically anticoagulated with apixaban   Benign neoplasm of sigmoid colon    BPH (benign prostatic hyperplasia)    Dental disease    Digital mucinous cyst of finger of right hand 2018   Elevated PSA    Heart murmur    Hepatic cyst    Hepatic hemangioma    HFrEF (heart failure with reduced ejection fraction) (HCC)    a.) TTE 05/14/2021: EF 25-30%, glob HK, mod dil LV, RVE, PASP 39.8; b.) TTE 06/25/2021: EF 30%, LAE, triv AR/PR, mild TR, mod MR, G1DD   HLD (hyperlipidemia)    HTN (hypertension)    Hyperthyroidism 2019   Left thyroid nodule 2019   Liver lesion 2018   Long term current use of antiarrhythmic drug    a.) digoxin   Macrocytosis    Malnutrition of moderate degree (HCC) 05/15/2021   Marijuana use  Multinodular goiter    NICM (nonischemic cardiomyopathy) (HCC)    a.) TTE 05/14/2021: EF 25-30%; b.) TTE 06/25/2021: EF 30%   Non compliance w medication regimen    On apixaban therapy    Osteomyelitis of great toe (HCC) 05/03/2023   Peripheral vascular disease (HCC)    Psoriasis 2017   Renal mass 2018   Right lower lobe pulmonary nodule 08/20/2021   a.) cCTA 08/20/2021: measured 7 x 5 mm.   Thyroid crisis or storm 05/13/2021   Tobacco abuse     Surgical History:  Past Surgical History:  Procedure Laterality Date   AMPUTATION TOE Left 05/17/2023   Procedure: AMPUTATION TOE METATARSOPHALANGEAL JOINT;  Surgeon: Linus Galas, DPM;  Location: [facility];  Service:  Orthopedics/Podiatry;  Laterality: Left;   COLONOSCOPY WITH PROPOFOL N/A 12/01/2015   Procedure: COLONOSCOPY WITH PROPOFOL;  Surgeon: Midge Minium, MD;  Location: [facility];  Service: Endoscopy;  Laterality: N/A;   POLYPECTOMY  12/01/2015   Procedure: POLYPECTOMY;  Surgeon: Midge Minium, MD;  Location: [facility];  Service: Endoscopy;;   PROSTATE BIOPSY N/A 05/25/2022   Procedure: PROSTATE BIOPSY;  Surgeon: Riki Altes, MD;  Location: [facility];  Service: Urology;  Laterality: N/A;   TEE WITH CARDIOVERSION N/A 05/18/2021   TRANSRECTAL ULTRASOUND N/A 05/25/2022   Procedure: TRANSRECTAL ULTRASOUND;  Surgeon: Riki Altes, MD;  Location: [facility];  Service: Urology;  Laterality: N/A;    Medications:  Current Outpatient Medications on File Prior to Visit  Medication Sig   albuterol (VENTOLIN HFA) 108 (90 Base) MCG/ACT inhaler Inhale 2 puffs into the lungs every 6 (six) hours as needed for wheezing or shortness of breath.   Amoxicillin-Pot Clavulanate (AUGMENTIN PO) Take 1 tablet by mouth daily at 6 (six) AM.   apixaban (ELIQUIS) 5 MG TABS tablet TAKE 1 TABLET BY MOUTH TWICE DAILY   methimazole (TAPAZOLE) 5 MG tablet Take 5 mg by mouth See admin instructions. Take with 10 mg for a total of 15 mg daily   digoxin (LANOXIN) 0.125 MG tablet Take 1 tablet (125 mcg total) by mouth daily.   furosemide (LASIX) 40 MG tablet Take 1 tablet (40 mg total) by mouth daily.   methimazole (TAPAZOLE) 10 MG tablet Take 1 tablet (10 mg total) by mouth See admin instructions. Take with 5 mg for a total of 15 mg daily   No current facility-administered medications on file prior to visit.    Allergies:  Allergies  Allergen Reactions   Lactose Intolerance (Gi) Diarrhea    Social History:  Social History   Socioeconomic History   Marital status: Married    Spouse name: Not on file   Number of children: Not on file   Years of education: Not on file   Highest education level: High  school graduate  Occupational History   Not on file  Tobacco Use   Smoking status: Former    Current packs/day: 0.00    Average packs/day: 0.3 packs/day for 42.0 years (10.5 ttl pk-yrs)    Types: Cigars, Cigarettes    Start date: 05/18/1979    Quit date: 05/17/2021    Years since quitting: 2.0   Smokeless tobacco: Never  Vaping Use   Vaping status: Never Used  Substance and Sexual Activity   Alcohol use: No   Drug use: Yes    Types: Marijuana    Comment: pt states he smokes every once in a while   Sexual activity: Yes    Birth control/protection: None  Other Topics Concern   Not on file  Social History Narrative   Works part time.   Social Drivers of Corporate investment banker Strain: Low Risk  (05/23/2023)   Received from [facility]   Overall Financial Resource Strain (CARDIA)    Difficulty of Paying Living Expenses: Not hard at all  Food Insecurity: No Food Insecurity (06/20/2023)   Hunger Vital Sign    Worried About Running Out of Food in the Last Year: Never true    Ran Out of Food in the Last Year: Never true  Recent Concern: Food Insecurity - Food Insecurity Present (05/23/2023)   Received from [facility]   Hunger Vital Sign    Worried About Running Out of Food in the Last Year: Often true    Ran Out of Food in the Last Year: Sometimes true  Transportation Needs: No Transportation Needs (06/20/2023)   PRAPARE - Administrator, Civil Service (Medical): No    Lack of Transportation (Non-Medical): No  Physical Activity: Insufficiently Active (01/12/2022)   Exercise Vital Sign    Days of Exercise per Week: 3 days    Minutes of Exercise per Session: 30 min  Stress: Stress Concern Present (01/12/2022)   [employer] of Occupational Health - Occupational Stress Questionnaire    Feeling of Stress : To some extent  Social Connections: Moderately Integrated (01/12/2022)   Social Connection and Isolation Panel [NHANES]     Frequency of Communication with Friends and Family: Twice a week    Frequency of Social Gatherings with Friends and Family: Never    Attends Religious Services: More than 4 times per year    Active Member of [employer] or Organizations: Yes    Attends Engineer, structural: More than 4 times per year    Marital Status: Married  Catering manager Violence: Unknown (01/12/2022)   Humiliation, Afraid, Rape, and Kick questionnaire    Fear of Current or Ex-Partner: No    Emotionally Abused: No    Physically Abused: No    Sexually Abused: Not on file   Social History   Tobacco Use  Smoking Status Former   Current packs/day: 0.00   Average packs/day: 0.3 packs/day for 42.0 years (10.5 ttl pk-yrs)   Types: Cigars, Cigarettes   Start date: 05/18/1979   Quit date: 05/17/2021   Years since quitting: 2.0  Smokeless Tobacco Never   Social History   Substance and Sexual Activity  Alcohol Use No    Family History:  Family History  Problem Relation Age of Onset   Hypertension Mother    Diabetes Mother        lost both legs   Kidney disease Mother    Hypertension Father    Emphysema Father    Sickle cell trait Father    Hypertension Brother    Hyperlipidemia Brother    Sickle cell trait Sister     Past medical history, surgical history, medications, allergies, family history and social history reviewed with patient today and changes made to appropriate areas of the chart.   Review of Systems  Constitutional:  Positive for diaphoresis. Negative for chills, fever, malaise/fatigue and weight loss.  HENT: Negative.    Eyes: Negative.   Respiratory:  Positive for cough. Negative for hemoptysis, sputum production, shortness of breath and wheezing.   Cardiovascular: Negative.   Gastrointestinal: Negative.   Genitourinary:  Positive for hematuria. Negative for dysuria, flank pain, frequency and urgency.  Musculoskeletal: Negative.  Skin: Negative.   Neurological: Negative.    Endo/Heme/Allergies:  Positive for polydipsia. Negative for environmental allergies. Does not bruise/bleed easily.  Psychiatric/Behavioral: Negative.     All other ROS negative except what is listed above and in the HPI.      Objective:    BP 107/72 (BP Location: Left Arm, Patient Position: Sitting, Cuff Size: Large)   Pulse 70   Ht 6\' 1"  (1.854 m)   Wt 170 lb 3.2 oz (77.2 kg)   SpO2 98%   BMI 22.46 kg/m   Wt Readings from Last 3 Encounters:  06/20/23 170 lb 3.2 oz (77.2 kg)  05/17/23 172 lb 12.8 oz (78.4 kg)  05/09/23 155 lb (70.3 kg)    Physical Exam Vitals and nursing note reviewed.  Constitutional:      General: He is not in acute distress.    Appearance: Normal appearance. He is not ill-appearing, toxic-appearing or diaphoretic.  HENT:     Head: Normocephalic and atraumatic.     Right Ear: Tympanic membrane, ear canal and external ear normal. There is no impacted cerumen.     Left Ear: Tympanic membrane, ear canal and external ear normal. There is no impacted cerumen.     Nose: Nose normal. No congestion or rhinorrhea.     Mouth/Throat:     Mouth: Mucous membranes are moist.     Pharynx: Oropharynx is clear. No oropharyngeal exudate or posterior oropharyngeal erythema.  Eyes:     General: No scleral icterus.       Right eye: No discharge.        Left eye: No discharge.     Extraocular Movements: Extraocular movements intact.     Conjunctiva/sclera: Conjunctivae normal.     Pupils: Pupils are equal, round, and reactive to light.  Neck:     Vascular: No carotid bruit.  Cardiovascular:     Rate and Rhythm: Normal rate and regular rhythm.     Pulses: Normal pulses.     Heart sounds: No murmur heard.    No friction rub. No gallop.  Pulmonary:     Effort: Pulmonary effort is normal. No respiratory distress.     Breath sounds: Normal breath sounds. No stridor. No wheezing, rhonchi or rales.  Chest:     Chest wall: No tenderness.  Abdominal:     General: Abdomen is  flat. Bowel sounds are normal. There is no distension.     Palpations: Abdomen is soft. There is no mass.     Tenderness: There is no abdominal tenderness. There is no right CVA tenderness, left CVA tenderness, guarding or rebound.     Hernia: No hernia is present.  Genitourinary:    Comments: Genital exam deferred with shared decision making Musculoskeletal:        General: No swelling, tenderness, deformity or signs of injury.     Cervical back: Normal range of motion and neck supple. No rigidity. No muscular tenderness.     Right lower leg: No edema.     Left lower leg: No edema.  Lymphadenopathy:     Cervical: No cervical adenopathy.  Skin:    General: Skin is warm and dry.     Capillary Refill: Capillary refill takes less than 2 seconds.     Coloration: Skin is not jaundiced or pale.     Findings: No bruising, erythema, lesion or rash.  Neurological:     General: No focal deficit present.     Mental Status: He is alert and  oriented to person, place, and time.     Cranial Nerves: No cranial nerve deficit.     Sensory: No sensory deficit.     Motor: No weakness.     Coordination: Coordination normal.     Gait: Gait normal.     Deep Tendon Reflexes: Reflexes normal.  Psychiatric:        Mood and Affect: Mood normal.        Behavior: Behavior normal.        Thought Content: Thought content normal.        Judgment: Judgment normal.     Results for orders placed or performed in visit on 06/20/23  Microscopic Examination   Collection Time: 06/20/23  8:39 AM   Urine  Result Value Ref Range   WBC, UA 0-5 0 - 5 /hpf   RBC, Urine None seen 0 - 2 /hpf   Epithelial Cells (non renal) 0-10 0 - 10 /hpf  Microalbumin, Urine Waived   Collection Time: 06/20/23  8:39 AM  Result Value Ref Range   Microalb, Ur Waived 10 0 - 19 mg/L   Creatinine, Urine Waived 50 10 - 300 mg/dL   Microalb/Creat Ratio <30 <30 mg/g  Urinalysis, Routine w reflex microscopic   Collection Time: 06/20/23   8:39 AM  Result Value Ref Range   Specific Gravity, UA 1.015 1.005 - 1.030   pH, UA 7.0 5.0 - 7.5   Color, UA Yellow Yellow   Appearance Ur Clear Clear   Leukocytes,UA 1+ (A) Negative   Protein,UA Negative Negative/Trace   Glucose, UA Negative Negative   Ketones, UA Negative Negative   RBC, UA Negative Negative   Bilirubin, UA Negative Negative   Urobilinogen, Ur 0.2 0.2 - 1.0 mg/dL   Nitrite, UA Negative Negative   Microscopic Examination See below:       Assessment & Plan:   Problem List Items Addressed This Visit       Cardiovascular and Mediastinum   Aortic atherosclerosis (HCC)   Will keep BP and cholesterol under good control. Continue to monitor. Call with any concerns.       Relevant Medications   atorvastatin (LIPITOR) 20 MG tablet   losartan (COZAAR) 25 MG tablet   spironolactone (ALDACTONE) 25 MG tablet   digoxin (LANOXIN) 0.125 MG tablet   furosemide (LASIX) 40 MG tablet   Atrial fibrillation (HCC)   Continue to follow with cardiology. Labs drawn today. Call with any concerns.       Relevant Medications   atorvastatin (LIPITOR) 20 MG tablet   losartan (COZAAR) 25 MG tablet   spironolactone (ALDACTONE) 25 MG tablet   digoxin (LANOXIN) 0.125 MG tablet   furosemide (LASIX) 40 MG tablet   Other Relevant Orders   Comprehensive metabolic panel   CBC with Differential/Platelet   Congestive heart failure (HCC)   Euvolemic today. Continue to follow with cardiology. Labs drawn today. Call with any concerns.       Relevant Medications   atorvastatin (LIPITOR) 20 MG tablet   losartan (COZAAR) 25 MG tablet   spironolactone (ALDACTONE) 25 MG tablet   digoxin (LANOXIN) 0.125 MG tablet   furosemide (LASIX) 40 MG tablet   Other Relevant Orders   Comprehensive metabolic panel   CBC with Differential/Platelet   Microalbumin, Urine Waived (Completed)     Endocrine   Hyperthyroidism   Continue to follow with endocrinology. Call with any concerns. Continue to  monitor.       Relevant Medications   methimazole (  TAPAZOLE) 10 MG tablet   Other Relevant Orders   Comprehensive metabolic panel   CBC with Differential/Platelet   TSH     Genitourinary   BPH (benign prostatic hyperplasia)   Under good control on current regimen. Continue current regimen. Continue to monitor. Call with any concerns. Refills given. Labs drawn today.        Relevant Medications   tamsulosin (FLOMAX) 0.4 MG CAPS capsule   Other Relevant Orders   Comprehensive metabolic panel   CBC with Differential/Platelet   PSA     Other   Hyperlipidemia   Under good control on current regimen. Continue current regimen. Continue to monitor. Call with any concerns. Refills given. Labs drawn today.        Relevant Medications   atorvastatin (LIPITOR) 20 MG tablet   losartan (COZAAR) 25 MG tablet   spironolactone (ALDACTONE) 25 MG tablet   digoxin (LANOXIN) 0.125 MG tablet   furosemide (LASIX) 40 MG tablet   Other Relevant Orders   Comprehensive metabolic panel   CBC with Differential/Platelet   Lipid Panel w/o Chol/HDL Ratio   Status post amputation of great toe, left (HCC)   Doing well. Continue to follow with podiatry. Call with any concerns.       Other Visit Diagnoses       Encounter for Medicare annual wellness exam    -  Primary   Preventative care discussed today.     Routine general medical examination at a health care facility       Vaccines up to date. Screening labs checked today. Colonoscopy up to date. Continue diet and exercise. Call with any concerns.     Gross hematuria       Will recheck UA today. Await results.   Relevant Orders   Comprehensive metabolic panel   CBC with Differential/Platelet   Urinalysis, Routine w reflex microscopic (Completed)     Housing instability       Referral to [facility]I placed today.   Relevant Orders   AMB Referral [facility] Care Management     Open wound of right lower leg, initial encounter       Due for Td. Given  today.       LABORATORY TESTING:  Health maintenance labs ordered today as discussed above.   The natural history of prostate cancer and ongoing controversy regarding screening and potential treatment outcomes of prostate cancer has been discussed with the patient. The meaning of a false positive PSA and a false negative PSA has been discussed. He indicates understanding of the limitations of this screening test and wishes to proceed with screening PSA testing.   IMMUNIZATIONS:   - Tdap: Tetanus vaccination status reviewed: Td vaccination indicated and given today. - Influenza: Up to date - Pneumovax: Up to date - Prevnar: Up to date - COVID: Refused - HPV: Not applicable - Shingrix vaccine: Refused  SCREENING: - Colonoscopy: Up to date  Discussed with patient purpose of the colonoscopy is to detect colon cancer at curable precancerous or early stages   PATIENT COUNSELING:    Sexuality: Discussed sexually transmitted diseases, partner selection, use of condoms, avoidance of unintended pregnancy  and contraceptive alternatives.   Advised to avoid cigarette smoking.  I discussed with the patient that most people either abstain from alcohol or drink within safe limits (<=14/week and <=4 drinks/occasion for males, <=7/weeks and <= 3 drinks/occasion for females) and that the risk for alcohol disorders and other health effects rises proportionally with the number of  drinks per week and how often a drinker exceeds daily limits.  Discussed cessation/primary prevention of drug use and availability of treatment for abuse.   Diet: Encouraged to adjust caloric intake to maintain  or achieve ideal body weight, to reduce intake of dietary saturated fat and total fat, to limit sodium intake by avoiding high sodium foods and not adding table salt, and to maintain adequate dietary potassium and calcium preferably from fresh fruits, vegetables, and low-fat dairy products.    stressed the importance  of regular exercise  Injury prevention: Discussed safety belts, safety helmets, smoke detector, smoking near bedding or upholstery.   Dental health: Discussed importance of regular tooth brushing, flossing, and dental visits.   Follow up plan: NEXT PREVENTATIVE PHYSICAL DUE IN 1 YEAR. Return in about 6 months (around 12/21/2023).

## 2023-06-20 NOTE — Assessment & Plan Note (Signed)
Continue to follow with endocrinology. Call with any concerns. Continue to monitor.  ?

## 2023-06-21 LAB — COMPREHENSIVE METABOLIC PANEL
ALT: 13 IU/L (ref 0–44)
AST: 14 IU/L (ref 0–40)
Albumin: 4.2 g/dL (ref 3.9–4.9)
Alkaline Phosphatase: 108 IU/L (ref 44–121)
BUN/Creatinine Ratio: 16 (ref 10–24)
BUN: 14 mg/dL (ref 8–27)
Bilirubin Total: 0.5 mg/dL (ref 0.0–1.2)
CO2: 23 mmol/L (ref 20–29)
Calcium: 9.5 mg/dL (ref 8.6–10.2)
Chloride: 104 mmol/L (ref 96–106)
Creatinine, Ser: 0.9 mg/dL (ref 0.76–1.27)
Globulin, Total: 2.6 g/dL (ref 1.5–4.5)
Glucose: 90 mg/dL (ref 70–99)
Potassium: 4.1 mmol/L (ref 3.5–5.2)
Sodium: 139 mmol/L (ref 134–144)
Total Protein: 6.8 g/dL (ref 6.0–8.5)
eGFR: 94 mL/min/{1.73_m2} (ref >59)

## 2023-06-21 LAB — CBC WITH DIFFERENTIAL/PLATELET
Basophils Absolute: 0 10*3/uL (ref 0.0–0.2)
Basos: 1 %
EOS (ABSOLUTE): 0.2 10*3/uL (ref 0.0–0.4)
Eos: 4 %
Hematocrit: 46.2 % (ref 37.5–51.0)
Hemoglobin: 14.9 g/dL (ref 13.0–17.7)
Immature Grans (Abs): 0 10*3/uL (ref 0.0–0.1)
Immature Granulocytes: 0 %
Lymphocytes Absolute: 0.8 10*3/uL (ref 0.7–3.1)
Lymphs: 21 %
MCH: 31 pg (ref 26.6–33.0)
MCHC: 32.3 g/dL (ref 31.5–35.7)
MCV: 96 fL (ref 79–97)
Monocytes Absolute: 0.4 10*3/uL (ref 0.1–0.9)
Monocytes: 9 %
Neutrophils Absolute: 2.7 10*3/uL (ref 1.4–7.0)
Neutrophils: 65 %
Platelets: 212 10*3/uL (ref 150–450)
RBC: 4.81 x10E6/uL (ref 4.14–5.80)
RDW: 12 % (ref 11.6–15.4)
WBC: 4.1 10*3/uL (ref 3.4–10.8)

## 2023-06-21 LAB — LIPID PANEL W/O CHOL/HDL RATIO
Cholesterol, Total: 175 mg/dL (ref 100–199)
HDL: 48 mg/dL (ref >39)
LDL Chol Calc (NIH): 115 mg/dL — ABNORMAL HIGH (ref 0–99)
Triglycerides: 62 mg/dL (ref 0–149)
VLDL Cholesterol Cal: 12 mg/dL (ref 5–40)

## 2023-06-21 LAB — TSH: TSH: <0.005 u[IU]/mL — ABNORMAL LOW (ref 0.450–4.500)

## 2023-06-21 LAB — PSA: Prostate Specific Ag, Serum: 0.4 ng/mL (ref 0.0–4.0)

## 2023-06-22 ENCOUNTER — Telehealth: Payer: Self-pay | Admitting: *Deleted

## 2023-06-22 NOTE — Progress Notes (Signed)
 Complex Care Management Note Care Guide Note  06/22/2023 Name: Tyler Cohen. MRN: [id number] DOB: Mar 04, 1956   Complex Care Management Outreach Attempts: An unsuccessful telephone outreach was attempted today to offer the patient information about available complex care management services.  Follow Up Plan:  Additional outreach attempts will be made to offer the patient complex care management information and services.   Encounter Outcome:  No Answer  [facility] Health Care Guide  Direct Dial:[phone] Fax:[phone] Website: [email or web address]

## 2023-06-23 ENCOUNTER — Telehealth: Payer: Self-pay | Admitting: *Deleted

## 2023-06-23 NOTE — Progress Notes (Signed)
 Complex Care Management Note Care Guide Note  06/23/2023 Name: Tyler Cohen. MRN: [id number] DOB: 21-Feb-1956  [name]. is a 68 y.o. year old male who is a primary care patient of Dorcas Carrow, DO . The community resource team was consulted for assistance with Food Insecurity and Housing   SDOH screenings and interventions completed:  Yes  Social Drivers of Health From This Encounter   Food Insecurity: Food Insecurity Present (06/23/2023)   Hunger Vital Sign    Worried About Running Out of Food in the Last Year: Sometimes true    Ran Out of Food in the Last Year: Sometimes true  Housing: Unknown (06/23/2023)   Housing Stability Vital Sign    Unable to Pay for Housing in the Last Year: No    Homeless in the Last Year: No    SDOH Interventions Today    Flowsheet Row Most Recent Value  SDOH Interventions   Food Insecurity Interventions Community Resources Provided  [patient gets 23 in foodstamps will mail a list of food banks]  Housing Interventions [employer] Provided, Other (Comment)  [Provided lists of income based housing in the area]        Care guide performed the following interventions: Patient provided with information about care guide support team and interviewed to confirm resource needs.  Follow Up Plan:  No further follow up planned at this time. The patient has been provided with needed resources.  Encounter Outcome:  Patient Visit Completed  [name]  [facility] HealthPopulation Health Care Guide  Direct Dial:[phone] Fax:[phone] Website: [email or web address]

## 2023-06-24 ENCOUNTER — Encounter: Payer: Self-pay | Admitting: Family Medicine

## 2023-06-27 NOTE — Progress Notes (Signed)
 Letter printed and mailed.

## 2023-06-27 NOTE — Progress Notes (Signed)
Results printed and mailed.   

## 2023-06-29 ENCOUNTER — Other Ambulatory Visit: Payer: Self-pay | Admitting: Family Medicine

## 2023-07-01 NOTE — Telephone Encounter (Signed)
 Requested medication (s) are due for refill today: routing for review  Requested medication (s) are on the active medication list: yes  Last refill:  06/20/23  Future visit scheduled: no  Notes to clinic:  Unable to refill per protocol, last refill by historical provider.      Requested Prescriptions  Pending Prescriptions Disp Refills   furosemide (LASIX) 40 MG tablet [Pharmacy Med Name: FUROSEMIDE 40 MG TAB] 90 tablet     Sig: TAKE 1 TABLET BY MOUTH ONCE DAILY     Cardiovascular:  Diuretics - Loop Failed - 07/01/2023 11:14 AM      Failed - Mg Level in normal range and within 180 days    Magnesium  Date Value Ref Range Status  05/15/2021 2.3 1.7 - 2.4 mg/dL Final    Comment:    Performed at [facility], [address] [place], [place] [address]         Passed - K in normal range and within 180 days    Potassium  Date Value Ref Range Status  06/20/2023 4.1 3.5 - 5.2 mmol/L Final         Passed - Ca in normal range and within 180 days    Calcium  Date Value Ref Range Status  06/20/2023 9.5 8.6 - 10.2 mg/dL Final         Passed - Na in normal range and within 180 days    Sodium  Date Value Ref Range Status  06/20/2023 139 134 - 144 mmol/L Final         Passed - Cr in normal range and within 180 days    Creatinine, Ser  Date Value Ref Range Status  06/20/2023 0.90 0.76 - 1.27 mg/dL Final         Passed - Cl in normal range and within 180 days    Chloride  Date Value Ref Range Status  06/20/2023 104 96 - 106 mmol/L Final         Passed - Last BP in normal range    BP Readings from Last 1 Encounters:  06/20/23 107/72         Passed - Valid encounter within last 6 months    Recent Outpatient Visits           1 week ago Encounter for [employer] annual wellness exam   [facility] [place], Megan P, DO   1 month ago Preop exam for internal medicine   [facility] [place], Megan P, DO

## 2023-08-08 DIAGNOSIS — E0521 Thyrotoxicosis with toxic multinodular goiter with thyrotoxic crisis or storm: Secondary | ICD-10-CM | POA: Diagnosis not present

## 2023-08-08 DIAGNOSIS — Z8679 Personal history of other diseases of the circulatory system: Secondary | ICD-10-CM | POA: Diagnosis not present

## 2023-08-08 DIAGNOSIS — I5020 Unspecified systolic (congestive) heart failure: Secondary | ICD-10-CM | POA: Diagnosis not present

## 2023-08-08 DIAGNOSIS — I5022 Chronic systolic (congestive) heart failure: Secondary | ICD-10-CM | POA: Diagnosis not present

## 2023-08-08 DIAGNOSIS — I739 Peripheral vascular disease, unspecified: Secondary | ICD-10-CM | POA: Diagnosis not present

## 2023-08-08 DIAGNOSIS — I429 Cardiomyopathy, unspecified: Secondary | ICD-10-CM | POA: Diagnosis not present

## 2023-08-08 DIAGNOSIS — Z720 Tobacco use: Secondary | ICD-10-CM | POA: Diagnosis not present

## 2023-08-08 DIAGNOSIS — E785 Hyperlipidemia, unspecified: Secondary | ICD-10-CM | POA: Diagnosis not present

## 2023-08-08 DIAGNOSIS — I480 Paroxysmal atrial fibrillation: Secondary | ICD-10-CM | POA: Diagnosis not present

## 2023-11-01 ENCOUNTER — Other Ambulatory Visit: Payer: Self-pay | Admitting: Family Medicine

## 2023-11-02 NOTE — Telephone Encounter (Signed)
 Rx 06/20/23 #45 1RF- too soon Requested Prescriptions  Pending Prescriptions Disp Refills   spironolactone  (ALDACTONE ) 25 MG tablet [Pharmacy Med Name: SPIRONOLACTONE  25 MG TAB] 45 tablet 1    Sig: TAKE 1/2 TABLET BY MOUTH ONCE DAILY     Cardiovascular: Diuretics - Aldosterone Antagonist Passed - 11/02/2023  2:09 PM      Passed - Cr in normal range and within 180 days    Creatinine, Ser  Date Value Ref Range Status  06/20/2023 0.90 0.76 - 1.27 mg/dL Final         Passed - K in normal range and within 180 days    Potassium  Date Value Ref Range Status  06/20/2023 4.1 3.5 - 5.2 mmol/L Final         Passed - Na in normal range and within 180 days    Sodium  Date Value Ref Range Status  06/20/2023 139 134 - 144 mmol/L Final         Passed - eGFR is 30 or above and within 180 days    GFR calc Af Amer  Date Value Ref Range Status  02/25/2020 104 >59 mL/min/1.73 Final    Comment:    **In accordance with recommendations from the NKF-ASN Task force,**   [facility] is in the process of updating its eGFR calculation to the   2021 CKD-EPI creatinine equation that estimates kidney function   without a race variable.    GFR, Estimated  Date Value Ref Range Status  09/10/2022 >60 >60 mL/min Final    Comment:    (NOTE) Calculated using the CKD-EPI Creatinine Equation (2021)    eGFR  Date Value Ref Range Status  06/20/2023 94 >59 mL/min/1.73 Final         Passed - Last BP in normal range    BP Readings from Last 1 Encounters:  06/20/23 107/72         Passed - Valid encounter within last 6 months    Recent Outpatient Visits           4 months ago Encounter for [employer] annual wellness exam   [place] [facility] [place], Megan P, DO   5 months ago Preop exam for internal medicine    [facility], Megan P, DO

## 2023-11-04 ENCOUNTER — Other Ambulatory Visit: Payer: Self-pay | Admitting: Family Medicine

## 2023-11-04 NOTE — Telephone Encounter (Signed)
 Requested Prescriptions  Pending Prescriptions Disp Refills   albuterol  (VENTOLIN  HFA) 108 (90 Base) MCG/ACT inhaler [Pharmacy Med Name: ALBUTEROL  SULFATE HFA 108 (90 BASE)] 8.5 g 3    Sig: INHALE 2 PUFFS INTO THE LUNGS EVERY 6 HOURS AS NEEDED FOR WHEEZING OR SHORTNESS OF BREATH     Pulmonology:  Beta Agonists 2 Passed - 11/04/2023  5:27 PM      Passed - Last BP in normal range    BP Readings from Last 1 Encounters:  06/20/23 107/72         Passed - Last Heart Rate in normal range    Pulse Readings from Last 1 Encounters:  06/20/23 70         Passed - Valid encounter within last 12 months    Recent Outpatient Visits           4 months ago Encounter for [employer] annual wellness exam   [facility] [place], Megan P, DO   5 months ago Preop exam for internal medicine   [place] [facility] [place], Megan P, DO

## 2023-12-21 ENCOUNTER — Ambulatory Visit: Admitting: Family Medicine

## 2023-12-24 ENCOUNTER — Other Ambulatory Visit: Payer: Self-pay

## 2023-12-24 ENCOUNTER — Encounter: Payer: Self-pay | Admitting: *Deleted

## 2023-12-24 ENCOUNTER — Emergency Department

## 2023-12-24 ENCOUNTER — Inpatient Hospital Stay
Admission: EM | Admit: 2023-12-24 | Discharge: 2023-12-26 | DRG: 286 | Disposition: A | Discharge status: Other Acute Inpatient Hospital | Attending: Student in an Organized Health Care Education/Training Program | Admitting: Student in an Organized Health Care Education/Training Program

## 2023-12-24 DIAGNOSIS — J449 Chronic obstructive pulmonary disease, unspecified: Secondary | ICD-10-CM | POA: Diagnosis present

## 2023-12-24 DIAGNOSIS — Z832 Family history of diseases of the blood and blood-forming organs and certain disorders involving the immune mechanism: Secondary | ICD-10-CM

## 2023-12-24 DIAGNOSIS — R002 Palpitations: Secondary | ICD-10-CM | POA: Diagnosis not present

## 2023-12-24 DIAGNOSIS — Z91148 Patient's other noncompliance with medication regimen for other reason: Secondary | ICD-10-CM

## 2023-12-24 DIAGNOSIS — R0602 Shortness of breath: Secondary | ICD-10-CM | POA: Diagnosis not present

## 2023-12-24 DIAGNOSIS — Z7984 Long term (current) use of oral hypoglycemic drugs: Secondary | ICD-10-CM

## 2023-12-24 DIAGNOSIS — I4891 Unspecified atrial fibrillation: Secondary | ICD-10-CM | POA: Diagnosis not present

## 2023-12-24 DIAGNOSIS — Z841 Family history of disorders of kidney and ureter: Secondary | ICD-10-CM | POA: Diagnosis not present

## 2023-12-24 DIAGNOSIS — Z83438 Family history of other disorder of lipoprotein metabolism and other lipidemia: Secondary | ICD-10-CM

## 2023-12-24 DIAGNOSIS — R911 Solitary pulmonary nodule: Secondary | ICD-10-CM | POA: Diagnosis not present

## 2023-12-24 DIAGNOSIS — R570 Cardiogenic shock: Secondary | ICD-10-CM | POA: Diagnosis not present

## 2023-12-24 DIAGNOSIS — Z79899 Other long term (current) drug therapy: Secondary | ICD-10-CM

## 2023-12-24 DIAGNOSIS — Z8249 Family history of ischemic heart disease and other diseases of the circulatory system: Secondary | ICD-10-CM

## 2023-12-24 DIAGNOSIS — I429 Cardiomyopathy, unspecified: Secondary | ICD-10-CM | POA: Diagnosis not present

## 2023-12-24 DIAGNOSIS — E872 Acidosis, unspecified: Secondary | ICD-10-CM | POA: Diagnosis not present

## 2023-12-24 DIAGNOSIS — I4892 Unspecified atrial flutter: Principal | ICD-10-CM | POA: Diagnosis present

## 2023-12-24 DIAGNOSIS — I110 Hypertensive heart disease with heart failure: Secondary | ICD-10-CM | POA: Diagnosis not present

## 2023-12-24 DIAGNOSIS — E739 Lactose intolerance, unspecified: Secondary | ICD-10-CM | POA: Diagnosis present

## 2023-12-24 DIAGNOSIS — Z825 Family history of asthma and other chronic lower respiratory diseases: Secondary | ICD-10-CM

## 2023-12-24 DIAGNOSIS — I5023 Acute on chronic systolic (congestive) heart failure: Secondary | ICD-10-CM | POA: Diagnosis not present

## 2023-12-24 DIAGNOSIS — Z923 Personal history of irradiation: Secondary | ICD-10-CM | POA: Diagnosis not present

## 2023-12-24 DIAGNOSIS — E039 Hypothyroidism, unspecified: Secondary | ICD-10-CM | POA: Diagnosis not present

## 2023-12-24 DIAGNOSIS — I428 Other cardiomyopathies: Secondary | ICD-10-CM | POA: Diagnosis present

## 2023-12-24 DIAGNOSIS — E0590 Thyrotoxicosis, unspecified without thyrotoxic crisis or storm: Secondary | ICD-10-CM | POA: Diagnosis present

## 2023-12-24 DIAGNOSIS — R079 Chest pain, unspecified: Secondary | ICD-10-CM | POA: Diagnosis not present

## 2023-12-24 DIAGNOSIS — Z452 Encounter for adjustment and management of vascular access device: Secondary | ICD-10-CM | POA: Diagnosis not present

## 2023-12-24 DIAGNOSIS — Z89422 Acquired absence of other left toe(s): Secondary | ICD-10-CM

## 2023-12-24 DIAGNOSIS — Z833 Family history of diabetes mellitus: Secondary | ICD-10-CM

## 2023-12-24 DIAGNOSIS — I480 Paroxysmal atrial fibrillation: Secondary | ICD-10-CM | POA: Diagnosis not present

## 2023-12-24 DIAGNOSIS — Z7901 Long term (current) use of anticoagulants: Secondary | ICD-10-CM | POA: Diagnosis not present

## 2023-12-24 DIAGNOSIS — N400 Enlarged prostate without lower urinary tract symptoms: Secondary | ICD-10-CM | POA: Diagnosis present

## 2023-12-24 DIAGNOSIS — I340 Nonrheumatic mitral (valve) insufficiency: Secondary | ICD-10-CM | POA: Diagnosis not present

## 2023-12-24 DIAGNOSIS — I700 Atherosclerosis of aorta: Secondary | ICD-10-CM | POA: Diagnosis not present

## 2023-12-24 DIAGNOSIS — R579 Shock, unspecified: Secondary | ICD-10-CM | POA: Diagnosis not present

## 2023-12-24 DIAGNOSIS — I739 Peripheral vascular disease, unspecified: Secondary | ICD-10-CM | POA: Diagnosis not present

## 2023-12-24 DIAGNOSIS — E785 Hyperlipidemia, unspecified: Secondary | ICD-10-CM | POA: Diagnosis present

## 2023-12-24 DIAGNOSIS — E0500 Thyrotoxicosis with diffuse goiter without thyrotoxic crisis or storm: Secondary | ICD-10-CM | POA: Diagnosis not present

## 2023-12-24 DIAGNOSIS — Z87891 Personal history of nicotine dependence: Secondary | ICD-10-CM

## 2023-12-24 DIAGNOSIS — N170 Acute kidney failure with tubular necrosis: Secondary | ICD-10-CM | POA: Diagnosis not present

## 2023-12-24 DIAGNOSIS — Q2112 Patent foramen ovale: Secondary | ICD-10-CM | POA: Diagnosis not present

## 2023-12-24 DIAGNOSIS — I517 Cardiomegaly: Secondary | ICD-10-CM | POA: Diagnosis not present

## 2023-12-24 DIAGNOSIS — R531 Weakness: Secondary | ICD-10-CM | POA: Diagnosis not present

## 2023-12-24 DIAGNOSIS — I509 Heart failure, unspecified: Secondary | ICD-10-CM | POA: Diagnosis not present

## 2023-12-24 DIAGNOSIS — R9431 Abnormal electrocardiogram [ECG] [EKG]: Secondary | ICD-10-CM | POA: Diagnosis not present

## 2023-12-24 DIAGNOSIS — M1990 Unspecified osteoarthritis, unspecified site: Secondary | ICD-10-CM | POA: Diagnosis not present

## 2023-12-24 DIAGNOSIS — I4819 Other persistent atrial fibrillation: Secondary | ICD-10-CM | POA: Diagnosis not present

## 2023-12-24 DIAGNOSIS — I2489 Other forms of acute ischemic heart disease: Secondary | ICD-10-CM | POA: Diagnosis present

## 2023-12-24 DIAGNOSIS — I5020 Unspecified systolic (congestive) heart failure: Secondary | ICD-10-CM | POA: Diagnosis not present

## 2023-12-24 DIAGNOSIS — R918 Other nonspecific abnormal finding of lung field: Secondary | ICD-10-CM | POA: Diagnosis not present

## 2023-12-24 DIAGNOSIS — Z8546 Personal history of malignant neoplasm of prostate: Secondary | ICD-10-CM

## 2023-12-24 LAB — CBC
HCT: 41.7 % (ref 39.0–52.0)
Hemoglobin: 13.9 g/dL (ref 13.0–17.0)
MCH: 31.9 pg (ref 26.0–34.0)
MCHC: 33.3 g/dL (ref 30.0–36.0)
MCV: 95.6 fL (ref 80.0–100.0)
Platelets: 150 K/uL (ref 150–400)
RBC: 4.36 MIL/uL (ref 4.22–5.81)
RDW: 12.3 % (ref 11.5–15.5)
WBC: 5.7 K/uL (ref 4.0–10.5)
nRBC: 0 % (ref 0.0–0.2)

## 2023-12-24 LAB — DIGOXIN LEVEL: Digoxin Level: <0.4 ng/mL — ABNORMAL LOW (ref 0.8–2.0)

## 2023-12-24 LAB — GLUCOSE, CAPILLARY
Glucose-Capillary: 130 mg/dL — ABNORMAL HIGH (ref 70–99)
Glucose-Capillary: 130 mg/dL — ABNORMAL HIGH (ref 70–99)

## 2023-12-24 LAB — URINE DRUG SCREEN, QUALITATIVE (ARMC ONLY)
Amphetamines, Ur Screen: NOT DETECTED
Barbiturates, Ur Screen: NOT DETECTED
Benzodiazepine, Ur Scrn: NOT DETECTED
Cannabinoid 50 Ng, Ur ~~LOC~~: POSITIVE — AB
Cocaine Metabolite,Ur ~~LOC~~: NOT DETECTED
MDMA (Ecstasy)Ur Screen: NOT DETECTED
Methadone Scn, Ur: NOT DETECTED
Opiate, Ur Screen: NOT DETECTED
Phencyclidine (PCP) Ur S: NOT DETECTED
Tricyclic, Ur Screen: NOT DETECTED

## 2023-12-24 LAB — TROPONIN I (HIGH SENSITIVITY)
Troponin I (High Sensitivity): 20 ng/L — ABNORMAL HIGH (ref <18)
Troponin I (High Sensitivity): 20 ng/L — ABNORMAL HIGH (ref <18)

## 2023-12-24 LAB — BASIC METABOLIC PANEL WITH GFR
Anion gap: 12 (ref 5–15)
BUN: 11 mg/dL (ref 8–23)
CO2: 23 mmol/L (ref 22–32)
Calcium: 8.8 mg/dL — ABNORMAL LOW (ref 8.9–10.3)
Chloride: 104 mmol/L (ref 98–111)
Creatinine, Ser: 1.12 mg/dL (ref 0.61–1.24)
GFR, Estimated: >60 mL/min (ref >60)
Glucose, Bld: 127 mg/dL — ABNORMAL HIGH (ref 70–99)
Potassium: 3.7 mmol/L (ref 3.5–5.1)
Sodium: 139 mmol/L (ref 135–145)

## 2023-12-24 LAB — D-DIMER, QUANTITATIVE: D-Dimer, Quant: 0.43 ug{FEU}/mL (ref 0.00–0.50)

## 2023-12-24 LAB — MAGNESIUM: Magnesium: 1.8 mg/dL (ref 1.7–2.4)

## 2023-12-24 LAB — MRSA NEXT GEN BY PCR, NASAL: MRSA by PCR Next Gen: NOT DETECTED

## 2023-12-24 LAB — BRAIN NATRIURETIC PEPTIDE: B Natriuretic Peptide: 818.6 pg/mL — ABNORMAL HIGH (ref 0.0–100.0)

## 2023-12-24 LAB — T4, FREE: Free T4: 2.32 ng/dL — ABNORMAL HIGH (ref 0.61–1.12)

## 2023-12-24 LAB — CORTISOL: Cortisol, Plasma: 23.6 ug/dL

## 2023-12-24 LAB — TSH: TSH: <0.1 u[IU]/mL — ABNORMAL LOW (ref 0.350–4.500)

## 2023-12-24 LAB — HIV ANTIBODY (ROUTINE TESTING W REFLEX): HIV Screen 4th Generation wRfx: NONREACTIVE

## 2023-12-24 MED ORDER — METOPROLOL TARTRATE 25 MG PO TABS: 25.0000 mg | ORAL_TABLET | Freq: Two times a day (BID) | ORAL | Status: DC

## 2023-12-24 MED ORDER — SODIUM CHLORIDE 0.9 % IV BOLUS
250.0000 mL | Freq: Once | INTRAVENOUS | Status: AC
  Administered 2023-12-24: 250 mL via INTRAVENOUS

## 2023-12-24 MED ORDER — BENZOCAINE-MENTHOL 6-10 MG MT LOZG: 1.0000 | LOZENGE | OROMUCOSAL | Status: DC | PRN

## 2023-12-24 MED ORDER — METHIMAZOLE 5 MG PO TABS
15.0000 mg | ORAL_TABLET | Freq: Every day | ORAL | Status: DC
  Administered 2023-12-24 – 2023-12-25 (×2): 15 mg via ORAL
  Filled 2023-12-24 (×3): qty 1

## 2023-12-24 MED ORDER — DIGOXIN 0.25 MG/ML IJ SOLN
0.1250 mg | Freq: Four times a day (QID) | INTRAMUSCULAR | Status: AC
  Administered 2023-12-24 – 2023-12-25 (×4): 0.125 mg via INTRAVENOUS
  Filled 2023-12-24 (×6): qty 2

## 2023-12-24 MED ORDER — ATORVASTATIN CALCIUM 20 MG PO TABS
20.0000 mg | ORAL_TABLET | Freq: Every day | ORAL | Status: DC
  Administered 2023-12-25: 20 mg via ORAL
  Filled 2023-12-24: qty 1

## 2023-12-24 MED ORDER — INSULIN ASPART 100 UNIT/ML IJ SOLN
0.0000 [IU] | Freq: Three times a day (TID) | INTRAMUSCULAR | Status: DC
  Administered 2023-12-25: 3 [IU] via SUBCUTANEOUS
  Administered 2023-12-25: 2 [IU] via SUBCUTANEOUS
  Administered 2023-12-25: 3 [IU] via SUBCUTANEOUS
  Filled 2023-12-24 (×2): qty 1

## 2023-12-24 MED ORDER — PROPRANOLOL HCL 20 MG PO TABS
10.0000 mg | ORAL_TABLET | Freq: Three times a day (TID) | ORAL | Status: DC
  Administered 2023-12-24: 10 mg via ORAL
  Filled 2023-12-24: qty 1

## 2023-12-24 MED ORDER — DIGOXIN 125 MCG PO TABS: 125.0000 ug | ORAL_TABLET | Freq: Every day | ORAL | Status: DC

## 2023-12-24 MED ORDER — DILTIAZEM HCL-DEXTROSE 125-5 MG/125ML-% IV SOLN (PREMIX)
5.0000 mg/h | INTRAVENOUS | Status: DC
  Administered 2023-12-24: 10 mg/h via INTRAVENOUS
  Filled 2023-12-24: qty 125

## 2023-12-24 MED ORDER — IPRATROPIUM BROMIDE 0.02 % IN SOLN: 0.5000 mg | Freq: Four times a day (QID) | RESPIRATORY_TRACT | Status: DC | PRN

## 2023-12-24 MED ORDER — METHIMAZOLE 5 MG PO TABS
15.0000 mg | ORAL_TABLET | Freq: Every day | ORAL | Status: DC
Start: 2023-12-25 — End: 2023-12-24

## 2023-12-24 MED ORDER — APIXABAN 5 MG PO TABS
5.0000 mg | ORAL_TABLET | Freq: Two times a day (BID) | ORAL | Status: DC
  Administered 2023-12-24 – 2023-12-25 (×3): 5 mg via ORAL
  Filled 2023-12-24 (×3): qty 1

## 2023-12-24 MED ORDER — FLUTICASONE PROPIONATE 50 MCG/ACT NA SUSP
1.0000 | Freq: Every day | NASAL | Status: DC
  Administered 2023-12-24 – 2023-12-25 (×2): 1 via NASAL
  Filled 2023-12-24: qty 16

## 2023-12-24 MED ORDER — SODIUM CHLORIDE 0.9 % IV SOLN: Freq: Once | INTRAVENOUS | Status: DC

## 2023-12-24 MED ORDER — METOPROLOL TARTRATE 5 MG/5ML IV SOLN: 5.0000 mg | Freq: Once | INTRAVENOUS | Status: DC

## 2023-12-24 MED ORDER — ACETAMINOPHEN 650 MG RE SUPP: 650.0000 mg | Freq: Four times a day (QID) | RECTAL | Status: DC | PRN

## 2023-12-24 MED ORDER — ONDANSETRON HCL 4 MG PO TABS: 4.0000 mg | ORAL_TABLET | Freq: Four times a day (QID) | ORAL | Status: DC | PRN

## 2023-12-24 MED ORDER — DIGOXIN 0.25 MG/ML IJ SOLN
0.1250 mg | Freq: Once | INTRAMUSCULAR | Status: DC
  Filled 2023-12-24: qty 2

## 2023-12-24 MED ORDER — BENZONATATE 100 MG PO CAPS: 100.0000 mg | ORAL_CAPSULE | Freq: Three times a day (TID) | ORAL | Status: DC | PRN

## 2023-12-24 MED ORDER — DILTIAZEM HCL-DEXTROSE 125-5 MG/125ML-% IV SOLN (PREMIX)
5.0000 mg/h | INTRAVENOUS | Status: DC
Start: 2023-12-24 — End: 2023-12-24

## 2023-12-24 MED ORDER — SPIRONOLACTONE 12.5 MG HALF TABLET
12.5000 mg | ORAL_TABLET | Freq: Every day | ORAL | Status: DC
Start: 2023-12-25 — End: 2023-12-24

## 2023-12-24 MED ORDER — METOPROLOL TARTRATE 5 MG/5ML IV SOLN: 5.0000 mg | INTRAVENOUS | Status: DC

## 2023-12-24 MED ORDER — LOSARTAN POTASSIUM 50 MG PO TABS
25.0000 mg | ORAL_TABLET | Freq: Every day | ORAL | Status: DC
Start: 2023-12-25 — End: 2023-12-24

## 2023-12-24 MED ORDER — EMPAGLIFLOZIN 10 MG PO TABS
10.0000 mg | ORAL_TABLET | Freq: Every day | ORAL | Status: DC
  Administered 2023-12-25: 10 mg via ORAL
  Filled 2023-12-24 (×2): qty 1

## 2023-12-24 MED ORDER — PROPRANOLOL HCL 20 MG PO TABS: 10.0000 mg | ORAL_TABLET | Freq: Three times a day (TID) | ORAL | Status: DC

## 2023-12-24 MED ORDER — DILTIAZEM LOAD VIA INFUSION
15.0000 mg | Freq: Once | INTRAVENOUS | Status: AC
  Administered 2023-12-24: 15 mg via INTRAVENOUS
  Filled 2023-12-24: qty 15

## 2023-12-24 MED ORDER — GUAIFENESIN ER 600 MG PO TB12
1200.0000 mg | ORAL_TABLET | Freq: Two times a day (BID) | ORAL | Status: DC
  Administered 2023-12-24 – 2023-12-25 (×3): 1200 mg via ORAL
  Filled 2023-12-24 (×3): qty 2

## 2023-12-24 MED ORDER — BUDESONIDE 0.25 MG/2ML IN SUSP
0.2500 mg | Freq: Two times a day (BID) | RESPIRATORY_TRACT | Status: DC
  Administered 2023-12-24 – 2023-12-25 (×2): 0.25 mg via RESPIRATORY_TRACT
  Filled 2023-12-24 (×2): qty 2

## 2023-12-24 MED ORDER — METOPROLOL TARTRATE 5 MG/5ML IV SOLN
5.0000 mg | INTRAVENOUS | Status: DC | PRN
  Administered 2023-12-24 – 2023-12-25 (×2): 5 mg via INTRAVENOUS
  Filled 2023-12-24 (×3): qty 5

## 2023-12-24 MED ORDER — ONDANSETRON HCL 4 MG/2ML IJ SOLN
4.0000 mg | Freq: Four times a day (QID) | INTRAMUSCULAR | Status: DC | PRN
  Administered 2023-12-24 – 2023-12-25 (×2): 4 mg via INTRAVENOUS
  Filled 2023-12-24 (×3): qty 2

## 2023-12-24 MED ORDER — ACETAMINOPHEN 325 MG PO TABS
650.0000 mg | ORAL_TABLET | Freq: Four times a day (QID) | ORAL | Status: DC | PRN
  Administered 2023-12-24 (×2): 650 mg via ORAL
  Filled 2023-12-24 (×3): qty 2

## 2023-12-24 MED ORDER — HYDROCORTISONE SOD SUC (PF) 100 MG IJ SOLR
100.0000 mg | Freq: Two times a day (BID) | INTRAMUSCULAR | Status: DC
  Administered 2023-12-24 – 2023-12-25 (×2): 100 mg via INTRAVENOUS
  Filled 2023-12-24 (×2): qty 2

## 2023-12-24 MED ORDER — TAMSULOSIN HCL 0.4 MG PO CAPS
0.4000 mg | ORAL_CAPSULE | Freq: Every day | ORAL | Status: DC
  Administered 2023-12-25: 0.4 mg via ORAL
  Filled 2023-12-24: qty 1

## 2023-12-24 NOTE — ED Notes (Signed)
 Family asked x2 to stop going through cabinets and supplies. Patient resting in bed and in no need however family picking through supplies on wall. Blanket provided as requested and water  given. MD Zhang at the bedside for consult.

## 2023-12-24 NOTE — ED Notes (Signed)
 MD Laurita was consulted regarding plan of care and the changing of medications from Cardiology recommendations per MD Nicholaus in the ER who consulted with them. Ordered to stop Cardizem  and give po Inderal . Completed and patient maintained HR >145. Same relaid to MD Zhang after attempting Lopressor  5mg  IV push. HR maintained 145 after lopressor , inderal . MD Laurita asked for Cardizem  drip to be reordered and also that the constant changing of medications was prevent the patient from maintaining a controlled rate. After advocating for a consistent plan of care and concern for multiple changes in less than an hour not benefiting the patient, cardizem  drip reordered. MD Zhang ok for 15mg /hr. After titration and time, pt HR 90's. EKG obtained and MD Zhang notified of the same.

## 2023-12-24 NOTE — ED Triage Notes (Signed)
 Pt is here for chest pain with some sob and generalized weakness which began around 8pm last night.  Pain is in central chest and feels like dull

## 2023-12-24 NOTE — ED Notes (Signed)
 MD Mian at the bedside.

## 2023-12-24 NOTE — ED Notes (Signed)
 BP 75/59. Cardizem  paused. Pt HOB lowered.

## 2023-12-24 NOTE — ED Provider Notes (Signed)
.  Critical Care  Performed by: Clarine Ozell LABOR, MD Authorized by: Clarine Ozell LABOR, MD   Critical care provider statement:    Critical care time (minutes):  30   Critical care time was exclusive of:  Separately billable procedures and treating other patients   Critical care was necessary to treat or prevent imminent or life-threatening deterioration of the following conditions:  Circulatory failure   Critical care was time spent personally by me on the following activities:  Development of treatment plan with patient or surrogate, discussions with consultants, evaluation of patient's response to treatment, examination of patient, ordering and review of laboratory studies, ordering and review of radiographic studies, ordering and performing treatments and interventions, pulse oximetry, re-evaluation of patient's condition and review of old charts   Care discussed with: admitting provider      I was called to bedside multiple times for patient becoming intermittently hypotensive and bradycardic.  Does appear to be in A-fib RVR though baseline tachycardia is only in the low 100s, do not suspect this is driving his hypotension at this time.  Given serial small boluses IV fluid (does not appear to be fluid overloaded at this time though does have a history of heart failure) and case discussed with managing hospitalist.  Ultimately upgraded to ICU.   Clarine Ozell LABOR, MD 12/25/23 [phone]

## 2023-12-24 NOTE — H&P (Signed)
 History and Physical    [name] Tyler Cohen. FMW:[id number] DOB: [birth year] DOA: 12/24/2023  PCP: Vicci Duwaine SQUIBB, DO (Confirm with patient/family/NH records and if not entered, this has to be entered at [facility] point of entry) Patient coming from: Home  I have personally briefly reviewed patient's old medical records in [facility]  Chief Complaint: Palpitations, shortness of breath, chest pain  HPI: Tyler Cohen. is a 68 y.o. male with medical history significant of chronic HFrEF with LVEF 20-25%, PAF on Eliquis , hypothyroidism on methimazole , thyroid  nodule status post I-131 ablation, prostate cancer status post radiation therapy, presented with cough shortness of breath palpitations, and chest pain.  Symptoms started 3 to 4 days ago, when patient started develop URI-like symptoms, including runny nose sore throat and dry cough, last few days he has been using around-the-clock DayQuil and NyQuil and albuterol  for his breathing symptoms with some relief.  However this morning he started develop strong feeling of palpitations and chest pains pressure-like 06-06-08 centrally located persistent and decided to come to the hospital.  ED Course: Afebrile, tachycardia A-fib heart rate 140-150 blood pressure 103/79 O2 saturation 100% room air General, chest x-ray negative for acute infiltrates, blood work showed TSH 0.1 T42.3 BUN 11 creatinine 1.1 hemoglobin 13.9 WBC 5.7.  Patient was given Cardizem  bolus x 1 and started on Cardizem  drip.  Review of Systems: As per HPI otherwise 14 point review of systems negative.    Past Medical History:  Diagnosis Date   Adrenal mass (HCC) 2019   Aortic atherosclerosis (HCC)    Arthritis    Atrial fibrillation (HCC)    a.) CHA2DS2VASc = 4 (age, CHF, HTN, vascular disease history);  b.) s/p DCCV (300 J x 1) 05/18/2021; c.) rate/rhythm maintained on oral digoxin ; chronically anticoagulated with apixaban    Benign neoplasm of sigmoid colon    BPH (benign  prostatic hyperplasia)    Dental disease    Digital mucinous cyst of finger of right hand 2018   Elevated PSA    Heart murmur    Hepatic cyst    Hepatic hemangioma    HFrEF (heart failure with reduced ejection fraction) (HCC)    a.) TTE 05/14/2021: EF 25-30%, glob HK, mod dil LV, RVE, PASP 39.8; b.) TTE 06/25/2021: EF 30%, LAE, triv AR/PR, mild TR, mod MR, G1DD   HLD (hyperlipidemia)    HTN (hypertension)    Hyperthyroidism 2019   Left thyroid  nodule 2019   Liver lesion 2018   Long term current use of antiarrhythmic drug    a.) digoxin    Macrocytosis    Malnutrition of moderate degree (HCC) 05/15/2021   Marijuana use    Multinodular goiter    NICM (nonischemic cardiomyopathy) (HCC)    a.) TTE 05/14/2021: EF 25-30%; b.) TTE 06/25/2021: EF 30%   Non compliance w medication regimen    On apixaban  therapy    Osteomyelitis of great toe (HCC) 05/03/2023   Peripheral vascular disease (HCC)    Psoriasis 2017   Renal mass 2018   Right lower lobe pulmonary nodule 08/20/2021   a.) cCTA 08/20/2021: measured 7 x 5 mm.   Thyroid  crisis or storm 05/13/2021   Tobacco abuse     Past Surgical History:  Procedure Laterality Date   AMPUTATION TOE Left 05/17/2023   Procedure: AMPUTATION TOE METATARSOPHALANGEAL JOINT;  Surgeon: Neill Boas, DPM;  Location: [facility];  Service: Orthopedics/Podiatry;  Laterality: Left;   COLONOSCOPY WITH PROPOFOL  N/A 12/01/2015   Procedure: COLONOSCOPY WITH PROPOFOL ;  Surgeon: Rogelia Copping, MD;  Location: [facility];  Service: Endoscopy;  Laterality: N/A;   POLYPECTOMY  12/01/2015   Procedure: POLYPECTOMY;  Surgeon: Rogelia Copping, MD;  Location: [facility];  Service: Endoscopy;;   PROSTATE BIOPSY N/A 05/25/2022   Procedure: PROSTATE BIOPSY;  Surgeon: Twylla Glendia BROCKS, MD;  Location: [facility];  Service: Urology;  Laterality: N/A;   TEE WITH CARDIOVERSION N/A 05/18/2021   TRANSRECTAL ULTRASOUND N/A 05/25/2022   Procedure: TRANSRECTAL ULTRASOUND;   Surgeon: Twylla Glendia BROCKS, MD;  Location: [facility];  Service: Urology;  Laterality: N/A;     reports that he quit smoking about 2 years ago. His smoking use included cigars and cigarettes. He started smoking about 44 years ago. He has a 10.5 pack-year smoking history. He has never used smokeless tobacco. He reports current drug use. Drug: Marijuana. He reports that he does not drink alcohol.  Allergies  Allergen Reactions   Lactose Intolerance (Gi) Diarrhea    Family History  Problem Relation Age of Onset   Hypertension Mother    Diabetes Mother        lost both legs   Kidney disease Mother    Hypertension Father    Emphysema Father    Sickle cell trait Father    Hypertension Brother    Hyperlipidemia Brother    Sickle cell trait Sister      Prior to Admission medications   Medication Sig Start Date End Date Taking? Authorizing Provider  methimazole  (TAPAZOLE ) 5 MG tablet Take 5 mg by mouth See admin instructions. Take with 10 mg for a total of 15 mg daily   Yes [provider]  albuterol  (VENTOLIN  HFA) 108 (90 Base) MCG/ACT inhaler INHALE 2 PUFFS INTO THE LUNGS EVERY 6 HOURS AS NEEDED FOR WHEEZING OR SHORTNESS OF BREATH 11/04/23   Johnson, Megan P, DO  Amoxicillin-Pot Clavulanate (AUGMENTIN PO) Take 1 tablet by mouth daily at 6 (six) AM.    [provider]  apixaban  (ELIQUIS ) 5 MG TABS tablet TAKE 1 TABLET BY MOUTH TWICE DAILY 01/28/23   Johnson, Megan P, DO  atorvastatin  (LIPITOR) 20 MG tablet TAKE 1 TABLET BY MOUTH AT BEDTIME 06/20/23   Johnson, Megan P, DO  digoxin  (LANOXIN ) 0.125 MG tablet Take 1 tablet (125 mcg total) by mouth daily. 06/20/23   Johnson, Megan P, DO  empagliflozin  (JARDIANCE ) 10 MG TABS tablet Take 1 tablet (10 mg total) by mouth daily. 06/20/23   Johnson, Megan P, DO  furosemide  (LASIX ) 40 MG tablet Take 1 tablet (40 mg total) by mouth daily. 06/20/23   Johnson, Megan P, DO  losartan  (COZAAR ) 25 MG tablet Take 1 tablet (25 mg total) by mouth  daily. 06/20/23   Johnson, Megan P, DO  methimazole  (TAPAZOLE ) 10 MG tablet Take 1 tablet (10 mg total) by mouth See admin instructions. Take with 5 mg for a total of 15 mg daily 06/20/23   Vicci Bouchard P, DO  spironolactone  (ALDACTONE ) 25 MG tablet Take 0.5 tablets (12.5 mg total) by mouth daily. 06/20/23   Johnson, Megan P, DO  tamsulosin  (FLOMAX ) 0.4 MG CAPS capsule Take 1 capsule (0.4 mg total) by mouth daily. 06/20/23   Vicci Bouchard SQUIBB, DO    Physical Exam: Vitals:   12/24/23 1230 12/24/23 1245 12/24/23 1318 12/24/23 1330  BP: 115/86 101/74 127/75 104/73  Pulse: (!) 56 (!) 35 (!) 120 (!) 57  Resp: 15 (!) 22  (!) 22  Temp:      TempSrc:  SpO2: 99% 97%  100%  Weight:      Height:        Constitutional: NAD, calm, comfortable Vitals:   12/24/23 1230 12/24/23 1245 12/24/23 1318 12/24/23 1330  BP: 115/86 101/74 127/75 104/73  Pulse: (!) 56 (!) 35 (!) 120 (!) 57  Resp: 15 (!) 22  (!) 22  Temp:      TempSrc:      SpO2: 99% 97%  100%  Weight:      Height:       Eyes: PERRL, lids and conjunctivae normal ENMT: Mucous membranes are moist. Posterior pharynx clear of any exudate or lesions.Normal dentition.  Neck: normal, supple, no masses, no thyromegaly Respiratory: clear to auscultation bilaterally, no wheezing, no crackles. Normal respiratory effort. No accessory muscle use.  Cardiovascular: Tachycardia, no murmurs / rubs / gallops. No extremity edema. 2+ pedal pulses. No carotid bruits.  Abdomen: no tenderness, no masses palpated. No hepatosplenomegaly. Bowel sounds positive.  Musculoskeletal: no clubbing / cyanosis. No joint deformity upper and lower extremities. Good ROM, no contractures. Normal muscle tone.  Skin: no rashes, lesions, ulcers. No induration Neurologic: CN 2-12 grossly intact. Sensation intact, DTR normal. Strength 5/5 in all 4.  Psychiatric: Normal judgment and insight. Alert and oriented x 3. Normal mood.    Labs on Admission: I have personally reviewed  following labs and imaging studies  CBC: Recent Labs  Lab 12/24/23 1111  WBC 5.7  HGB 13.9  HCT 41.7  MCV 95.6  PLT 150   Basic Metabolic Panel: Recent Labs  Lab 12/24/23 1111  NA 139  K 3.7  CL 104  CO2 23  GLUCOSE 127*  BUN 11  CREATININE 1.12  CALCIUM  8.8*  MG 1.8   GFR: Estimated Creatinine Clearance: 66.8 mL/min (by C-G formula based on SCr of 1.12 mg/dL). Liver Function Tests: No results for input(s): AST, ALT, ALKPHOS, BILITOT, PROT, ALBUMIN in the last 168 hours. No results for input(s): LIPASE, AMYLASE in the last 168 hours. No results for input(s): AMMONIA in the last 168 hours. Coagulation Profile: No results for input(s): INR, PROTIME in the last 168 hours. Cardiac Enzymes: No results for input(s): CKTOTAL, CKMB, CKMBINDEX, TROPONINI in the last 168 hours. BNP (last 3 results) No results for input(s): PROBNP in the last 8760 hours. HbA1C: No results for input(s): HGBA1C in the last 72 hours. CBG: No results for input(s): GLUCAP in the last 168 hours. Lipid Profile: No results for input(s): CHOL, HDL, LDLCALC, TRIG, CHOLHDL, LDLDIRECT in the last 72 hours. Thyroid  Function Tests: Recent Labs    12/24/23 1111  TSH <0.100*  FREET4 2.32*   Anemia Panel: No results for input(s): VITAMINB12, FOLATE, FERRITIN, TIBC, IRON, RETICCTPCT in the last 72 hours. Urine analysis:    Component Value Date/Time   COLORURINE YELLOW (A) 09/10/2022 0953   APPEARANCEUR Clear 06/20/2023 0839   LABSPEC 1.022 09/10/2022 0953   PHURINE 6.0 09/10/2022 0953   GLUCOSEU Negative 06/20/2023 0839   HGBUR NEGATIVE 09/10/2022 0953   BILIRUBINUR Negative 06/20/2023 0839   KETONESUR NEGATIVE 09/10/2022 0953   PROTEINUR Negative 06/20/2023 0839   PROTEINUR >=300 (A) 09/10/2022 0953   NITRITE Negative 06/20/2023 0839   NITRITE NEGATIVE 09/10/2022 0953   LEUKOCYTESUR 1+ (A) 06/20/2023 0839   LEUKOCYTESUR MODERATE (A)  09/10/2022 0953    Radiological Exams on Admission: DG Chest 2 View Result Date: 12/24/2023 CLINICAL DATA:  Chest pain and shortness of breath with generalized weakness 2 days. EXAM: CHEST - 2 VIEW COMPARISON:  06/16/2022, 05/13/2021 FINDINGS: Lungs are somewhat hyperexpanded with mild flattening of the hemidiaphragms on the lateral film. There is no focal airspace consolidation or effusion. Stable right apical scarring. Cardiomediastinal silhouette and remainder of the exam is unchanged. IMPRESSION: 1. No acute cardiopulmonary disease. 2. COPD. Electronically Signed   By: Toribio Agreste M.D.   On: 12/24/2023 11:54    EKG: Independently reviewed.  A-fib with RVR secondary ST-T changes due to tachycardia  Assessment/Plan Principal Problem:   Afib (HCC) Active Problems:   Atrial fibrillation (HCC)  (please populate well all problems here in Problem List. (For example, if patient is on BP meds at home and you resume or decide to hold them, it is a problem that needs to be her. Same for CAD, COPD, HLD and so on)  A-fib with RVR - Likely secondary to iatrogenic effect of pseudoephedrine containing cough medication as well as albuterol  for his URI/breathing symptoms - Tried both Cardizem  and beta-blocker in the ED, beta-blocker appears not touching his heart rate.  Continue Cardizem  drip, monitoring his volume status as patient has a history of low LVEF CHF.  Amiodarone  not considered given history of hyperthyroidism. -ED physician discussed case with cardiology, and decided Cardizem  as patient does not have signs of thyroid  storm.  Monitor volume status as patient has a history of low LVEF CHF. - Continue Eliquis  - Continue p.o. digoxin , check digoxin  level.  Chest pain - Likely demand ischemia secondary to rapid A-fib -BP too low to give nitroglycerin , will reevaluate after heart rate more controlled -Troponin negative x 2, ACS unlikely - Check echocardiogram - Likely can be followed  outpatient by cardiology for outpatient stress test.  History of hyperthyroidism - Patient and his wife reported that patient has been compliant with methimazole  - Thyroid  studies today showed similar T4 and TSH reading as baseline outpatient thyroid  lab has been showing for the last 2 years, thyroid  storm ruled out. - Continue methimazole  15 mg daily  Chronic HFrEF - Euvolemic - Blood pressure borderline low, hold off diuresis, hold off ARB and spironolactone   COPD - No symptoms or signs of acute exacerbation - Change albuterol  to Atrovent  for breathing treatment - Add ICS  Total time spent on patient care 75 minutes.  DVT prophylaxis: Eliquis  Code Status: Full code Family Communication: Wife at bedside Disposition Plan: Expect less than 2 midnight hospital stay Consults called: Curbside consult with cardiology Admission status: PCU observation   Cort ONEIDA Mana MD [facility] Pager [phone]  12/24/2023, 1:48 PM

## 2023-12-24 NOTE — ED Notes (Signed)
 VO NS MD Zhang now.

## 2023-12-24 NOTE — ED Notes (Signed)
 MD Zhang notified of the patients hypotension maintaining around 75/57 and asked to contact this nurse; phone number provided. Awaiting call and orders that may follow.

## 2023-12-24 NOTE — ED Notes (Signed)
 CCMD notified of cardiac monitoring order.

## 2023-12-24 NOTE — ED Notes (Signed)
 Patient exp periods of vomiting, diaphoretic, and restless. 250ml bolus requested for b/p 70's systolic and denied by MD Laurita. MD Clarine, ER physician called to room to assess patient.

## 2023-12-24 NOTE — Progress Notes (Addendum)
 HR was uncontrolled earlier with IV Lopressor  and p.o. propranolol  x 1 and Cardizem  drip was started again this afternoon.  However later patient BP significantly decreased, Cardizem  drip has to be stopped again and patient was given 250 mL of IV bolus.  Case was discussed with ICU attending and on-call cardiology, both recommended discontinue Cardizem  drip and start digoxin  loading plus as needed Lopressor  regimen.  Cardiology and intensivist further recommended Lopressor  5 mg every 15 minutes x 3 for quick control of the A-fib.  Amiodarone  not considered due to strong history of hyperthyroidism.  Nurse reported heart rate now in the 80-90, still in A-fib and blood pressure 88/63, will hold off the Lopressor  loading now.  Escalate patient to stepdown unit.

## 2023-12-24 NOTE — ED Provider Notes (Signed)
 [facility] Provider Note    Event Date/Time   First MD Initiated Contact with Patient 12/24/23 1115     (approximate)   History   Chest Pain   HPI  Tyler Cohen. is a 68 y.o. male  with pafib, CHF, PVD, HTN, HLD, multinodular goiter osteomyleitis of left foot, ongoing smoking and THC use who presents from home with 3 days of worsening palpitations and shortness of breath.  Patient states that he developed a cold earlier this week and stopped taking many of his cardiac medications.  Reports that he has been intermittently compliant with his methimazole  including taking a dose yesterday.  Reports palpitations began 72 hours ago.  There is associated shortness of breath but denies any chest pain or syncopal episodes.  No SI HI or AVH S     Physical Exam   Triage Vital Signs: ED Triage Vitals  Encounter Vitals Group     BP 12/24/23 1107 117/76     Girls Systolic BP Percentile --      Girls Diastolic BP Percentile --      Boys Systolic BP Percentile --      Boys Diastolic BP Percentile --      Pulse Rate 12/24/23 1107 (!) 146     Resp 12/24/23 1107 16     Temp 12/24/23 1107 98.4 F (36.9 C)     Temp Source 12/24/23 1107 Oral     SpO2 12/24/23 1107 96 %     Weight 12/24/23 1108 165 lb (74.8 kg)     Height --      Head Circumference --      Peak Flow --      Pain Score 12/24/23 1108 2     Pain Loc --      Pain Education --      Exclude from Growth Chart --     Most recent vital signs: Vitals:   12/24/23 1230 12/24/23 1245  BP: 115/86 101/74  Pulse: (!) 56 (!) 35  Resp: 15 (!) 22  Temp:    SpO2: 99% 97%    Nursing Triage Note reviewed. Vital signs reviewed and patients oxygen saturation is normoxic  General: Patient is well nourished, well developed, awake and alert, Head: Normocephalic and atraumatic Eyes: Normal inspection, extraocular muscles intact, no conjunctival pallor Ear, nose, throat: Normal external exam Neck: Normal  range of motion Respiratory: Patient is in no respiratory distress, lungs CTAB Cardiovascular: Patient is tachycardic, RR GI: Abd SNT with no guarding or rebound  Back: Normal inspection of the back with good strength and range of motion throughout all ext Extremities: pulses intact with good cap refills, no LE pitting edema or calf tenderness Neuro: The patient is alert and oriented to person, place, and time, appropriately conversive, with 5/5 bilat UE/LE strength, no gross motor or sensory defects noted. Coordination appears to be adequate. Skin: Warm, dry, and intact Psych: normal mood and affect, no SI or HI  ED Results / Procedures / Treatments   Labs (all labs ordered are listed, but only abnormal results are displayed) Labs Reviewed  BASIC METABOLIC PANEL WITH GFR - Abnormal; Notable for the following components:      Result Value   Glucose, Bld 127 (*)    Calcium  8.8 (*)    All other components within normal limits  TSH - Abnormal; Notable for the following components:   TSH <0.100 (*)    All other components within normal limits  T4, FREE - Abnormal; Notable for the following components:   Free T4 2.32 (*)    All other components within normal limits  BRAIN NATRIURETIC PEPTIDE - Abnormal; Notable for the following components:   B Natriuretic Peptide 818.6 (*)    All other components within normal limits  URINE DRUG SCREEN, QUALITATIVE ([facility] ONLY) - Abnormal; Notable for the following components:   Cannabinoid 50 Ng, Ur [place] POSITIVE (*)    All other components within normal limits  TROPONIN I (HIGH SENSITIVITY) - Abnormal; Notable for the following components:   Troponin I (High Sensitivity) 20 (*)    All other components within normal limits  CBC  MAGNESIUM   D-DIMER, QUANTITATIVE  T3, FREE  TROPONIN I (HIGH SENSITIVITY)     EKG EKG and rhythm strip are interpreted by myself:   EKG: A flutter at heart rate of 146, normal QRS duration, QTc 511, nonspecific ST  segments and T waves no ectopy EKG not consistent with Acute STEMI Rhythm strip: aflutter in lead II   RADIOLOGY CXR: No acute abnormality on my independent review interpretation radiologist agrees    PROCEDURES:  Critical Care performed: Yes, see critical care procedure note(s)  .Critical Care  Performed by: Nicholaus Rolland BRAVO, MD Authorized by: Nicholaus Rolland BRAVO, MD   Critical care provider statement:    Critical care time (minutes):  35   Critical care was necessary to treat or prevent imminent or life-threatening deterioration of the following conditions:  Cardiac failure   Critical care was time spent personally by me on the following activities:  Development of treatment plan with patient or surrogate, discussions with consultants, evaluation of patient's response to treatment, examination of patient, ordering and review of laboratory studies, ordering and review of radiographic studies, ordering and performing treatments and interventions, pulse oximetry, re-evaluation of patient's condition and review of old charts Comments:     A flutter with management of diltiazem  bolus and reassessment and diltiazem  drip    MEDICATIONS ORDERED IN ED: Medications  metoprolol  tartrate (LOPRESSOR ) injection 5 mg (has no administration in time range)  propranolol  (INDERAL ) tablet 10 mg (has no administration in time range)  diltiazem  (CARDIZEM ) 1 mg/mL load via infusion 15 mg (15 mg Intravenous Bolus from Bag 12/24/23 1227)     IMPRESSION / MDM / ASSESSMENT AND PLAN / ED COURSE                                Differential diagnosis includes, but is not limited to arrhythmia, CHF exacerbation, PE, ACS, electrolyte derangement, thyroid  storm   ED course: Patient presents with over 72 hours of palpitations in the setting of medication noncompliance.  EKG did demonstrate a flutter but patient's blood pressure is not hypotensive.  Given that he has not taken his Eliquis , his D-dimer was not  elevated.  Chest x-ray without evidence of pneumonia.  BNP is elevated.  He was given diltiazem  bolus and diltiazem  drip and case called into the hospitalist for admission   Clinical Course as of 12/24/23 1300  Sat Dec 24, 2023  1138 From oncall cardiologist with regarding dilt vs metoprolol : Either or would be fine but I have had better luck with BB in thyroid  storm (usually very high dose propranolol ) however if he is not in storm dilt is perfectly fine  [HD]  1209 B Natriuretic Peptide(!): 818.6 Elevated [HD]  1209 Troponin I (High Sensitivity)(!): 20 Elevated [HD]  1209  I am awaiting TSH and free T4 to determine rate controlling agent [HD]  1217 T4,Free(Direct)(!): 2.32 T4 seems less than prior values.  I do not think this is consistent with thyroid  storm.  Will initiate diltiazem  load and drip and call the patient into the hospitalist [HD]  1231 T4,Free(Direct)(!): 2.32 [HD]  1231 Case discussed with hospitalist for admission [HD]  1235 Case discussed with hospitalist for admission [HD]    Clinical Course User Index [HD] Nicholaus Rolland BRAVO, MD   -- Risk: 5 This patient has a high risk of morbidity due to further diagnostic testing or treatment. Rationale: This patient's evaluation and management involve a high risk of morbidity due to the potential severity of presenting symptoms, need for diagnostic testing, and/or initiation of treatment that may require close monitoring. The differential includes conditions with potential for significant deterioration or requiring escalation of care. Treatment decisions in the ED, including medication administration, procedural interventions, or disposition planning, reflect this level of risk. COPA: 5 The patient has the following acute or chronic illness/injury that poses a possible threat to life or bodily function: [X] : The patient has a potentially serious acute condition or an acute exacerbation of a chronic illness requiring urgent evaluation  and management in the Emergency Department. The clinical presentation necessitates immediate consideration of life-threatening or function-threatening diagnoses, even if they are ultimately ruled out.   FINAL CLINICAL IMPRESSION(S) / ED DIAGNOSES   Final diagnoses:  Atrial flutter with rapid ventricular response (HCC)  Palpitations  Non compliance w medication regimen     Rx / DC Orders   ED Discharge Orders     None        Note:  This document was prepared using Dragon voice recognition software and may include unintentional dictation errors.   Nicholaus Rolland BRAVO, MD 12/24/23 1300

## 2023-12-24 NOTE — Progress Notes (Addendum)
 1615 Admitted to room from ED per stretcher. Alert but will not answer questions .States I don't know very impatient. Gets mad easily with nurse and sister. States he does not smoke but positive for marijuani but toxi. screen is + for cannibus. Admits to taking medicine  When I wants to. Patient was sweating profusely. Yelling that his right chest hurt. Had to be ask several times to calm down. Given Tylenol  with immediate relief even though it was PO. 1730 Noted to have a run of SVT 150 and A.Fib in the 100s. Patient converted back to SR spontaneously

## 2023-12-24 NOTE — ED Notes (Signed)
 NT attempted to ambulate Pt to restroom. Upon standing, patient became dizzy and vomited. Patient placed back in bed. Mentation unchanged or effected. BP after laying down 98/62. HR >135. Pt Hr decreased to 90's after a couple minutes. MD Laurita came to the bedside at this time and informed of the same.

## 2023-12-25 ENCOUNTER — Other Ambulatory Visit: Payer: Self-pay

## 2023-12-25 ENCOUNTER — Inpatient Hospital Stay

## 2023-12-25 ENCOUNTER — Inpatient Hospital Stay (HOSPITAL_COMMUNITY)
Admit: 2023-12-25 | Discharge: 2023-12-25 | Disposition: A | Discharge status: Home / Self Care | Attending: Internal Medicine | Admitting: Internal Medicine

## 2023-12-25 ENCOUNTER — Encounter
Admission: EM | Disposition: A | Discharge status: Other Acute Inpatient Hospital | Payer: Self-pay | Source: Home / Self Care | Attending: Internal Medicine

## 2023-12-25 ENCOUNTER — Encounter: Payer: Self-pay | Admitting: Internal Medicine

## 2023-12-25 ENCOUNTER — Encounter (HOSPITAL_COMMUNITY): Payer: Self-pay

## 2023-12-25 DIAGNOSIS — I429 Cardiomyopathy, unspecified: Secondary | ICD-10-CM | POA: Diagnosis not present

## 2023-12-25 DIAGNOSIS — I4891 Unspecified atrial fibrillation: Secondary | ICD-10-CM

## 2023-12-25 DIAGNOSIS — E0590 Thyrotoxicosis, unspecified without thyrotoxic crisis or storm: Secondary | ICD-10-CM | POA: Diagnosis not present

## 2023-12-25 DIAGNOSIS — I5023 Acute on chronic systolic (congestive) heart failure: Secondary | ICD-10-CM | POA: Diagnosis not present

## 2023-12-25 DIAGNOSIS — I5020 Unspecified systolic (congestive) heart failure: Secondary | ICD-10-CM

## 2023-12-25 DIAGNOSIS — I4819 Other persistent atrial fibrillation: Secondary | ICD-10-CM | POA: Diagnosis not present

## 2023-12-25 DIAGNOSIS — R079 Chest pain, unspecified: Secondary | ICD-10-CM

## 2023-12-25 DIAGNOSIS — R579 Shock, unspecified: Secondary | ICD-10-CM | POA: Diagnosis not present

## 2023-12-25 DIAGNOSIS — I340 Nonrheumatic mitral (valve) insufficiency: Secondary | ICD-10-CM | POA: Diagnosis not present

## 2023-12-25 DIAGNOSIS — R9431 Abnormal electrocardiogram [ECG] [EKG]: Secondary | ICD-10-CM

## 2023-12-25 HISTORY — PX: RIGHT/LEFT HEART CATH AND CORONARY ANGIOGRAPHY: CATH118266

## 2023-12-25 HISTORY — PX: ARTERIAL LINE INSERTION: CATH118227

## 2023-12-25 LAB — POCT I-STAT EG7
Acid-Base Excess: 4 mmol/L — ABNORMAL HIGH (ref 0.0–2.0)
Acid-Base Excess: 5 mmol/L — ABNORMAL HIGH (ref 0.0–2.0)
Acid-Base Excess: 6 mmol/L — ABNORMAL HIGH (ref 0.0–2.0)
Bicarbonate: 30.9 mmol/L — ABNORMAL HIGH (ref 20.0–28.0)
Bicarbonate: 32.4 mmol/L — ABNORMAL HIGH (ref 20.0–28.0)
Bicarbonate: 32.8 mmol/L — ABNORMAL HIGH (ref 20.0–28.0)
Calcium, Ion: 1.04 mmol/L — ABNORMAL LOW (ref 1.15–1.40)
Calcium, Ion: 1.1 mmol/L — ABNORMAL LOW (ref 1.15–1.40)
Calcium, Ion: 1.12 mmol/L — ABNORMAL LOW (ref 1.15–1.40)
HCT: 38 % — ABNORMAL LOW (ref 39.0–52.0)
HCT: 39 % (ref 39.0–52.0)
HCT: 39 % (ref 39.0–52.0)
Hemoglobin: 12.9 g/dL — ABNORMAL LOW (ref 13.0–17.0)
Hemoglobin: 13.3 g/dL (ref 13.0–17.0)
Hemoglobin: 13.3 g/dL (ref 13.0–17.0)
O2 Saturation: 54 %
O2 Saturation: 68 %
O2 Saturation: 70 %
Potassium: 4.5 mmol/L (ref 3.5–5.1)
Potassium: 4.5 mmol/L (ref 3.5–5.1)
Potassium: 4.6 mmol/L (ref 3.5–5.1)
Sodium: 139 mmol/L (ref 135–145)
Sodium: 140 mmol/L (ref 135–145)
Sodium: 142 mmol/L (ref 135–145)
TCO2: 33 mmol/L — ABNORMAL HIGH (ref 22–32)
TCO2: 34 mmol/L — ABNORMAL HIGH (ref 22–32)
TCO2: 35 mmol/L — ABNORMAL HIGH (ref 22–32)
pCO2, Ven: 54.2 mmHg (ref 44–60)
pCO2, Ven: 55.2 mmHg (ref 44–60)
pCO2, Ven: 60.5 mmHg — ABNORMAL HIGH (ref 44–60)
pH, Ven: 7.342 (ref 7.25–7.43)
pH, Ven: 7.365 (ref 7.25–7.43)
pH, Ven: 7.376 (ref 7.25–7.43)
pO2, Ven: 31 mmHg — CL (ref 32–45)
pO2, Ven: 38 mmHg (ref 32–45)
pO2, Ven: 38 mmHg (ref 32–45)

## 2023-12-25 LAB — LACTIC ACID, PLASMA: Lactic Acid, Venous: 2.7 mmol/L (ref 0.5–1.9)

## 2023-12-25 LAB — ECHOCARDIOGRAM COMPLETE
AR max vel: 2.47 cm2
AV Area VTI: 2.27 cm2
AV Area mean vel: 2.34 cm2
AV Mean grad: 3 mmHg
AV Peak grad: 5 mmHg
Ao pk vel: 1.12 m/s
Height: 73 in
MV M vel: 5.58 m/s
MV Peak grad: 124.5 mmHg
Radius: 1.1 cm
S' Lateral: 5.4 cm
Weight: 2640 [oz_av]

## 2023-12-25 LAB — BASIC METABOLIC PANEL WITH GFR
Anion gap: 7 (ref 5–15)
BUN: 19 mg/dL (ref 8–23)
CO2: 23 mmol/L (ref 22–32)
Calcium: 8.4 mg/dL — ABNORMAL LOW (ref 8.9–10.3)
Chloride: 107 mmol/L (ref 98–111)
Creatinine, Ser: 1.24 mg/dL (ref 0.61–1.24)
GFR, Estimated: >60 mL/min (ref >60)
Glucose, Bld: 116 mg/dL — ABNORMAL HIGH (ref 70–99)
Potassium: 3.9 mmol/L (ref 3.5–5.1)
Sodium: 137 mmol/L (ref 135–145)

## 2023-12-25 LAB — POCT I-STAT 7, (LYTES, BLD GAS, ICA,H+H)
Acid-Base Excess: 2 mmol/L (ref 0.0–2.0)
Acid-base deficit: 6 mmol/L — ABNORMAL HIGH (ref 0.0–2.0)
Bicarbonate: 19.7 mmol/L — ABNORMAL LOW (ref 20.0–28.0)
Bicarbonate: 27.5 mmol/L (ref 20.0–28.0)
Calcium, Ion: 1.19 mmol/L (ref 1.15–1.40)
Calcium, Ion: 1.12 mmol/L — ABNORMAL LOW (ref 1.15–1.40)
HCT: 42 % (ref 39.0–52.0)
HCT: 37 % — ABNORMAL LOW (ref 39.0–52.0)
Hemoglobin: 14.3 g/dL (ref 13.0–17.0)
Hemoglobin: 12.6 g/dL — ABNORMAL LOW (ref 13.0–17.0)
O2 Saturation: 96 %
O2 Saturation: 89 %
Potassium: 5.1 mmol/L (ref 3.5–5.1)
Potassium: 4.4 mmol/L (ref 3.5–5.1)
Sodium: 134 mmol/L — ABNORMAL LOW (ref 135–145)
Sodium: 138 mmol/L (ref 135–145)
TCO2: 21 mmol/L — ABNORMAL LOW (ref 22–32)
TCO2: 29 mmol/L (ref 22–32)
pCO2 arterial: 38.9 mmHg (ref 32–48)
pCO2 arterial: 46.2 mmHg (ref 32–48)
pH, Arterial: 7.313 — ABNORMAL LOW (ref 7.35–7.45)
pH, Arterial: 7.383 (ref 7.35–7.45)
pO2, Arterial: 93 mmHg (ref 83–108)
pO2, Arterial: 58 mmHg — ABNORMAL LOW (ref 83–108)

## 2023-12-25 LAB — GLUCOSE, CAPILLARY
Glucose-Capillary: 154 mg/dL — ABNORMAL HIGH (ref 70–99)
Glucose-Capillary: 134 mg/dL — ABNORMAL HIGH (ref 70–99)
Glucose-Capillary: 157 mg/dL — ABNORMAL HIGH (ref 70–99)

## 2023-12-25 LAB — TROPONIN I (HIGH SENSITIVITY): Troponin I (High Sensitivity): 13 ng/L (ref <18)

## 2023-12-25 LAB — HEMOGLOBIN A1C
Hgb A1c MFr Bld: 5.4 % (ref 4.8–5.6)
Mean Plasma Glucose: 108.28 mg/dL

## 2023-12-25 LAB — CG4 I-STAT (LACTIC ACID)
Lactic Acid, Venous: 1.6 mmol/L (ref 0.5–1.9)
Lactic Acid, Venous: 1.4 mmol/L (ref 0.5–1.9)

## 2023-12-25 LAB — DIGOXIN LEVEL: Digoxin Level: 3.3 ng/mL (ref 0.8–2.0)

## 2023-12-25 LAB — APTT: aPTT: 31 s (ref 24–36)

## 2023-12-25 LAB — MAGNESIUM: Magnesium: 1.9 mg/dL (ref 1.7–2.4)

## 2023-12-25 SURGERY — RIGHT/LEFT HEART CATH AND CORONARY ANGIOGRAPHY
Anesthesia: Moderate Sedation

## 2023-12-25 MED ORDER — SODIUM BICARBONATE 8.4 % IV SOLN
INTRAVENOUS | Status: AC
  Filled 2023-12-25: qty 50

## 2023-12-25 MED ORDER — AMIODARONE HCL IN DEXTROSE 360-4.14 MG/200ML-% IV SOLN: 30.0000 mg/h | INTRAVENOUS | Status: DC

## 2023-12-25 MED ORDER — AMIODARONE LOAD VIA INFUSION
150.0000 mg | Freq: Once | INTRAVENOUS | Status: DC
  Filled 2023-12-25: qty 83.34

## 2023-12-25 MED ORDER — FENTANYL CITRATE (PF) 100 MCG/2ML IJ SOLN
INTRAMUSCULAR | Status: AC
  Filled 2023-12-25: qty 2

## 2023-12-25 MED ORDER — POTASSIUM CHLORIDE CRYS ER 20 MEQ PO TBCR
40.0000 meq | EXTENDED_RELEASE_TABLET | Freq: Once | ORAL | Status: AC
  Administered 2023-12-25: 40 meq via ORAL
  Filled 2023-12-25: qty 2

## 2023-12-25 MED ORDER — SODIUM CHLORIDE 0.9% FLUSH: 3.0000 mL | INTRAVENOUS | Status: DC | PRN

## 2023-12-25 MED ORDER — AMIODARONE LOAD VIA INFUSION
150.0000 mg | Freq: Once | INTRAVENOUS | Status: AC
  Administered 2023-12-25: 150 mg via INTRAVENOUS
  Filled 2023-12-25: qty 83.34

## 2023-12-25 MED ORDER — HEPARIN (PORCINE) IN NACL 2000-0.9 UNIT/L-% IV SOLN
INTRAVENOUS | Status: DC | PRN
  Administered 2023-12-25: 1000 mL

## 2023-12-25 MED ORDER — NITROGLYCERIN 0.4 MG SL SUBL
SUBLINGUAL_TABLET | SUBLINGUAL | Status: AC
  Filled 2023-12-25: qty 1

## 2023-12-25 MED ORDER — AMIODARONE HCL IN DEXTROSE 360-4.14 MG/200ML-% IV SOLN
60.0000 mg/h | INTRAVENOUS | Status: DC
  Filled 2023-12-25: qty 200

## 2023-12-25 MED ORDER — HEPARIN (PORCINE) IN NACL 1000-0.9 UT/500ML-% IV SOLN
INTRAVENOUS | Status: AC
  Filled 2023-12-25: qty 1000

## 2023-12-25 MED ORDER — CHLORHEXIDINE GLUCONATE CLOTH 2 % EX PADS
6.0000 | MEDICATED_PAD | Freq: Every day | CUTANEOUS | Status: DC
  Administered 2023-12-25: 6 via TOPICAL

## 2023-12-25 MED ORDER — NOREPINEPHRINE BITARTRATE 1 MG/ML IV SOLN
INTRAVENOUS | Status: DC | PRN
  Administered 2023-12-25: 4 ug/min via INTRAVENOUS

## 2023-12-25 MED ORDER — HEPARIN (PORCINE) 25000 UT/250ML-% IV SOLN
INTRAVENOUS | Status: AC
  Filled 2023-12-25: qty 250

## 2023-12-25 MED ORDER — FENTANYL CITRATE (PF) 100 MCG/2ML IJ SOLN
INTRAMUSCULAR | Status: DC | PRN
  Administered 2023-12-25: 25 ug via INTRAVENOUS

## 2023-12-25 MED ORDER — MIDAZOLAM HCL 2 MG/2ML IJ SOLN
INTRAMUSCULAR | Status: AC
  Filled 2023-12-25: qty 2

## 2023-12-25 MED ORDER — NOREPINEPHRINE 4 MG/250ML-% IV SOLN
INTRAVENOUS | Status: AC
  Administered 2023-12-25: 2 ug/min via INTRAVENOUS
  Filled 2023-12-25: qty 250

## 2023-12-25 MED ORDER — APIXABAN 5 MG PO TABS: 5.0000 mg | ORAL_TABLET | Freq: Two times a day (BID) | ORAL | Status: DC

## 2023-12-25 MED ORDER — SODIUM BICARBONATE 8.4 % IV SOLN
INTRAVENOUS | Status: AC
  Filled 2023-12-25: qty 100

## 2023-12-25 MED ORDER — LIDOCAINE HCL (PF) 1 % IJ SOLN
INTRAMUSCULAR | Status: DC | PRN
  Administered 2023-12-25 (×2): 2 mL

## 2023-12-25 MED ORDER — LIDOCAINE HCL 1 % IJ SOLN
INTRAMUSCULAR | Status: AC
  Filled 2023-12-25: qty 20

## 2023-12-25 MED ORDER — NOREPINEPHRINE 4 MG/250ML-% IV SOLN
0.0000 ug/min | INTRAVENOUS | Status: DC
  Administered 2023-12-25: 4 ug/min via INTRAVENOUS
  Administered 2023-12-25: 6 ug/min via INTRAVENOUS
  Filled 2023-12-25: qty 250

## 2023-12-25 MED ORDER — DILTIAZEM HCL 25 MG/5ML IV SOLN
5.0000 mg | Freq: Once | INTRAVENOUS | Status: AC
  Administered 2023-12-25: 5 mg via INTRAVENOUS
  Filled 2023-12-25: qty 5

## 2023-12-25 MED ORDER — SODIUM BICARBONATE 8.4 % IV SOLN
INTRAVENOUS | Status: DC | PRN
  Administered 2023-12-25: 150 meq via INTRAVENOUS

## 2023-12-25 MED ORDER — SODIUM CHLORIDE 0.9 % IV BOLUS
250.0000 mL | Freq: Once | INTRAVENOUS | Status: AC
  Administered 2023-12-25: 250 mL via INTRAVENOUS

## 2023-12-25 MED ORDER — SOTALOL HCL 80 MG PO TABS
80.0000 mg | ORAL_TABLET | Freq: Two times a day (BID) | ORAL | Status: DC
  Administered 2023-12-25: 80 mg via ORAL
  Filled 2023-12-25 (×2): qty 1

## 2023-12-25 MED ORDER — MORPHINE SULFATE (PF) 2 MG/ML IV SOLN
2.0000 mg | INTRAVENOUS | Status: DC | PRN
  Administered 2023-12-25: 2 mg via INTRAVENOUS
  Filled 2023-12-25 (×2): qty 1

## 2023-12-25 MED ORDER — MAGNESIUM SULFATE 2 GM/50ML IV SOLN
2.0000 g | Freq: Once | INTRAVENOUS | Status: AC
  Administered 2023-12-25: 2 g via INTRAVENOUS
  Filled 2023-12-25: qty 50

## 2023-12-25 MED ORDER — VERAPAMIL HCL 2.5 MG/ML IV SOLN
INTRAVENOUS | Status: AC
  Filled 2023-12-25: qty 2

## 2023-12-25 MED ORDER — SODIUM CHLORIDE 0.9 % IV SOLN: 250.0000 mL | INTRAVENOUS | Status: DC | PRN

## 2023-12-25 MED ORDER — SOTALOL HCL 80 MG PO TABS: 80.0000 mg | ORAL_TABLET | Freq: Two times a day (BID) | ORAL | Status: DC

## 2023-12-25 MED ORDER — HEPARIN SODIUM (PORCINE) 1000 UNIT/ML IJ SOLN
INTRAMUSCULAR | Status: AC
  Filled 2023-12-25: qty 10

## 2023-12-25 MED ORDER — VERAPAMIL HCL 2.5 MG/ML IV SOLN
INTRAVENOUS | Status: DC | PRN
  Administered 2023-12-25: 2.5 mg via INTRAVENOUS

## 2023-12-25 MED ORDER — IOHEXOL 300 MG/ML  SOLN
INTRAMUSCULAR | Status: DC | PRN
  Administered 2023-12-25: 55 mL

## 2023-12-25 MED ORDER — MIDAZOLAM HCL 2 MG/2ML IJ SOLN
INTRAMUSCULAR | Status: DC | PRN
  Administered 2023-12-25: 1 mg via INTRAVENOUS

## 2023-12-25 MED ORDER — AMIODARONE HCL IN DEXTROSE 360-4.14 MG/200ML-% IV SOLN
60.0000 mg/h | INTRAVENOUS | Status: DC
  Administered 2023-12-25: 60 mg/h via INTRAVENOUS
  Filled 2023-12-25: qty 200

## 2023-12-25 MED ORDER — METOPROLOL TARTRATE 5 MG/5ML IV SOLN
5.0000 mg | INTRAVENOUS | Status: DC | PRN
Start: 2023-12-25 — End: 2023-12-26
  Administered 2023-12-25 (×2): 5 mg via INTRAVENOUS
  Filled 2023-12-25 (×2): qty 5

## 2023-12-25 MED ORDER — METOPROLOL TARTRATE 5 MG/5ML IV SOLN
2.5000 mg | Freq: Once | INTRAVENOUS | Status: AC
  Administered 2023-12-25: 2.5 mg via INTRAVENOUS

## 2023-12-25 MED ORDER — MILRINONE LACTATE IN DEXTROSE 20-5 MG/100ML-% IV SOLN
0.2500 ug/kg/min | INTRAVENOUS | Status: DC
Start: 2023-12-25 — End: 2023-12-26
  Administered 2023-12-25: 0.25 ug/kg/min via INTRAVENOUS
  Filled 2023-12-25: qty 100

## 2023-12-25 MED ORDER — SODIUM CHLORIDE 0.9% FLUSH
3.0000 mL | Freq: Two times a day (BID) | INTRAVENOUS | Status: DC
  Administered 2023-12-25: 3 mL via INTRAVENOUS

## 2023-12-25 MED ORDER — NITROGLYCERIN 0.4 MG SL SUBL: 0.4000 mg | SUBLINGUAL_TABLET | SUBLINGUAL | Status: DC | PRN

## 2023-12-25 SURGICAL SUPPLY — 18 items
CANNULA 5F STIFF (CANNULA) IMPLANT
CATH 5FR JL3.5 JR4 ANG PIG MP (CATHETERS) IMPLANT
CATH INFINITI JR4 5F (CATHETERS) IMPLANT
CATH SWAN GANZ VIP 7.5F (CATHETERS) IMPLANT
DEVICE RAD TR BAND REGULAR (VASCULAR PRODUCTS) IMPLANT
DRAPE BRACHIAL (DRAPES) IMPLANT
GLIDESHEATH SLEND SS 6F .021 (SHEATH) IMPLANT
GUIDEWIRE INQWIRE 1.5J.035X260 (WIRE) IMPLANT
KIT ENCORE 26 ADVANTAGE (KITS) IMPLANT
KIT INTRODUCER PERCUTANE 8.5FR (SHEATH) IMPLANT
KIT MICROPUNCTURE VSI 5F STIFF (SHEATH) IMPLANT
PACK CARDIAC CATH (CUSTOM PROCEDURE TRAY) ×1 IMPLANT
SET ATX-X65L (MISCELLANEOUS) IMPLANT
SHEATH AVANTI 7FRX11 (SHEATH) IMPLANT
SHEATH BRITE TIP 8FRX11 (SHEATH) IMPLANT
STATION PROTECTION PRESSURIZED (MISCELLANEOUS) IMPLANT
SUT SILK 0 FSL (SUTURE) IMPLANT
TUBING CIL FLEX 10 FLL-RA (TUBING) IMPLANT

## 2023-12-25 NOTE — Plan of Care (Signed)
  Problem: Education: Goal: Knowledge of General Education information will improve Description: Including pain rating scale, medication(s)/side effects and non-pharmacologic comfort measures Outcome: Progressing   Problem: Clinical Measurements: Goal: Ability to maintain clinical measurements within normal limits will improve Outcome: Progressing Goal: Diagnostic test results will improve Outcome: Progressing   Problem: Activity: Goal: Risk for activity intolerance will decrease Outcome: Not Progressing

## 2023-12-25 NOTE — Consult Note (Incomplete)
 NAME:  Tyler Cohen., MRN:  [id number], DOB:  08-21-1955, LOS: 1 ADMISSION DATE:  12/24/2023, CONSULTATION DATE:  12/25/2023 REFERRING MD:  Madison Peaches REASON FOR CONSULT:  Cardiogenic shock   BRIEF HOSPITAL COURSE  68 year old male with PMHx significant of chronic HFrEF with LVEF 20-25%, PAF on Eliquis , hypothyroidism on methimazole , thyroid  nodule status post I-131 ablation, prostate cancer status post radiation therapy, who presented to the ED with cough shortness of breath, palpitations, and chest pain.     He was admitted to [facility] service with A fib with RVR, severe MR, severe cardiomyopathy EF less 20 to 25% and an enlarged left atrium. The patient was started on Cardizem  and beta-blockers in the ED. He received a few doses of digoxin  with a digoxin  level of 3.3; therefore, he was held. Patient remained in Afib with RVR with soft BP requiring Amiodarone  load after discussion with cardiology. Given thyroid  issues and inability to continue Amiodarone , he was started on oral sotalol  and continued on Eliquis  and PRN beta-blockers. Cardiology Dr. Custovic attempted to get the patient transferred to [facility] for MVR, but there were no beds. Overnight, he complained of left-sided chest 10/10; he was profusely diaphoretic and actively vomiting. A STAT EKG was obtained, which showed no evidence of STEMI. Due to concern for cardiogenic shock, STEMI was activated for consideration of MCS. He was taken to the Cath lab emergently for [facility] +/- MCS with plans to transfer to [facility] post procedure.  Past Medical History   Adrenal mass (HCC) 2019   Aortic atherosclerosis (HCC)    Arthritis    Atrial fibrillation (HCC)    a.) CHA2DS2VASc = 4 (age, CHF, HTN, vascular disease history);  b.) s/p DCCV (300 J x 1) 05/18/2021; c.) rate/rhythm maintained on oral digoxin ; chronically anticoagulated with apixaban   Benign neoplasm of sigmoid colon    BPH (benign prostatic hyperplasia)    Dental disease    Digital  mucinous cyst of finger of right hand 2018  Elevated PSA    Heart murmur    Hepatic cyst    Hepatic hemangioma    HFrEF (heart failure with reduced ejection fraction) (HCC)    a.) TTE 05/14/2021: EF 25-30%, glob HK, mod dil LV, RVE, PASP 39.8; b.) TTE 06/25/2021: EF 30%, LAE, triv AR/PR, mild TR, mod MR, G1DD  HLD (hyperlipidemia)    HTN (hypertension)    Hyperthyroidism 2019  Left thyroid  nodule 2019  Liver lesion 2018  Long term current use of antiarrhythmic drug    a.) digoxin   Macrocytosis    Malnutrition of moderate degree (HCC) 05/15/2021  Marijuana use    Multinodular goiter    NICM (nonischemic cardiomyopathy) (HCC)    a.) TTE 05/14/2021: EF 25-30%; b.) TTE 06/25/2021: EF 30%  Non compliance w medication regimen    On apixaban  therapy    Osteomyelitis of great toe (HCC) 05/03/2023  Peripheral vascular disease (HCC)    Psoriasis 2017  Renal mass 2018  Right lower lobe pulmonary nodule 08/20/2021  a.) cCTA 08/20/2021: measured 7 x 5 mm.  Thyroid  crisis or storm 05/13/2021  Tobacco abuse          Significant Hospital Events   9/20:Admit to [facility] service with Afib RVR s/p DCCV 9/21:PCCM consulted for cardiogenic shock  Consults:  Cardiology  Procedures:  9/21: Left IJ  Interim History / Subjective:      Micro Data:  9/21: MRSA PCR>neg   Antimicrobials:  None  OBJECTIVE  Blood pressure ROLLEN)  89/67, pulse (!) 127, temperature 98.7 F (37.1 C), temperature source Oral, resp. rate 19, height 6' 1 (1.854 m), weight 74.8 kg, SpO2 98%.        Intake/Output Summary (Last 24 hours) at 12/25/2023 2141 Last data filed at 12/25/2023 1700 Gross per 24 hour  Intake 291.8 ml  Output 1075 ml  Net -783.2 ml   Filed Weights   12/24/23 1108 12/24/23 1136  Weight: 74.8 kg 74.8 kg    Physical Examination  GEN: Critically ill patient, WDWN in mild distress HEENT: [place]/AT. PERRL, sclerae anicteric. HEART: Irregular rhythm, normal rate, S1, S2, no M/R/G,  LUNGS: CTAB,  mild crackles without wheezes, no increased WOB,  EXTREMITIES: No Edema, cap refill  NEURO: No gross focal deficits. PSYCH:  Mood and Affect: Mood normal.  ABDOMINAL: Soft: BS x 4, NTND SKIN: Intact, warm, no rashes lesion, or ulcer  Labs/imaging that I havepersonally reviewed  (right click and Reselect all SmartList Selections daily)   DG Chest Port 1 View Result Date: 12/25/2023 CLINICAL DATA:  Central line placement. EXAM: PORTABLE CHEST 1 VIEW COMPARISON:  Chest x-ray 12/25/2023. FINDINGS: There is a new left-sided central venous catheter with distal tip in the SVC. There is no pneumothorax. There are patchy airspace opacities in the central right lung similar to prior. The heart is enlarged, unchanged. No pleural effusion. IMPRESSION: 1. New left-sided central venous catheter with distal tip in the SVC. No pneumothorax. 2. Patchy airspace opacities in the central right lung similar to prior. Electronically Signed   By: Greig Pique M.D.   On: 12/25/2023 21:30   ECHOCARDIOGRAM COMPLETE Result Date: 12/25/2023    ECHOCARDIOGRAM REPORT   Patient Name:   Tyler Cohen Date of Exam: 12/25/2023 Medical Rec #:  [id number]       Height:       73.0 in Accession #:    [id number]      Weight:       165.0 lb Date of Birth:  May 22, 1955       BSA:          1.983 m Patient Age:    68 years        BP:           107/80 mmHg Patient Gender: M               HR:           119 bpm. Exam Location:  [facility] Procedure: 2D Echo, 3D Echo, Cardiac Doppler, Color Doppler and Strain Analysis            (Both Spectral and Color Flow Doppler were utilized during            procedure). Indications:     R07.89 Other chest pain; R07.9* Chest pain, unspecified  History:         Patient has prior history of Echocardiogram examinations, most                  recent 05/14/2021. Signs/Symptoms:Chest Pain.  Sonographer:     Doyal Point MHA, BS, RDCS Referring Phys:  [id number] CORT ONEIDA MANA Diagnosing Phys: Annalee Custovic IMPRESSIONS  1.  Left ventricular ejection fraction, by estimation, is 20 to 25%. Left ventricular ejection fraction by PLAX is 21 %. The left ventricle has severely decreased function. The left ventricle demonstrates global hypokinesis. Left ventricular diastolic parameters are indeterminate.  2. Right ventricular systolic function is normal. The right ventricular size is normal.  3. Left atrial  size was severely dilated.  4. The mitral valve is abnormal. Severe mitral valve regurgitation. No evidence of mitral stenosis.  5. Tricuspid valve regurgitation is mild to moderate.  6. The aortic valve is normal in structure. Aortic valve regurgitation is trivial. No aortic stenosis is present.  7. The inferior vena cava is normal in size with greater than 50% respiratory variability, suggesting right atrial pressure of 3 mmHg. FINDINGS  Left Ventricle: Left ventricular ejection fraction, by estimation, is 20 to 25%. Left ventricular ejection fraction by PLAX is 21 %. The left ventricle has severely decreased function. The left ventricle demonstrates global hypokinesis. The left ventricular internal cavity size was normal in size. There is no left ventricular hypertrophy. Left ventricular diastolic parameters are indeterminate. Right Ventricle: The right ventricular size is normal. No increase in right ventricular wall thickness. Right ventricular systolic function is normal. Left Atrium: Left atrial size was severely dilated. Right Atrium: Right atrial size was normal in size. Pericardium: There is no evidence of pericardial effusion. Mitral Valve: The mitral valve is abnormal. Severe mitral valve regurgitation. No evidence of mitral valve stenosis. Tricuspid Valve: The tricuspid valve is normal in structure. Tricuspid valve regurgitation is mild to moderate. Aortic Valve: The aortic valve is normal in structure. Aortic valve regurgitation is trivial. No aortic stenosis is present. Aortic valve mean gradient measures 3.0 mmHg. Aortic  valve peak gradient measures 5.0 mmHg. Aortic valve area, by VTI measures 2.27 cm. Pulmonic Valve: The pulmonic valve was normal in structure. Pulmonic valve regurgitation is mild. Aorta: The aortic root is normal in size and structure. Venous: The inferior vena cava is normal in size with greater than 50% respiratory variability, suggesting right atrial pressure of 3 mmHg. IAS/Shunts: No atrial level shunt detected by color flow Doppler.  LEFT VENTRICLE PLAX 2D LV EF:         Left ventricular ejection fraction by PLAX is 21 %. LVIDd:         6.00 cm LVIDs:         5.40 cm LV PW:         1.10 cm LV IVS:        0.90 cm LVOT diam:     2.30 cm LV SV:         39 LV SV Index:   20 LVOT Area:     4.15 cm  RIGHT VENTRICLE RV Basal diam:  4.25 cm RV Mid diam:    3.40 cm RV S prime:     14.90 cm/s TAPSE (M-mode): 1.7 cm LEFT ATRIUM              Index        RIGHT ATRIUM           Index LA diam:        3.80 cm  1.92 cm/m   RA Area:     20.40 cm LA Vol (A2C):   163.0 ml 82.21 ml/m  RA Volume:   65.10 ml  32.83 ml/m LA Vol (A4C):   147.0 ml 74.14 ml/m LA Biplane Vol: 161.0 ml 81.20 ml/m  AORTIC VALVE AV Area (Vmax):    2.47 cm AV Area (Vmean):   2.34 cm AV Area (VTI):     2.27 cm AV Vmax:           112.00 cm/s AV Vmean:          79.100 cm/s AV VTI:  0.171 m AV Peak Grad:      5.0 mmHg AV Mean Grad:      3.0 mmHg LVOT Vmax:         66.60 cm/s LVOT Vmean:        44.500 cm/s LVOT VTI:          0.094 m LVOT/AV VTI ratio: 0.55  AORTA Ao Root diam: 3.90 cm MR Peak grad:    124.5 mmHg MR Mean grad:    83.0 mmHg    SHUNTS MR Vmax:         558.00 cm/s  Systemic VTI:  0.09 m MR Vmean:        431.0 cm/s   Systemic Diam: 2.30 cm MR PISA:         7.60 cm MR PISA Eff ROA: 52 mm MR PISA Radius:  1.10 cm Annalee Custovic Electronically signed by Annalee Casa Signature Date/Time: 12/25/2023/9:42:18 AM    Final    DG Chest 1 View Result Date: 12/25/2023 CLINICAL DATA:  CHF. EXAM: CHEST  1 VIEW COMPARISON:  12/24/2023  FINDINGS: The lungs are clear without focal pneumonia, edema, pneumothorax or pleural effusion. Interstitial markings are diffusely coarsened with chronic features. Cardiopericardial silhouette is at upper limits of normal for size. No acute bony abnormality. Telemetry leads overlie the chest. IMPRESSION: Chronic interstitial coarsening without acute cardiopulmonary findings. Electronically Signed   By: Camellia Candle M.D.   On: 12/25/2023 07:32   DG Chest 2 View Result Date: 12/24/2023 CLINICAL DATA:  Chest pain and shortness of breath with generalized weakness 2 days. EXAM: CHEST - 2 VIEW COMPARISON:  06/16/2022, 05/13/2021 FINDINGS: Lungs are somewhat hyperexpanded with mild flattening of the hemidiaphragms on the lateral film. There is no focal airspace consolidation or effusion. Stable right apical scarring. Cardiomediastinal silhouette and remainder of the exam is unchanged. IMPRESSION: 1. No acute cardiopulmonary disease. 2. COPD. Electronically Signed   By: Toribio Agreste M.D.   On: 12/24/2023 11:54     Labs   CBC: Recent Labs  Lab 12/24/23 1111  WBC 5.7  HGB 13.9  HCT 41.7  MCV 95.6  PLT 150    Basic Metabolic Panel: Recent Labs  Lab 12/24/23 1111 12/25/23 0427 12/25/23 1312  NA 139 137  --   K 3.7 3.9  --   CL 104 107  --   CO2 23 23  --   GLUCOSE 127* 116*  --   BUN 11 19  --   CREATININE 1.12 1.24  --   CALCIUM  8.8* 8.4*  --   MG 1.8  --  1.9   GFR: Estimated Creatinine Clearance: 60.3 mL/min (by C-G formula based on SCr of 1.24 mg/dL). Recent Labs  Lab 12/24/23 1111 12/25/23 2027  WBC 5.7  --   LATICACIDVEN  --  2.7*    Liver Function Tests: No results for input(s): AST, ALT, ALKPHOS, BILITOT, PROT, ALBUMIN in the last 168 hours. No results for input(s): LIPASE, AMYLASE in the last 168 hours. No results for input(s): AMMONIA in the last 168 hours.  ABG    Component Value Date/Time   PHART 7.35 05/14/2021 0631   PCO2ART 36 05/14/2021  0631   PO2ART 47 (L) 05/14/2021 0631   HCO3 19.9 (L) 05/14/2021 0631   ACIDBASEDEF 5.1 (H) 05/14/2021 0631   O2SAT 80.2 05/14/2021 0631     Coagulation Profile: No results for input(s): INR, PROTIME in the last 168 hours.  Cardiac Enzymes: No results for input(s): CKTOTAL, CKMB, CKMBINDEX,  TROPONINI in the last 168 hours.  HbA1C: HB A1C (BAYER DCA - WAIVED)  Date/Time Value Ref Range Status  05/09/2023 03:47 PM 5.1 4.8 - 5.6 % Final    Comment:             Prediabetes: 5.7 - 6.4          Diabetes: >6.4          Glycemic control for adults with diabetes: <7.0   08/22/2018 08:47 AM 5.4 <7.0 % Final    Comment:                                          Diabetic Adult            <7.0                                       Healthy Adult        4.3 - 5.7                                                           (DCCT/NGSP) American Diabetes Association's Summary of Glycemic Recommendations for Adults with Diabetes: Hemoglobin A1c <7.0%. More stringent glycemic goals (A1c <6.0%) may further reduce complications at the cost of increased risk of hypoglycemia.    Hgb A1c MFr Bld  Date/Time Value Ref Range Status  12/25/2023 04:27 AM 5.4 4.8 - 5.6 % Final    Comment:    (NOTE) Diagnosis of Diabetes The following HbA1c ranges recommended by the American Diabetes Association (ADA) may be used as an aid in the diagnosis of diabetes mellitus.  Hemoglobin             Suggested A1C NGSP%              Diagnosis  <5.7                   Non Diabetic  5.7-6.4                Pre-Diabetic  >6.4                   Diabetic  <7.0                   Glycemic control for                       adults with diabetes.      CBG: Recent Labs  Lab 12/24/23 1644 12/24/23 2314 12/25/23 0727 12/25/23 1140 12/25/23 1651  GLUCAP 130* 130* 154* 134* 157*    Review of Systems:   Unable to obtain as patient in distress  Past Medical History  He,  has a past medical history of  Adrenal mass (HCC) (2019), Aortic atherosclerosis (HCC), Arthritis, Atrial fibrillation (HCC), Benign neoplasm of sigmoid colon, BPH (benign prostatic hyperplasia), Dental disease, Digital mucinous cyst of finger of right hand (2018), Elevated PSA, Heart murmur, Hepatic cyst, Hepatic hemangioma, HFrEF (heart failure with reduced ejection fraction) (HCC), HLD (hyperlipidemia), HTN (hypertension), Hyperthyroidism (2019), Left thyroid  nodule (2019), Liver lesion (2018), Long  term current use of antiarrhythmic drug, Macrocytosis, Malnutrition of moderate degree (HCC) (05/15/2021), Marijuana use, Multinodular goiter, NICM (nonischemic cardiomyopathy) (HCC), Non compliance w medication regimen, On apixaban  therapy, Osteomyelitis of great toe (HCC) (05/03/2023), Peripheral vascular disease (HCC), Psoriasis (2017), Renal mass (2018), Right lower lobe pulmonary nodule (08/20/2021), Thyroid  crisis or storm (05/13/2021), and Tobacco abuse.   Surgical History    Past Surgical History:  Procedure Laterality Date   AMPUTATION TOE Left 05/17/2023   Procedure: AMPUTATION TOE METATARSOPHALANGEAL JOINT;  Surgeon: Neill Boas, DPM;  Location: [facility] ORS;  Service: Orthopedics/Podiatry;  Laterality: Left;   COLONOSCOPY WITH PROPOFOL  N/A 12/01/2015   Procedure: COLONOSCOPY WITH PROPOFOL ;  Surgeon: Rogelia Copping, MD;  Location: [facility];  Service: Endoscopy;  Laterality: N/A;   POLYPECTOMY  12/01/2015   Procedure: POLYPECTOMY;  Surgeon: Rogelia Copping, MD;  Location: [facility];  Service: Endoscopy;;   PROSTATE BIOPSY N/A 05/25/2022   Procedure: PROSTATE BIOPSY;  Surgeon: Twylla Glendia BROCKS, MD;  Location: [facility] ORS;  Service: Urology;  Laterality: N/A;   TEE WITH CARDIOVERSION N/A 05/18/2021   TRANSRECTAL ULTRASOUND N/A 05/25/2022   Procedure: TRANSRECTAL ULTRASOUND;  Surgeon: Twylla Glendia BROCKS, MD;  Location: [facility] ORS;  Service: Urology;  Laterality: N/A;     Social History   reports that he quit smoking about  2 years ago. His smoking use included cigars and cigarettes. He started smoking about 44 years ago. He has a 10.5 pack-year smoking history. He has never used smokeless tobacco. He reports current drug use. Drug: Marijuana. He reports that he does not drink alcohol.   Family History   His family history includes Diabetes in his mother; Emphysema in his father; Hyperlipidemia in his brother; Hypertension in his brother, father, and mother; Kidney disease in his mother; Sickle cell trait in his father and sister.   Allergies Allergies  Allergen Reactions   Lactose Intolerance (Gi) Diarrhea     Home Medications  Prior to Admission medications   Medication Sig Start Date End Date Taking? Authorizing Provider  albuterol  (VENTOLIN  HFA) 108 (90 Base) MCG/ACT inhaler INHALE 2 PUFFS INTO THE LUNGS EVERY 6 HOURS AS NEEDED FOR WHEEZING OR SHORTNESS OF BREATH 11/04/23  Yes Johnson, Megan P, DO  apixaban  (ELIQUIS ) 5 MG TABS tablet TAKE 1 TABLET BY MOUTH TWICE DAILY 01/28/23  Yes Johnson, Megan P, DO  digoxin  (LANOXIN ) 0.125 MG tablet Take 1 tablet (125 mcg total) by mouth daily. 06/20/23  Yes Johnson, Megan P, DO  empagliflozin  (JARDIANCE ) 10 MG TABS tablet Take 1 tablet (10 mg total) by mouth daily. 06/20/23  Yes Johnson, Megan P, DO  furosemide  (LASIX ) 40 MG tablet Take 1 tablet (40 mg total) by mouth daily. 06/20/23  Yes Johnson, Megan P, DO  losartan  (COZAAR ) 25 MG tablet Take 1 tablet (25 mg total) by mouth daily. 06/20/23  Yes Johnson, Megan P, DO  methimazole  (TAPAZOLE ) 5 MG tablet Take 5 mg by mouth See admin instructions. Take with 10 mg for a total of 15 mg daily   Yes [provider]  spironolactone  (ALDACTONE ) 25 MG tablet Take 0.5 tablets (12.5 mg total) by mouth daily. 06/20/23  Yes Johnson, Megan P, DO  atorvastatin  (LIPITOR) 20 MG tablet TAKE 1 TABLET BY MOUTH AT BEDTIME Patient not taking: Reported on 12/24/2023 06/20/23   Vicci Bouchard P, DO  methimazole  (TAPAZOLE ) 10 MG tablet Take 1  tablet (10 mg total) by mouth See admin instructions. Take with 5 mg for a total of 15 mg daily  Patient not taking: Reported on 12/24/2023 06/20/23   Vicci Bouchard P, DO  tamsulosin  (FLOMAX ) 0.4 MG CAPS capsule Take 1 capsule (0.4 mg total) by mouth daily. Patient not taking: Reported on 12/24/2023 06/20/23   Vicci Bouchard SQUIBB, DO     Active Hospital Problem list   See systems below  Assessment & Plan:  #Cardiogenic Shock #Afib RVR s/p DCCV at outside hospital #HFrEF, LVEF 20% with global hypokinesis #Severe mitral regurgitation -Supplemental oxygen or BiPAP with goal >92% -High risk for intubation -Obtain 2D Echo -trend VBG/lactate  -Hold home GMDT iso shock -start Levophed  for MAP Goal -Holding anticoagulation per Dr. Lurena -Start milrinone  0.125mcg/kg/min with goal CI >2.2, central sat >65% -Cardiology consult eval for RHC can help determine filling pressures -Discussed with on call Cardiology Dr. Shona and STEMI on call Dr. Lurena Red who both agreed with plans for emergent [facility] +/- MCS and Transfer for: IABP, Impella, VA-ECMO, VAD management if indicated post cath. -Avoid NSAIDs, ACE/ARB and consider holding CHF and HTN medications based on hemodynamic status    Best practice:  Diet:  NPO Pain/Anxiety/Delirium protocol (if indicated): No VAP protocol (if indicated): Not indicated DVT prophylaxis: Contraindicated GI prophylaxis: H2B Glucose control:  SSI No Central venous access:  Yes, and it is still needed Arterial line:  N/A Foley:  Yes, and it is still needed Mobility:  bed rest  PT consulted: N/A Last date of multidisciplinary goals of care discussion []  Code Status:  full code Disposition: ICU   = Goals of Care =   Primary Emergency ContactBETHA Malvina American, Home Phone: [phone]   Critical care time: 45 minutes        Almarie Nose DNP, CCRN, FNP-C, AGACNP-BC Acute Care & Family Nurse Practitioner [facility] PCCM  on call pager [phone]

## 2023-12-25 NOTE — Progress Notes (Addendum)
 Received report from dayshift nurse that patient's was hypotensive and without an iv access. At bedside patient was complaining of chest pain, nausea and dyspnea. Patient blood pressure was 76/62. Notified Dr. Debbie of patient's current conditions. Dr.Mansy consulted with Ouma NP about placing a central line. A levophed  drip was initiated to improve blood pressure. Patient was given morphine  for pain an zofran  for nausea. A code STEMI was called. A central line was placed by NP Ouma, verification completed by x-ray. The cardiologist arrived to perform a catherization. Patient was started on a milrinone  dripped. Nursing staff escorted patient to cath lab via inpatient bed. Patient continues to have chest pain. Patient's sister has been updated about patient's condition. Patient's belongings, including phone and clothes has been given to cath lab staff.  Family member [name] Well updated about plan of care. Patient will be transferred to 2 heart room 14 after procedure.

## 2023-12-25 NOTE — Progress Notes (Addendum)
 Dr. Lawence notified of patient having run of SVT with HR jumping to 150 and back down to 130's. PRN order 5mg  metoprolol  administered to patient. HR remained in the 130's. One time dose of 2.5 mg of metoprolol  and 250 bolus received and given @ 0440.   Patients HR remaining in the 135-140's. Dr. Lawence notified. New order of 5mg  Cardizem  and 250mL bolus of NS received and administered @ 0541 with no improvement.   HR sustaining in the 140's, Dr. Lawence notified. Per Dr.Mansy to pass onto oncoming dayshift hospitalist to consult cardiology. No other orders at this time.

## 2023-12-25 NOTE — Procedures (Signed)
 CENTRAL VENOUS CATHETER INSERTION PROCEDURE NOTE  Tyler T Zellars Jr.  [id number]  02/03/1956  Date:12/25/23  Time:10:19 PM   Provider Performing:Tyler Cohen   Procedure: Insertion of Non-tunneled Central Venous [phone]) with US  guidance (23062)   Indication(s) Medication administration and Difficult access  Consent Risks of the procedure as well as the alternatives and risks of each were explained to the patient and/or caregiver.  Consent for the procedure was obtained and is signed in the bedside chart  Anesthesia Topical only with 1% lidocaine    Timeout Verified patient identification, verified procedure, site/side was marked, verified correct patient position, special equipment/implants available, medications/allergies/relevant history reviewed, required imaging and test results available.  Sterile Technique Maximal sterile technique including full sterile barrier drape, hand hygiene, sterile gown, sterile gloves, mask, hair covering, sterile ultrasound probe cover (if used).  Procedure Description Area of catheter insertion was cleaned with chlorhexidine  and draped in sterile fashion.  With real-time ultrasound guidance a central venous catheter was placed into the left internal jugular vein. Nonpulsatile blood flow and easy flushing noted in all ports.  The catheter was sutured in place and sterile dressing applied.  Complications/Tolerance None; patient tolerated the procedure well. Chest X-ray is ordered to verify placement for internal jugular or subclavian cannulation.   Chest x-ray is not ordered for femoral cannulation.  EBL Minimal  Specimen(s) None    Tyler Kathrene, DNP, CCRN, FNP-C, AGACNP-BC Acute Care & Family Nurse Practitioner  [facility]  See Amion for personal pager PCCM on call pager [phone] until 7 am

## 2023-12-25 NOTE — Progress Notes (Signed)
 [facility]  - [place] [facility]   PATIENT NAME: Tyler Cohen    MR#:  [id number]  DATE OF BIRTH:  06/30/1955  SUBJECTIVE:  patient seen earlier and then met sister at bedside. Came in with increasing palpitations and was found to be with a fib RVR heartrate in the 140s. Patient recently had taken DayQuil and NyQuil due to URI symptoms likely triggered his symptoms. He was discharged from some hospital in [place] details not available when he was admitted for his thyroid  treatment and cardioverted  VITALS:  Blood pressure 124/88, pulse (!) 119, temperature 98.7 F (37.1 C), temperature source Oral, resp. rate 17, height 6' 1 (1.854 m), weight 74.8 kg, SpO2 97%.  PHYSICAL EXAMINATION:   GENERAL:  68 y.o.-year-old patient with no acute distress. thin LUNGS: Normal breath sounds bilaterally CARDIOVASCULAR: S1, S2 normal. No murmur  tachyarrhythmia ABDOMEN: Soft, nontender, nondistended.   EXTREMITIES: No  edema b/l.    NEUROLOGIC: nonfocal  patient is alert and awake   LABORATORY PANEL:  CBC Recent Labs  Lab 12/24/23 1111  WBC 5.7  HGB 13.9  HCT 41.7  PLT 150    Chemistries  Recent Labs  Lab 12/25/23 0427 12/25/23 1312  NA 137  --   K 3.9  --   CL 107  --   CO2 23  --   GLUCOSE 116*  --   BUN 19  --   CREATININE 1.24  --   CALCIUM  8.4*  --   MG  --  1.9   Cardiac Enzymes No results for input(s): TROPONINI in the last 168 hours. RADIOLOGY:  ECHOCARDIOGRAM COMPLETE Result Date: 12/25/2023    ECHOCARDIOGRAM REPORT   Patient Name:   Tyler Cohen Date of Exam: 12/25/2023 Medical Rec #:  [id number]       Height:       73.0 in Accession #:    [id number]      Weight:       165.0 lb Date of Birth:  Apr 11, 1955       BSA:          1.983 m Patient Age:    68 years        BP:           107/80 mmHg Patient Gender: M               HR:           119 bpm. Exam Location:  [facility] Procedure: 2D Echo, 3D Echo, Cardiac Doppler, Color Doppler and Strain Analysis             (Both Spectral and Color Flow Doppler were utilized during            procedure). Indications:     R07.89 Other chest pain; R07.9* Chest pain, unspecified  History:         Patient has prior history of Echocardiogram examinations, most                  recent 05/14/2021. Signs/Symptoms:Chest Pain.  Sonographer:     Doyal Point MHA, BS, RDCS Referring Phys:  [id number] CORT ONEIDA MANA Diagnosing Phys: Annalee Custovic IMPRESSIONS  1. Left ventricular ejection fraction, by estimation, is 20 to 25%. Left ventricular ejection fraction by PLAX is 21 %. The left ventricle has severely decreased function. The left ventricle demonstrates global hypokinesis. Left ventricular diastolic parameters are indeterminate.  2. Right ventricular systolic function is normal. The right ventricular size is  normal.  3. Left atrial size was severely dilated.  4. The mitral valve is abnormal. Severe mitral valve regurgitation. No evidence of mitral stenosis.  5. Tricuspid valve regurgitation is mild to moderate.  6. The aortic valve is normal in structure. Aortic valve regurgitation is trivial. No aortic stenosis is present.  7. The inferior vena cava is normal in size with greater than 50% respiratory variability, suggesting right atrial pressure of 3 mmHg. FINDINGS  Left Ventricle: Left ventricular ejection fraction, by estimation, is 20 to 25%. Left ventricular ejection fraction by PLAX is 21 %. The left ventricle has severely decreased function. The left ventricle demonstrates global hypokinesis. The left ventricular internal cavity size was normal in size. There is no left ventricular hypertrophy. Left ventricular diastolic parameters are indeterminate. Right Ventricle: The right ventricular size is normal. No increase in right ventricular wall thickness. Right ventricular systolic function is normal. Left Atrium: Left atrial size was severely dilated. Right Atrium: Right atrial size was normal in size. Pericardium: There is no  evidence of pericardial effusion. Mitral Valve: The mitral valve is abnormal. Severe mitral valve regurgitation. No evidence of mitral valve stenosis. Tricuspid Valve: The tricuspid valve is normal in structure. Tricuspid valve regurgitation is mild to moderate. Aortic Valve: The aortic valve is normal in structure. Aortic valve regurgitation is trivial. No aortic stenosis is present. Aortic valve mean gradient measures 3.0 mmHg. Aortic valve peak gradient measures 5.0 mmHg. Aortic valve area, by VTI measures 2.27 cm. Pulmonic Valve: The pulmonic valve was normal in structure. Pulmonic valve regurgitation is mild. Aorta: The aortic root is normal in size and structure. Venous: The inferior vena cava is normal in size with greater than 50% respiratory variability, suggesting right atrial pressure of 3 mmHg. IAS/Shunts: No atrial level shunt detected by color flow Doppler.  LEFT VENTRICLE PLAX 2D LV EF:         Left ventricular ejection fraction by PLAX is 21 %. LVIDd:         6.00 cm LVIDs:         5.40 cm LV PW:         1.10 cm LV IVS:        0.90 cm LVOT diam:     2.30 cm LV SV:         39 LV SV Index:   20 LVOT Area:     4.15 cm  RIGHT VENTRICLE RV Basal diam:  4.25 cm RV Mid diam:    3.40 cm RV S prime:     14.90 cm/s TAPSE (M-mode): 1.7 cm LEFT ATRIUM              Index        RIGHT ATRIUM           Index LA diam:        3.80 cm  1.92 cm/m   RA Area:     20.40 cm LA Vol (A2C):   163.0 ml 82.21 ml/m  RA Volume:   65.10 ml  32.83 ml/m LA Vol (A4C):   147.0 ml 74.14 ml/m LA Biplane Vol: 161.0 ml 81.20 ml/m  AORTIC VALVE AV Area (Vmax):    2.47 cm AV Area (Vmean):   2.34 cm AV Area (VTI):     2.27 cm AV Vmax:           112.00 cm/s AV Vmean:          79.100 cm/s AV VTI:  0.171 m AV Peak Grad:      5.0 mmHg AV Mean Grad:      3.0 mmHg LVOT Vmax:         66.60 cm/s LVOT Vmean:        44.500 cm/s LVOT VTI:          0.094 m LVOT/AV VTI ratio: 0.55  AORTA Ao Root diam: 3.90 cm MR Peak grad:    124.5  mmHg MR Mean grad:    83.0 mmHg    SHUNTS MR Vmax:         558.00 cm/s  Systemic VTI:  0.09 m MR Vmean:        431.0 cm/s   Systemic Diam: 2.30 cm MR PISA:         7.60 cm MR PISA Eff ROA: 52 mm MR PISA Radius:  1.10 cm Annalee Custovic Electronically signed by Annalee Casa Signature Date/Time: 12/25/2023/9:42:18 AM    Final    DG Chest 1 View Result Date: 12/25/2023 CLINICAL DATA:  CHF. EXAM: CHEST  1 VIEW COMPARISON:  12/24/2023 FINDINGS: The lungs are clear without focal pneumonia, edema, pneumothorax or pleural effusion. Interstitial markings are diffusely coarsened with chronic features. Cardiopericardial silhouette is at upper limits of normal for size. No acute bony abnormality. Telemetry leads overlie the chest. IMPRESSION: Chronic interstitial coarsening without acute cardiopulmonary findings. Electronically Signed   By: Camellia Candle M.D.   On: 12/25/2023 07:32   DG Chest 2 View Result Date: 12/24/2023 CLINICAL DATA:  Chest pain and shortness of breath with generalized weakness 2 days. EXAM: CHEST - 2 VIEW COMPARISON:  06/16/2022, 05/13/2021 FINDINGS: Lungs are somewhat hyperexpanded with mild flattening of the hemidiaphragms on the lateral film. There is no focal airspace consolidation or effusion. Stable right apical scarring. Cardiomediastinal silhouette and remainder of the exam is unchanged. IMPRESSION: 1. No acute cardiopulmonary disease. 2. COPD. Electronically Signed   By: Toribio Agreste M.D.   On: 12/24/2023 11:54    Assessment and Plan  anhook Mickey. is a 68 y.o. male with medical history significant of chronic HFrEF with LVEF 20-25%, PAF on Eliquis , hypothyroidism on methimazole , thyroid  nodule status post I-131 ablation, prostate cancer status post radiation therapy, presented with cough shortness of breath palpitations, and chest pain.   A fib with RVR severe MR severe cardiomyopathy EF less 20 to 25% enlarge the left atrium -- patient was tried on Cardizem  and beta-blockers in  the ED. -- He received few doses of digoxin -- digoxin  level 3.3-- will hold dig for now -- patient was tachycardic in the 140s but soft blood pressure. Gave loading dose of amiodarone  after discussing with Dr.custovic (chat) -- given thyroid  issues patient now started on oral sotalol  -- heartrate 92-- 130s -- continue eliquis  -- PRN beta-blockers if needed --Dr Custovic has attempted to get pt transferred to [facility] for MVR--no beds--f/u with her tomorrow  History of hyperthyroidism - Patient and his wife reported that patient has been compliant with methimazole  - Thyroid  studies today showed similar T4 and TSH reading as baseline outpatient thyroid  lab has been showing for the last 2 years, thyroid  storm ruled out. - Continue methimazole  15 mg daily -- patient mentioned he was admitted somewhere in [place] regarding his thyroid  treatment he is unable to give me any details  Chronic HFrEF - Euvolemic - Blood pressure borderline low, hold off diuresis, hold off ARB and spironolactone    COPD - No symptoms or signs of acute exacerbation - Change albuterol  to Atrovent   for breathing treatment - Add ICS  Procedures: Family communication :sister at bedside Consults : [facility] cardiology CODE STATUS: full DVT Prophylaxis : eliquis   level of care: Stepdown Status is: Inpatient Remains inpatient appropriate because: severe MR, rapid afib, severe cardiomyopathy    TOTAL criticalTIME TAKING CARE OF THIS PATIENT: 50 minutes.  >50% time spent on counselling and coordination of care  Note: This dictation was prepared with Dragon dictation along with smaller phrase technology. Any transcriptional errors that result from this process are unintentional.  Leita Blanch M.D    [facility]   CC: Primary care physician; Vicci Duwaine SQUIBB, DO

## 2023-12-25 NOTE — Consult Note (Signed)
 [facility] CLINIC CARDIOLOGY CONSULT NOTE       Patient ID: Tyler Cohen. MRN: [id number] DOB/AGE: 68-10-1955 68 y.o.  Admit date: 12/24/2023 Referring Physician Dr Tobie Primary Physician Vicci Duwaine SQUIBB, DO Primary Cardiologist Dr Florencio Reason for Consultation Afib RVR  HPI: Tyler Chock. is a 68 y.o. male  with a past medical history of chronic HFrEF with LVEF 20-25%, PAF on Eliquis , hypothyroidism on methimazole , thyroid  nodule status post I-131 ablation, prostate cancer status post radiation therapy presented with cough shortness of breath palpitations, and chest pain. Cardiology was placed on consultation for Afib RVR. His Afib has been refractory to cardizem  and BP has been soft for additional lopressor  until this morning. Patient feeling relatively ok however he does still have chest pain from sustained tachycardia.  Review of systems complete and found to be negative unless listed above     Past Medical History:  Diagnosis Date   Adrenal mass (HCC) 2019   Aortic atherosclerosis (HCC)    Arthritis    Atrial fibrillation (HCC)    a.) CHA2DS2VASc = 4 (age, CHF, HTN, vascular disease history);  b.) s/p DCCV (300 J x 1) 05/18/2021; c.) rate/rhythm maintained on oral digoxin ; chronically anticoagulated with apixaban    Benign neoplasm of sigmoid colon    BPH (benign prostatic hyperplasia)    Dental disease    Digital mucinous cyst of finger of right hand 2018   Elevated PSA    Heart murmur    Hepatic cyst    Hepatic hemangioma    HFrEF (heart failure with reduced ejection fraction) (HCC)    a.) TTE 05/14/2021: EF 25-30%, glob HK, mod dil LV, RVE, PASP 39.8; b.) TTE 06/25/2021: EF 30%, LAE, triv AR/PR, mild TR, mod MR, G1DD   HLD (hyperlipidemia)    HTN (hypertension)    Hyperthyroidism 2019   Left thyroid  nodule 2019   Liver lesion 2018   Long term current use of antiarrhythmic drug    a.) digoxin    Macrocytosis    Malnutrition of moderate degree (HCC)  05/15/2021   Marijuana use    Multinodular goiter    NICM (nonischemic cardiomyopathy) (HCC)    a.) TTE 05/14/2021: EF 25-30%; b.) TTE 06/25/2021: EF 30%   Non compliance w medication regimen    On apixaban  therapy    Osteomyelitis of great toe (HCC) 05/03/2023   Peripheral vascular disease (HCC)    Psoriasis 2017   Renal mass 2018   Right lower lobe pulmonary nodule 08/20/2021   a.) cCTA 08/20/2021: measured 7 x 5 mm.   Thyroid  crisis or storm 05/13/2021   Tobacco abuse     Past Surgical History:  Procedure Laterality Date   AMPUTATION TOE Left 05/17/2023   Procedure: AMPUTATION TOE METATARSOPHALANGEAL JOINT;  Surgeon: Neill Boas, DPM;  Location: [facility];  Service: Orthopedics/Podiatry;  Laterality: Left;   COLONOSCOPY WITH PROPOFOL  N/A 12/01/2015   Procedure: COLONOSCOPY WITH PROPOFOL ;  Surgeon: Rogelia Copping, MD;  Location: [facility];  Service: Endoscopy;  Laterality: N/A;   POLYPECTOMY  12/01/2015   Procedure: POLYPECTOMY;  Surgeon: Rogelia Copping, MD;  Location: [facility];  Service: Endoscopy;;   PROSTATE BIOPSY N/A 05/25/2022   Procedure: PROSTATE BIOPSY;  Surgeon: Twylla Glendia BROCKS, MD;  Location: [facility];  Service: Urology;  Laterality: N/A;   TEE WITH CARDIOVERSION N/A 05/18/2021   TRANSRECTAL ULTRASOUND N/A 05/25/2022   Procedure: TRANSRECTAL ULTRASOUND;  Surgeon: Twylla Glendia BROCKS, MD;  Location: [facility];  Service: Urology;  Laterality: N/A;    Medications Prior to Admission  Medication Sig Dispense Refill Last Dose/Taking   albuterol  (VENTOLIN  HFA) 108 (90 Base) MCG/ACT inhaler INHALE 2 PUFFS INTO THE LUNGS EVERY 6 HOURS AS NEEDED FOR WHEEZING OR SHORTNESS OF BREATH 8.5 g 3 Taking As Needed   apixaban  (ELIQUIS ) 5 MG TABS tablet TAKE 1 TABLET BY MOUTH TWICE DAILY 180 tablet 3 12/21/2023   digoxin  (LANOXIN ) 0.125 MG tablet Take 1 tablet (125 mcg total) by mouth daily.   12/21/2023   empagliflozin  (JARDIANCE ) 10 MG TABS tablet Take 1 tablet (10 mg total) by  mouth daily. 90 tablet 1 12/21/2023   furosemide  (LASIX ) 40 MG tablet Take 1 tablet (40 mg total) by mouth daily.   12/21/2023   losartan  (COZAAR ) 25 MG tablet Take 1 tablet (25 mg total) by mouth daily. 90 tablet 1 12/21/2023   methimazole  (TAPAZOLE ) 5 MG tablet Take 5 mg by mouth See admin instructions. Take with 10 mg for a total of 15 mg daily   12/23/2023   spironolactone  (ALDACTONE ) 25 MG tablet Take 0.5 tablets (12.5 mg total) by mouth daily. 45 tablet 1 12/21/2023   atorvastatin  (LIPITOR) 20 MG tablet TAKE 1 TABLET BY MOUTH AT BEDTIME (Patient not taking: Reported on 12/24/2023) 90 tablet 2 Not Taking   methimazole  (TAPAZOLE ) 10 MG tablet Take 1 tablet (10 mg total) by mouth See admin instructions. Take with 5 mg for a total of 15 mg daily (Patient not taking: Reported on 12/24/2023)   Not Taking   tamsulosin  (FLOMAX ) 0.4 MG CAPS capsule Take 1 capsule (0.4 mg total) by mouth daily. (Patient not taking: Reported on 12/24/2023) 90 capsule 1 Not Taking   Social History   Socioeconomic History   Marital status: Married    Spouse name: Not on file   Number of children: Not on file   Years of education: Not on file   Highest education level: High school graduate  Occupational History   Not on file  Tobacco Use   Smoking status: Former    Current packs/day: 0.00    Average packs/day: 0.3 packs/day for 42.0 years (10.5 ttl pk-yrs)    Types: Cigars, Cigarettes    Start date: 05/18/1979    Quit date: 05/17/2021    Years since quitting: 2.6   Smokeless tobacco: Never  Vaping Use   Vaping status: Never Used  Substance and Sexual Activity   Alcohol use: No   Drug use: Yes    Types: Marijuana    Comment: pt states he smokes every once in a while   Sexual activity: Yes    Birth control/protection: None  Other Topics Concern   Not on file  Social History Narrative   Works part time.   Social Drivers of Corporate investment banker Strain: Low Risk  (05/23/2023)   Received from [facility]   Overall Financial Resource Strain (CARDIA)    Difficulty of Paying Living Expenses: Not hard at all  Food Insecurity: No Food Insecurity (12/24/2023)   Hunger Vital Sign    Worried About Running Out of Food in the Last Year: Never true    Ran Out of Food in the Last Year: Never true  Transportation Needs: No Transportation Needs (12/24/2023)   PRAPARE - Administrator, Civil Service (Medical): No    Lack of Transportation (Non-Medical): No  Physical Activity: Insufficiently Active (01/12/2022)   Exercise Vital Sign    Days of Exercise per Week:  3 days    Minutes of Exercise per Session: 30 min  Stress: Stress Concern Present (01/12/2022)   [employer] of Occupational Health - Occupational Stress Questionnaire    Feeling of Stress : To some extent  Social Connections: Moderately Isolated (12/24/2023)   Social Connection and Isolation Panel    Frequency of Communication with Friends and Family: Twice a week    Frequency of Social Gatherings with Friends and Family: Never    Attends Religious Services: More than 4 times per year    Active Member of Clubs or Organizations: Yes    Attends Banker Meetings: More than 4 times per year    Marital Status: Never married  Intimate Partner Violence: Not At Risk (12/24/2023)   Humiliation, Afraid, Rape, and Kick questionnaire    Fear of Current or Ex-Partner: No    Emotionally Abused: No    Physically Abused: No    Sexually Abused: No    Family History  Problem Relation Age of Onset   Hypertension Mother    Diabetes Mother        lost both legs   Kidney disease Mother    Hypertension Father    Emphysema Father    Sickle cell trait Father    Hypertension Brother    Hyperlipidemia Brother    Sickle cell trait Sister      Vitals:   12/25/23 0630 12/25/23 0645 12/25/23 0715 12/25/23 0800  BP: 107/76 113/77 107/80 107/69  Pulse: (!) 139 (!) 139 (!) 149 100  Resp: 19 (!) 21 (!) 24  20  Temp:    98.7 F (37.1 C)  TempSrc:    Oral  SpO2: 96% 93% 96% 100%  Weight:      Height:        PHYSICAL EXAM General: awake, well nourished, in no acute distress. HEENT: Normocephalic and atraumatic. Neck: No JVD.  Lungs: Normal respiratory effort. Clear bilaterally to auscultation. No wheezes, crackles, rhonchi.  Heart: HiRRR. Normal S1 and S2 without gallops. +murmurs.  Abdomen: Non-distended appearing.  Msk: Normal strength and tone for age. Extremities: Warm and well perfused. No clubbing, cyanosis. no edema.  Neuro: Alert and oriented X 3. Psych: Answers questions appropriately.   Labs: Basic Metabolic Panel: Recent Labs    12/24/23 1111 12/25/23 0427  NA 139 137  K 3.7 3.9  CL 104 107  CO2 23 23  GLUCOSE 127* 116*  BUN 11 19  CREATININE 1.12 1.24  CALCIUM  8.8* 8.4*  MG 1.8  --    Liver Function Tests: No results for input(s): AST, ALT, ALKPHOS, BILITOT, PROT, ALBUMIN in the last 72 hours. No results for input(s): LIPASE, AMYLASE in the last 72 hours. CBC: Recent Labs    12/24/23 1111  WBC 5.7  HGB 13.9  HCT 41.7  MCV 95.6  PLT 150   Cardiac Enzymes: Recent Labs    12/24/23 1111 12/24/23 1324  TROPONINIHS 20* 20*   BNP: Recent Labs    12/24/23 1111  BNP 818.6*   D-Dimer: Recent Labs    12/24/23 1111  DDIMER 0.43   Hemoglobin A1C: No results for input(s): HGBA1C in the last 72 hours. Fasting Lipid Panel: No results for input(s): CHOL, HDL, LDLCALC, TRIG, CHOLHDL, LDLDIRECT in the last 72 hours. Thyroid  Function Tests: Recent Labs    12/24/23 1111  TSH <0.100*   Anemia Panel: No results for input(s): VITAMINB12, FOLATE, FERRITIN, TIBC, IRON, RETICCTPCT in the last 72 hours.   Radiology: ECHOCARDIOGRAM COMPLETE  Result Date: 12/25/2023    ECHOCARDIOGRAM REPORT   Patient Name:   Tyler Cohen Date of Exam: 12/25/2023 Medical Rec #:  [id number]       Height:       73.0 in Accession #:     [id number]      Weight:       165.0 lb Date of Birth:  03-15-1956       BSA:          1.983 m Patient Age:    75 years        BP:           107/80 mmHg Patient Gender: M               HR:           119 bpm. Exam Location:  [facility] Procedure: 2D Echo, 3D Echo, Cardiac Doppler, Color Doppler and Strain Analysis            (Both Spectral and Color Flow Doppler were utilized during            procedure). Indications:     R07.89 Other chest pain; R07.9* Chest pain, unspecified  History:         Patient has prior history of Echocardiogram examinations, most                  recent 05/14/2021. Signs/Symptoms:Chest Pain.  Sonographer:     Doyal Point MHA, BS, RDCS Referring Phys:  [id number] CORT ONEIDA MANA Diagnosing Phys: Annalee Tenita Cue IMPRESSIONS  1. Left ventricular ejection fraction, by estimation, is 20 to 25%. Left ventricular ejection fraction by PLAX is 21 %. The left ventricle has severely decreased function. The left ventricle demonstrates global hypokinesis. Left ventricular diastolic parameters are indeterminate.  2. Right ventricular systolic function is normal. The right ventricular size is normal.  3. Left atrial size was severely dilated.  4. The mitral valve is abnormal. Severe mitral valve regurgitation. No evidence of mitral stenosis.  5. Tricuspid valve regurgitation is mild to moderate.  6. The aortic valve is normal in structure. Aortic valve regurgitation is trivial. No aortic stenosis is present.  7. The inferior vena cava is normal in size with greater than 50% respiratory variability, suggesting right atrial pressure of 3 mmHg. FINDINGS  Left Ventricle: Left ventricular ejection fraction, by estimation, is 20 to 25%. Left ventricular ejection fraction by PLAX is 21 %. The left ventricle has severely decreased function. The left ventricle demonstrates global hypokinesis. The left ventricular internal cavity size was normal in size. There is no left ventricular hypertrophy. Left ventricular diastolic  parameters are indeterminate. Right Ventricle: The right ventricular size is normal. No increase in right ventricular wall thickness. Right ventricular systolic function is normal. Left Atrium: Left atrial size was severely dilated. Right Atrium: Right atrial size was normal in size. Pericardium: There is no evidence of pericardial effusion. Mitral Valve: The mitral valve is abnormal. Severe mitral valve regurgitation. No evidence of mitral valve stenosis. Tricuspid Valve: The tricuspid valve is normal in structure. Tricuspid valve regurgitation is mild to moderate. Aortic Valve: The aortic valve is normal in structure. Aortic valve regurgitation is trivial. No aortic stenosis is present. Aortic valve mean gradient measures 3.0 mmHg. Aortic valve peak gradient measures 5.0 mmHg. Aortic valve area, by VTI measures 2.27 cm. Pulmonic Valve: The pulmonic valve was normal in structure. Pulmonic valve regurgitation is mild. Aorta: The aortic root is normal in size and structure. Venous: The inferior  vena cava is normal in size with greater than 50% respiratory variability, suggesting right atrial pressure of 3 mmHg. IAS/Shunts: No atrial level shunt detected by color flow Doppler.  LEFT VENTRICLE PLAX 2D LV EF:         Left ventricular ejection fraction by PLAX is 21 %. LVIDd:         6.00 cm LVIDs:         5.40 cm LV PW:         1.10 cm LV IVS:        0.90 cm LVOT diam:     2.30 cm LV SV:         39 LV SV Index:   20 LVOT Area:     4.15 cm  RIGHT VENTRICLE RV Basal diam:  4.25 cm RV Mid diam:    3.40 cm RV S prime:     14.90 cm/s TAPSE (M-mode): 1.7 cm LEFT ATRIUM              Index        RIGHT ATRIUM           Index LA diam:        3.80 cm  1.92 cm/m   RA Area:     20.40 cm LA Vol (A2C):   163.0 ml 82.21 ml/m  RA Volume:   65.10 ml  32.83 ml/m LA Vol (A4C):   147.0 ml 74.14 ml/m LA Biplane Vol: 161.0 ml 81.20 ml/m  AORTIC VALVE AV Area (Vmax):    2.47 cm AV Area (Vmean):   2.34 cm AV Area (VTI):     2.27 cm  AV Vmax:           112.00 cm/s AV Vmean:          79.100 cm/s AV VTI:            0.171 m AV Peak Grad:      5.0 mmHg AV Mean Grad:      3.0 mmHg LVOT Vmax:         66.60 cm/s LVOT Vmean:        44.500 cm/s LVOT VTI:          0.094 m LVOT/AV VTI ratio: 0.55  AORTA Ao Root diam: 3.90 cm MR Peak grad:    124.5 mmHg MR Mean grad:    83.0 mmHg    SHUNTS MR Vmax:         558.00 cm/s  Systemic VTI:  0.09 m MR Vmean:        431.0 cm/s   Systemic Diam: 2.30 cm MR PISA:         7.60 cm MR PISA Eff ROA: 52 mm MR PISA Radius:  1.10 cm Annalee Shaquoia Miers Electronically signed by Annalee Casa Signature Date/Time: 12/25/2023/9:42:18 AM    Final    DG Chest 1 View Result Date: 12/25/2023 CLINICAL DATA:  CHF. EXAM: CHEST  1 VIEW COMPARISON:  12/24/2023 FINDINGS: The lungs are clear without focal pneumonia, edema, pneumothorax or pleural effusion. Interstitial markings are diffusely coarsened with chronic features. Cardiopericardial silhouette is at upper limits of normal for size. No acute bony abnormality. Telemetry leads overlie the chest. IMPRESSION: Chronic interstitial coarsening without acute cardiopulmonary findings. Electronically Signed   By: Camellia Candle M.D.   On: 12/25/2023 07:32   DG Chest 2 View Result Date: 12/24/2023 CLINICAL DATA:  Chest pain and shortness of breath with generalized weakness 2 days. EXAM: CHEST - 2 VIEW COMPARISON:  06/16/2022, 05/13/2021 FINDINGS: Lungs are somewhat hyperexpanded with mild flattening of the hemidiaphragms on the lateral film. There is no focal airspace consolidation or effusion. Stable right apical scarring. Cardiomediastinal silhouette and remainder of the exam is unchanged. IMPRESSION: 1. No acute cardiopulmonary disease. 2. COPD. Electronically Signed   By: Toribio Agreste M.D.   On: 12/24/2023 11:54    ECHO as above  TELEMETRY reviewed by me [facility]) 12/25/2023 : Atrial flutter, rates 130s   Data reviewed by me [facility]) 12/25/2023: last 24h vitals tele labs imaging I/O  provider notes  Principal Problem:   Afib [facility]) Active Problems:   Atrial fibrillation (HCC)    ASSESSMENT AND PLAN:   Afib RVR s/p DCCV at outside hospital HFrEF, LVEF 20% with global hypokinesis Severe mitral regurgitation No cardizem  as LVEF is 20% IVP Lopressor  5mg  q13mins x3 doses. Will try sotalol  if patient remains in RVR. Maintain MAP >60. Optimize lytes, keep K>4 and Mag>2. Continue on telemetry. Currently on amio gtt. Monitor thyroid  closely. Will plan to transfer to [facility] given severe MR, patient would benefit from valve replacement.  No plans to cardiovert given blown out left atrium and wide open MR.   Signed: Darnelle Derrick, DO 12/25/2023, 10:41 AM [facility] Cardiology

## 2023-12-25 NOTE — Progress Notes (Addendum)
 Critical care note:  Date of note: 12/25/2023.  Subjective: The patient has been having persistent hypotension despite boluses of IV normal saline.  He started having 8/10 substernal chest pain.  No nausea or vomiting or diaphoresis.  No fever or chills.  No cough or wheezing or dyspnea.  He was ordered admitted for atrial fibrillation with RVR for which she was placed on IV amiodarone  bolus and drip.SABRA  His rate has been controlled.  Objective: Physical examination: Generally: Acutely ill elderly African-American male in moderate distress from Vital signs : BP was 72/56 with a MAP of 63 and later 76/62 with a MAP of 68, respiratory rate was 19 and later 32 and pulse oximetry is 95 to 96% on 2 L of O2 per nasal cannula.  He has been afebrile earlier. Head - atraumatic, normocephalic.  Pupils - equal, round and reactive to light and accommodation. Extraocular movements are intact. No scleral icterus.  Oropharynx - moist mucous membranes and tongue. No pharyngeal erythema or exudate.  Neck - supple. No JVD. Carotid pulses 2+ bilaterally. No carotid bruits. No palpable thyromegaly or lymphadenopathy. Cardiovascular - regular rate and rhythm. Normal S1 and S2. No murmurs, gallops or rubs.  Lungs - clear to auscultation bilaterally.  Abdomen - soft and nontender. Positive bowel sounds. No palpable organomegaly or masses.  Extremities - no pitting edema, clubbing or cyanosis.  Neuro - grossly non-focal. Skin - no rashes. GU and rectal exam - deferred.  Labs and notes were reviewed.  Assessment/plan: 1.  Hypotension and developing shock in the setting of heart failure with reduced EF concerning for cardiogenic shock especially with associated chest pain, rule out ACS. - The patient was ordered IV Levophed  and his CVP was inserted by the ICU team. - We discussed the case with his attending cardiologist Dr. Dewane and our interventional/STEMI cardiologist Dr. Wendel , given his deterioration and  potential need for intra-aortic balloon pump should he not respond to pressors and continued to have chest pain and possible need for cardiac catheterization on an urgent basis. - The case was transferred to ICU care after starting IV pressor therapy.  Authorized and performed by: Madison Peaches, MD Total critical care time:   35     minutes. Due to a high probability of clinically significant, life-threatening deterioration, the patient required my highest level of preparedness to intervene emergently and I personally spent this critical care time directly and personally managing the patient.  This critical care time included obtaining a history, examining the patient, pulse oximetry, ordering and review of studies, arranging urgent treatment with development of management plan, evaluation of patient's response to treatment, frequent reassessment, and discussions with other providers. This critical care time was performed to assess and manage the high probability of imminent, life-threatening deterioration that could result in multiorgan failure.  It was exclusive of separately billable procedures and treating other patients and teaching time.

## 2023-12-26 ENCOUNTER — Encounter: Payer: Self-pay | Admitting: Internal Medicine

## 2023-12-26 ENCOUNTER — Inpatient Hospital Stay (HOSPITAL_COMMUNITY)

## 2023-12-26 ENCOUNTER — Inpatient Hospital Stay (HOSPITAL_COMMUNITY)
Admission: EM | Admit: 2023-12-26 | Discharge: 2024-01-01 | DRG: 291 | Source: Ambulatory Visit | Attending: Cardiology | Admitting: Cardiology

## 2023-12-26 DIAGNOSIS — I4892 Unspecified atrial flutter: Secondary | ICD-10-CM | POA: Diagnosis present

## 2023-12-26 DIAGNOSIS — N400 Enlarged prostate without lower urinary tract symptoms: Secondary | ICD-10-CM | POA: Diagnosis present

## 2023-12-26 DIAGNOSIS — E739 Lactose intolerance, unspecified: Secondary | ICD-10-CM | POA: Diagnosis not present

## 2023-12-26 DIAGNOSIS — I509 Heart failure, unspecified: Secondary | ICD-10-CM

## 2023-12-26 DIAGNOSIS — I110 Hypertensive heart disease with heart failure: Principal | ICD-10-CM | POA: Diagnosis present

## 2023-12-26 DIAGNOSIS — R570 Cardiogenic shock: Secondary | ICD-10-CM | POA: Diagnosis not present

## 2023-12-26 DIAGNOSIS — Q2112 Patent foramen ovale: Secondary | ICD-10-CM | POA: Diagnosis not present

## 2023-12-26 DIAGNOSIS — Z833 Family history of diabetes mellitus: Secondary | ICD-10-CM

## 2023-12-26 DIAGNOSIS — I5023 Acute on chronic systolic (congestive) heart failure: Secondary | ICD-10-CM | POA: Diagnosis present

## 2023-12-26 DIAGNOSIS — Z8249 Family history of ischemic heart disease and other diseases of the circulatory system: Secondary | ICD-10-CM | POA: Diagnosis not present

## 2023-12-26 DIAGNOSIS — E785 Hyperlipidemia, unspecified: Secondary | ICD-10-CM | POA: Diagnosis present

## 2023-12-26 DIAGNOSIS — E0500 Thyrotoxicosis with diffuse goiter without thyrotoxic crisis or storm: Secondary | ICD-10-CM | POA: Diagnosis not present

## 2023-12-26 DIAGNOSIS — Z91148 Patient's other noncompliance with medication regimen for other reason: Secondary | ICD-10-CM

## 2023-12-26 DIAGNOSIS — I739 Peripheral vascular disease, unspecified: Secondary | ICD-10-CM | POA: Diagnosis present

## 2023-12-26 DIAGNOSIS — I4891 Unspecified atrial fibrillation: Secondary | ICD-10-CM | POA: Diagnosis present

## 2023-12-26 DIAGNOSIS — I340 Nonrheumatic mitral (valve) insufficiency: Secondary | ICD-10-CM | POA: Diagnosis present

## 2023-12-26 DIAGNOSIS — I5020 Unspecified systolic (congestive) heart failure: Secondary | ICD-10-CM | POA: Diagnosis not present

## 2023-12-26 DIAGNOSIS — R918 Other nonspecific abnormal finding of lung field: Secondary | ICD-10-CM | POA: Diagnosis not present

## 2023-12-26 DIAGNOSIS — Z79899 Other long term (current) drug therapy: Secondary | ICD-10-CM | POA: Diagnosis not present

## 2023-12-26 DIAGNOSIS — Z7901 Long term (current) use of anticoagulants: Secondary | ICD-10-CM

## 2023-12-26 DIAGNOSIS — I428 Other cardiomyopathies: Secondary | ICD-10-CM | POA: Diagnosis not present

## 2023-12-26 DIAGNOSIS — I517 Cardiomegaly: Secondary | ICD-10-CM | POA: Diagnosis not present

## 2023-12-26 DIAGNOSIS — N170 Acute kidney failure with tubular necrosis: Secondary | ICD-10-CM | POA: Diagnosis present

## 2023-12-26 DIAGNOSIS — I4819 Other persistent atrial fibrillation: Secondary | ICD-10-CM | POA: Diagnosis not present

## 2023-12-26 DIAGNOSIS — Z8546 Personal history of malignant neoplasm of prostate: Secondary | ICD-10-CM | POA: Diagnosis not present

## 2023-12-26 DIAGNOSIS — M1990 Unspecified osteoarthritis, unspecified site: Secondary | ICD-10-CM | POA: Diagnosis present

## 2023-12-26 DIAGNOSIS — I700 Atherosclerosis of aorta: Secondary | ICD-10-CM | POA: Diagnosis not present

## 2023-12-26 DIAGNOSIS — Z923 Personal history of irradiation: Secondary | ICD-10-CM | POA: Diagnosis not present

## 2023-12-26 DIAGNOSIS — Z720 Tobacco use: Secondary | ICD-10-CM | POA: Diagnosis present

## 2023-12-26 DIAGNOSIS — I429 Cardiomyopathy, unspecified: Secondary | ICD-10-CM

## 2023-12-26 DIAGNOSIS — R911 Solitary pulmonary nodule: Secondary | ICD-10-CM | POA: Diagnosis not present

## 2023-12-26 DIAGNOSIS — Z452 Encounter for adjustment and management of vascular access device: Secondary | ICD-10-CM | POA: Diagnosis not present

## 2023-12-26 DIAGNOSIS — Z7984 Long term (current) use of oral hypoglycemic drugs: Secondary | ICD-10-CM

## 2023-12-26 DIAGNOSIS — R042 Hemoptysis: Secondary | ICD-10-CM | POA: Diagnosis not present

## 2023-12-26 LAB — COMPREHENSIVE METABOLIC PANEL WITH GFR
ALT: 74 U/L — ABNORMAL HIGH (ref 0–44)
AST: 60 U/L — ABNORMAL HIGH (ref 15–41)
Albumin: 2.9 g/dL — ABNORMAL LOW (ref 3.5–5.0)
Alkaline Phosphatase: 52 U/L (ref 38–126)
Anion gap: 12 (ref 5–15)
BUN: 26 mg/dL — ABNORMAL HIGH (ref 8–23)
CO2: 26 mmol/L (ref 22–32)
Calcium: 8.3 mg/dL — ABNORMAL LOW (ref 8.9–10.3)
Chloride: 101 mmol/L (ref 98–111)
Creatinine, Ser: 1.42 mg/dL — ABNORMAL HIGH (ref 0.61–1.24)
GFR, Estimated: 54 mL/min — ABNORMAL LOW (ref >60)
Glucose, Bld: 132 mg/dL — ABNORMAL HIGH (ref 70–99)
Potassium: 4.1 mmol/L (ref 3.5–5.1)
Sodium: 139 mmol/L (ref 135–145)
Total Bilirubin: 1.3 mg/dL — ABNORMAL HIGH (ref 0.0–1.2)
Total Protein: 5.9 g/dL — ABNORMAL LOW (ref 6.5–8.1)

## 2023-12-26 LAB — COOXEMETRY PANEL
Carboxyhemoglobin: 1.7 % — ABNORMAL HIGH (ref 0.5–1.5)
Methemoglobin: 0.7 % (ref 0.0–1.5)
O2 Saturation: 74.4 %
Total hemoglobin: 13.5 g/dL (ref 12.0–16.0)

## 2023-12-26 LAB — POCT I-STAT 7, (LYTES, BLD GAS, ICA,H+H)
Acid-Base Excess: 1 mmol/L (ref 0.0–2.0)
Bicarbonate: 26.5 mmol/L (ref 20.0–28.0)
Calcium, Ion: 1.16 mmol/L (ref 1.15–1.40)
HCT: 38 % — ABNORMAL LOW (ref 39.0–52.0)
Hemoglobin: 12.9 g/dL — ABNORMAL LOW (ref 13.0–17.0)
O2 Saturation: 93 %
Patient temperature: 98.7
Potassium: 4.2 mmol/L (ref 3.5–5.1)
Sodium: 139 mmol/L (ref 135–145)
TCO2: 28 mmol/L (ref 22–32)
pCO2 arterial: 44.3 mmHg (ref 32–48)
pH, Arterial: 7.385 (ref 7.35–7.45)
pO2, Arterial: 69 mmHg — ABNORMAL LOW (ref 83–108)

## 2023-12-26 LAB — GLUCOSE, CAPILLARY: Glucose-Capillary: 136 mg/dL — ABNORMAL HIGH (ref 70–99)

## 2023-12-26 LAB — HEPARIN LEVEL (UNFRACTIONATED): Heparin Unfractionated: >1.1 [IU]/mL — ABNORMAL HIGH (ref 0.30–0.70)

## 2023-12-26 LAB — DIGOXIN LEVEL: Digoxin Level: 0.9 ng/mL (ref 0.8–2.0)

## 2023-12-26 LAB — APTT
aPTT: 48 s — ABNORMAL HIGH (ref 24–36)
aPTT: 84 s — ABNORMAL HIGH (ref 24–36)

## 2023-12-26 LAB — PROTIME-INR
INR: 1.5 — ABNORMAL HIGH (ref 0.8–1.2)
Prothrombin Time: 19.4 s — ABNORMAL HIGH (ref 11.4–15.2)

## 2023-12-26 LAB — MAGNESIUM: Magnesium: 2.2 mg/dL (ref 1.7–2.4)

## 2023-12-26 MED ORDER — METHIMAZOLE 5 MG PO TABS
15.0000 mg | ORAL_TABLET | Freq: Every day | ORAL | Status: DC
  Administered 2023-12-26 – 2024-01-01 (×7): 15 mg via ORAL
  Filled 2023-12-26 (×7): qty 1

## 2023-12-26 MED ORDER — ORAL CARE MOUTH RINSE: 15.0000 mL | OROMUCOSAL | Status: DC | PRN

## 2023-12-26 MED ORDER — FUROSEMIDE 10 MG/ML IJ SOLN
40.0000 mg | Freq: Once | INTRAMUSCULAR | Status: AC
  Administered 2023-12-26: 40 mg via INTRAVENOUS
  Filled 2023-12-26: qty 4

## 2023-12-26 MED ORDER — BUDESONIDE 0.25 MG/2ML IN SUSP
0.2500 mg | Freq: Two times a day (BID) | RESPIRATORY_TRACT | Status: DC
  Administered 2023-12-26 – 2024-01-01 (×12): 0.25 mg via RESPIRATORY_TRACT
  Filled 2023-12-26 (×13): qty 2

## 2023-12-26 MED ORDER — AMIODARONE HCL IN DEXTROSE 360-4.14 MG/200ML-% IV SOLN
60.0000 mg/h | INTRAVENOUS | Status: DC
Start: 2023-12-26 — End: 2023-12-31
  Administered 2023-12-26 – 2023-12-27 (×5): 30 mg/h via INTRAVENOUS
  Administered 2023-12-28 – 2023-12-31 (×11): 60 mg/h via INTRAVENOUS
  Filled 2023-12-26 (×12): qty 200

## 2023-12-26 MED ORDER — SODIUM CHLORIDE 0.9% FLUSH
3.0000 mL | Freq: Two times a day (BID) | INTRAVENOUS | Status: DC
  Administered 2023-12-26 – 2024-01-01 (×12): 3 mL via INTRAVENOUS

## 2023-12-26 MED ORDER — SODIUM CHLORIDE 0.9% FLUSH: 3.0000 mL | INTRAVENOUS | Status: DC | PRN

## 2023-12-26 MED ORDER — AMIODARONE HCL IN DEXTROSE 360-4.14 MG/200ML-% IV SOLN
60.0000 mg/h | INTRAVENOUS | Status: AC
  Administered 2023-12-26 (×3): 60 mg/h via INTRAVENOUS
  Filled 2023-12-26 (×2): qty 200

## 2023-12-26 MED ORDER — ACETAMINOPHEN 325 MG PO TABS
650.0000 mg | ORAL_TABLET | ORAL | Status: DC | PRN
  Administered 2023-12-26 – 2023-12-27 (×2): 650 mg via ORAL
  Filled 2023-12-26 (×2): qty 2

## 2023-12-26 MED ORDER — MILRINONE LACTATE IN DEXTROSE 20-5 MG/100ML-% IV SOLN
0.1250 ug/kg/min | INTRAVENOUS | Status: DC
  Administered 2023-12-26: 0.125 ug/kg/min via INTRAVENOUS
  Filled 2023-12-26: qty 100

## 2023-12-26 MED ORDER — ATORVASTATIN CALCIUM 10 MG PO TABS
20.0000 mg | ORAL_TABLET | Freq: Every day | ORAL | Status: DC
  Administered 2023-12-26 – 2024-01-01 (×6): 20 mg via ORAL
  Filled 2023-12-26 (×6): qty 2

## 2023-12-26 MED ORDER — FUROSEMIDE 10 MG/ML IJ SOLN: 80.0000 mg | Freq: Once | INTRAMUSCULAR | Status: DC

## 2023-12-26 MED ORDER — NOREPINEPHRINE 4 MG/250ML-% IV SOLN
0.0000 ug/min | INTRAVENOUS | Status: DC
  Administered 2023-12-26: 3 ug/min via INTRAVENOUS

## 2023-12-26 MED ORDER — INSULIN ASPART 100 UNIT/ML IJ SOLN
0.0000 [IU] | Freq: Three times a day (TID) | INTRAMUSCULAR | Status: DC
  Administered 2023-12-28: 2 [IU] via SUBCUTANEOUS
  Administered 2023-12-29 – 2024-01-01 (×3): 1 [IU] via SUBCUTANEOUS

## 2023-12-26 MED ORDER — SODIUM CHLORIDE 0.9 % IV SOLN: 250.0000 mL | INTRAVENOUS | Status: AC | PRN

## 2023-12-26 MED ORDER — HEPARIN (PORCINE) 25000 UT/250ML-% IV SOLN
1350.0000 [IU]/h | INTRAVENOUS | Status: DC
  Administered 2023-12-26: 1100 [IU]/h via INTRAVENOUS
  Administered 2023-12-26 – 2023-12-28 (×3): 1350 [IU]/h via INTRAVENOUS
  Filled 2023-12-26 (×4): qty 250

## 2023-12-26 MED ORDER — SPIRONOLACTONE 25 MG PO TABS
25.0000 mg | ORAL_TABLET | Freq: Every day | ORAL | Status: DC
  Administered 2023-12-26 – 2024-01-01 (×6): 25 mg via ORAL
  Filled 2023-12-26 (×6): qty 1

## 2023-12-26 MED ORDER — AMIODARONE LOAD VIA INFUSION
150.0000 mg | Freq: Once | INTRAVENOUS | Status: AC
  Administered 2023-12-26: 150 mg via INTRAVENOUS
  Filled 2023-12-26: qty 83.34

## 2023-12-26 NOTE — H&P (View-Only) (Signed)
 Advanced Heart Failure Rounding Note  Cardiologist: [facility] Consulting HF Cardiologist: Dr. Rolan  Chief Complaint: Cardiogenic shock  Subjective:    Currently on 0.25 milrinone . NE weaned off (on 2 mcg/kg/min at time of below measurements). Swan #s CVP 4 PA 37/14 CO 5.1 CI 2.53 SVR 1333 CO-OX 74%  Currently in AFL 90s-100s, brief episodes of SR this am. Loading with IV amiodarone .  Feeling much better. No dyspnea at rest.    Objective:   Weight Range: 76.5 kg Body mass index is 22.25 kg/m.   Vital Signs:   Temp:  [98.2 F (36.8 C)-98.7 F (37.1 C)] 98.2 F (36.8 C) (09/22 0630) Pulse Rate:  [71-139] 89 (09/22 0630) Resp:  [13-28] 18 (09/22 0630) BP: (60-134)/(38-115) 109/66 (09/22 0630) SpO2:  [92 %-100 %] 99 % (09/22 0630) Arterial Line BP: (6-313)/(3-307) 313/307 (09/22 0430) Weight:  [76.5 kg] 76.5 kg (09/22 0045)    Weight change: Filed Weights   12/26/23 0045  Weight: 76.5 kg    Intake/Output:  No intake or output data in the 24 hours ending 12/26/23 0721    Physical Exam    General:  Chronically ill appearing HEENT: edentulous, enlarged thyroid  Cor: R internal jugular Swan. Regular rate & rhythm. 3/6 holosystolic murmur at apex Lungs: breathing nonlaobred Abdomen: Soft, nontender, nondistended.  Extremities: No edema Neuro: Alert & orientedx3. Affect pleasant   Telemetry   AFL 90s-100s, brief periods of SR  Labs    CBC Recent Labs    12/24/23 1111 12/25/23 2153 12/25/23 2248 12/26/23 0209  WBC 5.7  --   --   --   HGB 13.9   < > 12.6* 12.9*  HCT 41.7   < > 37.0* 38.0*  MCV 95.6  --   --   --   PLT 150  --   --   --    < > = values in this interval not displayed.   Basic Metabolic Panel Recent Labs    90/78/74 0427 12/25/23 1312 12/25/23 2153 12/26/23 0201 12/26/23 0209  NA 137  --    < > 139 139  K 3.9  --    < > 4.1 4.2  CL 107  --   --  101  --   CO2 23  --   --  26  --   GLUCOSE 116*  --   --  132*  --   BUN 19   --   --  26*  --   CREATININE 1.24  --   --  1.42*  --   CALCIUM  8.4*  --   --  8.3*  --   MG  --  1.9  --  2.2  --    < > = values in this interval not displayed.   Liver Function Tests Recent Labs    12/26/23 0201  AST 60*  ALT 74*  ALKPHOS 52  BILITOT 1.3*  PROT 5.9*  ALBUMIN 2.9*   No results for input(s): LIPASE, AMYLASE in the last 72 hours. Cardiac Enzymes No results for input(s): CKTOTAL, CKMB, CKMBINDEX, TROPONINI in the last 72 hours.  BNP: BNP (last 3 results) Recent Labs    12/24/23 1111  BNP 818.6*    ProBNP (last 3 results) No results for input(s): PROBNP in the last 8760 hours.   D-Dimer Recent Labs    12/24/23 1111  DDIMER 0.43   Hemoglobin A1C Recent Labs    12/25/23 0427  HGBA1C 5.4   Fasting Lipid  Panel No results for input(s): CHOL, HDL, LDLCALC, TRIG, CHOLHDL, LDLDIRECT in the last 72 hours. Thyroid  Function Tests Recent Labs    12/24/23 1111  TSH <0.100*    Other results:   Imaging    DG CHEST PORT 1 VIEW Result Date: 12/26/2023 CLINICAL DATA:  [id number] with acute on chronic heart failure with reduced ejection fraction. EXAM: PORTABLE CHEST 1 VIEW COMPARISON:  Portable chest yesterday at 9:19 p.m. FINDINGS: 5:43 a.m. left IJ central line again terminates at the superior cavoatrial junction. A right IJ Swan-Ganz line has been added, terminating in the right lower lobe artery. Overlapping defibrillator pads superimposing over the lower left chest. Mild cardiomegaly. Perihilar vascular prominence right-greater-than-left is again noted with patchy right perihilar opacities, in keeping with edema versus pneumonia. No subpleural edema is seen and no substantial pleural effusion. The remaining lungs are clear. The mediastinum is stable. No new osseous findings. No pneumothorax. IMPRESSION: 1. Right IJ Swan-Ganz line terminates in the right lower lobe artery. 2. Stable left IJ central line. 3. Stable cardiomegaly and  perihilar vascular prominence. 4. Patchy right perihilar opacities, in keeping with edema versus pneumonia. Electronically Signed   By: Francis Quam M.D.   On: 12/26/2023 05:54   CARDIAC CATHETERIZATION Addendum Date: 12/25/2023 1.  Normal right dominant circulation with no obstructive coronary artery disease. 2.  Metabolic acidosis treated with 3 amps of bicarbonate with subsequent normalization of ABG. 3.  With the patient on milrinone  0.25 mcg/kilogram/minute and norepinephrine  6 mcg/min, the following hemodynamically was obtained;  Fick cardiac output of 3.2 L/min and Fick cardiac index of 1.6 L/min/m  Thermodilution cardiac output of 4.8 L/min/m with thermodilution cardiac index of 2.4 L/min/m   Right atrial pressure of mean of 12 with V waves to 15 mmHg   PA pressure of 64/40 with a mean of 37 mmHg   Wedge pressure mean of 34 mmHg with V waves to 56 mmHg   PVR of 0.92 by Fick and 0.62 by TD   PA pulsatility index of 2   CPO of 0.51 by Fick and 0.75 by TD 4.  LVEDP of 24 mmHg 5.  Successful left radial arterial line placement. 6.  Capacious iliofemoral vessels bilaterally Summary: Given improving lactate (2.7 > 1.4), ABG, and CPO > 0.6, mechanical circulatory support was not pursued.  Iliofemoral vessels are approachable if mechanical circulatory support is needed in the future.  The results were discussed with Dr. Sabarwal who will accept the patient in transfer.  Continue milrinone  0.25 mcg/kg/min, and norepinephrine  to oh MAP goal of 70 mmHg.  Trend co-oximetry and lactate.  Discussed with sister [name] Dixons by phone [phone])  Result Date: 12/25/2023 1.  Normal right dominant circulation with no obstructive coronary artery disease. 2.  Metabolic acidosis treated with 3 amps of bicarbonate with subsequent normalization of ABG. 3.  With the patient on milrinone  0.25 mcg/kilogram/minute and norepinephrine  6 mcg/min, the following hemodynamically was obtained;  Fick cardiac output of 3.2 L/min  and Fick cardiac index of 1.6 L/min/m  Thermodilution cardiac output of 4.8 L/min/m with thermodilution cardiac index of 2.4 L/min/m   Right atrial pressure of mean of 12 with V waves to 15 mmHg   PA pressure of 64/40 with a mean of 37 mmHg   Wedge pressure mean of 34 mmHg with V waves to 56 mmHg   PVR of 0.92 by Fick and 0.62 by TD   PA pulsatility index of 2   CPO of 0.51 by Fick and  0.75 by TD 4.  LVEDP of 24 mmHg 5.  Capacious iliofemoral vessels bilaterally Summary: Given improving lactate (2.7 > 1.4), ABG, and CPO > 0.6, mechanical circulatory support was not pursued.  Iliofemoral vessels are approachable if mechanical circulatory support is needed in the future.  The results were discussed with Dr. Sabarwal who will accept the patient in transfer.  Continue milrinone  0.25 mcg/kg/min, and norepinephrine  to oh MAP goal of 70 mmHg.  Trend co-oximetry and lactate.  Discussed with sister [name] Dixons by phone [phone])   DG Chest Port 1 View Result Date: 12/25/2023 CLINICAL DATA:  Central line placement. EXAM: PORTABLE CHEST 1 VIEW COMPARISON:  Chest x-ray 12/25/2023. FINDINGS: There is a new left-sided central venous catheter with distal tip in the SVC. There is no pneumothorax. There are patchy airspace opacities in the central right lung similar to prior. The heart is enlarged, unchanged. No pleural effusion. IMPRESSION: 1. New left-sided central venous catheter with distal tip in the SVC. No pneumothorax. 2. Patchy airspace opacities in the central right lung similar to prior. Electronically Signed   By: Greig Pique M.D.   On: 12/25/2023 21:30   ECHOCARDIOGRAM COMPLETE Result Date: 12/25/2023    ECHOCARDIOGRAM REPORT   Patient Name:   Tyler Cohen Date of Exam: 12/25/2023 Medical Rec #:  [id number]       Height:       73.0 in Accession #:    [id number]      Weight:       165.0 lb Date of Birth:  Aug 17, 1955       BSA:          1.983 m Patient Age:    68 years        BP:           107/80 mmHg  Patient Gender: M               HR:           119 bpm. Exam Location:  [facility] Procedure: 2D Echo, 3D Echo, Cardiac Doppler, Color Doppler and Strain Analysis            (Both Spectral and Color Flow Doppler were utilized during            procedure). Indications:     R07.89 Other chest pain; R07.9* Chest pain, unspecified  History:         Patient has prior history of Echocardiogram examinations, most                  recent 05/14/2021. Signs/Symptoms:Chest Pain.  Sonographer:     Doyal Point MHA, BS, RDCS Referring Phys:  [id number] CORT ONEIDA MANA Diagnosing Phys: Annalee Custovic IMPRESSIONS  1. Left ventricular ejection fraction, by estimation, is 20 to 25%. Left ventricular ejection fraction by PLAX is 21 %. The left ventricle has severely decreased function. The left ventricle demonstrates global hypokinesis. Left ventricular diastolic parameters are indeterminate.  2. Right ventricular systolic function is normal. The right ventricular size is normal.  3. Left atrial size was severely dilated.  4. The mitral valve is abnormal. Severe mitral valve regurgitation. No evidence of mitral stenosis.  5. Tricuspid valve regurgitation is mild to moderate.  6. The aortic valve is normal in structure. Aortic valve regurgitation is trivial. No aortic stenosis is present.  7. The inferior vena cava is normal in size with greater than 50% respiratory variability, suggesting right atrial pressure of 3 mmHg. FINDINGS  Left Ventricle: Left  ventricular ejection fraction, by estimation, is 20 to 25%. Left ventricular ejection fraction by PLAX is 21 %. The left ventricle has severely decreased function. The left ventricle demonstrates global hypokinesis. The left ventricular internal cavity size was normal in size. There is no left ventricular hypertrophy. Left ventricular diastolic parameters are indeterminate. Right Ventricle: The right ventricular size is normal. No increase in right ventricular wall thickness. Right ventricular  systolic function is normal. Left Atrium: Left atrial size was severely dilated. Right Atrium: Right atrial size was normal in size. Pericardium: There is no evidence of pericardial effusion. Mitral Valve: The mitral valve is abnormal. Severe mitral valve regurgitation. No evidence of mitral valve stenosis. Tricuspid Valve: The tricuspid valve is normal in structure. Tricuspid valve regurgitation is mild to moderate. Aortic Valve: The aortic valve is normal in structure. Aortic valve regurgitation is trivial. No aortic stenosis is present. Aortic valve mean gradient measures 3.0 mmHg. Aortic valve peak gradient measures 5.0 mmHg. Aortic valve area, by VTI measures 2.27 cm. Pulmonic Valve: The pulmonic valve was normal in structure. Pulmonic valve regurgitation is mild. Aorta: The aortic root is normal in size and structure. Venous: The inferior vena cava is normal in size with greater than 50% respiratory variability, suggesting right atrial pressure of 3 mmHg. IAS/Shunts: No atrial level shunt detected by color flow Doppler.  LEFT VENTRICLE PLAX 2D LV EF:         Left ventricular ejection fraction by PLAX is 21 %. LVIDd:         6.00 cm LVIDs:         5.40 cm LV PW:         1.10 cm LV IVS:        0.90 cm LVOT diam:     2.30 cm LV SV:         39 LV SV Index:   20 LVOT Area:     4.15 cm  RIGHT VENTRICLE RV Basal diam:  4.25 cm RV Mid diam:    3.40 cm RV S prime:     14.90 cm/s TAPSE (M-mode): 1.7 cm LEFT ATRIUM              Index        RIGHT ATRIUM           Index LA diam:        3.80 cm  1.92 cm/m   RA Area:     20.40 cm LA Vol (A2C):   163.0 ml 82.21 ml/m  RA Volume:   65.10 ml  32.83 ml/m LA Vol (A4C):   147.0 ml 74.14 ml/m LA Biplane Vol: 161.0 ml 81.20 ml/m  AORTIC VALVE AV Area (Vmax):    2.47 cm AV Area (Vmean):   2.34 cm AV Area (VTI):     2.27 cm AV Vmax:           112.00 cm/s AV Vmean:          79.100 cm/s AV VTI:            0.171 m AV Peak Grad:      5.0 mmHg AV Mean Grad:      3.0 mmHg LVOT  Vmax:         66.60 cm/s LVOT Vmean:        44.500 cm/s LVOT VTI:          0.094 m LVOT/AV VTI ratio: 0.55  AORTA Ao Root diam: 3.90 cm MR Peak grad:  124.5 mmHg MR Mean grad:    83.0 mmHg    SHUNTS MR Vmax:         558.00 cm/s  Systemic VTI:  0.09 m MR Vmean:        431.0 cm/s   Systemic Diam: 2.30 cm MR PISA:         7.60 cm MR PISA Eff ROA: 52 mm MR PISA Radius:  1.10 cm Designer, multimedia signed by Annalee Casa Signature Date/Time: 12/25/2023/9:42:18 AM    Final      Medications:     Scheduled Medications:  atorvastatin   20 mg Oral Daily   budesonide  (PULMICORT ) nebulizer solution  0.25 mg Nebulization BID   methimazole   15 mg Oral Daily   sodium chloride  flush  3 mL Intravenous Q12H    Infusions:  sodium chloride      amiodarone  60 mg/hr (12/26/23 0559)   Followed by   amiodarone      heparin  1,100 Units/hr (12/26/23 0309)   milrinone  0.25 mcg/kg/min (12/26/23 0310)   norepinephrine  (LEVOPHED ) Adult infusion 3 mcg/min (12/26/23 0310)    PRN Medications: sodium chloride , acetaminophen , sodium chloride  flush    Patient Profile   68 y.o. male with history of HFrEF/NICM w/ EF 20%, PAF, severe MR, hyperthyroidism and thyroid  nodule s/p I-131 ablation, prostate cancer s/p radiation therapy, hx left 2nd toe amputation 02/25 2/2 osteomyelitis.   Had recent viral syndrome. He was admitted to [facility] on 12/24/23 w/ AFL with RVR. Developed hypotension and cardiogenic shock after attempts to control rate with IV diltiazem  and IV metoprolol . Transferred to [facility] 12/26/23 for further management.  Assessment/Plan   Acute on chronic HFrEF >> Cardiogenic shock -EF has historically been in 20-25% range -Now presenting with cardiogenic shock in setting of Afib with RVR. Became hypotensive require pressor support after receiving IV diltiazem  and IV metoprolol . To complicate things, has severe MR -[facility] 12/25/23 on 0.25 milrinone  + 6 NE: No CAD, RA mean 12, PA 64/40, PCWP mean 34 w/  v waves to 56, Fick CI 1.6, TD CI 2.4, CPO 0.51 by Fick and 0.75 by TD, LVEDP 24 -Lactic acid 2.7>1.6 -Echo this admit EF 20-25%, RV okay, severe LAE, severe MR -TD CI 2.5 and CO-OX 74% on 0.25 milrinone . Wean to 0.125. Recheck hemodynamics in a few hours. - CVP 4 this am. PA pressure has significantly improved from last night. Will hold off on further diuresis. SVR should improve off NE.  -Loaded with digoxin  at OSH, dig level 3.3 (checked within 4-6 hrs after dose). Will need to reassess this admit. -Start spiro 25 mg daily -No beta blocker with shock  2. Atrial fibrillation/flutter with RVR -Admit with AFL with RVR in setting of hyperthyroidism -Currently AFL 90s-100s, continue amiodarone  gtt. In and out of SR.  -Would avoid IV beta blocker and IV diltiazem  in the future given the severity of his heart failure.  -Heparin  gtt  3. Severe MR -Likely functional -Reassess with TTE vs ?TEE once optimized  4. Hyperthyroidism -hx I-131 ablation  -On methimazole  (unclear what dose he was taking prior to admission). Has been started on 15 mg methimazole  daily here. -TSH < 0.1, free T4 2.3 - Sees endocrine at [facility]  5. AKI  -Scr baseline 1, up to 1.4 today  -Likely ATN from shock -Continue to monitor  CRITICAL CARE Performed by: COLLETTA MANUELITA SAILOR   Total critical care time: 20 minutes  Critical care time was exclusive of separately billable procedures and treating other patients.  Critical  care was necessary to treat or prevent imminent or life-threatening deterioration.  Critical care was time spent personally by me on the following activities: development of treatment plan with patient and/or surrogate as well as nursing, discussions with consultants, evaluation of patient's response to treatment, examination of patient, obtaining history from patient or surrogate, ordering and performing treatments and interventions, ordering and review of laboratory studies, ordering and review of  radiographic studies, pulse oximetry and re-evaluation of patient's condition.   Length of Stay: 0  FINCH, LINDSAY N, PA-C  12/26/2023, 7:21 AM  Advanced Heart Failure Team Pager [phone] (M-F; 7a - 5p)  Please contact [facility] for night-coverage after hours (5p -7a ) and weekends on [email or web address]  Patient seen with PA, I formulated the plan and agree with the above note.   He is off NE today, on milrinone  0.25 with co-ox 74% and CVP 7 on my read.  He remains in atrial fibrillation rate 100s, now on amiodarone  gtt 30 mg/hr.    Feels better today, denies dyspnea. BP stable.   General: NAD Neck: No JVD, no thyromegaly or thyroid  nodule.  Lungs: Clear to auscultation bilaterally with normal respiratory effort. CV: Nondisplaced PMI.  Heart irregular S1/S2, no S3/S4, 3/6 HSM apex.  No peripheral edema.  Abdomen: Soft, nontender, no hepatosplenomegaly, no distention.  Skin: Intact without lesions or rashes.  Neurologic: Alert and oriented x 3.  Psych: Normal affect. Extremities: No clubbing or cyanosis.  HEENT: Normal.    Chronic nonischemic cardiomyopathy.  Admitted with AFL/RVR, volume overload + cardiogenic shock.  Echo this admission with EF 20-25%, normal RV, severe MR.  LHC/RHC yesterday with no CAD, elevated filling pressures, adequate CI by thermodilution in setting of NE 6 and milrinone  0.25. Now NE has been weaned off, he is on milrinone  0.25.  CVP 7, co-ox 74%.  Feeling better overall, rate is controlled in 100s in atrial flutter.  - Decrease milrinone  to 0.125  - Lasix  40 mg IV x 1.  - No digoxin  yet, was loaded at [facility] and level high.  - Would like to get him back into NSR, see below.   He is in rate-controlled AFL today rate 100s.   - Amiodarone  not ideal with hyperthyroidism but need for control.  Continue gtt for now.  - Heparin  gtt - I will arrange for TEE-guided DCCV tomorrow if he remains in AFL.   Chronic hyperthyroidism in setting of multnodular goiter, h/o  I-131 ablation.  He is now on methimazole  15 mg daily.  We are not sure what his home dose was.   Severe MR noted on echo, will plan to assess by TEE tomorrow, ?mTEER candidate.   CRITICAL CARE Performed by: [name]  Total critical care time: 40 minutes  Critical care time was exclusive of separately billable procedures and treating other patients.  Critical care was necessary to treat or prevent imminent or life-threatening deterioration.  Critical care was time spent personally by me on the following activities: development of treatment plan with patient and/or surrogate as well as nursing, discussions with consultants, evaluation of patient's response to treatment, examination of patient, obtaining history from patient or surrogate, ordering and performing treatments and interventions, ordering and review of laboratory studies, ordering and review of radiographic studies, pulse oximetry and re-evaluation of patient's condition.  [name] 12/26/2023 12:06 PM

## 2023-12-26 NOTE — Plan of Care (Signed)
  Problem: Education: Goal: Knowledge of General Education information will improve Description: Including pain rating scale, medication(s)/side effects and non-pharmacologic comfort measures Outcome: Progressing   Problem: Health Behavior/Discharge Planning: Goal: Ability to manage health-related needs will improve Outcome: Progressing   Problem: Clinical Measurements: Goal: Ability to maintain clinical measurements within normal limits will improve Outcome: Progressing Goal: Will remain free from infection Outcome: Progressing Goal: Diagnostic test results will improve Outcome: Progressing   Problem: Coping: Goal: Level of anxiety will decrease Outcome: Progressing   Problem: Elimination: Goal: Will not experience complications related to urinary retention Outcome: Progressing   Problem: Pain Managment: Goal: General experience of comfort will improve and/or be controlled Outcome: Progressing   Problem: Safety: Goal: Ability to remain free from injury will improve Outcome: Progressing   Problem: Skin Integrity: Goal: Risk for impaired skin integrity will decrease Outcome: Progressing

## 2023-12-26 NOTE — Progress Notes (Signed)
 Nursing called secondary to change in waveform on Swan-Ganz catheter.  Appeared to be pulled back or migrated back.  His waveform was an RV waveform pattern  I was able to successfully balloon up and advance into PA waveform and wedge.  Balloon down to PA waveform.  Nursing noted current centimeter marker on Swan.  Portable chest x-ray was obtained during advancement.  With balloon down, PA waveform is present.  Patient feels well.  No sustained ectopy.  Oneil Parchment, MD

## 2023-12-26 NOTE — Progress Notes (Signed)
 Heart Failure Navigator Progress Note  Assessed for Heart & Vascular TOC clinic readiness.  Patient does not meet criteria due to Advanced Heart Failure Team patient of Dr. Shirlee Latch.   Navigator will sign off at this time.    Rhae Hammock, BSN, Scientist, clinical (histocompatibility and immunogenetics) Only

## 2023-12-26 NOTE — Progress Notes (Addendum)
 Advanced Heart Failure Rounding Note  Cardiologist: KC Consulting HF Cardiologist: Dr. Rolan  Chief Complaint: Cardiogenic shock  Subjective:    Currently on 0.25 milrinone . NE weaned off (on 2 mcg/kg/min at time of below measurements). Swan #s CVP 4 PA 37/14 CO 5.1 CI 2.53 SVR 1333 CO-OX 74%  Currently in AFL 90s-100s, brief episodes of SR this am. Loading with IV amiodarone .  Feeling much better. No dyspnea at rest.    Objective:   Weight Range: 76.5 kg Body mass index is 22.25 kg/m.   Vital Signs:   Temp:  [98.2 F (36.8 C)-98.7 F (37.1 C)] 98.2 F (36.8 C) (09/22 0630) Pulse Rate:  [71-139] 89 (09/22 0630) Resp:  [13-28] 18 (09/22 0630) BP: (60-134)/(38-115) 109/66 (09/22 0630) SpO2:  [92 %-100 %] 99 % (09/22 0630) Arterial Line BP: (6-313)/(3-307) 313/307 (09/22 0430) Weight:  [76.5 kg] 76.5 kg (09/22 0045)    Weight change: Filed Weights   12/26/23 0045  Weight: 76.5 kg    Intake/Output:  No intake or output data in the 24 hours ending 12/26/23 0721    Physical Exam    General:  Chronically ill appearing HEENT: edentulous, enlarged thyroid  Cor: R internal jugular Swan. Regular rate & rhythm. 3/6 holosystolic murmur at apex Lungs: breathing nonlaobred Abdomen: Soft, nontender, nondistended.  Extremities: No edema Neuro: Alert & orientedx3. Affect pleasant   Telemetry   AFL 90s-100s, brief periods of SR  Labs    CBC Recent Labs    12/24/23 1111 12/25/23 2153 12/25/23 2248 12/26/23 0209  WBC 5.7  --   --   --   HGB 13.9   < > 12.6* 12.9*  HCT 41.7   < > 37.0* 38.0*  MCV 95.6  --   --   --   PLT 150  --   --   --    < > = values in this interval not displayed.   Basic Metabolic Panel Recent Labs    90/78/74 0427 12/25/23 1312 12/25/23 2153 12/26/23 0201 12/26/23 0209  NA 137  --    < > 139 139  K 3.9  --    < > 4.1 4.2  CL 107  --   --  101  --   CO2 23  --   --  26  --   GLUCOSE 116*  --   --  132*  --   BUN 19   --   --  26*  --   CREATININE 1.24  --   --  1.42*  --   CALCIUM  8.4*  --   --  8.3*  --   MG  --  1.9  --  2.2  --    < > = values in this interval not displayed.   Liver Function Tests Recent Labs    12/26/23 0201  AST 60*  ALT 74*  ALKPHOS 52  BILITOT 1.3*  PROT 5.9*  ALBUMIN 2.9*   No results for input(s): LIPASE, AMYLASE in the last 72 hours. Cardiac Enzymes No results for input(s): CKTOTAL, CKMB, CKMBINDEX, TROPONINI in the last 72 hours.  BNP: BNP (last 3 results) Recent Labs    12/24/23 1111  BNP 818.6*    ProBNP (last 3 results) No results for input(s): PROBNP in the last 8760 hours.   D-Dimer Recent Labs    12/24/23 1111  DDIMER 0.43   Hemoglobin A1C Recent Labs    12/25/23 0427  HGBA1C 5.4   Fasting Lipid  Panel No results for input(s): CHOL, HDL, LDLCALC, TRIG, CHOLHDL, LDLDIRECT in the last 72 hours. Thyroid  Function Tests Recent Labs    12/24/23 1111  TSH <0.100*    Other results:   Imaging    DG CHEST PORT 1 VIEW Result Date: 12/26/2023 CLINICAL DATA:  [id number] with acute on chronic heart failure with reduced ejection fraction. EXAM: PORTABLE CHEST 1 VIEW COMPARISON:  Portable chest yesterday at 9:19 p.m. FINDINGS: 5:43 a.m. left IJ central line again terminates at the superior cavoatrial junction. A right IJ Swan-Ganz line has been added, terminating in the right lower lobe artery. Overlapping defibrillator pads superimposing over the lower left chest. Mild cardiomegaly. Perihilar vascular prominence right-greater-than-left is again noted with patchy right perihilar opacities, in keeping with edema versus pneumonia. No subpleural edema is seen and no substantial pleural effusion. The remaining lungs are clear. The mediastinum is stable. No new osseous findings. No pneumothorax. IMPRESSION: 1. Right IJ Swan-Ganz line terminates in the right lower lobe artery. 2. Stable left IJ central line. 3. Stable cardiomegaly and  perihilar vascular prominence. 4. Patchy right perihilar opacities, in keeping with edema versus pneumonia. Electronically Signed   By: Francis Quam M.D.   On: 12/26/2023 05:54   CARDIAC CATHETERIZATION Addendum Date: 12/25/2023 1.  Normal right dominant circulation with no obstructive coronary artery disease. 2.  Metabolic acidosis treated with 3 amps of bicarbonate with subsequent normalization of ABG. 3.  With the patient on milrinone  0.25 mcg/kilogram/minute and norepinephrine  6 mcg/min, the following hemodynamically was obtained;  Fick cardiac output of 3.2 L/min and Fick cardiac index of 1.6 L/min/m  Thermodilution cardiac output of 4.8 L/min/m with thermodilution cardiac index of 2.4 L/min/m   Right atrial pressure of mean of 12 with V waves to 15 mmHg   PA pressure of 64/40 with a mean of 37 mmHg   Wedge pressure mean of 34 mmHg with V waves to 56 mmHg   PVR of 0.92 by Fick and 0.62 by TD   PA pulsatility index of 2   CPO of 0.51 by Fick and 0.75 by TD 4.  LVEDP of 24 mmHg 5.  Successful left radial arterial line placement. 6.  Capacious iliofemoral vessels bilaterally Summary: Given improving lactate (2.7 > 1.4), ABG, and CPO > 0.6, mechanical circulatory support was not pursued.  Iliofemoral vessels are approachable if mechanical circulatory support is needed in the future.  The results were discussed with Dr. Sabarwal who will accept the patient in transfer.  Continue milrinone  0.25 mcg/kg/min, and norepinephrine  to oh MAP goal of 70 mmHg.  Trend co-oximetry and lactate.  Discussed with sister [name] Dixons by phone [phone])  Result Date: 12/25/2023 1.  Normal right dominant circulation with no obstructive coronary artery disease. 2.  Metabolic acidosis treated with 3 amps of bicarbonate with subsequent normalization of ABG. 3.  With the patient on milrinone  0.25 mcg/kilogram/minute and norepinephrine  6 mcg/min, the following hemodynamically was obtained;  Fick cardiac output of 3.2 L/min  and Fick cardiac index of 1.6 L/min/m  Thermodilution cardiac output of 4.8 L/min/m with thermodilution cardiac index of 2.4 L/min/m   Right atrial pressure of mean of 12 with V waves to 15 mmHg   PA pressure of 64/40 with a mean of 37 mmHg   Wedge pressure mean of 34 mmHg with V waves to 56 mmHg   PVR of 0.92 by Fick and 0.62 by TD   PA pulsatility index of 2   CPO of 0.51 by Fick and  0.75 by TD 4.  LVEDP of 24 mmHg 5.  Capacious iliofemoral vessels bilaterally Summary: Given improving lactate (2.7 > 1.4), ABG, and CPO > 0.6, mechanical circulatory support was not pursued.  Iliofemoral vessels are approachable if mechanical circulatory support is needed in the future.  The results were discussed with Dr. Sabarwal who will accept the patient in transfer.  Continue milrinone  0.25 mcg/kg/min, and norepinephrine  to oh MAP goal of 70 mmHg.  Trend co-oximetry and lactate.  Discussed with sister [name] Dixons by phone [phone])   DG Chest Port 1 View Result Date: 12/25/2023 CLINICAL DATA:  Central line placement. EXAM: PORTABLE CHEST 1 VIEW COMPARISON:  Chest x-ray 12/25/2023. FINDINGS: There is a new left-sided central venous catheter with distal tip in the SVC. There is no pneumothorax. There are patchy airspace opacities in the central right lung similar to prior. The heart is enlarged, unchanged. No pleural effusion. IMPRESSION: 1. New left-sided central venous catheter with distal tip in the SVC. No pneumothorax. 2. Patchy airspace opacities in the central right lung similar to prior. Electronically Signed   By: Greig Pique M.D.   On: 12/25/2023 21:30   ECHOCARDIOGRAM COMPLETE Result Date: 12/25/2023    ECHOCARDIOGRAM REPORT   Patient Name:   Tyler Cohen Date of Exam: 12/25/2023 Medical Rec #:  [id number]       Height:       73.0 in Accession #:    [id number]      Weight:       165.0 lb Date of Birth:  Aug 17, 1955       BSA:          1.983 m Patient Age:    68 years        BP:           107/80 mmHg  Patient Gender: M               HR:           119 bpm. Exam Location:  [facility] Procedure: 2D Echo, 3D Echo, Cardiac Doppler, Color Doppler and Strain Analysis            (Both Spectral and Color Flow Doppler were utilized during            procedure). Indications:     R07.89 Other chest pain; R07.9* Chest pain, unspecified  History:         Patient has prior history of Echocardiogram examinations, most                  recent 05/14/2021. Signs/Symptoms:Chest Pain.  Sonographer:     Doyal Point MHA, BS, RDCS Referring Phys:  [id number] CORT ONEIDA MANA Diagnosing Phys: Annalee Custovic IMPRESSIONS  1. Left ventricular ejection fraction, by estimation, is 20 to 25%. Left ventricular ejection fraction by PLAX is 21 %. The left ventricle has severely decreased function. The left ventricle demonstrates global hypokinesis. Left ventricular diastolic parameters are indeterminate.  2. Right ventricular systolic function is normal. The right ventricular size is normal.  3. Left atrial size was severely dilated.  4. The mitral valve is abnormal. Severe mitral valve regurgitation. No evidence of mitral stenosis.  5. Tricuspid valve regurgitation is mild to moderate.  6. The aortic valve is normal in structure. Aortic valve regurgitation is trivial. No aortic stenosis is present.  7. The inferior vena cava is normal in size with greater than 50% respiratory variability, suggesting right atrial pressure of 3 mmHg. FINDINGS  Left Ventricle: Left  ventricular ejection fraction, by estimation, is 20 to 25%. Left ventricular ejection fraction by PLAX is 21 %. The left ventricle has severely decreased function. The left ventricle demonstrates global hypokinesis. The left ventricular internal cavity size was normal in size. There is no left ventricular hypertrophy. Left ventricular diastolic parameters are indeterminate. Right Ventricle: The right ventricular size is normal. No increase in right ventricular wall thickness. Right ventricular  systolic function is normal. Left Atrium: Left atrial size was severely dilated. Right Atrium: Right atrial size was normal in size. Pericardium: There is no evidence of pericardial effusion. Mitral Valve: The mitral valve is abnormal. Severe mitral valve regurgitation. No evidence of mitral valve stenosis. Tricuspid Valve: The tricuspid valve is normal in structure. Tricuspid valve regurgitation is mild to moderate. Aortic Valve: The aortic valve is normal in structure. Aortic valve regurgitation is trivial. No aortic stenosis is present. Aortic valve mean gradient measures 3.0 mmHg. Aortic valve peak gradient measures 5.0 mmHg. Aortic valve area, by VTI measures 2.27 cm. Pulmonic Valve: The pulmonic valve was normal in structure. Pulmonic valve regurgitation is mild. Aorta: The aortic root is normal in size and structure. Venous: The inferior vena cava is normal in size with greater than 50% respiratory variability, suggesting right atrial pressure of 3 mmHg. IAS/Shunts: No atrial level shunt detected by color flow Doppler.  LEFT VENTRICLE PLAX 2D LV EF:         Left ventricular ejection fraction by PLAX is 21 %. LVIDd:         6.00 cm LVIDs:         5.40 cm LV PW:         1.10 cm LV IVS:        0.90 cm LVOT diam:     2.30 cm LV SV:         39 LV SV Index:   20 LVOT Area:     4.15 cm  RIGHT VENTRICLE RV Basal diam:  4.25 cm RV Mid diam:    3.40 cm RV S prime:     14.90 cm/s TAPSE (M-mode): 1.7 cm LEFT ATRIUM              Index        RIGHT ATRIUM           Index LA diam:        3.80 cm  1.92 cm/m   RA Area:     20.40 cm LA Vol (A2C):   163.0 ml 82.21 ml/m  RA Volume:   65.10 ml  32.83 ml/m LA Vol (A4C):   147.0 ml 74.14 ml/m LA Biplane Vol: 161.0 ml 81.20 ml/m  AORTIC VALVE AV Area (Vmax):    2.47 cm AV Area (Vmean):   2.34 cm AV Area (VTI):     2.27 cm AV Vmax:           112.00 cm/s AV Vmean:          79.100 cm/s AV VTI:            0.171 m AV Peak Grad:      5.0 mmHg AV Mean Grad:      3.0 mmHg LVOT  Vmax:         66.60 cm/s LVOT Vmean:        44.500 cm/s LVOT VTI:          0.094 m LVOT/AV VTI ratio: 0.55  AORTA Ao Root diam: 3.90 cm MR Peak grad:  124.5 mmHg MR Mean grad:    83.0 mmHg    SHUNTS MR Vmax:         558.00 cm/s  Systemic VTI:  0.09 m MR Vmean:        431.0 cm/s   Systemic Diam: 2.30 cm MR PISA:         7.60 cm MR PISA Eff ROA: 52 mm MR PISA Radius:  1.10 cm Designer, multimedia signed by Annalee Casa Signature Date/Time: 12/25/2023/9:42:18 AM    Final      Medications:     Scheduled Medications:  atorvastatin   20 mg Oral Daily   budesonide  (PULMICORT ) nebulizer solution  0.25 mg Nebulization BID   methimazole   15 mg Oral Daily   sodium chloride  flush  3 mL Intravenous Q12H    Infusions:  sodium chloride      amiodarone  60 mg/hr (12/26/23 0559)   Followed by   amiodarone      heparin  1,100 Units/hr (12/26/23 0309)   milrinone  0.25 mcg/kg/min (12/26/23 0310)   norepinephrine  (LEVOPHED ) Adult infusion 3 mcg/min (12/26/23 0310)    PRN Medications: sodium chloride , acetaminophen , sodium chloride  flush    Patient Profile   68 y.o. male with history of HFrEF/NICM w/ EF 20%, PAF, severe MR, hyperthyroidism and thyroid  nodule s/p I-131 ablation, prostate cancer s/p radiation therapy, hx left 2nd toe amputation 02/25 2/2 osteomyelitis.   Had recent viral syndrome. He was admitted to [facility] on 12/24/23 w/ AFL with RVR. Developed hypotension and cardiogenic shock after attempts to control rate with IV diltiazem  and IV metoprolol . Transferred to [facility] 12/26/23 for further management.  Assessment/Plan   Acute on chronic HFrEF >> Cardiogenic shock -EF has historically been in 20-25% range -Now presenting with cardiogenic shock in setting of Afib with RVR. Became hypotensive require pressor support after receiving IV diltiazem  and IV metoprolol . To complicate things, has severe MR -[facility] 12/25/23 on 0.25 milrinone  + 6 NE: No CAD, RA mean 12, PA 64/40, PCWP mean 34 w/  v waves to 56, Fick CI 1.6, TD CI 2.4, CPO 0.51 by Fick and 0.75 by TD, LVEDP 24 -Lactic acid 2.7>1.6 -Echo this admit EF 20-25%, RV okay, severe LAE, severe MR -TD CI 2.5 and CO-OX 74% on 0.25 milrinone . Wean to 0.125. Recheck hemodynamics in a few hours. - CVP 4 this am. PA pressure has significantly improved from last night. Will hold off on further diuresis. SVR should improve off NE.  -Loaded with digoxin  at OSH, dig level 3.3 (checked within 4-6 hrs after dose). Will need to reassess this admit. -Start spiro 25 mg daily -No beta blocker with shock  2. Atrial fibrillation/flutter with RVR -Admit with AFL with RVR in setting of hyperthyroidism -Currently AFL 90s-100s, continue amiodarone  gtt. In and out of SR.  -Would avoid IV beta blocker and IV diltiazem  in the future given the severity of his heart failure.  -Heparin  gtt  3. Severe MR -Likely functional -Reassess with TTE vs ?TEE once optimized  4. Hyperthyroidism -hx I-131 ablation  -On methimazole  (unclear what dose he was taking prior to admission). Has been started on 15 mg methimazole  daily here. -TSH < 0.1, free T4 2.3 - Sees endocrine at [facility]  5. AKI  -Scr baseline 1, up to 1.4 today  -Likely ATN from shock -Continue to monitor  CRITICAL CARE Performed by: COLLETTA MANUELITA SAILOR   Total critical care time: 20 minutes  Critical care time was exclusive of separately billable procedures and treating other patients.  Critical  care was necessary to treat or prevent imminent or life-threatening deterioration.  Critical care was time spent personally by me on the following activities: development of treatment plan with patient and/or surrogate as well as nursing, discussions with consultants, evaluation of patient's response to treatment, examination of patient, obtaining history from patient or surrogate, ordering and performing treatments and interventions, ordering and review of laboratory studies, ordering and review of  radiographic studies, pulse oximetry and re-evaluation of patient's condition.   Length of Stay: 0  FINCH, LINDSAY N, PA-C  12/26/2023, 7:21 AM  Advanced Heart Failure Team Pager [phone] (M-F; 7a - 5p)  Please contact [facility] for night-coverage after hours (5p -7a ) and weekends on [email or web address]  Patient seen with PA, I formulated the plan and agree with the above note.   He is off NE today, on milrinone  0.25 with co-ox 74% and CVP 7 on my read.  He remains in atrial fibrillation rate 100s, now on amiodarone  gtt 30 mg/hr.    Feels better today, denies dyspnea. BP stable.   General: NAD Neck: No JVD, no thyromegaly or thyroid  nodule.  Lungs: Clear to auscultation bilaterally with normal respiratory effort. CV: Nondisplaced PMI.  Heart irregular S1/S2, no S3/S4, 3/6 HSM apex.  No peripheral edema.  Abdomen: Soft, nontender, no hepatosplenomegaly, no distention.  Skin: Intact without lesions or rashes.  Neurologic: Alert and oriented x 3.  Psych: Normal affect. Extremities: No clubbing or cyanosis.  HEENT: Normal.    Chronic nonischemic cardiomyopathy.  Admitted with AFL/RVR, volume overload + cardiogenic shock.  Echo this admission with EF 20-25%, normal RV, severe MR.  LHC/RHC yesterday with no CAD, elevated filling pressures, adequate CI by thermodilution in setting of NE 6 and milrinone  0.25. Now NE has been weaned off, he is on milrinone  0.25.  CVP 7, co-ox 74%.  Feeling better overall, rate is controlled in 100s in atrial flutter.  - Decrease milrinone  to 0.125  - Lasix  40 mg IV x 1.  - No digoxin  yet, was loaded at [facility] and level high.  - Would like to get him back into NSR, see below.   He is in rate-controlled AFL today rate 100s.   - Amiodarone  not ideal with hyperthyroidism but need for control.  Continue gtt for now.  - Heparin  gtt - I will arrange for TEE-guided DCCV tomorrow if he remains in AFL.   Chronic hyperthyroidism in setting of multnodular goiter, h/o  I-131 ablation.  He is now on methimazole  15 mg daily.  We are not sure what his home dose was.   Severe MR noted on echo, will plan to assess by TEE tomorrow, ?mTEER candidate.   CRITICAL CARE Performed by: [name]  Total critical care time: 40 minutes  Critical care time was exclusive of separately billable procedures and treating other patients.  Critical care was necessary to treat or prevent imminent or life-threatening deterioration.  Critical care was time spent personally by me on the following activities: development of treatment plan with patient and/or surrogate as well as nursing, discussions with consultants, evaluation of patient's response to treatment, examination of patient, obtaining history from patient or surrogate, ordering and performing treatments and interventions, ordering and review of laboratory studies, ordering and review of radiographic studies, pulse oximetry and re-evaluation of patient's condition.  [name] 12/26/2023 12:06 PM

## 2023-12-26 NOTE — Plan of Care (Signed)
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
## 2023-12-26 NOTE — Progress Notes (Signed)
 PHARMACY - ANTICOAGULATION CONSULT NOTE  Pharmacy Consult for heparin  Indication: atrial fibrillation  Allergies  Allergen Reactions   Lactose Intolerance (Gi) Diarrhea    Patient Measurements: Weight: 76.5 kg (168 lb 10.4 oz)  Vital Signs: BP: 113/82 (09/21 2331) Pulse Rate: 86 (09/21 2331)  Labs: Recent Labs    12/24/23 1111 12/24/23 1324 12/25/23 0427 12/25/23 1958 12/25/23 2027 12/25/23 2153 12/25/23 2213 12/25/23 2217 12/25/23 2248  HGB 13.9  --   --   --   --    < > 13.3 13.3 12.6*  HCT 41.7  --   --   --   --    < > 39.0 39.0 37.0*  PLT 150  --   --   --   --   --   --   --   --   APTT  --   --   --   --  31  --   --   --   --   CREATININE 1.12  --  1.24  --   --   --   --   --   --   TROPONINIHS 20* 20*  --  13  --   --   --   --   --    < > = values in this interval not displayed.    Estimated Creatinine Clearance: 61.7 mL/min (by C-G formula based on SCr of 1.24 mg/dL).   Medical History: Past Medical History:  Diagnosis Date   Adrenal mass (HCC) 2019   Aortic atherosclerosis (HCC)    Arthritis    Atrial fibrillation (HCC)    a.) CHA2DS2VASc = 4 (age, CHF, HTN, vascular disease history);  b.) s/p DCCV (300 J x 1) 05/18/2021; c.) rate/rhythm maintained on oral digoxin ; chronically anticoagulated with apixaban    Benign neoplasm of sigmoid colon    BPH (benign prostatic hyperplasia)    Dental disease    Digital mucinous cyst of finger of right hand 2018   Elevated PSA    Heart murmur    Hepatic cyst    Hepatic hemangioma    HFrEF (heart failure with reduced ejection fraction) (HCC)    a.) TTE 05/14/2021: EF 25-30%, glob HK, mod dil LV, RVE, PASP 39.8; b.) TTE 06/25/2021: EF 30%, LAE, triv AR/PR, mild TR, mod MR, G1DD   HLD (hyperlipidemia)    HTN (hypertension)    Hyperthyroidism 2019   Left thyroid  nodule 2019   Liver lesion 2018   Long term current use of antiarrhythmic drug    a.) digoxin    Macrocytosis    Malnutrition of moderate degree  (HCC) 05/15/2021   Marijuana use    Multinodular goiter    NICM (nonischemic cardiomyopathy) (HCC)    a.) TTE 05/14/2021: EF 25-30%; b.) TTE 06/25/2021: EF 30%   Non compliance w medication regimen    On apixaban  therapy    Osteomyelitis of great toe (HCC) 05/03/2023   Peripheral vascular disease (HCC)    Psoriasis 2017   Renal mass 2018   Right lower lobe pulmonary nodule 08/20/2021   a.) cCTA 08/20/2021: measured 7 x 5 mm.   Thyroid  crisis or storm 05/13/2021   Tobacco abuse     Medications:  Medications Prior to Admission  Medication Sig Dispense Refill Last Dose/Taking   albuterol  (VENTOLIN  HFA) 108 (90 Base) MCG/ACT inhaler INHALE 2 PUFFS INTO THE LUNGS EVERY 6 HOURS AS NEEDED FOR WHEEZING OR SHORTNESS OF BREATH 8.5 g 3    apixaban  (ELIQUIS )  5 MG TABS tablet TAKE 1 TABLET BY MOUTH TWICE DAILY 180 tablet 3    atorvastatin  (LIPITOR) 20 MG tablet TAKE 1 TABLET BY MOUTH AT BEDTIME (Patient not taking: Reported on 12/24/2023) 90 tablet 2    digoxin  (LANOXIN ) 0.125 MG tablet Take 1 tablet (125 mcg total) by mouth daily.      empagliflozin  (JARDIANCE ) 10 MG TABS tablet Take 1 tablet (10 mg total) by mouth daily. 90 tablet 1    furosemide  (LASIX ) 40 MG tablet Take 1 tablet (40 mg total) by mouth daily.      losartan  (COZAAR ) 25 MG tablet Take 1 tablet (25 mg total) by mouth daily. 90 tablet 1    methimazole  (TAPAZOLE ) 10 MG tablet Take 1 tablet (10 mg total) by mouth See admin instructions. Take with 5 mg for a total of 15 mg daily (Patient not taking: Reported on 12/24/2023)      methimazole  (TAPAZOLE ) 5 MG tablet Take 5 mg by mouth See admin instructions. Take with 10 mg for a total of 15 mg daily      spironolactone  (ALDACTONE ) 25 MG tablet Take 0.5 tablets (12.5 mg total) by mouth daily. 45 tablet 1    tamsulosin  (FLOMAX ) 0.4 MG CAPS capsule Take 1 capsule (0.4 mg total) by mouth daily. (Patient not taking: Reported on 12/24/2023) 90 capsule 1     Assessment: Tyler Cohen. is a  68 y.o. male with medical history including PAF on Eliquis . Patient presented with chest pain and SOB. Patient experiencing hypotension and possibly developing shock. Pharmacy has been consulted to transition patient to heparin .  Last dose of Eliquis  was given 9/21 @ 0943 Hgb ~13, Plt 150.  Baseline aPTT 31  Goal of Therapy:  Heparin  level 0.3-0.7 units/ml aPTT 66-102 seconds Monitor platelets by anticoagulation protocol: Yes   Plan:  Start heparin  infusion at 1100 units/hr Check anti-Xa level and aPTT in 6-8 hours and daily while on heparin  Continue to monitor H&H and platelets   Thank you for allowing pharmacy to be a part of this patient's care.  [name], PharmD 12/26/2023 1:24 AM  **Pharmacist phone directory can be found on [email or web address] listed under [facility]**

## 2023-12-26 NOTE — Progress Notes (Signed)
 eLink Physician-Brief Progress Note Patient Name: Tyler Cohen. DOB: 1956/01/23 MRN: [id number]   Date of Service  12/26/2023  HPI/Events of Note  83 M hx of HFrEF 20%, PAD on apixaban , hyperthyroidism on methimazole , prostate CA s/p radiation. Presented at [facility] on 9/20 with chest pain and palpitations with few days of URI symptoms. He was found to be in cardiogenic shock, afib RVR. Taken to cath lab for [facility] and possible MCS. Started on milrinone  and given Lasix  as with note of elevated filling pressures on RHC. Also started on amiodarone  and heparin  drip.  eICU Interventions  Please inform critical care team if formal consultation will be needed.     Intervention Category Evaluation Type: New Patient Evaluation  Tyler Cohen 12/26/2023, 2:27 AM

## 2023-12-26 NOTE — Progress Notes (Signed)
 PHARMACY - ANTICOAGULATION CONSULT NOTE  Pharmacy Consult for heparin  Indication: atrial fibrillation  Allergies  Allergen Reactions   Lactose Intolerance (Gi) Diarrhea and Other (See Comments)    Bloating, flatulence    Patient Measurements: Weight: 76.5 kg (168 lb 10.4 oz)  Vital Signs: Temp: 98.6 F (37 C) (09/22 1630) Temp Source: Core (09/22 1600) BP: 104/81 (09/22 1630) Pulse Rate: 117 (09/22 1630)  Labs: Recent Labs    12/24/23 1111 12/24/23 1324 12/25/23 0427 12/25/23 1958 12/25/23 2027 12/25/23 2153 12/25/23 2217 12/25/23 2248 12/26/23 0201 12/26/23 0209 12/26/23 0900 12/26/23 1830  HGB 13.9  --   --   --   --    < > 13.3 12.6*  --  12.9*  --   --   HCT 41.7  --   --   --   --    < > 39.0 37.0*  --  38.0*  --   --   PLT 150  --   --   --   --   --   --   --   --   --   --   --   APTT  --   --   --   --  31  --   --   --   --   --  48* 84*  LABPROT  --   --   --   --   --   --   --   --  19.4*  --   --   --   INR  --   --   --   --   --   --   --   --  1.5*  --   --   --   HEPARINUNFRC  --   --   --   --   --   --   --   --   --   --  >1.10*  --   CREATININE 1.12  --  1.24  --   --   --   --   --  1.42*  --   --   --   TROPONINIHS 20* 20*  --  13  --   --   --   --   --   --   --   --    < > = values in this interval not displayed.    Estimated Creatinine Clearance: 53.9 mL/min (A) (by C-G formula based on SCr of 1.42 mg/dL (H)).  Assessment: Tyler Cohen. is a 68 y.o. male with medical history including PAF on Eliquis . Patient presented with chest pain and SOB. Patient experiencing hypotension and possibly developing shock. Pharmacy has been consulted to transition patient to heparin .  aPTT therapeutic (84 sec) on infusion at 1350 units/hr. No bleeding noted.  Goal of Therapy:  Heparin  level 0.3-0.7 units/ml aPTT 66-102 seconds Monitor platelets by anticoagulation protocol: Yes   Plan:  Continue heparin  at 1350 units/h F/u a.m. aPTT to  confirm therapeutic  Vito Ralph, PharmD, BCPS Please see amion for complete clinical pharmacist phone list 12/26/2023 7:27 PM

## 2023-12-26 NOTE — H&P (Signed)
 Cardiology Admission History and Physical   Patient ID: Tyler Cohen. MRN: [id number]; DOB: [birth year]   Admission date: 12/26/2023  PCP:  Vicci Duwaine SQUIBB, DO   [facility] Providers Cardiologist:  None   Click here to update MD or APP on Care Team, Refresh:1}    Chief Complaint:  cardiogenic shock  Patient Profile: Tyler Cohen. is a 68 y.o. male with non-ischemic HFrEF with LVEF of 20%, pA on apixaban , hyperthyrodism on methimazole , thyroid  nodule s/p I-131 ablation, prostate cancer s/p radiation therapy who was admitted to [facility] on 9/20 for cardiogenic shock with AF with RVR. He was trasnferred on 9/21 to [facility] a higher level of care.    History of Present Illness: Tyler Cohen presented on 9/20 to [facility] with chest pain and palpitations followed a day days of URI-like symptoms (runny nose, sore throat, and dry cough). He was found to be in AF with RVR; he was started on amiodarone  gtt and subsequently started on sotalol  (only received one dose on 9/21). He then developed hypotension with increased lactate to 2.7 in the setting of chest pain. Troponin during admission was 20, 20, 13). He was taken to the cath lab emergently for [facility] +/- MCS. Coronary angiogram showed no obstructive disease. Fick's CI was 1.6, while TD CI was 2.4 on milrinone  0.25 mcg/kg/min and norepinephrine  6 mcg/min. RA was 12 mmHg and PCWP was 34 mmHg with V wave up to 56 mmHg. Lactate improved during case to 1.4. Given that, no MCS was placed and was transferred to [facility] 2H for further management. TTE 9/21 showed LVEF of 20%, severe MR, mild-mod TR, and severely enlarged LA.  Upon arrival to [facility] 2H, he reported feeling ok. He was on milrinone  0.25 mcg/kg/min and norepinephrine  of 3 mcg/min. He was in AF with RVR in the 130s.   Past Medical History:  Diagnosis Date   Adrenal mass (HCC) 2019   Aortic atherosclerosis (HCC)    Arthritis    Atrial fibrillation (HCC)    a.) CHA2DS2VASc = 4 (age, CHF,  HTN, vascular disease history);  b.) s/p DCCV (300 J x 1) 05/18/2021; c.) rate/rhythm maintained on oral digoxin ; chronically anticoagulated with apixaban    Benign neoplasm of sigmoid colon    BPH (benign prostatic hyperplasia)    Dental disease    Digital mucinous cyst of finger of right hand 2018   Elevated PSA    Heart murmur    Hepatic cyst    Hepatic hemangioma    HFrEF (heart failure with reduced ejection fraction) (HCC)    a.) TTE 05/14/2021: EF 25-30%, glob HK, mod dil LV, RVE, PASP 39.8; b.) TTE 06/25/2021: EF 30%, LAE, triv AR/PR, mild TR, mod MR, G1DD   HLD (hyperlipidemia)    HTN (hypertension)    Hyperthyroidism 2019   Left thyroid  nodule 2019   Liver lesion 2018   Long term current use of antiarrhythmic drug    a.) digoxin    Macrocytosis    Malnutrition of moderate degree (HCC) 05/15/2021   Marijuana use    Multinodular goiter    NICM (nonischemic cardiomyopathy) (HCC)    a.) TTE 05/14/2021: EF 25-30%; b.) TTE 06/25/2021: EF 30%   Non compliance w medication regimen    On apixaban  therapy    Osteomyelitis of great toe (HCC) 05/03/2023   Peripheral vascular disease (HCC)    Psoriasis 2017   Renal mass 2018   Right lower lobe pulmonary nodule 08/20/2021   a.) cCTA 08/20/2021:  measured 7 x 5 mm.   Thyroid  crisis or storm 05/13/2021   Tobacco abuse    Past Surgical History:  Procedure Laterality Date   AMPUTATION TOE Left 05/17/2023   Procedure: AMPUTATION TOE METATARSOPHALANGEAL JOINT;  Surgeon: Neill Boas, DPM;  Location: [facility] ORS;  Service: Orthopedics/Podiatry;  Laterality: Left;   COLONOSCOPY WITH PROPOFOL  N/A 12/01/2015   Procedure: COLONOSCOPY WITH PROPOFOL ;  Surgeon: Rogelia Copping, MD;  Location: [facility];  Service: Endoscopy;  Laterality: N/A;   POLYPECTOMY  12/01/2015   Procedure: POLYPECTOMY;  Surgeon: Rogelia Copping, MD;  Location: [facility];  Service: Endoscopy;;   PROSTATE BIOPSY N/A 05/25/2022   Procedure: PROSTATE BIOPSY;  Surgeon:  Twylla Glendia BROCKS, MD;  Location: [facility] ORS;  Service: Urology;  Laterality: N/A;   TEE WITH CARDIOVERSION N/A 05/18/2021   TRANSRECTAL ULTRASOUND N/A 05/25/2022   Procedure: TRANSRECTAL ULTRASOUND;  Surgeon: Twylla Glendia BROCKS, MD;  Location: [facility] ORS;  Service: Urology;  Laterality: N/A;     Medications Prior to Admission: Prior to Admission medications   Medication Sig Start Date End Date Taking? Authorizing Provider  albuterol  (VENTOLIN  HFA) 108 (90 Base) MCG/ACT inhaler INHALE 2 PUFFS INTO THE LUNGS EVERY 6 HOURS AS NEEDED FOR WHEEZING OR SHORTNESS OF BREATH 11/04/23   Johnson, Megan P, DO  apixaban  (ELIQUIS ) 5 MG TABS tablet TAKE 1 TABLET BY MOUTH TWICE DAILY 01/28/23   Johnson, Megan P, DO  atorvastatin  (LIPITOR) 20 MG tablet TAKE 1 TABLET BY MOUTH AT BEDTIME Patient not taking: Reported on 12/24/2023 06/20/23   Vicci Bouchard P, DO  digoxin  (LANOXIN ) 0.125 MG tablet Take 1 tablet (125 mcg total) by mouth daily. 06/20/23   Johnson, Megan P, DO  empagliflozin  (JARDIANCE ) 10 MG TABS tablet Take 1 tablet (10 mg total) by mouth daily. 06/20/23   Johnson, Megan P, DO  furosemide  (LASIX ) 40 MG tablet Take 1 tablet (40 mg total) by mouth daily. 06/20/23   Johnson, Megan P, DO  losartan  (COZAAR ) 25 MG tablet Take 1 tablet (25 mg total) by mouth daily. 06/20/23   Johnson, Megan P, DO  methimazole  (TAPAZOLE ) 10 MG tablet Take 1 tablet (10 mg total) by mouth See admin instructions. Take with 5 mg for a total of 15 mg daily Patient not taking: Reported on 12/24/2023 06/20/23   Vicci Bouchard P, DO  methimazole  (TAPAZOLE ) 5 MG tablet Take 5 mg by mouth See admin instructions. Take with 10 mg for a total of 15 mg daily    [provider]  spironolactone  (ALDACTONE ) 25 MG tablet Take 0.5 tablets (12.5 mg total) by mouth daily. 06/20/23   Johnson, Megan P, DO  tamsulosin  (FLOMAX ) 0.4 MG CAPS capsule Take 1 capsule (0.4 mg total) by mouth daily. Patient not taking: Reported on 12/24/2023 06/20/23   Vicci Bouchard SQUIBB, DO     Allergies:    Allergies  Allergen Reactions   Lactose Intolerance (Gi) Diarrhea    Social History:   Social History   Socioeconomic History   Marital status: Married    Spouse name: Not on file   Number of children: Not on file   Years of education: Not on file   Highest education level: High school graduate  Occupational History   Not on file  Tobacco Use   Smoking status: Former    Current packs/day: 0.00    Average packs/day: 0.3 packs/day for 42.0 years (10.5 ttl pk-yrs)    Types: Cigars, Cigarettes    Start date: 05/18/1979  Quit date: 05/17/2021    Years since quitting: 2.6   Smokeless tobacco: Never  Vaping Use   Vaping status: Never Used  Substance and Sexual Activity   Alcohol use: No   Drug use: Yes    Types: Marijuana    Comment: pt states he smokes every once in a while   Sexual activity: Yes    Birth control/protection: None  Other Topics Concern   Not on file  Social History Narrative   Works part time.   Social Drivers of Corporate investment banker Strain: Low Risk  (05/23/2023)   Received from [facility]   Overall Financial Resource Strain (CARDIA)    Difficulty of Paying Living Expenses: Not hard at all  Food Insecurity: No Food Insecurity (12/24/2023)   Hunger Vital Sign    Worried About Running Out of Food in the Last Year: Never true    Ran Out of Food in the Last Year: Never true  Transportation Needs: No Transportation Needs (12/24/2023)   PRAPARE - Administrator, Civil Service (Medical): No    Lack of Transportation (Non-Medical): No  Physical Activity: Insufficiently Active (01/12/2022)   Exercise Vital Sign    Days of Exercise per Week: 3 days    Minutes of Exercise per Session: 30 min  Stress: Stress Concern Present (01/12/2022)   [employer] of Occupational Health - Occupational Stress Questionnaire    Feeling of Stress : To some extent  Social Connections: Moderately Isolated  (12/24/2023)   Social Connection and Isolation Panel    Frequency of Communication with Friends and Family: Twice a week    Frequency of Social Gatherings with Friends and Family: Never    Attends Religious Services: More than 4 times per year    Active Member of [employer] or Organizations: Yes    Attends Engineer, structural: More than 4 times per year    Marital Status: Never married  Intimate Partner Violence: Not At Risk (12/24/2023)   Humiliation, Afraid, Rape, and Kick questionnaire    Fear of Current or Ex-Partner: No    Emotionally Abused: No    Physically Abused: No    Sexually Abused: No     Family History:   The patient's family history includes Diabetes in his mother; Emphysema in his father; Hyperlipidemia in his brother; Hypertension in his brother, father, and mother; Kidney disease in his mother; Sickle cell trait in his father and sister.    ROS:  Please see the history of present illness.  All other ROS reviewed and negative.     Physical Exam/Data: Vitals:   12/26/23 0045  Weight: 76.5 kg   No intake or output data in the 24 hours ending 12/26/23 0138     12/26/2023   12:45 AM 12/24/2023   11:36 AM 12/24/2023   11:08 AM  Last 3 Weights  Weight (lbs) 168 lb 10.4 oz 165 lb 165 lb  Weight (kg) 76.5 kg 74.844 kg 74.844 kg     Body mass index is 22.25 kg/m.  General:  Well nourished, well developed, in no acute distress HEENT: normal Neck: +JVD Vascular: No carotid bruits; Distal pulses 2+ bilaterally   Cardiac:  tachycardiac, irregular irregular rhythm  Lungs:  clear to auscultation bilaterally, no wheezing, rhonchi or rales  Abd: soft, nontender, no hepatomegaly  Ext: no edema Musculoskeletal:  No deformities, BUE and BLE strength normal and equal Skin: warm and dry  Neuro:  CNs 2-12 intact, no  focal abnormalities noted Psych:  Normal affect   EKG:  The ECG that was done 9/21 was personally reviewed and demonstrates sinus rhythm, QTC 504 msec.    Relevant CV Studies:  TTE 12/25/2023:  1. Left ventricular ejection fraction, by estimation, is 20 to 25%. Left  ventricular ejection fraction by PLAX is 21 %. The left ventricle has  severely decreased function. The left ventricle demonstrates global  hypokinesis. Left ventricular diastolic  parameters are indeterminate.   2. Right ventricular systolic function is normal. The right ventricular  size is normal.   3. Left atrial size was severely dilated.   4. The mitral valve is abnormal. Severe mitral valve regurgitation. No  evidence of mitral stenosis.   5. Tricuspid valve regurgitation is mild to moderate.   6. The aortic valve is normal in structure. Aortic valve regurgitation is  trivial. No aortic stenosis is present.   7. The inferior vena cava is normal in size with greater than 50%  respiratory variability, suggesting right atrial pressure of 3 mmHg.    L/RHC 12/25/2023: 1.  Normal right dominant circulation with no obstructive coronary artery disease. 2.  Metabolic acidosis treated with 3 amps of bicarbonate with subsequent normalization of ABG. 3.  With the patient on milrinone  0.25 mcg/kilogram/minute and norepinephrine  6 mcg/min, the following hemodynamically was obtained;            Fick cardiac output of 3.2 L/min and Fick cardiac index of 1.6 L/min/m            Thermodilution cardiac output of 4.8 L/min/m with thermodilution cardiac index of 2.4 L/min/m                       Right atrial pressure of mean of 12 with V waves to 15 mmHg                       PA pressure of 64/40 with a mean of 37 mmHg                       Wedge pressure mean of 34 mmHg with V waves to 56 mmHg                       PVR of 0.92 by Fick and 0.62 by TD                       PA pulsatility index of 2                       CPO of 0.51 by Fick and 0.75 by TD 4.  LVEDP of 24 mmHg 5.  Successful left radial arterial line placement. 6.  Capacious iliofemoral vessels  bilaterally  Laboratory Data: High Sensitivity Troponin:   Recent Labs  Lab 12/24/23 1111 12/24/23 1324 12/25/23 1958  TROPONINIHS 20* 20* 13      Chemistry Recent Labs  Lab 12/24/23 1111 12/25/23 0427 12/25/23 1312 12/25/23 2153 12/25/23 2217 12/25/23 2248  NA 139 137  --    < > 139 138  K 3.7 3.9  --    < > 4.5 4.4  CL 104 107  --   --   --   --   CO2 23 23  --   --   --   --   GLUCOSE 127* 116*  --   --   --   --  BUN 11 19  --   --   --   --   CREATININE 1.12 1.24  --   --   --   --   CALCIUM  8.8* 8.4*  --   --   --   --   MG 1.8  --  1.9  --   --   --   GFRNONAA >60 >60  --   --   --   --   ANIONGAP 12 7  --   --   --   --    < > = values in this interval not displayed.    No results for input(s): PROT, ALBUMIN, AST, ALT, ALKPHOS, BILITOT in the last 168 hours. Lipids No results for input(s): CHOL, TRIG, HDL, LABVLDL, LDLCALC, CHOLHDL in the last 168 hours. Hematology Recent Labs  Lab 12/24/23 1111 12/25/23 2153 12/25/23 2217 12/25/23 2248  WBC 5.7  --   --   --   RBC 4.36  --   --   --   HGB 13.9   < > 13.3 12.6*  HCT 41.7   < > 39.0 37.0*  MCV 95.6  --   --   --   MCH 31.9  --   --   --   MCHC 33.3  --   --   --   RDW 12.3  --   --   --   PLT 150  --   --   --    < > = values in this interval not displayed.   Thyroid   Recent Labs  Lab 12/24/23 1111  TSH <0.100*  FREET4 2.32*   BNP Recent Labs  Lab 12/24/23 1111  BNP 818.6*    DDimer  Recent Labs  Lab 12/24/23 1111  DDIMER 0.43    Radiology/Studies:  CARDIAC CATHETERIZATION Addendum Date: 12/25/2023 1.  Normal right dominant circulation with no obstructive coronary artery disease. 2.  Metabolic acidosis treated with 3 amps of bicarbonate with subsequent normalization of ABG. 3.  With the patient on milrinone  0.25 mcg/kilogram/minute and norepinephrine  6 mcg/min, the following hemodynamically was obtained;  Fick cardiac output of 3.2 L/min and Fick cardiac index  of 1.6 L/min/m  Thermodilution cardiac output of 4.8 L/min/m with thermodilution cardiac index of 2.4 L/min/m   Right atrial pressure of mean of 12 with V waves to 15 mmHg   PA pressure of 64/40 with a mean of 37 mmHg   Wedge pressure mean of 34 mmHg with V waves to 56 mmHg   PVR of 0.92 by Fick and 0.62 by TD   PA pulsatility index of 2   CPO of 0.51 by Fick and 0.75 by TD 4.  LVEDP of 24 mmHg 5.  Successful left radial arterial line placement. 6.  Capacious iliofemoral vessels bilaterally Summary: Given improving lactate (2.7 > 1.4), ABG, and CPO > 0.6, mechanical circulatory support was not pursued.  Iliofemoral vessels are approachable if mechanical circulatory support is needed in the future.  The results were discussed with Dr. Sabarwal who will accept the patient in transfer.  Continue milrinone  0.25 mcg/kg/min, and norepinephrine  to oh MAP goal of 70 mmHg.  Trend co-oximetry and lactate.  Discussed with sister Tyler Cohen by phone [phone])  Result Date: 12/25/2023 1.  Normal right dominant circulation with no obstructive coronary artery disease. 2.  Metabolic acidosis treated with 3 amps of bicarbonate with subsequent normalization of ABG. 3.  With the patient on milrinone  0.25 mcg/kilogram/minute and norepinephrine  6  mcg/min, the following hemodynamically was obtained;  Fick cardiac output of 3.2 L/min and Fick cardiac index of 1.6 L/min/m  Thermodilution cardiac output of 4.8 L/min/m with thermodilution cardiac index of 2.4 L/min/m   Right atrial pressure of mean of 12 with V waves to 15 mmHg   PA pressure of 64/40 with a mean of 37 mmHg   Wedge pressure mean of 34 mmHg with V waves to 56 mmHg   PVR of 0.92 by Fick and 0.62 by TD   PA pulsatility index of 2   CPO of 0.51 by Fick and 0.75 by TD 4.  LVEDP of 24 mmHg 5.  Capacious iliofemoral vessels bilaterally Summary: Given improving lactate (2.7 > 1.4), ABG, and CPO > 0.6, mechanical circulatory support was not pursued.  Iliofemoral vessels  are approachable if mechanical circulatory support is needed in the future.  The results were discussed with Dr. Sabarwal who will accept the patient in transfer.  Continue milrinone  0.25 mcg/kg/min, and norepinephrine  to oh MAP goal of 70 mmHg.  Trend co-oximetry and lactate.  Discussed with sister Tyler Cohen by phone [phone])   DG Chest Port 1 View Result Date: 12/25/2023 CLINICAL DATA:  Central line placement. EXAM: PORTABLE CHEST 1 VIEW COMPARISON:  Chest x-ray 12/25/2023. FINDINGS: There is a new left-sided central venous catheter with distal tip in the SVC. There is no pneumothorax. There are patchy airspace opacities in the central right lung similar to prior. The heart is enlarged, unchanged. No pleural effusion. IMPRESSION: 1. New left-sided central venous catheter with distal tip in the SVC. No pneumothorax. 2. Patchy airspace opacities in the central right lung similar to prior. Electronically Signed   By: Greig Pique M.D.   On: 12/25/2023 21:30   ECHOCARDIOGRAM COMPLETE Result Date: 12/25/2023    ECHOCARDIOGRAM REPORT   Patient Name:   Tyler Cohen Date of Exam: 12/25/2023 Medical Rec #:  [id number]       Height:       73.0 in Accession #:    [id number]      Weight:       165.0 lb Date of Birth:  Aug 13, 1955       BSA:          1.983 m Patient Age:    68 years        BP:           107/80 mmHg Patient Gender: M               HR:           119 bpm. Exam Location:  [facility] Procedure: 2D Echo, 3D Echo, Cardiac Doppler, Color Doppler and Strain Analysis            (Both Spectral and Color Flow Doppler were utilized during            procedure). Indications:     R07.89 Other chest pain; R07.9* Chest pain, unspecified  History:         Patient has prior history of Echocardiogram examinations, most                  recent 05/14/2021. Signs/Symptoms:Chest Pain.  Sonographer:     Doyal Point MHA, BS, RDCS Referring Phys:  [id number] CORT ONEIDA MANA Diagnosing Phys: Annalee Custovic IMPRESSIONS  1. Left  ventricular ejection fraction, by estimation, is 20 to 25%. Left ventricular ejection fraction by PLAX is 21 %. The left ventricle has severely decreased function. The left ventricle demonstrates  global hypokinesis. Left ventricular diastolic parameters are indeterminate.  2. Right ventricular systolic function is normal. The right ventricular size is normal.  3. Left atrial size was severely dilated.  4. The mitral valve is abnormal. Severe mitral valve regurgitation. No evidence of mitral stenosis.  5. Tricuspid valve regurgitation is mild to moderate.  6. The aortic valve is normal in structure. Aortic valve regurgitation is trivial. No aortic stenosis is present.  7. The inferior vena cava is normal in size with greater than 50% respiratory variability, suggesting right atrial pressure of 3 mmHg. FINDINGS  Left Ventricle: Left ventricular ejection fraction, by estimation, is 20 to 25%. Left ventricular ejection fraction by PLAX is 21 %. The left ventricle has severely decreased function. The left ventricle demonstrates global hypokinesis. The left ventricular internal cavity size was normal in size. There is no left ventricular hypertrophy. Left ventricular diastolic parameters are indeterminate. Right Ventricle: The right ventricular size is normal. No increase in right ventricular wall thickness. Right ventricular systolic function is normal. Left Atrium: Left atrial size was severely dilated. Right Atrium: Right atrial size was normal in size. Pericardium: There is no evidence of pericardial effusion. Mitral Valve: The mitral valve is abnormal. Severe mitral valve regurgitation. No evidence of mitral valve stenosis. Tricuspid Valve: The tricuspid valve is normal in structure. Tricuspid valve regurgitation is mild to moderate. Aortic Valve: The aortic valve is normal in structure. Aortic valve regurgitation is trivial. No aortic stenosis is present. Aortic valve mean gradient measures 3.0 mmHg. Aortic valve  peak gradient measures 5.0 mmHg. Aortic valve area, by VTI measures 2.27 cm. Pulmonic Valve: The pulmonic valve was normal in structure. Pulmonic valve regurgitation is mild. Aorta: The aortic root is normal in size and structure. Venous: The inferior vena cava is normal in size with greater than 50% respiratory variability, suggesting right atrial pressure of 3 mmHg. IAS/Shunts: No atrial level shunt detected by color flow Doppler.  LEFT VENTRICLE PLAX 2D LV EF:         Left ventricular ejection fraction by PLAX is 21 %. LVIDd:         6.00 cm LVIDs:         5.40 cm LV PW:         1.10 cm LV IVS:        0.90 cm LVOT diam:     2.30 cm LV SV:         39 LV SV Index:   20 LVOT Area:     4.15 cm  RIGHT VENTRICLE RV Basal diam:  4.25 cm RV Mid diam:    3.40 cm RV S prime:     14.90 cm/s TAPSE (M-mode): 1.7 cm LEFT ATRIUM              Index        RIGHT ATRIUM           Index LA diam:        3.80 cm  1.92 cm/m   RA Area:     20.40 cm LA Vol (A2C):   163.0 ml 82.21 ml/m  RA Volume:   65.10 ml  32.83 ml/m LA Vol (A4C):   147.0 ml 74.14 ml/m LA Biplane Vol: 161.0 ml 81.20 ml/m  AORTIC VALVE AV Area (Vmax):    2.47 cm AV Area (Vmean):   2.34 cm AV Area (VTI):     2.27 cm AV Vmax:           112.00 cm/s  AV Vmean:          79.100 cm/s AV VTI:            0.171 m AV Peak Grad:      5.0 mmHg AV Mean Grad:      3.0 mmHg LVOT Vmax:         66.60 cm/s LVOT Vmean:        44.500 cm/s LVOT VTI:          0.094 m LVOT/AV VTI ratio: 0.55  AORTA Ao Root diam: 3.90 cm MR Peak grad:    124.5 mmHg MR Mean grad:    83.0 mmHg    SHUNTS MR Vmax:         558.00 cm/s  Systemic VTI:  0.09 m MR Vmean:        431.0 cm/s   Systemic Diam: 2.30 cm MR PISA:         7.60 cm MR PISA Eff ROA: 52 mm MR PISA Radius:  1.10 cm Annalee Custovic Electronically signed by Annalee Casa Signature Date/Time: 12/25/2023/9:42:18 AM    Final    DG Chest 1 View Result Date: 12/25/2023 CLINICAL DATA:  CHF. EXAM: CHEST  1 VIEW COMPARISON:  12/24/2023  FINDINGS: The lungs are clear without focal pneumonia, edema, pneumothorax or pleural effusion. Interstitial markings are diffusely coarsened with chronic features. Cardiopericardial silhouette is at upper limits of normal for size. No acute bony abnormality. Telemetry leads overlie the chest. IMPRESSION: Chronic interstitial coarsening without acute cardiopulmonary findings. Electronically Signed   By: Camellia Candle M.D.   On: 12/25/2023 07:32     Assessment and Plan: Cardiogenic shock Non-ischemic cardiomyopathy with HFrEF (20%) Severe MR - Continue vasopressor support with levophed  and milrinone  0.25 mcg/kg/min - Ordered Lasix  40 mg IV given elevated filling pressures on RHC - Monitor CO and filling pressures using Swan - Hold GDMT given shock  4. AF with RVR - Will hold sotalol  for now (had only 1 dose at [facility]) given cardiogenic shock  - Restart amiodarone  gtt given RVR - Start heparin  gtt; hold home apixaban  in case more escalation of MCS is needed  5. Hyperthyroidism  TSH was <0.1 and T4 was 2.32. Although it appears these values are consistent with his prior values, which would make thyrotoxicosis less likely, this may still be contributing to his AF with RVR - Continue home methimazole  15 mg daily - Consider endo input in the AM   Risk Assessment/Risk Scores:      New [place]  Heart Association (NYHA) Functional Class NYHA Class IV  CHA2DS2-VASc Score =     This indicates a  % annual risk of stroke. The patient's score is based upon:      Code Status: Full Code  Severity of Illness: The appropriate patient status for this patient is INPATIENT. Inpatient status is judged to be reasonable and necessary in order to provide the required intensity of service to ensure the patient's safety. The patient's presenting symptoms, physical exam findings, and initial radiographic and laboratory data in the context of their chronic comorbidities is felt to place them at high risk for  further clinical deterioration. Furthermore, it is not anticipated that the patient will be medically stable for discharge from the hospital within 2 midnights of admission.   * I certify that at the point of admission it is my clinical judgment that the patient will require inpatient hospital care spanning beyond 2 midnights from the point of admission due to high intensity of  service, high risk for further deterioration and high frequency of surveillance required.*  For questions or updates, please contact [facility] Please consult [email or web address] for contact info under       Signed, Gillian CHRISTELLA Cass, MD  12/26/2023 1:38 AM

## 2023-12-26 NOTE — Progress Notes (Signed)
 PHARMACY - ANTICOAGULATION CONSULT NOTE  Pharmacy Consult for heparin  Indication: atrial fibrillation  Allergies  Allergen Reactions   Lactose Intolerance (Gi) Diarrhea and Other (See Comments)    Bloating, flatulence    Patient Measurements: Weight: 76.5 kg (168 lb 10.4 oz)  Vital Signs: Temp: 98.1 F (36.7 C) (09/22 0928) Temp Source: Oral (09/22 0800) BP: 129/109 (09/22 0928) Pulse Rate: 87 (09/22 0928)  Labs: Recent Labs    12/24/23 1111 12/24/23 1324 12/25/23 0427 12/25/23 1958 12/25/23 2027 12/25/23 2153 12/25/23 2217 12/25/23 2248 12/26/23 0201 12/26/23 0209 12/26/23 0900  HGB 13.9  --   --   --   --    < > 13.3 12.6*  --  12.9*  --   HCT 41.7  --   --   --   --    < > 39.0 37.0*  --  38.0*  --   PLT 150  --   --   --   --   --   --   --   --   --   --   APTT  --   --   --   --  31  --   --   --   --   --  48*  LABPROT  --   --   --   --   --   --   --   --  19.4*  --   --   INR  --   --   --   --   --   --   --   --  1.5*  --   --   HEPARINUNFRC  --   --   --   --   --   --   --   --   --   --  >1.10*  CREATININE 1.12  --  1.24  --   --   --   --   --  1.42*  --   --   TROPONINIHS 20* 20*  --  13  --   --   --   --   --   --   --    < > = values in this interval not displayed.    Estimated Creatinine Clearance: 53.9 mL/min (A) (by C-G formula based on SCr of 1.42 mg/dL (H)).   Medical History: Past Medical History:  Diagnosis Date   Adrenal mass (HCC) 2019   Aortic atherosclerosis (HCC)    Arthritis    Atrial fibrillation (HCC)    a.) CHA2DS2VASc = 4 (age, CHF, HTN, vascular disease history);  b.) s/p DCCV (300 J x 1) 05/18/2021; c.) rate/rhythm maintained on oral digoxin ; chronically anticoagulated with apixaban    Benign neoplasm of sigmoid colon    BPH (benign prostatic hyperplasia)    Dental disease    Digital mucinous cyst of finger of right hand 2018   Elevated PSA    Heart murmur    Hepatic cyst    Hepatic hemangioma    HFrEF (heart  failure with reduced ejection fraction) (HCC)    a.) TTE 05/14/2021: EF 25-30%, glob HK, mod dil LV, RVE, PASP 39.8; b.) TTE 06/25/2021: EF 30%, LAE, triv AR/PR, mild TR, mod MR, G1DD   HLD (hyperlipidemia)    HTN (hypertension)    Hyperthyroidism 2019   Left thyroid  nodule 2019   Liver lesion 2018   Long term current use of antiarrhythmic drug    a.) digoxin   Macrocytosis    Malnutrition of moderate degree (HCC) 05/15/2021   Marijuana use    Multinodular goiter    NICM (nonischemic cardiomyopathy) (HCC)    a.) TTE 05/14/2021: EF 25-30%; b.) TTE 06/25/2021: EF 30%   Non compliance w medication regimen    On apixaban  therapy    Osteomyelitis of great toe (HCC) 05/03/2023   Peripheral vascular disease (HCC)    Psoriasis 2017   Renal mass 2018   Right lower lobe pulmonary nodule 08/20/2021   a.) cCTA 08/20/2021: measured 7 x 5 mm.   Thyroid  crisis or storm 05/13/2021   Tobacco abuse     Medications:  Medications Prior to Admission  Medication Sig Dispense Refill Last Dose/Taking   albuterol  (VENTOLIN  HFA) 108 (90 Base) MCG/ACT inhaler INHALE 2 PUFFS INTO THE LUNGS EVERY 6 HOURS AS NEEDED FOR WHEEZING OR SHORTNESS OF BREATH 8.5 g 3 Unknown   apixaban  (ELIQUIS ) 5 MG TABS tablet TAKE 1 TABLET BY MOUTH TWICE DAILY 180 tablet 3 Past Week   digoxin  (LANOXIN ) 0.125 MG tablet Take 1 tablet (125 mcg total) by mouth daily.   Past Week   empagliflozin  (JARDIANCE ) 10 MG TABS tablet Take 1 tablet (10 mg total) by mouth daily. 90 tablet 1 Past Week   furosemide  (LASIX ) 40 MG tablet Take 1 tablet (40 mg total) by mouth daily.   Past Week   losartan  (COZAAR ) 25 MG tablet Take 1 tablet (25 mg total) by mouth daily. 90 tablet 1 Past Week   methimazole  (TAPAZOLE ) 10 MG tablet Take 10 mg by mouth See admin instructions. Take with 5 mg for a total of 15 mg daily   Past Week   methimazole  (TAPAZOLE ) 5 MG tablet Take 5 mg by mouth See admin instructions. Take with 10 mg for a total of 15 mg daily   Past  Week   Multiple Vitamins-Minerals (MENS MULTIVITAMIN PO) Take 1 tablet by mouth daily.   Past Week   phenylephrine  (SUDAFED PE) 10 MG TABS tablet Take 20 mg by mouth 2 (two) times daily as needed (sinus congestion).   Past Week   spironolactone  (ALDACTONE ) 25 MG tablet Take 0.5 tablets (12.5 mg total) by mouth daily. 45 tablet 1 Past Week    Assessment: Hamlin Devine. is a 68 y.o. male with medical history including PAF on Eliquis . Patient presented with chest pain and SOB. Patient experiencing hypotension and possibly developing shock. Pharmacy has been consulted to transition patient to heparin .  Initial aPTT is low, heparin  level falsely elevated by recent DOAC. No bleeding concerns per RN.  Goal of Therapy:  Heparin  level 0.3-0.7 units/ml aPTT 66-102 seconds Monitor platelets by anticoagulation protocol: Yes   Plan:  Increase heparin  to 1350 units/h Recheck aPTT in 6h  Ozell Jamaica, PharmD, [place], [facility] Clinical Pharmacist [phone] Please check AMION for all [facility] Pharmacy numbers 12/26/2023

## 2023-12-27 ENCOUNTER — Inpatient Hospital Stay (HOSPITAL_COMMUNITY)

## 2023-12-27 ENCOUNTER — Encounter (HOSPITAL_COMMUNITY): Payer: Self-pay | Admitting: Internal Medicine

## 2023-12-27 ENCOUNTER — Other Ambulatory Visit: Payer: Self-pay

## 2023-12-27 ENCOUNTER — Encounter (HOSPITAL_COMMUNITY)
Admission: EM | Disposition: A | Discharge status: Other Acute Inpatient Hospital | Payer: Self-pay | Source: Ambulatory Visit | Attending: Cardiology

## 2023-12-27 DIAGNOSIS — R042 Hemoptysis: Secondary | ICD-10-CM | POA: Diagnosis not present

## 2023-12-27 DIAGNOSIS — I110 Hypertensive heart disease with heart failure: Secondary | ICD-10-CM | POA: Diagnosis not present

## 2023-12-27 DIAGNOSIS — I509 Heart failure, unspecified: Secondary | ICD-10-CM | POA: Diagnosis not present

## 2023-12-27 DIAGNOSIS — I340 Nonrheumatic mitral (valve) insufficiency: Secondary | ICD-10-CM | POA: Diagnosis not present

## 2023-12-27 DIAGNOSIS — R570 Cardiogenic shock: Secondary | ICD-10-CM | POA: Diagnosis not present

## 2023-12-27 DIAGNOSIS — I4891 Unspecified atrial fibrillation: Secondary | ICD-10-CM

## 2023-12-27 DIAGNOSIS — I5023 Acute on chronic systolic (congestive) heart failure: Secondary | ICD-10-CM | POA: Diagnosis not present

## 2023-12-27 DIAGNOSIS — Z452 Encounter for adjustment and management of vascular access device: Secondary | ICD-10-CM | POA: Diagnosis not present

## 2023-12-27 HISTORY — PX: CARDIOVERSION: EP1203

## 2023-12-27 HISTORY — PX: TRANSESOPHAGEAL ECHOCARDIOGRAM (CATH LAB): EP1270

## 2023-12-27 LAB — BASIC METABOLIC PANEL WITH GFR
Anion gap: 10 (ref 5–15)
BUN: 16 mg/dL (ref 8–23)
CO2: 27 mmol/L (ref 22–32)
Calcium: 8.1 mg/dL — ABNORMAL LOW (ref 8.9–10.3)
Chloride: 99 mmol/L (ref 98–111)
Creatinine, Ser: 1.07 mg/dL (ref 0.61–1.24)
GFR, Estimated: >60 mL/min (ref >60)
Glucose, Bld: 93 mg/dL (ref 70–99)
Potassium: 3.7 mmol/L (ref 3.5–5.1)
Sodium: 136 mmol/L (ref 135–145)

## 2023-12-27 LAB — CBC
HCT: 38.2 % — ABNORMAL LOW (ref 39.0–52.0)
Hemoglobin: 12.8 g/dL — ABNORMAL LOW (ref 13.0–17.0)
MCH: 31.6 pg (ref 26.0–34.0)
MCHC: 33.5 g/dL (ref 30.0–36.0)
MCV: 94.3 fL (ref 80.0–100.0)
Platelets: 138 K/uL — ABNORMAL LOW (ref 150–400)
RBC: 4.05 MIL/uL — ABNORMAL LOW (ref 4.22–5.81)
RDW: 12 % (ref 11.5–15.5)
WBC: 11.3 K/uL — ABNORMAL HIGH (ref 4.0–10.5)
nRBC: 0 % (ref 0.0–0.2)

## 2023-12-27 LAB — APTT: aPTT: 76 s — ABNORMAL HIGH (ref 24–36)

## 2023-12-27 LAB — MAGNESIUM: Magnesium: 2 mg/dL (ref 1.7–2.4)

## 2023-12-27 LAB — COOXEMETRY PANEL
Carboxyhemoglobin: 1.5 % (ref 0.5–1.5)
Methemoglobin: <0.7 % (ref 0.0–1.5)
O2 Saturation: 68.1 %
Total hemoglobin: 12.7 g/dL (ref 12.0–16.0)

## 2023-12-27 LAB — T3, FREE: T3, Free: 6.4 pg/mL — ABNORMAL HIGH (ref 2.0–4.4)

## 2023-12-27 LAB — GLUCOSE, CAPILLARY
Glucose-Capillary: 86 mg/dL (ref 70–99)
Glucose-Capillary: 89 mg/dL (ref 70–99)
Glucose-Capillary: 115 mg/dL — ABNORMAL HIGH (ref 70–99)
Glucose-Capillary: 109 mg/dL — ABNORMAL HIGH (ref 70–99)

## 2023-12-27 LAB — DIGOXIN LEVEL: Digoxin Level: 0.7 ng/mL — ABNORMAL LOW (ref 0.8–2.0)

## 2023-12-27 LAB — HEPARIN LEVEL (UNFRACTIONATED): Heparin Unfractionated: 0.59 [IU]/mL (ref 0.30–0.70)

## 2023-12-27 SURGERY — TRANSESOPHAGEAL ECHOCARDIOGRAM (TEE) (CATHLAB)
Anesthesia: Monitor Anesthesia Care

## 2023-12-27 MED ORDER — POTASSIUM CHLORIDE CRYS ER 20 MEQ PO TBCR
40.0000 meq | EXTENDED_RELEASE_TABLET | Freq: Once | ORAL | Status: AC
  Administered 2023-12-27: 40 meq via ORAL
  Filled 2023-12-27: qty 2

## 2023-12-27 MED ORDER — AMIODARONE HCL 150 MG/3ML IV SOLN
INTRAVENOUS | Status: AC
Start: 2023-12-27 — End: 2023-12-27
  Filled 2023-12-27: qty 3

## 2023-12-27 MED ORDER — SODIUM CHLORIDE 0.9% FLUSH
INTRAVENOUS | Status: DC | PRN
  Administered 2023-12-27 (×4): 3 mL via INTRAVENOUS

## 2023-12-27 MED ORDER — SODIUM CHLORIDE 0.9% FLUSH
10.0000 mL | Freq: Two times a day (BID) | INTRAVENOUS | Status: DC
  Administered 2023-12-27: 30 mL
  Administered 2023-12-28: 40 mL
  Administered 2023-12-28 – 2023-12-31 (×7): 10 mL
  Administered 2023-12-31: 20 mL
  Administered 2024-01-01: 10 mL

## 2023-12-27 MED ORDER — AMIODARONE IV BOLUS ONLY 150 MG/100ML
INTRAVENOUS | Status: DC | PRN
  Administered 2023-12-27: 150 mg via INTRAVENOUS

## 2023-12-27 MED ORDER — LOSARTAN POTASSIUM 25 MG PO TABS
12.5000 mg | ORAL_TABLET | Freq: Every day | ORAL | Status: DC
  Administered 2023-12-27 – 2023-12-28 (×2): 12.5 mg via ORAL
  Filled 2023-12-27 (×2): qty 1

## 2023-12-27 MED ORDER — SODIUM CHLORIDE 0.9% FLUSH: 10.0000 mL | INTRAVENOUS | Status: DC | PRN

## 2023-12-27 MED ORDER — PROPOFOL 500 MG/50ML IV EMUL
INTRAVENOUS | Status: DC | PRN
Start: 2023-12-27 — End: 2023-12-27
  Administered 2023-12-27: 25 ug/kg/min via INTRAVENOUS

## 2023-12-27 MED ORDER — ACETAMINOPHEN 325 MG PO TABS
650.0000 mg | ORAL_TABLET | ORAL | Status: DC | PRN
  Administered 2023-12-28 – 2023-12-31 (×4): 650 mg via ORAL
  Filled 2023-12-27 (×4): qty 2

## 2023-12-27 MED ORDER — ETOMIDATE 2 MG/ML IV SOLN
INTRAVENOUS | Status: DC | PRN
  Administered 2023-12-27 (×5): 4 mg via INTRAVENOUS

## 2023-12-27 MED ORDER — PROPOFOL 10 MG/ML IV BOLUS
INTRAVENOUS | Status: DC | PRN
  Administered 2023-12-27: 10 mg via INTRAVENOUS
  Administered 2023-12-27: 5 mg via INTRAVENOUS
  Administered 2023-12-27 (×3): 10 mg via INTRAVENOUS
  Administered 2023-12-27: 5 mg via INTRAVENOUS
  Administered 2023-12-27: 10 mg via INTRAVENOUS

## 2023-12-27 MED ORDER — SODIUM CHLORIDE 0.9 % IV SOLN: INTRAVENOUS | Status: DC

## 2023-12-27 MED ORDER — DIGOXIN 125 MCG PO TABS
0.1250 mg | ORAL_TABLET | Freq: Every day | ORAL | Status: DC
  Administered 2023-12-28 – 2024-01-01 (×5): 0.125 mg via ORAL
  Filled 2023-12-27 (×5): qty 1

## 2023-12-27 MED ORDER — CHLORHEXIDINE GLUCONATE CLOTH 2 % EX PADS
6.0000 | MEDICATED_PAD | Freq: Every day | CUTANEOUS | Status: DC
Start: 2023-12-27 — End: 2024-01-01
  Administered 2023-12-27 – 2024-01-01 (×5): 6 via TOPICAL

## 2023-12-27 SURGICAL SUPPLY — 1 items: PAD DEFIB RADIO PHYSIO CONN (PAD) ×1 IMPLANT

## 2023-12-27 NOTE — Transfer of Care (Signed)
 Immediate Anesthesia Transfer of Care Note  Patient: Tyler Cohen.  Procedure(s) Performed: TRANSESOPHAGEAL ECHOCARDIOGRAM CARDIOVERSION  Patient Location: Cath Lab  Anesthesia Type:MAC  Level of Consciousness: awake, alert , oriented, and patient cooperative  Airway & Oxygen Therapy: Patient Spontanous Breathing and Patient connected to nasal cannula oxygen  Post-op Assessment: Report given to RN and Post -op Vital signs reviewed and stable  Post vital signs: Reviewed and stable  Last Vitals:  Vitals Value Taken Time  BP 91/62 10:05  Temp 106   Pulse    Resp 20   SpO2 94     Last Pain:  Vitals:   12/27/23 0815  TempSrc: Temporal  PainSc:       Patients Stated Pain Goal: 0 (12/26/23 2000)  Complications: There were no known notable events for this encounter.

## 2023-12-27 NOTE — Progress Notes (Signed)
 PHARMACY - ANTICOAGULATION CONSULT NOTE  Pharmacy Consult for heparin  Indication: atrial fibrillation  Allergies  Allergen Reactions   Lactose Intolerance (Gi) Diarrhea and Other (See Comments)    Bloating, flatulence    Patient Measurements: Weight: 76.5 kg (168 lb 10.4 oz)  Vital Signs: Temp: 97.3 F (36.3 C) (09/23 0815) Temp Source: Temporal (09/23 0815) BP: 91/79 (09/23 1035) Pulse Rate: 117 (09/23 1035)  Labs: Recent Labs    12/24/23 1324 12/25/23 0427 12/25/23 1958 12/25/23 2027 12/25/23 2248 12/26/23 0201 12/26/23 0209 12/26/23 0900 12/26/23 1459 12/26/23 1830 12/27/23 0500  HGB  --   --   --    < > 12.6*  --  12.9*  --  13.9  --   --   HCT  --   --   --    < > 37.0*  --  38.0*  --  41.0  --   --   APTT  --   --   --    < >  --   --   --  48*  --  84* 76*  LABPROT  --   --   --   --   --  19.4*  --   --   --   --   --   INR  --   --   --   --   --  1.5*  --   --   --   --   --   HEPARINUNFRC  --   --   --   --   --   --   --  >1.10*  --   --  0.59  CREATININE  --  1.24  --   --   --  1.42*  --   --   --   --  1.07  TROPONINIHS 20*  --  13  --   --   --   --   --   --   --   --    < > = values in this interval not displayed.    Estimated Creatinine Clearance: 71.5 mL/min (by C-G formula based on SCr of 1.07 mg/dL).  Assessment: Tyler Elie. is a 68 y.o. male with medical history including PAF on Eliquis . Patient presented with chest pain and SOB. Patient experiencing hypotension and possibly developing shock. Pharmacy has been consulted to transition patient to heparin .  Heparin  level 0.59 and aPTT of 76s are therapeutic and correlating with heparin  running at 135 units/hr. Hgb (12.8) and PLTs (138) are stable. Per RN, no report of pauses, issues with the line, or signs of bleeding.   Goal of Therapy:  Heparin  level 0.3-0.7 units/ml aPTT 66-102 seconds Monitor platelets by anticoagulation protocol: Yes   Plan:  Continue heparin  at 1350  units/h Monitor daily heparin  level, CBC, and signs/symptoms of bleeding F/u plan for transition to oral [facility]  Thank you for allowing pharmacy to be a part of this patient's care.   Tyler Cohen, PharmD PGY2 Cardiology Pharmacy Resident  Please check AMION for all [facility] Pharmacy phone numbers After 10:00 PM, call Main Pharmacy [phone] 12/27/2023 11:19 AM

## 2023-12-27 NOTE — CV Procedure (Signed)
 Procedure: TEE  Sedation: Per anesthesiology  Indication: Atrial fibrillation, mitral regurgitation  Findings: Please see echo section for full report. Mildly dilated left ventricle with severe systolic dysfunction, EF 20-25% with global hypokinesis.  Normal RV size with mild systolic dysfunction.  Moderately dilated left atrium with no LA appendage thrombus.  Normal right atrium.  There was a small PFO by color doppler.  Trivial TR. Trileaflet aortic valve with no stenosis and trivial regurgitation.  Severe mitral regurgitation with PISA ERO 0.48 cm^2. MR appears functional with restriction of the posterior leaflet and malcoaptation.  There was systolic flow reversal in the pulmonary vein doppler pattern.   May proceed with DCCV.   Tyler Cohen 12/27/2023 10:10 AM

## 2023-12-27 NOTE — Anesthesia Postprocedure Evaluation (Signed)
 Anesthesia Post Note  Patient: Tyler T Caperton Jr.  Procedure(s) Performed: TRANSESOPHAGEAL ECHOCARDIOGRAM CARDIOVERSION     Patient location during evaluation: PACU Anesthesia Type: MAC Level of consciousness: awake Pain management: pain level controlled Vital Signs Assessment: post-procedure vital signs reviewed and stable Respiratory status: spontaneous breathing, nonlabored ventilation and respiratory function stable Cardiovascular status: stable and blood pressure returned to baseline Postop Assessment: no apparent nausea or vomiting Anesthetic complications: no   There were no known notable events for this encounter.  Last Vitals:  Vitals:   12/27/23 0715 12/27/23 0815  BP: 124/71 108/84  Pulse: 100 95  Resp: (!) 27 19  Temp: 36.8 C (!) 36.3 C  SpO2: 95% 99%    Last Pain:  Vitals:   12/27/23 0815  TempSrc: Temporal  PainSc:                  Tyler Cohen

## 2023-12-27 NOTE — Anesthesia Preprocedure Evaluation (Addendum)
 Anesthesia Evaluation  Patient identified by MRN, date of birth, ID band Patient awake    Reviewed: Allergy & Precautions, NPO status , Patient's Chart, lab work & pertinent test results  History of Anesthesia Complications Negative for: history of anesthetic complications  Airway Mallampati: III  TM Distance: >3 FB Neck ROM: Full    Dental  (+) Dental Advisory Given, Edentulous Upper 1 tooth on the bottom:   Pulmonary neg shortness of breath, neg sleep apnea, neg COPD, neg recent URI, former smoker RLL pulmonary nodule   Pulmonary exam normal breath sounds clear to auscultation       Cardiovascular hypertension, Pt. on medications (-) angina + Peripheral Vascular Disease and +CHF  (-) Past MI, (-) Cardiac Stents and (-) CABG + dysrhythmias Atrial Fibrillation + Valvular Problems/Murmurs MR  Rhythm:Regular Rate:Normal  HLD  Cath 12/25/2023: 1.  Normal right dominant circulation with no obstructive coronary artery disease. 2.  Metabolic acidosis treated with 3 amps of bicarbonate with subsequent normalization of ABG. 3.  With the patient on milrinone  0.25 mcg/kilogram/minute and norepinephrine  6 mcg/min, the following hemodynamically was obtained;            Fick cardiac output of 3.2 L/min and Fick cardiac index of 1.6 L/min/m            Thermodilution cardiac output of 4.8 L/min/m with thermodilution cardiac index of 2.4 L/min/m                       Right atrial pressure of mean of 12 with V waves to 15 mmHg                       PA pressure of 64/40 with a mean of 37 mmHg                       Wedge pressure mean of 34 mmHg with V waves to 56 mmHg                       PVR of 0.92 by Fick and 0.62 by TD                       PA pulsatility index of 2                       CPO of 0.51 by Fick and 0.75 by TD 4.  LVEDP of 24 mmHg 5.  Successful left radial arterial line placement. 6.  Capacious iliofemoral vessels  bilaterally  TTE 12/25/2023: IMPRESSIONS    1. Left ventricular ejection fraction, by estimation, is 20 to 25%. Left  ventricular ejection fraction by PLAX is 21 %. The left ventricle has  severely decreased function. The left ventricle demonstrates global  hypokinesis. Left ventricular diastolic  parameters are indeterminate.   2. Right ventricular systolic function is normal. The right ventricular  size is normal.   3. Left atrial size was severely dilated.   4. The mitral valve is abnormal. Severe mitral valve regurgitation. No  evidence of mitral stenosis.   5. Tricuspid valve regurgitation is mild to moderate.   6. The aortic valve is normal in structure. Aortic valve regurgitation is  trivial. No aortic stenosis is present.   7. The inferior vena cava is normal in size with greater than 50%  respiratory variability, suggesting right atrial pressure of 3 mmHg.  Neuro/Psych negative neurological ROS     GI/Hepatic negative GI ROS,,,(+)     substance abuse  marijuana useHepatic hemangioma   Endo/Other  diabetes Hyperthyroidism Adrenal mass  Renal/GU ARFRenal disease (renal mass)     Musculoskeletal  (+) Arthritis ,    Abdominal   Peds  Hematology negative hematology ROS (+)   Anesthesia Other Findings 68 y.o. male with history of HFrEF/NICM w/ EF 20%, PAF, severe MR, hyperthyroidism and thyroid  nodule s/p I-131 ablation, prostate cancer s/p radiation therapy, hx left 2nd toe amputation 02/25 2/2 osteomyelitis.    Had recent viral syndrome. He was admitted to [facility] on 12/24/23 w/ AFL with RVR. Developed hypotension and cardiogenic shock after attempts to control rate with IV diltiazem  and IV metoprolol . Transferred to [facility] 12/26/23 for further management.  On amiodarone  and milrinone  infusions  Reproductive/Obstetrics                              Anesthesia Physical Anesthesia Plan  ASA: 4  Anesthesia Plan: MAC   Post-op Pain  Management: Minimal or no pain anticipated   Induction: Intravenous  PONV Risk Score and Plan: 1 and Propofol  infusion, TIVA and Treatment may vary due to age or medical condition  Airway Management Planned: Natural Airway and Nasal Cannula  Additional Equipment:   Intra-op Plan:   Post-operative Plan:   Informed Consent: I have reviewed the patients History and Physical, chart, labs and discussed the procedure including the risks, benefits and alternatives for the proposed anesthesia with the patient or authorized representative who has indicated his/her understanding and acceptance.     Dental advisory given  Plan Discussed with: CRNA and Anesthesiologist  Anesthesia Plan Comments: (Discussed with patient risks of MAC including, but not limited to, minor pain or discomfort, hearing people in the room, and possible need for backup general anesthesia. Risks for general anesthesia also discussed including, but not limited to, sore throat, hoarse voice, chipped/damaged teeth, injury to vocal cords, nausea and vomiting, allergic reactions, lung infection, heart attack, stroke, and death. All questions answered. )        Anesthesia Quick Evaluation

## 2023-12-27 NOTE — TOC CM/SW Note (Signed)
 CSW added financial resources to patients AVS.

## 2023-12-27 NOTE — Interval H&P Note (Signed)
 History and Physical Interval Note:  12/27/2023 9:13 AM  Tyler Cohen.  has presented today for surgery, with the diagnosis of aflutter.  The various methods of treatment have been discussed with the patient and family. After consideration of risks, benefits and other options for treatment, the patient has consented to  Procedure(s): TRANSESOPHAGEAL ECHOCARDIOGRAM (N/A) CARDIOVERSION (N/A) as a surgical intervention.  The patient's history has been reviewed, patient examined, no change in status, stable for surgery.  I have reviewed the patient's chart and labs.  Questions were answered to the patient's satisfaction.     Lavaughn Haberle [employer]

## 2023-12-27 NOTE — Progress Notes (Addendum)
 Patient ID: Tyler ONEIDA Jearlean Mickey., male   DOB: [birth year], 68 y.o.   MRN: [id number]     Advanced Heart Failure Rounding Note  Cardiologist: [facility] Consulting HF Cardiologist: Dr. Rolan  Chief Complaint: Cardiogenic shock  Subjective:    Milrinone  turned off today.  Co-ox 68% on milrinone  0.125.   DCCV to NSR this morning but frequent PACs noted.  Noted to have bloody secretions on suctioning with TEE.   No dyspnea this morning.   Swan #s CVP 5 PA 32/10 CI 2.4 CO-OX 68%   TEE: Mildly dilated left ventricle with severe systolic dysfunction, EF 20-25% with global hypokinesis. Normal RV size with mild systolic dysfunction. Moderately dilated left atrium with no LA appendage thrombus. Normal right atrium. There was a small PFO by color doppler. Trivial TR. Trileaflet aortic valve with no stenosis and trivial regurgitation. Severe mitral regurgitation with PISA ERO 0.48 cm^2. MR appears functional with restriction of the posterior leaflet and malcoaptation. There was systolic flow reversal in the pulmonary vein doppler pattern.   Objective:   Weight Range: 76.5 kg Body mass index is 22.25 kg/m.   Vital Signs:   Temp:  [97.3 F (36.3 C)-99 F (37.2 C)] 97.3 F (36.3 C) (09/23 0815) Pulse Rate:  [71-123] 95 (09/23 0815) Resp:  [10-31] 19 (09/23 0815) BP: (78-124)/(49-95) 108/84 (09/23 0815) SpO2:  [80 %-100 %] 99 % (09/23 0815) Last BM Date : 12/24/23  Weight change: Filed Weights   12/26/23 0045  Weight: 76.5 kg    Intake/Output:   Intake/Output Summary (Last 24 hours) at 12/27/2023 1013 Last data filed at 12/27/2023 1009 Gross per 24 hour  Intake 667.75 ml  Output 410 ml  Net 257.75 ml      Physical Exam    General: NAD Neck: JVP not elevated, no thyromegaly or thyroid  nodule.  Lungs: Clear to auscultation bilaterally with normal respiratory effort. CV: Lateral PMI.  Heart irregular S1/S2, no S3/S4, 3/6 HSM apex.  No peripheral edema.    Abdomen: Soft, nontender,  no hepatosplenomegaly, no distention.  Skin: Intact without lesions or rashes.  Neurologic: Alert and oriented x 3.  Psych: Normal affect. Extremities: No clubbing or cyanosis.  HEENT: Normal.   Telemetry   Atrial fibrillation 110s => NSR + PACs with DCCV (personally reviewed)  Labs    CBC Recent Labs    12/24/23 1111 12/25/23 2153 12/26/23 0209 12/26/23 1459  WBC 5.7  --   --   --   HGB 13.9   < > 12.9* 13.9  HCT 41.7   < > 38.0* 41.0  MCV 95.6  --   --   --   PLT 150  --   --   --    < > = values in this interval not displayed.   Basic Metabolic Panel Recent Labs    90/77/74 0201 12/26/23 0209 12/26/23 1459 12/27/23 0500  NA 139   < > 140 136  K 4.1   < > 4.5 3.7  CL 101  --   --  99  CO2 26  --   --  27  GLUCOSE 132*  --   --  93  BUN 26*  --   --  16  CREATININE 1.42*  --   --  1.07  CALCIUM  8.3*  --   --  8.1*  MG 2.2  --   --  2.0   < > = values in this interval not displayed.   Liver Function Tests  Recent Labs    12/26/23 0201  AST 60*  ALT 74*  ALKPHOS 52  BILITOT 1.3*  PROT 5.9*  ALBUMIN 2.9*   No results for input(s): LIPASE, AMYLASE in the last 72 hours. Cardiac Enzymes No results for input(s): CKTOTAL, CKMB, CKMBINDEX, TROPONINI in the last 72 hours.  BNP: BNP (last 3 results) Recent Labs    12/24/23 1111  BNP 818.6*    ProBNP (last 3 results) No results for input(s): PROBNP in the last 8760 hours.   D-Dimer Recent Labs    12/24/23 1111  DDIMER 0.43   Hemoglobin A1C Recent Labs    12/25/23 0427  HGBA1C 5.4   Fasting Lipid Panel No results for input(s): CHOL, HDL, LDLCALC, TRIG, CHOLHDL, LDLDIRECT in the last 72 hours. Thyroid  Function Tests Recent Labs    12/24/23 1111 12/24/23 1324  TSH <0.100*  --   T3FREE  --  6.4*    Other results:   Imaging    EP STUDY Result Date: 12/27/2023 See surgical note for result.  DG CHEST PORT 1 VIEW Result Date: 12/26/2023 CLINICAL DATA:   [id number] Swan neck deformity of cervical spine [id number] EXAM: PORTABLE CHEST 1 VIEW COMPARISON:  12/26/2023 FINDINGS: Swan-Ganz catheter tip has migrated into the right upper lobe pulmonary artery. Left central line tip in the SVC. Heart is upper limits normal in size. Mediastinal contours within normal limits. Stable patchy right perihilar opacities. No focal opacity on the left. No effusions. No pneumothorax. IMPRESSION: Swan-Ganz catheter tip now in the right upper lobe pulmonary artery. Stable patchy right perihilar opacities. Electronically Signed   By: Franky Crease M.D.   On: 12/26/2023 20:29     Medications:     Scheduled Medications:  [MAR Hold] atorvastatin   20 mg Oral Daily   [MAR Hold] budesonide  (PULMICORT ) nebulizer solution  0.25 mg Nebulization BID   [START ON 12/28/2023] digoxin   0.125 mg Oral Daily   [MAR Hold] insulin  aspart  0-9 Units Subcutaneous TID WC   [MAR Hold] methimazole   15 mg Oral Daily   [MAR Hold] sodium chloride  flush  3 mL Intravenous Q12H   [MAR Hold] spironolactone   25 mg Oral Daily    Infusions:  sodium chloride  10 mL/hr at 12/27/23 9092   amiodarone  30 mg/hr (12/27/23 0907)   heparin  1,350 Units/hr (12/27/23 0907)   milrinone  Stopped (12/27/23 0910)   [MAR Hold] norepinephrine  (LEVOPHED ) Adult infusion Stopped (12/26/23 0714)    PRN Medications: [MAR Hold] acetaminophen , [MAR Hold] mouth rinse, [MAR Hold] sodium chloride  flush    Patient Profile   68 y.o. male with history of HFrEF/NICM w/ EF 20%, PAF, severe MR, hyperthyroidism and thyroid  nodule s/p I-131 ablation, prostate cancer s/p radiation therapy, hx left 2nd toe amputation 02/25 2/2 osteomyelitis.   Had recent viral syndrome. He was admitted to [facility] on 12/24/23 w/ AFL with RVR. Developed hypotension and cardiogenic shock after attempts to control rate with IV diltiazem  and IV metoprolol . Transferred to [facility] 12/26/23 for further management.  Assessment/Plan   Acute on chronic HFrEF >>  Cardiogenic shock - EF has historically been in 20-25% range - Now presenting with cardiogenic shock in setting of Afib with RVR. Became hypotensive require pressor support after receiving IV diltiazem  and IV metoprolol . To complicate things, has severe MR - [facility] 12/25/23 on 0.25 milrinone  + 6 NE: No CAD, RA mean 12, PA 64/40, PCWP mean 34 w/ v waves to 56, Fick CI 1.6, TD CI 2.4, CPO 0.51 by Fick and 0.75 by  TD, LVEDP 24 - Lactic acid 2.7>1.6 - Echo this admit EF 20-25%, RV okay, severe LAE, severe MR - TD CI 2.4 and CO-OX 68% on 0.125 milrinone  this morning. Milrinone  stopped. Will recheck hemodynamics off milrinone .  - CVP 5 this am. Hold off on diuresis, will check CVP later today.  - Loaded with digoxin  at OSH, level 0.9 today.  Restart digoxin  0.125 tomorrow.  - Continue spironolactone .  - Add losartan  12.5 daily.   2. Atrial fibrillation/flutter with RVR - Admit with AFL with RVR in setting of hyperthyroidism - Would avoid IV beta blocker and IV diltiazem  in the future given the severity of his heart failure.  - Heparin  gtt - DCCV to NSR 9/23 but frequent atrial ectopy and worry AF will recur.  - Continue amiodarone  gtt.  This is not going to be ideal long-term with hyperthyroidism but was required for rate control in setting of AF/RVR/cardiogenic shock.   3. Severe MR - Suspect functional MR, confirmed severe MR on TEE 9/23 with restricted posterior leaflet and malcoaptation of leaflets.   - MR is worsening CHF, will discuss mTEER with Dr. Wendel.   4. Hyperthyroidism - Chronic hyperthyroidism in setting of multnodular goiter, h/o I-131 ablation.  - On methimazole  (unclear what dose he was taking prior to admission). Has been started on 15 mg methimazole  daily here. - TSH < 0.1, free T4 2.3 -  Sees endocrine at [facility]  5. AKI  - Resolved, creatinine 1.07 today.   6. Hemoptysis/bloody secretions with suctioning - CXR today - Needs CBC today.   CRITICAL CARE Performed by:  [name]  Total critical care time: 40 minutes  Critical care time was exclusive of separately billable procedures and treating other patients.  Critical care was necessary to treat or prevent imminent or life-threatening deterioration.  Critical care was time spent personally by me on the following activities: development of treatment plan with patient and/or surrogate as well as nursing, discussions with consultants, evaluation of patient's response to treatment, examination of patient, obtaining history from patient or surrogate, ordering and performing treatments and interventions, ordering and review of laboratory studies, ordering and review of radiographic studies, pulse oximetry and re-evaluation of patient's condition.  [name] 12/27/2023 10:13 AM

## 2023-12-27 NOTE — Procedures (Signed)
 Electrical Cardioversion Procedure Note Tyler Cohen [id number] 20-Sep-1955  Procedure: Electrical Cardioversion Indications:  Atrial Fibrillation  Procedure Details Consent: Risks of procedure as well as the alternatives and risks of each were explained to the (patient/caregiver).  Consent for procedure obtained. Time Out: Verified patient identification, verified procedure, site/side was marked, verified correct patient position, special equipment/implants available, medications/allergies/relevent history reviewed, required imaging and test results available.  Performed  Patient placed on cardiac monitor, pulse oximetry, supplemental oxygen as necessary.  Sedation given: Propofol  per anesthesiology Pacer pads placed anterior and posterior chest.  Cardioverted 2 time(s).  Cardioverted at 360J.  Evaluation Findings: Post procedure EKG shows: NSR with frequent PACs.  Complications: None Patient did tolerate procedure well.  With frequent PACs, I worry that he will not remain in NSR.    [name] 12/27/2023, 10:10 AM

## 2023-12-27 NOTE — Progress Notes (Signed)
  Echocardiogram Echocardiogram Transesophageal has been performed.  Koleen KANDICE Popper, RDCS 12/27/2023, 10:18 AM

## 2023-12-27 NOTE — Discharge Instructions (Signed)
 Financial Resources  Agency Name: Nurse, adult Counseling Address: [address]  [place] [address] Phone: [phone] Website: [email or web address]://[email or web address].org/financial-[email or web address] Service(s) Offered: Assists individuals and families, avoid bankruptcy or  foreclosure, achieve financial goals, and regain a sense  of financial well-being.  Agency Name: [facility] Address: [address] [place] [address] Phone: [phone] Website: StockBudget.de Service(s) Offered: Provides medical, food/nutrition, child care, low-income  energy, [employer] and emergency assistance.  Agency Name: [facility] Address: [address] [place]. [place] [address] Phone: [phone] Website: [email or web address].[email or web address]/ Service(s) Offered: Food assistance hours are from 9:30 a.m. to 4:30 p.m.  Monday through Friday. Those who need to come after  4:30 p.m. should call [phone] or [phone], to make  arrangements.

## 2023-12-28 ENCOUNTER — Encounter (HOSPITAL_COMMUNITY): Payer: Self-pay | Admitting: Cardiology

## 2023-12-28 DIAGNOSIS — I5023 Acute on chronic systolic (congestive) heart failure: Secondary | ICD-10-CM | POA: Diagnosis not present

## 2023-12-28 DIAGNOSIS — R570 Cardiogenic shock: Secondary | ICD-10-CM | POA: Diagnosis not present

## 2023-12-28 DIAGNOSIS — I4891 Unspecified atrial fibrillation: Secondary | ICD-10-CM | POA: Diagnosis not present

## 2023-12-28 DIAGNOSIS — I340 Nonrheumatic mitral (valve) insufficiency: Secondary | ICD-10-CM | POA: Diagnosis not present

## 2023-12-28 LAB — CBC
HCT: 36.2 % — ABNORMAL LOW (ref 39.0–52.0)
Hemoglobin: 12.3 g/dL — ABNORMAL LOW (ref 13.0–17.0)
MCH: 31.9 pg (ref 26.0–34.0)
MCHC: 34 g/dL (ref 30.0–36.0)
MCV: 93.8 fL (ref 80.0–100.0)
Platelets: 120 K/uL — ABNORMAL LOW (ref 150–400)
RBC: 3.86 MIL/uL — ABNORMAL LOW (ref 4.22–5.81)
RDW: 12 % (ref 11.5–15.5)
WBC: 7.9 K/uL (ref 4.0–10.5)
nRBC: 0 % (ref 0.0–0.2)

## 2023-12-28 LAB — GLUCOSE, CAPILLARY
Glucose-Capillary: 96 mg/dL (ref 70–99)
Glucose-Capillary: 154 mg/dL — ABNORMAL HIGH (ref 70–99)
Glucose-Capillary: 114 mg/dL — ABNORMAL HIGH (ref 70–99)
Glucose-Capillary: 110 mg/dL — ABNORMAL HIGH (ref 70–99)

## 2023-12-28 LAB — COOXEMETRY PANEL
Carboxyhemoglobin: 1.8 % — ABNORMAL HIGH (ref 0.5–1.5)
Methemoglobin: <0.7 % (ref 0.0–1.5)
O2 Saturation: 66.8 %
Total hemoglobin: 13 g/dL (ref 12.0–16.0)

## 2023-12-28 LAB — BASIC METABOLIC PANEL WITH GFR
Anion gap: 8 (ref 5–15)
BUN: 11 mg/dL (ref 8–23)
CO2: 26 mmol/L (ref 22–32)
Calcium: 8.3 mg/dL — ABNORMAL LOW (ref 8.9–10.3)
Chloride: 103 mmol/L (ref 98–111)
Creatinine, Ser: 1.1 mg/dL (ref 0.61–1.24)
GFR, Estimated: >60 mL/min (ref >60)
Glucose, Bld: 102 mg/dL — ABNORMAL HIGH (ref 70–99)
Potassium: 3.9 mmol/L (ref 3.5–5.1)
Sodium: 137 mmol/L (ref 135–145)

## 2023-12-28 LAB — MAGNESIUM: Magnesium: 2 mg/dL (ref 1.7–2.4)

## 2023-12-28 LAB — HEPARIN LEVEL (UNFRACTIONATED): Heparin Unfractionated: 0.31 [IU]/mL (ref 0.30–0.70)

## 2023-12-28 MED ORDER — LOSARTAN POTASSIUM 25 MG PO TABS: 25.0000 mg | ORAL_TABLET | Freq: Every day | ORAL | Status: DC

## 2023-12-28 MED ORDER — POTASSIUM CHLORIDE CRYS ER 20 MEQ PO TBCR
20.0000 meq | EXTENDED_RELEASE_TABLET | Freq: Once | ORAL | Status: AC
  Administered 2023-12-28: 20 meq via ORAL
  Filled 2023-12-28: qty 1

## 2023-12-28 MED ORDER — TRAZODONE HCL 50 MG PO TABS
50.0000 mg | ORAL_TABLET | Freq: Every day | ORAL | Status: DC
Start: 2023-12-28 — End: 2024-01-01
  Administered 2023-12-28 – 2023-12-31 (×4): 50 mg via ORAL
  Filled 2023-12-28 (×4): qty 1

## 2023-12-28 MED ORDER — LOSARTAN POTASSIUM 25 MG PO TABS
12.5000 mg | ORAL_TABLET | Freq: Once | ORAL | Status: AC
  Administered 2023-12-28: 12.5 mg via ORAL
  Filled 2023-12-28: qty 1

## 2023-12-28 MED ORDER — TRAZODONE HCL 50 MG PO TABS
50.0000 mg | ORAL_TABLET | Freq: Once | ORAL | Status: AC
  Administered 2023-12-28: 50 mg via ORAL
  Filled 2023-12-28: qty 1

## 2023-12-28 MED ORDER — AMIODARONE LOAD VIA INFUSION
150.0000 mg | Freq: Once | INTRAVENOUS | Status: AC
  Administered 2023-12-28: 150 mg via INTRAVENOUS
  Filled 2023-12-28: qty 83.34

## 2023-12-28 NOTE — Progress Notes (Signed)
 Patient ID: Tyler Cohen., male   DOB: 1955-08-09, 68 y.o.   MRN: [id number]     Advanced Heart Failure Rounding Note  Cardiologist: [facility] Consulting HF Cardiologist: Dr. Rolan  Chief Complaint: Cardiogenic shock  Subjective:    Off milrinone , co-ox 67% this morning.    He has been in and out of atrial fibrillation overnight, had DCCV yesterday.   No dyspnea this morning, walked in hall.   Swan #s CVP <5 PA 46/23 CI 2.22 CO-OX 67%   TEE: Mildly dilated left ventricle with severe systolic dysfunction, EF 20-25% with global hypokinesis. Normal RV size with mild systolic dysfunction. Moderately dilated left atrium with no LA appendage thrombus. Normal right atrium. There was a small PFO by color doppler. Trivial TR. Trileaflet aortic valve with no stenosis and trivial regurgitation. Severe mitral regurgitation with PISA ERO 0.48 cm^2. MR appears functional with restriction of the posterior leaflet and malcoaptation. There was systolic flow reversal in the pulmonary vein doppler pattern.   Objective:   Weight Range: 78 kg Body mass index is 22.69 kg/m.   Vital Signs:   Temp:  [97.6 F (36.4 C)-99.5 F (37.5 C)] 98.4 F (36.9 C) (09/24 0600) Pulse Rate:  [77-134] 106 (09/24 1001) Resp:  [15-29] 15 (09/24 0600) BP: (90-125)/(71-91) 107/79 (09/24 0400) SpO2:  [82 %-99 %] 96 % (09/24 0600) Weight:  [76.5 kg-78 kg] 78 kg (09/24 0500) Last BM Date : 12/27/23  Weight change: Filed Weights   12/26/23 0045 12/27/23 1100 12/28/23 0500  Weight: 76.5 kg 76.5 kg 78 kg    Intake/Output:   Intake/Output Summary (Last 24 hours) at 12/28/2023 1011 Last data filed at 12/28/2023 0826 Gross per 24 hour  Intake 1241.38 ml  Output 2175 ml  Net -933.62 ml      Physical Exam    General: NAD Neck: He has catheters in both IJs, no thyromegaly or thyroid  nodule.  Lungs: Clear to auscultation bilaterally with normal respiratory effort. CV: Lateral PMI.  Heart irregular S1/S2, no  S3/S4, 3/6 HSM apex.  No peripheral edema.    Abdomen: Soft, nontender, no hepatosplenomegaly, no distention.  Skin: Intact without lesions or rashes.  Neurologic: Alert and oriented x 3.  Psych: Normal affect. Extremities: No clubbing or cyanosis.  HEENT: Normal.   Telemetry   In and out of atrial fibrillation (personally reviewed)  Labs    CBC Recent Labs    12/27/23 1108 12/28/23 0520  WBC 11.3* 7.9  HGB 12.8* 12.3*  HCT 38.2* 36.2*  MCV 94.3 93.8  PLT 138* 120*   Basic Metabolic Panel Recent Labs    90/76/74 0500 12/28/23 0520  NA 136 137  K 3.7 3.9  CL 99 103  CO2 27 26  GLUCOSE 93 102*  BUN 16 11  CREATININE 1.07 1.10  CALCIUM  8.1* 8.3*  MG 2.0 2.0   Liver Function Tests Recent Labs    12/26/23 0201  AST 60*  ALT 74*  ALKPHOS 52  BILITOT 1.3*  PROT 5.9*  ALBUMIN 2.9*   No results for input(s): LIPASE, AMYLASE in the last 72 hours. Cardiac Enzymes No results for input(s): CKTOTAL, CKMB, CKMBINDEX, TROPONINI in the last 72 hours.  BNP: BNP (last 3 results) Recent Labs    12/24/23 1111  BNP 818.6*    ProBNP (last 3 results) No results for input(s): PROBNP in the last 8760 hours.   D-Dimer No results for input(s): DDIMER in the last 72 hours.  Hemoglobin A1C No results for  input(s): HGBA1C in the last 72 hours.  Fasting Lipid Panel No results for input(s): CHOL, HDL, LDLCALC, TRIG, CHOLHDL, LDLDIRECT in the last 72 hours. Thyroid  Function Tests No results for input(s): TSH, T4TOTAL, T3FREE, THYROIDAB in the last 72 hours.  Invalid input(s): FREET3   Other results:   Imaging    DG CHEST PORT 1 VIEW Result Date: 12/27/2023 EXAM: 1 VIEW(S) XRAY OF THE CHEST 12/27/2023 11:19:00 AM COMPARISON: 12/26/2023 CLINICAL HISTORY: Hemoptysis FINDINGS: LINES, TUBES AND DEVICES: Left IJ central line stable in position. Right IJ Swan-Ganz catheter stable in position with tip projecting over right upper  lobe pulmonary artery branch. LUNGS AND PLEURA: No focal pulmonary opacity. No pulmonary edema. No pleural effusion. No pneumothorax. HEART AND MEDIASTINUM: No acute abnormality of the cardiac and mediastinal silhouettes. BONES AND SOFT TISSUES: No acute osseous abnormality. IMPRESSION: 1. Right IJ Swan-Ganz catheter stable in position with tip projecting over right upper lobe pulmonary artery branch, suggest retracting 4-5 cm. 2. Stable exam with no pulmonary edema. Electronically signed by: Jason Poff MD 12/27/2023 03:15 PM EDT RP Workstation: [id number]     Medications:     Scheduled Medications:  amiodarone   150 mg Intravenous Once   atorvastatin   20 mg Oral Daily   budesonide  (PULMICORT ) nebulizer solution  0.25 mg Nebulization BID   Chlorhexidine  Gluconate Cloth  6 each Topical Daily   digoxin   0.125 mg Oral Daily   insulin  aspart  0-9 Units Subcutaneous TID WC   losartan   12.5 mg Oral Daily   methimazole   15 mg Oral Daily   sodium chloride  flush  10-40 mL Intracatheter Q12H   sodium chloride  flush  3 mL Intravenous Q12H   spironolactone   25 mg Oral Daily    Infusions:  amiodarone  60 mg/hr (12/28/23 1002)   heparin  1,350 Units/hr (12/28/23 0700)    PRN Medications: acetaminophen , mouth rinse, sodium chloride  flush, sodium chloride  flush    Patient Profile   68 y.o. male with history of HFrEF/NICM w/ EF 20%, PAF, severe MR, hyperthyroidism and thyroid  nodule s/p I-131 ablation, prostate cancer s/p radiation therapy, hx left 2nd toe amputation 02/25 2/2 osteomyelitis.   Had recent viral syndrome. He was admitted to [facility] on 12/24/23 w/ AFL with RVR. Developed hypotension and cardiogenic shock after attempts to control rate with IV diltiazem  and IV metoprolol . Transferred to [facility] 12/26/23 for further management.  Assessment/Plan   Acute on chronic HFrEF >> Cardiogenic shock - EF has historically been in 20-25% range - Now presenting with cardiogenic shock in setting of Afib  with RVR. Became hypotensive require pressor support after receiving IV diltiazem  and IV metoprolol . To complicate things, has severe MR - [facility] 12/25/23 on 0.25 milrinone  + 6 NE: No CAD, RA mean 12, PA 64/40, PCWP mean 34 w/ v waves to 56, Fick CI 1.6, TD CI 2.4, CPO 0.51 by Fick and 0.75 by TD, LVEDP 24 - Lactic acid 2.7>1.6 - Echo this admit EF 20-25%, RV okay, severe LAE, severe MR - TD CI 2.22 and CO-OX 67% off milrinone .   - CVP < 5 this am. Hold off on diuresis for now but will likely need to start on at least po diuretic soon (PA pressure elevated in setting of severe MR).   - Continue digoxin  0.125 daily.  - Continue spironolactone  25 daily.  - Increase losartan  to 25 daily, hopefully to Entresto  eventually.    2. Atrial fibrillation/flutter with RVR - Admit with AFL with RVR in setting of hyperthyroidism - Would  avoid IV beta blocker and IV diltiazem  in the future given the severity of his heart failure.  - Heparin  gtt - DCCV to NSR 9/23 but has been in and out of AF since that time.  - He is on amiodarone  gtt, will increase to 60 mg/hr today to try to keep him in NSR.  This is not going to be ideal long-term with hyperthyroidism but was required for rate control in setting of AF/RVR/cardiogenic shock.   3. Severe MR - Suspect functional MR, confirmed severe MR on TEE 9/23 with restricted posterior leaflet and malcoaptation of leaflets.  This is potentiating CHF and atrial fibrillation.  - Will ask structural team to see to consider mTEER.    4. Hyperthyroidism - Chronic hyperthyroidism in setting of multnodular goiter, h/o I-131 ablation.  - On methimazole  (unclear what dose he was taking prior to admission). Has been started on 15 mg methimazole  daily here. - TSH < 0.1, free T4 2.3 -  Sees endocrine at [facility]  5. AKI  - Resolved, creatinine 1.1 today.   CRITICAL CARE Performed by: [name]  Total critical care time: 35 minutes  Critical care time was exclusive of  separately billable procedures and treating other patients.  Critical care was necessary to treat or prevent imminent or life-threatening deterioration.  Critical care was time spent personally by me on the following activities: development of treatment plan with patient and/or surrogate as well as nursing, discussions with consultants, evaluation of patient's response to treatment, examination of patient, obtaining history from patient or surrogate, ordering and performing treatments and interventions, ordering and review of laboratory studies, ordering and review of radiographic studies, pulse oximetry and re-evaluation of patient's condition.  [name] 12/28/2023 10:11 AM

## 2023-12-28 NOTE — Evaluation (Signed)
 Occupational Therapy Evaluation Patient Details Name: Tyler T Herro Jr. MRN: [id number] DOB: [birth year] Today's Date: 12/28/2023   History of Present Illness   Pt is 68 yo presenting to [facility] on 9/22 due to palpitations and was found to be in afib RVR with HR in 140's. Pmh: HFrEF, PAF, hypothyroidism, thyroid  nodule, prostate cancer.     Clinical Impressions PT admitted with afib with RVR. Pt currently with functional limitiations due to the deficits listed below (see OT problem list). Pt at baseline indep and driving. Pt at this time with HR 131 with sit <>Stand task. Pt educated on energy conservation. Pt progressing well and likely no follow up needs.  Pt will benefit from skilled OT to increase their independence and safety with adls and balance to allow discharge home.      If plan is discharge home, recommend the following:   A little help with bathing/dressing/bathroom     Functional Status Assessment   Patient has had a recent decline in their functional status and demonstrates the ability to make significant improvements in function in a reasonable and predictable amount of time.     Equipment Recommendations   Other (comment)     Recommendations for Other Services         Precautions/Restrictions   Precautions Precautions: Fall Recall of Precautions/Restrictions: Impaired Precaution/Restrictions Comments: CVP line, SWAN on R Restrictions Weight Bearing Restrictions Per Provider Order: No     Mobility Bed Mobility               General bed mobility comments: oob on arrival    Transfers Overall transfer level: Needs assistance   Transfers: Sit to/from Stand Sit to Stand: Supervision                  Balance                                           ADL either performed or assessed with clinical judgement   ADL Overall ADL's : Needs assistance/impaired Eating/Feeding: Modified independent   Grooming:  Wash/dry hands;Modified independent;Sitting               Lower Body Dressing: Contact guard assist Lower Body Dressing Details (indicate cue type and reason): pt able to figure 4 cross at this time no AE needs or education Toilet Transfer: Contact guard assist             General ADL Comments: pt static standing with eyes occluded able to reach head without any LOB showing static standing in shower is SUpervision level at this time. pt with HR 131 with sit<>Stand     Vision Patient Visual Report: No change from baseline       Perception         Praxis         Pertinent Vitals/Pain Pain Assessment Pain Assessment: No/denies pain     Extremity/Trunk Assessment Upper Extremity Assessment Upper Extremity Assessment: Overall WFL for tasks assessed   Lower Extremity Assessment Lower Extremity Assessment: Overall WFL for tasks assessed   Cervical / Trunk Assessment Cervical / Trunk Assessment: Normal   Communication Communication Communication: No apparent difficulties   Cognition Arousal: Alert Behavior During Therapy: WFL for tasks assessed/performed Cognition: No family/caregiver present to determine baseline  Following commands: Intact       Cueing  General Comments   Cueing Techniques: Verbal cues  girlfriend in rooma nd reports he will go to sisters house for recovery. pt normally is reading around with friends and goes to church 2 days per week. 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 ra   Exercises Exercises: Other exercises Other Exercises Other Exercises: energy conservation reviewed and packet provided along with 4p's   Shoulder Instructions      Home Living Family/patient expects to be discharged to:: Private residence Living Arrangements: Spouse/significant other Available Help at Discharge: Family;Available 24 hours/day Type of Home: House Home Access: Level entry     Home Layout: One  level     Bathroom Shower/Tub: Chief Strategy Officer: Standard Bathroom Accessibility: No   Home Equipment: None          Prior Functioning/Environment Prior Level of Function : Independent/Modified Independent;Driving             Mobility Comments: Denies falls, reports mobility without an AD. ADLs Comments: Ind    OT Problem List: Decreased activity tolerance;Decreased knowledge of precautions;Cardiopulmonary status limiting activity   OT Treatment/Interventions: Self-care/ADL training;Energy conservation;DME and/or AE instruction;Therapeutic activities;Patient/family education      OT Goals(Current goals can be found in the care plan section)   Acute Rehab OT Goals Patient Stated Goal: to be able to do things OT Goal Formulation: With patient Time For Goal Achievement: 01/11/24 Potential to Achieve Goals: Good   OT Frequency:  Min 2X/week    Co-evaluation              AM-PAC OT 6 Clicks Daily Activity     Outcome Measure Help from another person eating meals?: None Help from another person taking care of personal grooming?: None Help from another person toileting, which includes using toliet, bedpan, or urinal?: A Little Help from another person bathing (including washing, rinsing, drying)?: A Little Help from another person to put on and taking off regular upper body clothing?: None Help from another person to put on and taking off regular lower body clothing?: A Little 6 Click Score: 21   End of Session Nurse Communication: Mobility status;Precautions  Activity Tolerance: Patient tolerated treatment well Patient left: in chair;with call bell/phone within reach  OT Visit Diagnosis: Unsteadiness on feet (R26.81)                Time: 8898-8881 OT Time Calculation (min): 17 min Charges:  OT General Charges $OT Visit: 1 Visit OT Evaluation $OT Eval Moderate Complexity: 1 Mod   Brynn, OTR/L  Acute Rehabilitation Services Office:  [phone] .   Ely Molt 12/28/2023, 12:15 PM

## 2023-12-28 NOTE — Evaluation (Signed)
 Physical Therapy Evaluation Patient Details Name: Tyler Cohen. MRN: [id number] DOB: 31-Jan-1956 Today's Date: 12/28/2023  History of Present Illness  Pt is 68 yo presenting to [facility] on 9/22 due to palpitations and was found to be in afib RVR with HR in 140's. Pmh: HFrEF, PAF, hypothyroidism, thyroid  nodule, prostate cancer.  Clinical Impression  Pt is presenting at Mod I to CGA for functional mobility today. Currently supervision for sit to stand and CGA for transfers and gait. Pt has supportive family at home. Due to pt current functional status, home set up and available assistance at home no recommended skilled physical therapy services at this time on discharge from acute care hospital setting. Will continue to follow in acute setting in order to ensure that pt returns home with decreased risk for falls, injury, re-hospitalization and improved activity tolerance.          If plan is discharge home, recommend the following: Assist for transportation;Assistance with cooking/housework     Equipment Recommendations None recommended by PT     Functional Status Assessment Patient has had a recent decline in their functional status and demonstrates the ability to make significant improvements in function in a reasonable and predictable amount of time.     Precautions / Restrictions Precautions Precautions: Fall Recall of Precautions/Restrictions: Impaired Precaution/Restrictions Comments: CVP line, SWAN on R Restrictions Weight Bearing Restrictions Per Provider Order: No      Mobility  Bed Mobility Overal bed mobility: Modified Independent             General bed mobility comments: HOB elevated ~ 70 degrees to get to sitting EOB.    Transfers Overall transfer level: Needs assistance Equipment used: None Transfers: Sit to/from Stand, Bed to chair/wheelchair/BSC Sit to Stand: Supervision   Step pivot transfers: Contact guard assist       General transfer comment:  Supervision to CGA for sit to stand and transfers due to slightly impaired balance to help with stabilization. Pt denies dizziness.    Ambulation/Gait Ambulation/Gait assistance: Contact guard assist, +2 safety/equipment Gait Distance (Feet): 400 Feet Assistive device: None Gait Pattern/deviations: Step-through pattern, Staggering left, Decreased stride length Gait velocity: decreased Gait velocity interpretation: <1.8 ft/sec, indicate of risk for recurrent falls   General Gait Details: CGA for safety and stability. Pt demonstrates antalgic gait pattern but denies pain on the R stating he has always done that. Pt staggering to the L 2x with assist for balance. Two person assist for lines. Pt fatigued after gait.     Balance Overall balance assessment: Needs assistance Sitting-balance support: No upper extremity supported, Feet unsupported, Feet supported Sitting balance-Leahy Scale: Normal     Standing balance support: No upper extremity supported, During functional activity Standing balance-Leahy Scale: Fair Standing balance comment: 2x LOB toward the L, CGA for safety in standing.       Pertinent Vitals/Pain Pain Assessment Pain Assessment: 0-10 Pain Score: 9  Pain Location: R side; improved with mobility Pain Descriptors / Indicators: Aching Pain Intervention(s): Monitored during session    Home Living Family/patient expects to be discharged to:: Private residence Living Arrangements: Spouse/significant other Available Help at Discharge: Family;Available 24 hours/day (sister and girlfriend) Type of Home: House Home Access: Level entry       Home Layout: One level Home Equipment: None      Prior Function Prior Level of Function : Independent/Modified Independent;Driving             Mobility Comments: Denies falls, reports mobility  without an AD. ADLs Comments: Ind     Extremity/Trunk Assessment   Upper Extremity Assessment Upper Extremity Assessment:  Overall WFL for tasks assessed    Lower Extremity Assessment Lower Extremity Assessment: Overall WFL for tasks assessed    Cervical / Trunk Assessment Cervical / Trunk Assessment: Normal  Communication   Communication Communication: No apparent difficulties    Cognition Arousal: Alert Behavior During Therapy: WFL for tasks assessed/performed   PT - Cognitive impairments: No apparent impairments   Following commands: Intact       Cueing Cueing Techniques: Verbal cues     General Comments General comments (skin integrity, edema, etc.): girlfriend present in room during session. HR up to 154 bpm intermittently during gait, mostly staing between 130-140 bpm.        Assessment/Plan    PT Assessment Patient needs continued PT services  PT Problem List Cardiopulmonary status limiting activity;Decreased activity tolerance;Decreased balance;Decreased mobility       PT Treatment Interventions DME instruction;Balance training;Gait training;Functional mobility training;Therapeutic activities;Therapeutic exercise;Patient/family education    PT Goals (Current goals can be found in the Care Plan section)  Acute Rehab PT Goals Patient Stated Goal: To return to prior level of functioning. PT Goal Formulation: With patient Time For Goal Achievement: 01/11/24 Potential to Achieve Goals: Good    Frequency Min 2X/week     AM-PAC PT 6 Clicks Mobility  Outcome Measure Help needed turning from your back to your side while in a flat bed without using bedrails?: None Help needed moving from lying on your back to sitting on the side of a flat bed without using bedrails?: None Help needed moving to and from a bed to a chair (including a wheelchair)?: A Little Help needed standing up from a chair using your arms (e.g., wheelchair or bedside chair)?: A Little Help needed to walk in hospital room?: A Little Help needed climbing 3-5 steps with a railing? : A Little 6 Click Score: 20     End of Session Equipment Utilized During Treatment: Gait belt Activity Tolerance: Patient tolerated treatment well Patient left: in chair;with call bell/phone within reach;with family/visitor present Nurse Communication: Mobility status PT Visit Diagnosis: Unsteadiness on feet (R26.81);Other abnormalities of gait and mobility (R26.89)    Time: 9075-9046 PT Time Calculation (min) (ACUTE ONLY): 29 min   Charges:   PT Evaluation $PT Eval Low Complexity: 1 Low PT Treatments $Therapeutic Activity: 8-22 mins PT General Charges $$ ACUTE PT VISIT: 1 Visit        Dorothyann Maier, DPT, CLT  Acute Rehabilitation Services Office: [phone] (Secure chat preferred)   Dorothyann VEAR Maier 12/28/2023, 10:08 AM

## 2023-12-28 NOTE — Consult Note (Addendum)
 HEART AND VASCULAR CENTER   MULTIDISCIPLINARY HEART VALVE TEAM  Inpatient [facility] Consultation:   Patient ID: Tyler Cohen.; [id number]; 03-11-1956   Admit date: 12/26/2023 Date of Consult: 12/28/2023  Primary Care Provider: Vicci Duwaine SQUIBB, DO Primary Cardiologist: [facility] Primary Electrophysiologist:  none   Patient Profile:   Tyler Cohen. is a 68 y.o. male with a hx of chronic HFrEF with LVEF 20-25%, PAF on Eliquis , hyperthyroidism on methimazole , thyroid  nodule status post I-131 ablation, prostate cancer s/p XRT, renal mass, adrenal mass, pulmonary mass, former tobacco abuse, HTN, HLD, persistent atrial fib/flutter, osteo s/p L 2nd toe amputation (05/2023), non compliance and severe MR who is being seen today for the evaluation of severe functional MR at the request of Dr. Rolan.  History of Present Illness:   Tyler Cohen lives in [place] with his girlfriend.  His sister, [name], lives close by.  He retired in 2023 after working for [employer] for many years.  He remains active riding his bike and working out in the yard.  He drives a car and is able to take care of all of his own ADLs.  He is missing all of his teeth except for one.  He does not get regular dental care.  He was admitted to [facility] on 05/14/2021 with multifocal bronchopneumonia and thyroid  storm felt to possibly be related to toxic multinodular goiter. Noted to be in atrial flutter with RVR with new cardiomyopathy EF 25-30% and heart failure requiring diuresis. He was noted to have moderate MR at that time with a small mobile opacity noted on anterior and posterior mitral valve leaflets, unable to exclude valve vegetations vs other etiology/valve cord. He was ultimately transferred to [facility] for endocrinology assessment and had been treated there. Subsequently s/p 131 ablation and treated with methimazole .   Cardiac CT 08/20/21 showed no CAD, calcium  score 0 and felt to have NICM. Pulmonary nodule  noted.   He was in his usual state of health until early September when he developed URI-like symptoms with a runny nose, sore throat and dry cough.  He admits to taking copious amounts of Sudafed and not taking any of his prescribed medications.  He ultimately presented on 12/24/23 to [facility] with chest pain and palpitations and found to be in atrial flutter with RVR. Echo showed EF 20-25%, severe LAE, severe MR and mild to mod TR. He was treated with IV dilt/metoprolol  for rate control and developed hypotension and cardiogenic shock. Lactic acid 2.7, creat up to 1.4, mildly elevated LFTs. TSH < 0.1, free T4 2.3. Started back on methimazole . He was started on inotropic support.  He reported chest pain in the setting of a mildly elevated HS troponin (20, 20, 13). He was taken to the cath lab emergently for [facility] 12/25/23 on 0.25 milrinone  + 6 NE which showed no CAD, RA mean 12, PA 64/40, PCWP mean 34 w/ v waves to 56, Fick CI 1.6, TD CI 2.4, CPO 0.51 by Fick and 0.75 by TD, LVEDP 24. He was transferred to [facility] for management by the advanced CHF team and IV diuresis.   TEE/DCCV attempted 12/28/23 but had early return to atrial flutter. On heparin  gtt and amiodarone  gtt and in and out of atrial flutter (currently in sinus). AV nodals being avoided. Amiodarone  not felt to be a good long term medication with chronic thyroid  disease.   TEE showed mildly dilated left ventricle with severe systolic dysfunction, EF 20-25% with global hypokinesis. Normal RV size with  mild systolic dysfunction. Moderately dilated left atrium with no LA appendage thrombus. Normal right atrium. There was a small PFO by color doppler. Trivial TR. Trileaflet aortic valve with no stenosis and trivial regurgitation. Severe mitral regurgitation with PISA ERO 0.48 cm^2. MR appears functional with restriction of the posterior leaflet and malcoaptation. There was systolic flow reversal in the pulmonary vein doppler pattern.   He has been stabilized  on GDMT and taken off milrinone  and levophed . TD CI 2.22 and CO-OX 67% off milrinone  this morning. Diuretics on hold. BP currently 121/87 and he is feeling symptomatically much improved.   Structural heart is consulted for consideration of mTEER. His girlfreind is present in the room and sister on speaker phone. He is currently feeling much better after IV diuresis. He currently denies CP or SOB. No LE edema, orthopnea or PND. No dizziness or syncope. No blood in stool or urine. No palpitations. He does admit to not taking his medications for an unclear amount of time and does not have a good explanation as to why. He reports no financial barriers to this.     Past Medical History:  Diagnosis Date   Adrenal mass 2019   Aortic atherosclerosis    Arthritis    Atrial fibrillation (HCC)    a.) CHA2DS2VASc = 4 (age, CHF, HTN, vascular disease history);  b.) s/p DCCV (300 J x 1) 05/18/2021; c.) rate/rhythm maintained on oral digoxin ; chronically anticoagulated with apixaban    Benign neoplasm of sigmoid colon    BPH (benign prostatic hyperplasia)    Dental disease    Digital mucinous cyst of finger of right hand 2018   Elevated PSA    Hepatic cyst    Hepatic hemangioma    HFrEF (heart failure with reduced ejection fraction) (HCC)    a.) TTE 05/14/2021: EF 25-30%, glob HK, mod dil LV, RVE, PASP 39.8; b.) TTE 06/25/2021: EF 30%, LAE, triv AR/PR, mild TR, mod MR, G1DD   HLD (hyperlipidemia)    HTN (hypertension)    Hyperthyroidism 2019   Liver lesion 2018   Macrocytosis    Malnutrition of moderate degree 05/15/2021   Marijuana use    Multinodular goiter    NICM (nonischemic cardiomyopathy) (HCC)    a.) TTE 05/14/2021: EF 25-30%; b.) TTE 06/25/2021: EF 30%   Non compliance w medication regimen    On apixaban  therapy    Osteomyelitis of great toe (HCC) 05/03/2023   Peripheral vascular disease    Psoriasis 2017   Right lower lobe pulmonary nodule 08/20/2021   a.) cCTA 08/20/2021: measured 7  x 5 mm.   Thyroid  crisis or storm 05/13/2021   Tobacco abuse     Past Surgical History:  Procedure Laterality Date   AMPUTATION TOE Left 05/17/2023   Procedure: AMPUTATION TOE METATARSOPHALANGEAL JOINT;  Surgeon: Neill Boas, DPM;  Location: [facility] ORS;  Service: Orthopedics/Podiatry;  Laterality: Left;   ARTERIAL LINE INSERTION N/A 12/25/2023   Procedure: ARTERIAL LINE INSERTION;  Surgeon: Dylynn Ketner K, MD;  Location: [facility] INVASIVE CV LAB;  Service: Cardiovascular;  Laterality: N/A;   CARDIOVERSION N/A 12/27/2023   Procedure: CARDIOVERSION;  Surgeon: Rolan Ezra RAMAN, MD;  Location: [facility] INVASIVE CV LAB;  Service: Cardiovascular;  Laterality: N/A;   COLONOSCOPY WITH PROPOFOL  N/A 12/01/2015   Procedure: COLONOSCOPY WITH PROPOFOL ;  Surgeon: Rogelia Copping, MD;  Location: [facility] SURGERY CNTR;  Service: Endoscopy;  Laterality: N/A;   POLYPECTOMY  12/01/2015   Procedure: POLYPECTOMY;  Surgeon: Rogelia Copping, MD;  [facility] SUR[facility]ERY [facility];  Service: Endoscopy;;   PROSTATE BIOPSY N/A 05/25/2022   Procedure: PROSTATE BIOPSY;  Surgeon: Twylla [place]ia BROCKS, MD;  Location: [facility] ORS;  Service: Urology;  Laterality: N/A;   RIGHT/LEFT HEART CATH AND CORONARY ANGIOGRAPHY N/A 12/25/2023   Procedure: RIGHT/LEFT HEART CATH AND CORONARY ANGIOGRAPHY;  Surgeon: Wendel Lurena POUR, MD;  Location: [facility] INVASIVE CV LAB;  Service: Cardiovascular;  Laterality: N/A;   TEE WITH CARDIOVERSION N/A 05/18/2021   TRANSESOPHAGEAL ECHOCARDIOGRAM (CATH LAB) N/A 12/27/2023   Procedure: TRANSESOPHAGEAL ECHOCARDIOGRAM;  Surgeon: Rolan Ezra RAMAN, MD;  Location: [facility] INVASIVE CV LAB;  Service: Cardiovascular;  Laterality: N/A;   TRANSRECTAL ULTRASOUND N/A 05/25/2022   Procedure: TRANSRECTAL ULTRASOUND;  Surgeon: Twylla [place] BROCKS, MD;  Location: [facility] ORS;  Service: Urology;  Laterality: N/A;     Inpatient Medications: Scheduled Meds:  atorvastatin   20 mg Oral Daily   budesonide  (PULMICORT ) nebulizer solution  0.25 mg Nebulization BID    Chlorhexidine  Gluconate Cloth  6 each Topical Daily   digoxin   0.125 mg Oral Daily   insulin  aspart  0-9 Units Subcutaneous TID WC   [START ON 12/29/2023] losartan   25 mg Oral Daily   methimazole   15 mg Oral Daily   sodium chloride  flush  10-40 mL Intracatheter Q12H   sodium chloride  flush  3 mL Intravenous Q12H   spironolactone   25 mg Oral Daily   Continuous Infusions:  amiodarone  60 mg/hr (12/28/23 1516)   heparin  1,350 Units/hr (12/28/23 1400)   PRN Meds: acetaminophen , mouth rinse, sodium chloride  flush, sodium chloride  flush  Allergies:    Allergies  Allergen Reactions   Lactose Intolerance (Gi) Diarrhea and Other (See Comments)    Bloating, flatulence    Social History:   Social History   Socioeconomic History   Marital status: Married    Spouse name: Not on file   Number of children: Not on file   Years of education: Not on file   Highest education level: High school graduate  Occupational History   Not on file  Tobacco Use   Smoking status: Former    Current packs/day: 0.00    Average packs/day: 0.3 packs/day for 42.0 years (10.5 ttl pk-yrs)    Types: Cigars, Cigarettes    Start date: 05/18/1979    Quit date: 05/17/2021    Years since quitting: 2.6   Smokeless tobacco: Never  Vaping Use   Vaping status: Never Used  Substance and Sexual Activity   Alcohol use: No   Drug use: Yes    Types: Marijuana    Comment: pt states he smokes every once in a while   Sexual activity: Yes    Birth control/protection: None  Other Topics Concern   Not on file  Social History Narrative   Works part time.   Social Drivers of Corporate investment banker Strain: Low Risk  (05/23/2023)   Received from [facility]   Overall Financial Resource Strain (CARDIA)    Difficulty of Paying Living Expenses: Not hard at all  Food Insecurity: No Food Insecurity (12/26/2023)   Hunger Vital Sign    Worried About Running Out of Food in the Last Year: Never true    Ran  Out of Food in the Last Year: Never true  Transportation Needs: No Transportation Needs (12/26/2023)   PRAPARE - Administrator, Civil Service (Medical): No    Lack of Transportation (Non-Medical): No  Physical Activity: Insufficiently Active (01/12/2022)   Exercise Vital Sign    Days of  Exercise per Week: 3 days    Minutes of Exercise per Session: 30 min  Stress: Stress Concern Present (01/12/2022)   [employer] of Occupational Health - Occupational Stress Questionnaire    Feeling of Stress : To some extent  Social Connections: Moderately Integrated (12/26/2023)   Social Connection and Isolation Panel    Frequency of Communication with Friends and Family: Twice a week    Frequency of Social Gatherings with Friends and Family: Once a week    Attends Religious Services: More than 4 times per year    Active Member of [employer] or Organizations: Yes    Attends Engineer, structural: More than 4 times per year    Marital Status: Never married  Recent Concern: Social Connections - Moderately Isolated (12/24/2023)   Social Connection and Isolation Panel    Frequency of Communication with Friends and Family: Twice a week    Frequency of Social Gatherings with Friends and Family: Never    Attends Religious Services: More than 4 times per year    Active Member of [employer] or Organizations: Yes    Attends Engineer, structural: More than 4 times per year    Marital Status: Never married  Intimate Partner Violence: Not At Risk (12/26/2023)   Humiliation, Afraid, Rape, and Kick questionnaire    Fear of Current or Ex-Partner: No    Emotionally Abused: No    Physically Abused: No    Sexually Abused: No    Family History:   The patient's family history includes Diabetes in his mother; Emphysema in his father; Hyperlipidemia in his brother; Hypertension in his brother, father, and mother; Kidney disease in his mother; Sickle cell trait in his father and sister.  ROS:   Please see the history of present illness.  ROS  All other ROS reviewed and negative.     Physical Exam/Data:   Vitals:   12/28/23 1345 12/28/23 1400 12/28/23 1415 12/28/23 1430  BP:  121/87    Pulse: 90 92 92 83  Resp: (!) 22 (!) 23 18 (!) 22  Temp: 98.8 F (37.1 C) 99 F (37.2 C) 99 F (37.2 C) 98.6 F (37 C)  TempSrc:      SpO2: 96% 93% 99% 99%  Weight:      Height:        Intake/Output Summary (Last 24 hours) at 12/28/2023 1546 Last data filed at 12/28/2023 1400 Gross per 24 hour  Intake 1315.67 ml  Output 1975 ml  Net -659.33 ml   Filed Weights   12/26/23 0045 12/27/23 1100 12/28/23 0500  Weight: 76.5 kg 76.5 kg 78 kg   Body mass index is 22.69 kg/m.  General:  Thin African American man  HEENT: normal Neck: no JVD Cardiac:  normal S1, S2; RRR; 3/6 holosystolic murmur at apex.  Lungs:  clear to auscultation bilaterally, no wheezing, rhonchi or rales  Abd: soft, nontender, no hepatomegaly  Ext: no edema Musculoskeletal:  No deformities, BUE and BLE strength normal and equal Skin: warm and dry  Neuro:  CNs 2-12 intact, no focal abnormalities noted Psych:  Normal affect   EKG:  The EKG was personally reviewed and demonstrates:  atrial flutter with LVH, PVCS, HR 100 bpm Telemetry:  Telemetry was personally reviewed and demonstrates:  in and out of atrial flutter but currently in sinus.   Laboratory Data:  Chemistry Recent Labs  Lab 12/26/23 0201 12/26/23 0209 12/26/23 1459 12/27/23 0500 12/28/23 0520  NA 139   < >  140 136 137  K 4.1   < > 4.5 3.7 3.9  CL 101  --   --  99 103  CO2 26  --   --  27 26  GLUCOSE 132*  --   --  93 102*  BUN 26*  --   --  16 11  CREATININE 1.42*  --   --  1.07 1.10  CALCIUM  8.3*  --   --  8.1* 8.3*  GFRNONAA 54*  --   --  >60 >60  ANIONGAP 12  --   --  10 8   < > = values in this interval not displayed.    Recent Labs  Lab 12/26/23 0201  PROT 5.9*  ALBUMIN 2.9*  AST 60*  ALT 74*  ALKPHOS 52  BILITOT 1.3*    Hematology Recent Labs  Lab 12/24/23 1111 12/25/23 2153 12/26/23 1459 12/27/23 1108 12/28/23 0520  WBC 5.7  --   --  11.3* 7.9  RBC 4.36  --   --  4.05* 3.86*  HGB 13.9   < > 13.9 12.8* 12.3*  HCT 41.7   < > 41.0 38.2* 36.2*  MCV 95.6  --   --  94.3 93.8  MCH 31.9  --   --  31.6 31.9  MCHC 33.3  --   --  33.5 34.0  RDW 12.3  --   --  12.0 12.0  PLT 150  --   --  138* 120*   < > = values in this interval not displayed.   Cardiac EnzymesNo results for input(s): TROPONINI in the last 168 hours. No results for input(s): TROPIPOC in the last 168 hours.  BNP Recent Labs  Lab 12/24/23 1111  BNP 818.6*    DDimer  Recent Labs  Lab 12/24/23 1111  DDIMER 0.43    Radiology/Studies:  DG CHEST PORT 1 VIEW Result Date: 12/27/2023 EXAM: 1 VIEW(S) XRAY OF THE CHEST 12/27/2023 11:19:00 AM COMPARISON: 12/26/2023 CLINICAL HISTORY: Hemoptysis FINDINGS: LINES, TUBES AND DEVICES: Left IJ central line stable in position. Right IJ Swan-Ganz catheter stable in position with tip projecting over right upper lobe pulmonary artery branch. LUNGS AND PLEURA: No focal pulmonary opacity. No pulmonary edema. No pleural effusion. No pneumothorax. HEART AND MEDIASTINUM: No acute abnormality of the cardiac and mediastinal silhouettes. BONES AND SOFT TISSUES: No acute osseous abnormality. IMPRESSION: 1. Right IJ Swan-Ganz catheter stable in position with tip projecting over right upper lobe pulmonary artery branch, suggest retracting 4-5 cm. 2. Stable exam with no pulmonary edema. Electronically signed by: Selinda Blue MD 12/27/2023 03:15 PM EDT RP Workstation: [id number]   EP STUDY Result Date: 12/27/2023 See surgical note for result.  DG CHEST PORT 1 VIEW Result Date: 12/26/2023 CLINICAL DATA:  [id number] Swan neck deformity of cervical spine [id number] EXAM: PORTABLE CHEST 1 VIEW COMPARISON:  12/26/2023 FINDINGS: Swan-Ganz catheter tip has migrated into the right upper lobe pulmonary artery. Left central line  tip in the SVC. Heart is upper limits normal in size. Mediastinal contours within normal limits. Stable patchy right perihilar opacities. No focal opacity on the left. No effusions. No pneumothorax. IMPRESSION: Swan-Ganz catheter tip now in the right upper lobe pulmonary artery. Stable patchy right perihilar opacities. Electronically Signed   By: Franky Crease M.D.   On: 12/26/2023 20:29   DG CHEST PORT 1 VIEW Result Date: 12/26/2023 CLINICAL DATA:  [id number] with acute on chronic heart failure with reduced ejection fraction. EXAM: PORTABLE CHEST 1 VIEW COMPARISON:  Portable chest  yesterday at 9:19 p.m. FINDINGS: 5:43 a.m. left IJ central line again terminates at the superior cavoatrial junction. A right IJ Swan-Ganz line has been added, terminating in the right lower lobe artery. Overlapping defibrillator pads superimposing over the lower left chest. Mild cardiomegaly. Perihilar vascular prominence right-greater-than-left is again noted with patchy right perihilar opacities, in keeping with edema versus pneumonia. No subpleural edema is seen and no substantial pleural effusion. The remaining lungs are clear. The mediastinum is stable. No new osseous findings. No pneumothorax. IMPRESSION: 1. Right IJ Swan-Ganz line terminates in the right lower lobe artery. 2. Stable left IJ central line. 3. Stable cardiomegaly and perihilar vascular prominence. 4. Patchy right perihilar opacities, in keeping with edema versus pneumonia. Electronically Signed   By: Francis Quam M.D.   On: 12/26/2023 05:54   CARDIAC CATHETERIZATION Addendum Date: 12/25/2023 1.  Normal right dominant circulation with no obstructive coronary artery disease. 2.  Metabolic acidosis treated with 3 amps of bicarbonate with subsequent normalization of ABG. 3.  With the patient on milrinone  0.25 mcg/kilogram/minute and norepinephrine  6 mcg/min, the following hemodynamically was obtained;  Fick cardiac output of 3.2 L/min and Fick cardiac index of 1.6  L/min/m  Thermodilution cardiac output of 4.8 L/min/m with thermodilution cardiac index of 2.4 L/min/m   Right atrial pressure of mean of 12 with V waves to 15 mmHg   PA pressure of 64/40 with a mean of 37 mmHg   Wedge pressure mean of 34 mmHg with V waves to 56 mmHg   PVR of 0.92 by Fick and 0.62 by TD   PA pulsatility index of 2   CPO of 0.51 by Fick and 0.75 by TD 4.  LVEDP of 24 mmHg 5.  Successful left radial arterial line placement. 6.  Capacious iliofemoral vessels bilaterally Summary: Given improving lactate (2.7 > 1.4), ABG, and CPO > 0.6, mechanical circulatory support was not pursued.  Iliofemoral vessels are approachable if mechanical circulatory support is needed in the future.  The results were discussed with Dr. Sabarwal who will accept the patient in transfer.  Continue milrinone  0.25 mcg/kg/min, and norepinephrine  to oh MAP goal of 70 mmHg.  Trend co-oximetry and lactate.  Discussed with sister [name] Dixons by phone [phone])  Result Date: 12/25/2023 1.  Normal right dominant circulation with no obstructive coronary artery disease. 2.  Metabolic acidosis treated with 3 amps of bicarbonate with subsequent normalization of ABG. 3.  With the patient on milrinone  0.25 mcg/kilogram/minute and norepinephrine  6 mcg/min, the following hemodynamically was obtained;  Fick cardiac output of 3.2 L/min and Fick cardiac index of 1.6 L/min/m  Thermodilution cardiac output of 4.8 L/min/m with thermodilution cardiac index of 2.4 L/min/m   Right atrial pressure of mean of 12 with V waves to 15 mmHg   PA pressure of 64/40 with a mean of 37 mmHg   Wedge pressure mean of 34 mmHg with V waves to 56 mmHg   PVR of 0.92 by Fick and 0.62 by TD   PA pulsatility index of 2   CPO of 0.51 by Fick and 0.75 by TD 4.  LVEDP of 24 mmHg 5.  Capacious iliofemoral vessels bilaterally Summary: Given improving lactate (2.7 > 1.4), ABG, and CPO > 0.6, mechanical circulatory support was not pursued.  Iliofemoral vessels are  approachable if mechanical circulatory support is needed in the future.  The results were discussed with Dr. Sabarwal who will accept the patient in transfer.  Continue milrinone  0.25 mcg/kg/min, and norepinephrine  to oh  MAP goal of 70 mmHg.  Trend co-oximetry and lactate.  Discussed with sister [name] Dixons by phone [phone])   DG Chest Port 1 View Result Date: 12/25/2023 CLINICAL DATA:  Central line placement. EXAM: PORTABLE CHEST 1 VIEW COMPARISON:  Chest x-ray 12/25/2023. FINDINGS: There is a new left-sided central venous catheter with distal tip in the SVC. There is no pneumothorax. There are patchy airspace opacities in the central right lung similar to prior. The heart is enlarged, unchanged. No pleural effusion. IMPRESSION: 1. New left-sided central venous catheter with distal tip in the SVC. No pneumothorax. 2. Patchy airspace opacities in the central right lung similar to prior. Electronically Signed   By: Greig Pique M.D.   On: 12/25/2023 21:30   ECHOCARDIOGRAM COMPLETE Result Date: 12/25/2023    ECHOCARDIOGRAM REPORT   Patient Name:   ROOK MAUE Date of Exam: 12/25/2023 Medical Rec #:  [id number]       Height:       73.0 in Accession #:    [id number]      Weight:       165.0 lb Date of Birth:  October 21, 1955       BSA:          1.983 m Patient Age:    68 years        BP:           107/80 mmHg Patient Gender: M               HR:           119 bpm. Exam Location:  [facility] Procedure: 2D Echo, 3D Echo, Cardiac Doppler, Color Doppler and Strain Analysis            (Both Spectral and Color Flow Doppler were utilized during            procedure). Indications:     R07.89 Other chest pain; R07.9* Chest pain, unspecified  History:         Patient has prior history of Echocardiogram examinations, most                  recent 05/14/2021. Signs/Symptoms:Chest Pain.  Sonographer:     Doyal Point MHA, BS, RDCS Referring Phys:  [id number] CORT ONEIDA MANA Diagnosing Phys: Annalee Custovic IMPRESSIONS  1. Left  ventricular ejection fraction, by estimation, is 20 to 25%. Left ventricular ejection fraction by PLAX is 21 %. The left ventricle has severely decreased function. The left ventricle demonstrates global hypokinesis. Left ventricular diastolic parameters are indeterminate.  2. Right ventricular systolic function is normal. The right ventricular size is normal.  3. Left atrial size was severely dilated.  4. The mitral valve is abnormal. Severe mitral valve regurgitation. No evidence of mitral stenosis.  5. Tricuspid valve regurgitation is mild to moderate.  6. The aortic valve is normal in structure. Aortic valve regurgitation is trivial. No aortic stenosis is present.  7. The inferior vena cava is normal in size with greater than 50% respiratory variability, suggesting right atrial pressure of 3 mmHg. FINDINGS  Left Ventricle: Left ventricular ejection fraction, by estimation, is 20 to 25%. Left ventricular ejection fraction by PLAX is 21 %. The left ventricle has severely decreased function. The left ventricle demonstrates global hypokinesis. The left ventricular internal cavity size was normal in size. There is no left ventricular hypertrophy. Left ventricular diastolic parameters are indeterminate. Right Ventricle: The right ventricular size is normal. No increase in right ventricular wall thickness. Right ventricular systolic  function is normal. Left Atrium: Left atrial size was severely dilated. Right Atrium: Right atrial size was normal in size. Pericardium: There is no evidence of pericardial effusion. Mitral Valve: The mitral valve is abnormal. Severe mitral valve regurgitation. No evidence of mitral valve stenosis. Tricuspid Valve: The tricuspid valve is normal in structure. Tricuspid valve regurgitation is mild to moderate. Aortic Valve: The aortic valve is normal in structure. Aortic valve regurgitation is trivial. No aortic stenosis is present. Aortic valve mean gradient measures 3.0 mmHg. Aortic valve  peak gradient measures 5.0 mmHg. Aortic valve area, by VTI measures 2.27 cm. Pulmonic Valve: The pulmonic valve was normal in structure. Pulmonic valve regurgitation is mild. Aorta: The aortic root is normal in size and structure. Venous: The inferior vena cava is normal in size with greater than 50% respiratory variability, suggesting right atrial pressure of 3 mmHg. IAS/Shunts: No atrial level shunt detected by color flow Doppler.  LEFT VENTRICLE PLAX 2D LV EF:         Left ventricular ejection fraction by PLAX is 21 %. LVIDd:         6.00 cm LVIDs:         5.40 cm LV PW:         1.10 cm LV IVS:        0.90 cm LVOT diam:     2.30 cm LV SV:         39 LV SV Index:   20 LVOT Area:     4.15 cm  RIGHT VENTRICLE RV Basal diam:  4.25 cm RV Mid diam:    3.40 cm RV S prime:     14.90 cm/s TAPSE (M-mode): 1.7 cm LEFT ATRIUM              Index        RIGHT ATRIUM           Index LA diam:        3.80 cm  1.92 cm/m   RA Area:     20.40 cm LA Vol (A2C):   163.0 ml 82.21 ml/m  RA Volume:   65.10 ml  32.83 ml/m LA Vol (A4C):   147.0 ml 74.14 ml/m LA Biplane Vol: 161.0 ml 81.20 ml/m  AORTIC VALVE AV Area (Vmax):    2.47 cm AV Area (Vmean):   2.34 cm AV Area (VTI):     2.27 cm AV Vmax:           112.00 cm/s AV Vmean:          79.100 cm/s AV VTI:            0.171 m AV Peak Grad:      5.0 mmHg AV Mean Grad:      3.0 mmHg LVOT Vmax:         66.60 cm/s LVOT Vmean:        44.500 cm/s LVOT VTI:          0.094 m LVOT/AV VTI ratio: 0.55  AORTA Ao Root diam: 3.90 cm MR Peak grad:    124.5 mmHg MR Mean grad:    83.0 mmHg    SHUNTS MR Vmax:         558.00 cm/s  Systemic VTI:  0.09 m MR Vmean:        431.0 cm/s   Systemic Diam: 2.30 cm MR PISA:         7.60 cm MR PISA Eff ROA: 52 mm MR PISA Radius:  1.10  cm Annalee Casa Electronically signed by Annalee Casa Signature Date/Time: 12/25/2023/9:42:18 AM    Final    DG Chest 1 View Result Date: 12/25/2023 CLINICAL DATA:  CHF. EXAM: CHEST  1 VIEW COMPARISON:  12/24/2023  FINDINGS: The lungs are clear without focal pneumonia, edema, pneumothorax or pleural effusion. Interstitial markings are diffusely coarsened with chronic features. Cardiopericardial silhouette is at upper limits of normal for size. No acute bony abnormality. Telemetry leads overlie the chest. IMPRESSION: Chronic interstitial coarsening without acute cardiopulmonary findings. Electronically Signed   By: Camellia Candle M.D.   On: 12/25/2023 07:32    [place] Cardiomyopathy Questionnaire     12/28/2023    3:35 PM  KCCQ-12  1 a. Ability to shower/bathe Quite a bit limited  1 b. Ability to walk 1 block Extremely limited  1 c. Ability to hurry/jog Other, Did not do  2. Edema feet/ankles/legs Never over the past 2 weeks  3. Limited by fatigue All of the time  4. Limited by dyspnea All of the time  5. Sitting up / on 3+ pillows Every night  6. Limited enjoyment of life Extremely limited  7. Rest of life w/ symptoms Not at all satisfied  8 a. Participation in hobbies Severely limited  8 b. Participation in chores Severely limited  8 c. Visiting family/friends Severely limited      __________________________    STS Risk Calculator:  Procedure Type: Isolated MVR Perioperative Outcome Estimate % Operative Mortality 5.46% Morbidity & Mortality 33.1% Stroke 2.03% Renal Failure 4.38% Reoperation 8.98% Prolonged Ventilation 25.1% Deep Sternal Wound Infection 0.056% Long Hospital Stay (>14 days) 12.5% Short Hospital Stay (<6 days)* 12.5%  Procedure Type: Isolated MVr Perioperative Outcome Estimate % Operative Mortality 8.5% Morbidity & Mortality 27.2% Stroke 1.98% Renal Failure 3.38% Reoperation 8.21% Prolonged Ventilation 19.8% Deep Sternal Wound Infection 0.037% Long Hospital Stay (>14 days) 15.3% Short Hospital Stay (<6 days)* 17.1%   Assessment and Plan:   Jacquis Paxton. is a 68 y.o. male with symptoms of severe, stage D functional mitral regurgitation with NYHA  Class IV symptoms currently admitted for acute systolic CHF and atrial flutter with RVR in the setting of uncontrolled hyperthyroidism, Sudefed use and medication non compliance. Attempts to rate control atrial flutter with AV nodal blocking agents precipitated cardiogenic shock requiring inotropic support and transfer to [facility]. He has now been stabilized with diuresis and GDMT and taken off milrinone . TD CI 2.22 and CO-OX 67% off milrinone  this morning. Diuretics on hold. BP currently 121/87 and he is feeling symptomatically much improved.    Echo 12/25/23 showed EF 20-25%, normal RV function, severe LAE, mild to mod TR and severe MR.    [facility] 12/25/23 on 0.25 milrinone  + 6 NE which showed no CAD, RA mean 12, PA 64/40, PCWP mean 34 w/ v waves to 56, Fick CI 1.6, TD CI 2.4, CPO 0.51 by Fick and 0.75 by TD, LVEDP 24.    TEE 12/27/23 showed mildly dilated left ventricle with severe systolic dysfunction, EF 20-25% with global hypokinesis. Normal RV size with mild systolic dysfunction. Moderately dilated left atrium with no LA appendage thrombus. Normal right atrium. There was a small PFO by color doppler. Trivial TR. Trileaflet aortic valve with no stenosis and trivial regurgitation. Severe mitral regurgitation with PISA ERO 0.48 cm^2. MR appears functional with restriction of the posterior leaflet and malcoaptation. There was systolic flow reversal in the pulmonary vein doppler pattern.   I have reviewed the natural history of mitral  regurgitation with the patient. We have discussed the limitations of medical therapy and the poor prognosis associated with symptomatic mitral regurgitation. We have also reviewed potential treatment options, including palliative medical therapy, conventional surgical mitral valve repair or replacement, and percutaneous mitral valve repair with MitraClip. We discussed treatment options in the context of this patient's specific comorbid medical conditions.    The patient's predicted  risk of mortality with conventional mitral valve replacement/repair is 5.46 % / 8.5 % respectively,  primarily based on age, cardiogenic shock, acute CHF, atrial flutter, low BMI, PAD and acute CHF. Other significant comorbid conditions medical non compliance and untreated hyperthyroidism.   The patient clearly has severe functional mitral regurgitation in the setting of NICM. He was doing quite well until this month when he developed a URI and started taking significant amounts of Sudafed nasal decongestants and stopping his methimazole . He also admits to not taking any medications for an unknown period of time. This was confirmed by both his girlfriend who was present in the room and his sister, [name], who was on speaker phone.   He technically has not failed GDMT since he has never been complaint with medications. He understands and admits that this has contributed to his medical problems and current admission. He is willing to take his meds, but he asks that they be delivered in blister packs. It is possible that appropriate use of CHF GDMT and control of his hyperthyroidism and atrial flutter may lead to an improvement in heart function and mitral regurgitation. I would favor discharge home with paramedicine to help with compliance and follow up with an echocardiogram in 6-12 weeks to reassess heart function and mitral regurgitation before pursuing mTEER.    Dr. Wendel to review and make final recommendations.      Signed, Lamarr Hummer, PA-C  12/28/2023 3:46 PM   ATTENDING ATTESTATION:  After conducting a review of all available clinical information with the care team, interviewing the patient, and performing a physical exam, I agree with the findings and plan described in this note.   GEN: No acute distress, AO x 3 HEENT: Right IJ Swan in place  Cardiac: RRR, with 2 out of 6 holosystolic murmur best heard at axilla Respiratory: Clear to auscultation bilaterally. GI: Soft, nontender,  non-distended  MS: No edema; No deformity. Neuro:  Nonfocal  Vasc:  +2 radial pulses, warm and well-perfused  The patient is a 68 year old male with a history of chronic systolic heart failure with a nonischemic cardiomyopathy, paroxysmal atrial fibrillation on Eliquis , hypothyroidism previously on methimazole  with a history of iodine 131 ablation, prostate cancer status post radiation therapy, and osteomyelitis with left second toe amputation who presented to [facility] hospital with rapid atrial fibrillation.  He was resistant to cardioversion.  He was treated with rate controlling agents.  He developed cardiogenic shock and was taken to the cardiac catheterization laboratory.  He was found to have no coronary artery disease and was treated with inotropes and pressors.  He was transferred to [facility].  He has been medically stabilized.  He underwent a TEE which I reviewed which demonstrated severe functional mitral regurgitation.  We are requested for an opinion regarding mitral transcatheter edge-to-edge repair.  While the patient looks to have anatomy amenable to mitral transcatheter edge-to-edge repair I think it is very important that the patient be treated with medical therapy first to see if the need for this procedure can be obviated.  It is clear that the patient has  been noncompliant with medical therapy for some time.  This therefore makes a long-term durable outcome after edge-to-edge repair less likely as he would be at risk for adverse ventricular remodeling regardless of the acute procedure result that we may be able obtain to obtain.  I think in this particular patient he needs to demonstrate dedication to regular medical follow-up and medical compliance.  There was a question about whether he can remain in normal sinus rhythm.  He is currently on IV amiodarone .  Hopefully this can be achieved now that he has been loaded with a few days of amiodarone  after reversing his  cardiogenic shock and volume overload medically.  I am hopeful that he can be medically stabilized and discharged home to follow-up with advanced heart failure and structural heart disease as an outpatient.  Certainly if he deteriorates we may be pushed to pursue mitral transcatheter edge-to-edge repair.  I discussed with patient and his family members were present in the room and on the phone.  They understand and agree with this tentative plan.  I discussed with Dr. Rolan as well.  Will follow along with you.  Lurena Red, MD Pager [phone]

## 2023-12-28 NOTE — Progress Notes (Signed)
 PHARMACY - ANTICOAGULATION CONSULT NOTE  Pharmacy Consult for heparin  Indication: atrial fibrillation  Allergies  Allergen Reactions   Lactose Intolerance (Gi) Diarrhea and Other (See Comments)    Bloating, flatulence    Patient Measurements: Height: 6' 1 (185.4 cm) Weight: 78 kg (171 lb 15.3 oz) IBW/kg (Calculated) : 79.9 HEPARIN  DW (KG): 76.5  Vital Signs: Temp: 98.6 F (37 C) (09/24 1015) Temp Source: Oral (09/24 0800) BP: 124/92 (09/24 1000) Pulse Rate: 90 (09/24 1015)  Labs: Recent Labs    12/25/23 1958 12/25/23 2027 12/26/23 0201 12/26/23 0209 12/26/23 0900 12/26/23 1459 12/26/23 1830 12/27/23 0500 12/27/23 1108 12/28/23 0520  HGB  --    < >  --    < >  --  13.9  --   --  12.8* 12.3*  HCT  --    < >  --    < >  --  41.0  --   --  38.2* 36.2*  PLT  --   --   --   --   --   --   --   --  138* 120*  APTT  --    < >  --   --  48*  --  84* 76*  --   --   LABPROT  --   --  19.4*  --   --   --   --   --   --   --   INR  --   --  1.5*  --   --   --   --   --   --   --   HEPARINUNFRC  --   --   --   --  >1.10*  --   --  0.59  --  0.31  CREATININE  --   --  1.42*  --   --   --   --  1.07  --  1.10  TROPONINIHS 13  --   --   --   --   --   --   --   --   --    < > = values in this interval not displayed.    Estimated Creatinine Clearance: 70.9 mL/min (by C-G formula based on SCr of 1.1 mg/dL).  Assessment: Tyler Cohen. is a 68 y.o. male with medical history including PAF on Eliquis . Patient presented with chest pain and SOB. Patient experiencing hypotension and possibly developing shock. Pharmacy has been consulted to transition patient to heparin .  Heparin  level 0.31 is therapeutic with heparin  running at 1350 units/hr. Hgb (12.3) and PLTs (120) are stable. Per RN, no report of pauses, issues with the line, or signs of bleeding.    Goal of Therapy:  Heparin  level 0.3-0.7 units/ml Monitor platelets by anticoagulation protocol: Yes   Plan:  Continue  heparin  at 1350 units/h Monitor daily heparin  level, CBC, and signs/symptoms of bleeding F/u plan for transition to oral [facility]  Thank you for allowing pharmacy to be a part of this patient's care.   Nidia Schaffer, PharmD PGY2 Cardiology Pharmacy Resident  Please check AMION for all [facility] Pharmacy phone numbers After 10:00 PM, call Main Pharmacy [phone] 12/28/2023 10:48 AM

## 2023-12-29 ENCOUNTER — Telehealth (HOSPITAL_COMMUNITY): Payer: Self-pay | Admitting: Pharmacy Technician

## 2023-12-29 ENCOUNTER — Other Ambulatory Visit (HOSPITAL_COMMUNITY): Payer: Self-pay

## 2023-12-29 ENCOUNTER — Other Ambulatory Visit: Payer: Self-pay | Admitting: Family Medicine

## 2023-12-29 DIAGNOSIS — R570 Cardiogenic shock: Secondary | ICD-10-CM | POA: Diagnosis not present

## 2023-12-29 LAB — BASIC METABOLIC PANEL WITH GFR
Anion gap: 9 (ref 5–15)
BUN: 7 mg/dL — ABNORMAL LOW (ref 8–23)
CO2: 25 mmol/L (ref 22–32)
Calcium: 8.4 mg/dL — ABNORMAL LOW (ref 8.9–10.3)
Chloride: 102 mmol/L (ref 98–111)
Creatinine, Ser: 1.02 mg/dL (ref 0.61–1.24)
GFR, Estimated: >60 mL/min (ref >60)
Glucose, Bld: 108 mg/dL — ABNORMAL HIGH (ref 70–99)
Potassium: 4.1 mmol/L (ref 3.5–5.1)
Sodium: 136 mmol/L (ref 135–145)

## 2023-12-29 LAB — CBC
HCT: 36.8 % — ABNORMAL LOW (ref 39.0–52.0)
Hemoglobin: 12.7 g/dL — ABNORMAL LOW (ref 13.0–17.0)
MCH: 32 pg (ref 26.0–34.0)
MCHC: 34.5 g/dL (ref 30.0–36.0)
MCV: 92.7 fL (ref 80.0–100.0)
Platelets: 125 10*3/uL — ABNORMAL LOW (ref 150–400)
RBC: 3.97 MIL/uL — ABNORMAL LOW (ref 4.22–5.81)
RDW: 12 % (ref 11.5–15.5)
WBC: 8.3 10*3/uL (ref 4.0–10.5)
nRBC: 0 % (ref 0.0–0.2)

## 2023-12-29 LAB — GLUCOSE, CAPILLARY
Glucose-Capillary: 97 mg/dL (ref 70–99)
Glucose-Capillary: 121 mg/dL — ABNORMAL HIGH (ref 70–99)
Glucose-Capillary: 111 mg/dL — ABNORMAL HIGH (ref 70–99)
Glucose-Capillary: 121 mg/dL — ABNORMAL HIGH (ref 70–99)

## 2023-12-29 LAB — COOXEMETRY PANEL
Carboxyhemoglobin: 1 % (ref 0.5–1.5)
Methemoglobin: <0.7 % (ref 0.0–1.5)
O2 Saturation: 58.8 %
Total hemoglobin: 14.1 g/dL (ref 12.0–16.0)

## 2023-12-29 LAB — MAGNESIUM: Magnesium: 1.9 mg/dL (ref 1.7–2.4)

## 2023-12-29 LAB — HEPARIN LEVEL (UNFRACTIONATED): Heparin Unfractionated: 0.44 [IU]/mL (ref 0.30–0.70)

## 2023-12-29 MED ORDER — FUROSEMIDE 10 MG/ML IJ SOLN
80.0000 mg | Freq: Once | INTRAMUSCULAR | Status: AC
  Administered 2023-12-29: 80 mg via INTRAVENOUS
  Filled 2023-12-29: qty 8

## 2023-12-29 MED ORDER — APIXABAN 5 MG PO TABS
5.0000 mg | ORAL_TABLET | Freq: Two times a day (BID) | ORAL | Status: DC
  Administered 2023-12-29 – 2024-01-01 (×7): 5 mg via ORAL
  Filled 2023-12-29 (×7): qty 1

## 2023-12-29 MED ORDER — MAGNESIUM SULFATE 2 GM/50ML IV SOLN
2.0000 g | Freq: Once | INTRAVENOUS | Status: AC
  Administered 2023-12-29: 2 g via INTRAVENOUS
  Filled 2023-12-29: qty 50

## 2023-12-29 MED ORDER — DAPAGLIFLOZIN PROPANEDIOL 10 MG PO TABS
10.0000 mg | ORAL_TABLET | Freq: Every day | ORAL | Status: DC
  Administered 2023-12-29 – 2024-01-01 (×4): 10 mg via ORAL
  Filled 2023-12-29 (×4): qty 1

## 2023-12-29 MED ORDER — SACUBITRIL-VALSARTAN 24-26 MG PO TABS
1.0000 | ORAL_TABLET | Freq: Two times a day (BID) | ORAL | Status: DC
  Administered 2023-12-29 – 2023-12-30 (×4): 1 via ORAL
  Filled 2023-12-29 (×5): qty 1

## 2023-12-29 MED ORDER — GUAIFENESIN-DM 100-10 MG/5ML PO SYRP
5.0000 mL | ORAL_SOLUTION | ORAL | Status: DC | PRN
  Administered 2023-12-29 – 2023-12-31 (×3): 5 mL via ORAL
  Filled 2023-12-29 (×3): qty 5

## 2023-12-29 NOTE — Progress Notes (Signed)
 PHARMACY - ANTICOAGULATION CONSULT NOTE  Pharmacy Consult for heparin  > apixaban  Indication: atrial fibrillation  Allergies  Allergen Reactions   Lactose Intolerance (Gi) Diarrhea and Other (See Comments)    Bloating, flatulence    Patient Measurements: Height: 6' 1 (185.4 cm) Weight: 75.8 kg (167 lb 1.7 oz) IBW/kg (Calculated) : 79.9 HEPARIN  DW (KG): 76.5  Vital Signs: Temp: 98.8 F (37.1 C) (09/25 0830) Temp Source: Core (Comment) (09/25 0800) BP: 116/81 (09/25 0800) Pulse Rate: 100 (09/25 0830)  Labs: Recent Labs    12/26/23 1830 12/27/23 0500 12/27/23 1108 12/28/23 0520 12/29/23 0446  HGB  --   --  12.8* 12.3* 12.7*  HCT  --   --  38.2* 36.2* 36.8*  PLT  --   --  138* 120* 125*  APTT 84* 76*  --   --   --   HEPARINUNFRC  --  0.59  --  0.31 0.44  CREATININE  --  1.07  --  1.10 1.02    Estimated Creatinine Clearance: 74.3 mL/min (by C-G formula based on SCr of 1.02 mg/dL).  Assessment: Avon Mergenthaler. is a 68 y.o. male with medical history including PAF on Eliquis . Patient presented with chest pain and SOB. Patient experiencing hypotension and possibly developing shock. Pharmacy has been consulted to transition patient to heparin .  Heparin  level 0.44 is therapeutic with heparin  running at 1350 units/hr. Hgb (12.3) and PLTs (120) are stable. Per RN, no report of pauses, issues with the line, or signs of bleeding.  No additional planned procedures this admit will convert to apixaban  Wt > 60k age < 80y Cr < 1.5  Goal of Therapy:  Heparin  level 0.3-0.7 units/ml Monitor platelets by anticoagulation protocol: Yes   Plan:  Stop heparin  drip  Apixaban  5mg  BID Monitor s/s bleedng   Olam Chalk Pharm.D. CPP, BCPS Clinical Pharmacist [phone] 12/29/2023 9:20 AM    Please check AMION for all [facility] Pharm[facility] phone numbers After 10:00 PM, call Main Pharmacy [phone] 12/29/2023 9:18 AM

## 2023-12-29 NOTE — Plan of Care (Signed)

## 2023-12-29 NOTE — TOC Progression Note (Signed)
 Transition of Care (TOC) - Progression Note    Patient Details  Name: Tyler Cohen. MRN: [id number] Date of Birth: 1955/09/26  Transition of Care [facility]) CM/SW Contact  Justina Delcia Czar, RN Phone Number: [phone] 12/29/2023, 11:31 AM  Clinical Narrative:     Inpatient CM spoke to pt at bedside. States he will be staying with sister after dc, his home has stairs. Lives at home with SO. Pt states he was independent pta. Drives to appts. Sister will assist as needed. Has Cane, RW and scale at home for daily weights. Provided pt with Living Better with HF booklet.  Referral to HF Paramedicine. Sent to HF CSW, Jenna to review.  Will continue to follow for dc needs.   Will arrange hospital follow up appt with PCP at dc.   Expected Discharge Plan: Home/Self Care Barriers to Discharge: Continued Medical Work up               Expected Discharge Plan and Services In-house Referral: NA Discharge Planning Services: CM Consult Post Acute Care Choice: NA Living arrangements for the past 2 months: Single Family Home Expected Discharge Date: 12/26/23                 DME Agency: NA                   Social Drivers of Health (SDOH) Interventions SDOH Screenings   Food Insecurity: No Food Insecurity (12/26/2023)  Housing: Low Risk  (12/26/2023)  Transportation Needs: No Transportation Needs (12/26/2023)  Utilities: Not At Risk (12/26/2023)  Alcohol Screen: Low Risk  (01/12/2022)  Depression (PHQ2-9): Low Risk  (06/20/2023)  Financial Resource Strain: Low Risk  (05/23/2023)   Received from [facility]  Physical Activity: Insufficiently Active (01/12/2022)  Social Connections: Moderately Integrated (12/26/2023)  Recent Concern: Social Connections - Moderately Isolated (12/24/2023)  Stress: Stress Concern Present (01/12/2022)  Tobacco Use: Medium Risk (12/27/2023)    Readmission Risk Interventions     No data to display

## 2023-12-29 NOTE — Telephone Encounter (Signed)
 Patient Product/process development scientist completed.    The patient is insured through [facility]. Patient has Medicare and is not eligible for a copay card, but may be able to apply for patient assistance or Medicare RX Payment Plan (Patient Must reach out to their plan, if eligible for payment plan), if available.    Ran test claim for Eliquis  5 mg and the current 30 day co-pay is $0.00.  Ran test claim for Entresto  24-26 mg and the current 30 day co-pay is $0.00.  Ran test claim for Farxiga  10 mg and the current 30 day co-pay is $0.00.  This test claim was processed through [facility]- copay amounts may vary at other pharmacies due to pharmacy/plan contracts, or as the patient moves through the different stages of their insurance plan.     Reyes Sharps, CPHT Pharmacy Technician III Certified Patient Advocate [facility] Patient Advocate Team Direct Number: [phone]  Fax: [phone]

## 2023-12-29 NOTE — Progress Notes (Signed)
 Patient ID: Tyler ONEIDA Jearlean Mickey., male   DOB: [birth year], 68 y.o.   MRN: [id number]     Advanced Heart Failure Rounding Note  Cardiologist: [facility] Consulting HF Cardiologist: Dr. Rolan  Chief Complaint: Cardiogenic shock  Subjective:    Off milrinone  today. MAP stable.    He has been in and out of atrial fibrillation, alternating NSR and AF with mild RV while I was in the room.   More coughing overnight.  Not short of breath currently.   Swan #s CVP 7 PA 44/21 CI 2.88 CO-OX pending   TEE: Mildly dilated left ventricle with severe systolic dysfunction, EF 20-25% with global hypokinesis. Normal RV size with mild systolic dysfunction. Moderately dilated left atrium with no LA appendage thrombus. Normal right atrium. There was a small PFO by color doppler. Trivial TR. Trileaflet aortic valve with no stenosis and trivial regurgitation. Severe mitral regurgitation with PISA ERO 0.48 cm^2. MR appears functional with restriction of the posterior leaflet and malcoaptation. There was systolic flow reversal in the pulmonary vein doppler pattern.   Objective:   Weight Range: 75.8 kg Body mass index is 22.05 kg/m.   Vital Signs:   Temp:  [97.9 F (36.6 C)-99.7 F (37.6 C)] 98.8 F (37.1 C) (09/25 0830) Pulse Rate:  [69-196] 100 (09/25 0830) Resp:  [14-31] 31 (09/25 0830) BP: (87-141)/(55-109) 116/81 (09/25 0800) SpO2:  [78 %-100 %] 98 % (09/25 0830) Weight:  [75.8 kg] 75.8 kg (09/25 0500) Last BM Date : 12/28/23  Weight change: Filed Weights   12/27/23 1100 12/28/23 0500 12/29/23 0500  Weight: 76.5 kg 78 kg 75.8 kg    Intake/Output:   Intake/Output Summary (Last 24 hours) at 12/29/2023 0854 Last data filed at 12/29/2023 0800 Gross per 24 hour  Intake 1283.1 ml  Output 2000 ml  Net -716.9 ml      Physical Exam    General: NAD Neck: No JVD, no thyromegaly or thyroid  nodule.  Lungs: Clear to auscultation bilaterally with normal respiratory effort. CV: Nondisplaced PMI.   Heart regular S1/S2, no S3/S4, 2/6 HSM apex.  No peripheral edema.   Abdomen: Soft, nontender, no hepatosplenomegaly, no distention.  Skin: Intact without lesions or rashes.  Neurologic: Alert and oriented x 3.  Psych: Normal affect. Extremities: No clubbing or cyanosis.  HEENT: Normal.    Telemetry   In and out of atrial fibrillation (personally reviewed)  Labs    CBC Recent Labs    12/28/23 0520 12/29/23 0446  WBC 7.9 8.3  HGB 12.3* 12.7*  HCT 36.2* 36.8*  MCV 93.8 92.7  PLT 120* 125*   Basic Metabolic Panel Recent Labs    90/75/74 0520 12/29/23 0446  NA 137 136  K 3.9 4.1  CL 103 102  CO2 26 25  GLUCOSE 102* 108*  BUN 11 7*  CREATININE 1.10 1.02  CALCIUM  8.3* 8.4*  MG 2.0 1.9   Liver Function Tests No results for input(s): AST, ALT, ALKPHOS, BILITOT, PROT, ALBUMIN in the last 72 hours.  No results for input(s): LIPASE, AMYLASE in the last 72 hours. Cardiac Enzymes No results for input(s): CKTOTAL, CKMB, CKMBINDEX, TROPONINI in the last 72 hours.  BNP: BNP (last 3 results) Recent Labs    12/24/23 1111  BNP 818.6*    ProBNP (last 3 results) No results for input(s): PROBNP in the last 8760 hours.   D-Dimer No results for input(s): DDIMER in the last 72 hours.  Hemoglobin A1C No results for input(s): HGBA1C in the last 72  hours.  Fasting Lipid Panel No results for input(s): CHOL, HDL, LDLCALC, TRIG, CHOLHDL, LDLDIRECT in the last 72 hours. Thyroid  Function Tests No results for input(s): TSH, T4TOTAL, T3FREE, THYROIDAB in the last 72 hours.  Invalid input(s): FREET3   Other results:   Imaging    No results found.    Medications:     Scheduled Medications:  atorvastatin   20 mg Oral Daily   budesonide  (PULMICORT ) nebulizer solution  0.25 mg Nebulization BID   Chlorhexidine  Gluconate Cloth  6 each Topical Daily   dapagliflozin  propanediol  10 mg Oral Daily   digoxin   0.125 mg  Oral Daily   furosemide   80 mg Intravenous Once   insulin  aspart  0-9 Units Subcutaneous TID WC   methimazole   15 mg Oral Daily   sacubitril -valsartan   1 tablet Oral BID   sodium chloride  flush  10-40 mL Intracatheter Q12H   sodium chloride  flush  3 mL Intravenous Q12H   spironolactone   25 mg Oral Daily   traZODone   50 mg Oral QHS    Infusions:  amiodarone  60 mg/hr (12/29/23 0800)   heparin  1,350 Units/hr (12/29/23 0800)   magnesium  sulfate bolus IVPB      PRN Medications: acetaminophen , mouth rinse, sodium chloride  flush, sodium chloride  flush    Patient Profile   68 y.o. male with history of HFrEF/NICM w/ EF 20%, PAF, severe MR, hyperthyroidism and thyroid  nodule s/p I-131 ablation, prostate cancer s/p radiation therapy, hx left 2nd toe amputation 02/25 2/2 osteomyelitis.   Had recent viral syndrome. He was admitted to [facility] on 12/24/23 w/ AFL with RVR. Developed hypotension and cardiogenic shock after attempts to control rate with IV diltiazem  and IV metoprolol . Transferred to [facility] 12/26/23 for further management.  Assessment/Plan   Acute on chronic HFrEF >> Cardiogenic shock - EF has historically been in 20-25% range - Now presenting with cardiogenic shock in setting of Afib with RVR. Became hypotensive require pressor support after receiving IV diltiazem  and IV metoprolol . To complicate things, has severe MR - [facility] 12/25/23 on 0.25 milrinone  + 6 NE: No CAD, RA mean 12, PA 64/40, PCWP mean 34 w/ v waves to 56, Fick CI 1.6, TD CI 2.4, CPO 0.51 by Fick and 0.75 by TD, LVEDP 24 - Echo this admit EF 20-25%, RV okay, severe LAE, severe MR - TD CI 2.22 and CO-OX 67% off milrinone .   - CVP 7 this am but PA pressure elevated and has cough.  Will give Lasix  80 mg IV x 1.  - Continue digoxin  0.125 daily.  - Continue spironolactone  25 daily.  - Transition from losartan  to Entresto  24/26 bid.  - Add Farxiga  10 mg daily.  - Can remove Swan, follow CVP/co-ox from CVL.     2. Atrial  fibrillation/flutter with RVR - Admit with AFL with RVR in setting of hyperthyroidism - Would avoid IV beta blocker and IV diltiazem  in the future given the severity of his heart failure.  - Can transition to apixaban .  - DCCV to NSR 9/23 but has been in and out of AF since that time.  - He is on amiodarone  gtt, continue amiodarone  at 60 mg/hr to see if we can keep him in NSR.  This is not going to be ideal long-term with hyperthyroidism but was required for rate control in setting of AF/RVR/cardiogenic shock.   3. Severe MR - Suspect functional MR, confirmed severe MR on TEE 9/23 with restricted posterior leaflet and malcoaptation of leaflets.  This is potentiating  CHF and atrial fibrillation.  - Seen by structural heart team, will plan initial medical management.  If he can be compliant with medication regimen and still has severe MR, would be candidate for mTEER. Will follow as outpatient.     4. Hyperthyroidism - Chronic hyperthyroidism in setting of multnodular goiter, h/o I-131 ablation.  - On methimazole  (unclear what dose he was taking prior to admission). Has been started on 15 mg methimazole  daily here. - TSH < 0.1, free T4 2.3 -  Sees endocrine at [facility]  5. AKI  - Resolved, creatinine 1.12 today.   CRITICAL CARE Performed by: [name]  Total critical care time: 35 minutes  Critical care time was exclusive of separately billable procedures and treating other patients.  Critical care was necessary to treat or prevent imminent or life-threatening deterioration.  Critical care was time spent personally by me on the following activities: development of treatment plan with patient and/or surrogate as well as nursing, discussions with consultants, evaluation of patient's response to treatment, examination of patient, obtaining history from patient or surrogate, ordering and performing treatments and interventions, ordering and review of laboratory studies, ordering and review of  radiographic studies, pulse oximetry and re-evaluation of patient's condition.  [name] 12/29/2023 8:54 AM

## 2023-12-30 ENCOUNTER — Telehealth: Payer: Self-pay

## 2023-12-30 DIAGNOSIS — R570 Cardiogenic shock: Secondary | ICD-10-CM | POA: Diagnosis not present

## 2023-12-30 LAB — BASIC METABOLIC PANEL WITH GFR
Anion gap: 10 (ref 5–15)
BUN: 10 mg/dL (ref 8–23)
CO2: 26 mmol/L (ref 22–32)
Calcium: 8.4 mg/dL — ABNORMAL LOW (ref 8.9–10.3)
Chloride: 99 mmol/L (ref 98–111)
Creatinine, Ser: 1.2 mg/dL (ref 0.61–1.24)
GFR, Estimated: >60 mL/min (ref >60)
Glucose, Bld: 104 mg/dL — ABNORMAL HIGH (ref 70–99)
Potassium: 3.7 mmol/L (ref 3.5–5.1)
Sodium: 135 mmol/L (ref 135–145)

## 2023-12-30 LAB — CBC
HCT: 41.3 % (ref 39.0–52.0)
Hemoglobin: 13.9 g/dL (ref 13.0–17.0)
MCH: 31.5 pg (ref 26.0–34.0)
MCHC: 33.7 g/dL (ref 30.0–36.0)
MCV: 93.7 fL (ref 80.0–100.0)
Platelets: 146 K/uL — ABNORMAL LOW (ref 150–400)
RBC: 4.41 MIL/uL (ref 4.22–5.81)
RDW: 12.3 % (ref 11.5–15.5)
WBC: 7.9 K/uL (ref 4.0–10.5)
nRBC: 0 % (ref 0.0–0.2)

## 2023-12-30 LAB — COOXEMETRY PANEL
Carboxyhemoglobin: 1.2 % (ref 0.5–1.5)
Methemoglobin: <0.7 % (ref 0.0–1.5)
O2 Saturation: 65.7 %
Total hemoglobin: 14.7 g/dL (ref 12.0–16.0)

## 2023-12-30 LAB — GLUCOSE, CAPILLARY
Glucose-Capillary: 145 mg/dL — ABNORMAL HIGH (ref 70–99)
Glucose-Capillary: 102 mg/dL — ABNORMAL HIGH (ref 70–99)
Glucose-Capillary: 98 mg/dL (ref 70–99)
Glucose-Capillary: 114 mg/dL — ABNORMAL HIGH (ref 70–99)

## 2023-12-30 LAB — MAGNESIUM: Magnesium: 2.1 mg/dL (ref 1.7–2.4)

## 2023-12-30 MED ORDER — POTASSIUM CHLORIDE CRYS ER 20 MEQ PO TBCR
40.0000 meq | EXTENDED_RELEASE_TABLET | Freq: Once | ORAL | Status: AC
Start: 2023-12-30 — End: 2023-12-30
  Administered 2023-12-30: 40 meq via ORAL
  Filled 2023-12-30: qty 2

## 2023-12-30 NOTE — Plan of Care (Signed)

## 2023-12-30 NOTE — Telephone Encounter (Signed)
 Tyler Cohen:  Fossa looks good for transseptal approach in the short axis and bicaval views. Enough room to manipulate device down to the valve plane. Patient has severe secondary MR with a broad jet seen across the valve. Posterior leaflet lengths measure ~0.9 enough for our larger and smaller clip options. Valve area measured under MPR at 10.4cm2 and gradient of . Plan on placing the first clip on the medial side of the jet with a plan to place and additional clip just lateral. NTW/XTW appropriate depending on case strategy.

## 2023-12-30 NOTE — Progress Notes (Addendum)
 Patient ID: Tyler Cohen., male   DOB: December 25, 1955, 68 y.o.   MRN: [id number]     Advanced Heart Failure Rounding Note  Cardiologist: [facility] Consulting HF Cardiologist: Dr. Rolan  Chief Complaint: Cardiogenic shock  Subjective:    Co-ox remains stable off milrinone , 66% today   Good diuresis yesterday, 4.8L in UOP. Wt down 6 lb. CVP 2. SBPs 90s but no dizziness.   Remains in Afib 90s-low 100s   Feels better. Denies CP. No dyspnea.    TEE: Mildly dilated left ventricle with severe systolic dysfunction, EF 20-25% with global hypokinesis. Normal RV size with mild systolic dysfunction. Moderately dilated left atrium with no LA appendage thrombus. Normal right atrium. There was a small PFO by color doppler. Trivial TR. Trileaflet aortic valve with no stenosis and trivial regurgitation. Severe mitral regurgitation with PISA ERO 0.48 cm^2. MR appears functional with restriction of the posterior leaflet and malcoaptation. There was systolic flow reversal in the pulmonary vein doppler pattern.   Objective:   Weight Range: 73.3 kg Body mass index is 21.32 kg/m.   Vital Signs:   Temp:  [97.6 F (36.4 C)-99 F (37.2 C)] 98 F (36.7 C) (09/26 0400) Pulse Rate:  [62-144] 91 (09/26 0800) Resp:  [11-34] 24 (09/26 0800) BP: (70-135)/(54-117) 91/69 (09/26 0800) SpO2:  [80 %-100 %] 98 % (09/26 0819) Weight:  [73.3 kg] 73.3 kg (09/26 0531) Last BM Date : 12/28/23  Weight change: Filed Weights   12/28/23 0500 12/29/23 0500 12/30/23 0531  Weight: 78 kg 75.8 kg 73.3 kg    Intake/Output:   Intake/Output Summary (Last 24 hours) at 12/30/2023 0820 Last data filed at 12/30/2023 0800 Gross per 24 hour  Intake 1220.01 ml  Output 4800 ml  Net -3579.99 ml      Physical Exam    GENERAL: NAD Lungs- clear  CARDIAC:  JVP: not elevated         Irregularly irregular rate and rhythm, 2/6 MR murmur, no LEE   ABDOMEN: Soft, non-tender, non-distended.  EXTREMITIES: Warm and well perfused.   NEUROLOGIC: No obvious FND   Telemetry   Afib low 100s (personally reviewed)  Labs    CBC Recent Labs    12/29/23 0446 12/30/23 0418  WBC 8.3 7.9  HGB 12.7* 13.9  HCT 36.8* 41.3  MCV 92.7 93.7  PLT 125* 146*   Basic Metabolic Panel Recent Labs    90/74/74 0446 12/30/23 0418  NA 136 135  K 4.1 3.7  CL 102 99  CO2 25 26  GLUCOSE 108* 104*  BUN 7* 10  CREATININE 1.02 1.20  CALCIUM  8.4* 8.4*  MG 1.9 2.1   Liver Function Tests No results for input(s): AST, ALT, ALKPHOS, BILITOT, PROT, ALBUMIN in the last 72 hours.  No results for input(s): LIPASE, AMYLASE in the last 72 hours. Cardiac Enzymes No results for input(s): CKTOTAL, CKMB, CKMBINDEX, TROPONINI in the last 72 hours.  BNP: BNP (last 3 results) Recent Labs    12/24/23 1111  BNP 818.6*    ProBNP (last 3 results) No results for input(s): PROBNP in the last 8760 hours.   D-Dimer No results for input(s): DDIMER in the last 72 hours.  Hemoglobin A1C No results for input(s): HGBA1C in the last 72 hours.  Fasting Lipid Panel No results for input(s): CHOL, HDL, LDLCALC, TRIG, CHOLHDL, LDLDIRECT in the last 72 hours. Thyroid  Function Tests No results for input(s): TSH, T4TOTAL, T3FREE, THYROIDAB in the last 72 hours.  Invalid input(s): FREET3  Other results:   Imaging    No results found.    Medications:     Scheduled Medications:  apixaban   5 mg Oral BID   atorvastatin   20 mg Oral Daily   budesonide  (PULMICORT ) nebulizer solution  0.25 mg Nebulization BID   Chlorhexidine  Gluconate Cloth  6 each Topical Daily   dapagliflozin  propanediol  10 mg Oral Daily   digoxin   0.125 mg Oral Daily   insulin  aspart  0-9 Units Subcutaneous TID WC   methimazole   15 mg Oral Daily   sacubitril -valsartan   1 tablet Oral BID   sodium chloride  flush  10-40 mL Intracatheter Q12H   sodium chloride  flush  3 mL Intravenous Q12H   spironolactone   25 mg  Oral Daily   traZODone   50 mg Oral QHS    Infusions:  amiodarone  60 mg/hr (12/30/23 0800)    PRN Medications: acetaminophen , guaiFENesin -dextromethorphan , mouth rinse, sodium chloride  flush, sodium chloride  flush    Patient Profile   68 y.o. male with history of HFrEF/NICM w/ EF 20%, PAF, severe MR, hyperthyroidism and thyroid  nodule s/p I-131 ablation, prostate cancer s/p radiation therapy, hx left 2nd toe amputation 02/25 2/2 osteomyelitis.   Had recent viral syndrome. He was admitted to [facility] on 12/24/23 w/ AFL with RVR. Developed hypotension and cardiogenic shock after attempts to control rate with IV diltiazem  and IV metoprolol . Transferred to [facility] 12/26/23 for further management.  Assessment/Plan   Acute on chronic HFrEF >> Cardiogenic shock - EF has historically been in 20-25% range - Now presenting with cardiogenic shock in setting of Afib with RVR. Became hypotensive require pressor support after receiving IV diltiazem  and IV metoprolol . To complicate things, has severe MR - [facility] 12/25/23 on 0.25 milrinone  + 6 NE: No CAD, RA mean 12, PA 64/40, PCWP mean 34 w/ v waves to 56, Fick CI 1.6, TD CI 2.4, CPO 0.51 by Fick and 0.75 by TD, LVEDP 24 - Echo this admit EF 20-25%, RV okay, severe LAE, severe MR - off Milrinone . Co-ox stable 66%  - CVP 2  - Continue digoxin  0.125 daily.  - Continue spironolactone  25 daily.  - Continue Entresto  24/26 bid. BP too soft for up-titration  - Continue Farxiga  10 mg daily.    2. Atrial fibrillation/flutter with RVR - Admit with AFL with RVR in setting of hyperthyroidism - Would avoid IV beta blocker and IV diltiazem  in the future given the severity of his heart failure.  - Continue apixaban  5 mg bid   - DCCV to NSR 9/23 but has been in and out of AF since that time.  - He is on amiodarone  gtt, continue amiodarone  at 60 mg/hr to see if we can keep him in NSR.  This is not going to be ideal long-term with hyperthyroidism but was required for  rate control in setting of AF/RVR/cardiogenic shock.   3. Severe MR - Suspect functional MR, confirmed severe MR on TEE 9/23 with restricted posterior leaflet and malcoaptation of leaflets.  This is potentiating CHF and atrial fibrillation.  - Seen by structural heart team, will plan initial medical management.  If he can be compliant with medication regimen and still has severe MR, would be candidate for mTEER. Will follow as outpatient.     4. Hyperthyroidism - Chronic hyperthyroidism in setting of multnodular goiter, h/o I-131 ablation.  - On methimazole  (unclear what dose he was taking prior to admission). Has been started on 15 mg methimazole  daily here. - TSH < 0.1, free T4  2.3 - Sees endocrine at [facility]  5. AKI  - Resolved, creatinine 1.2 today.   Caffie Shed, PA-C  12/30/2023  Patient seen with PA, I formulated the plan and agree with the above note.   CVP 2, co-ox 66%.  He is still predominantly in atrial fibrillation with occasional NSR.  I/Os net negative 2959, excellent diuresis. SBP 90s.   He has been walking in halls feels good.   General: NAD Neck: No JVD, no thyromegaly or thyroid  nodule.  Lungs: Clear to auscultation bilaterally with normal respiratory effort. CV: Nondisplaced PMI.  Heart regular S1/S2, no S3/S4, 3/6 HSM apex.  No peripheral edema.   Abdomen: Soft, nontender, no hepatosplenomegaly, no distention.  Skin: Intact without lesions or rashes.  Neurologic: Alert and oriented x 3.  Psych: Normal affect. Extremities: No clubbing or cyanosis.  HEENT: Normal.   CVP 2, hold diuretics today.  SBP too low to titrate GDMT today.  Co-ox good off milrinone .   Still in and out of AF, primarily AF.  Continue IV amiodarone  today, will switch to po probably tomorrow.  Will be hard to keep in NSR with severe MR and hyperthyroidism.   He can go to step down unit today.  [name] 12/30/2023 12:32 PM

## 2023-12-30 NOTE — Progress Notes (Signed)
 Physical Therapy Treatment Patient Details Name: Tyler T Middleton Jr. MRN: [id number] DOB: Oct 02, 1955 Today's Date: 12/30/2023   History of Present Illness Pt is 68 yo presenting to [facility] on 9/22 due to palpitations and was found to be in afib RVR with HR in 140's. Pmh: HFrEF, PAF, hypothyroidism, thyroid  nodule, prostate cancer.    PT Comments  Making steady progress towards acute functional goals. Ambulating with mild instability, only when challenged with higher level dynamic tasks. Low fall risk based on DGI, but elevated fall risk based on BERG with static tasks, specifically items involving narrow BOS, or SLS. Able to self correct LOB. Educated on safety and awareness. HR to 120 with activity.  Patient will continue to benefit from skilled physical therapy services to further improve independence with functional mobility.    If plan is discharge home, recommend the following: Assist for transportation;Assistance with cooking/housework   Can travel by private vehicle        Equipment Recommendations  None recommended by PT    Recommendations for Other Services       Precautions / Restrictions Precautions Precautions: Fall Recall of Precautions/Restrictions: Impaired     Mobility  Bed Mobility               General bed mobility comments: oob on arrival    Transfers Overall transfer level: Needs assistance Equipment used: None Transfers: Sit to/from Stand Sit to Stand: Supervision           General transfer comment: Supervision for safety, no device required able to self stabilize.    Ambulation/Gait Ambulation/Gait assistance: Supervision Gait Distance (Feet): 275 Feet Assistive device: None Gait Pattern/deviations: Step-through pattern, Decreased stride length, Drifts right/left Gait velocity: decreased Gait velocity interpretation: 1.31 - 2.62 ft/sec, indicative of limited community ambulator   General Gait Details: Grossly stable, minor deviations  noted with higher level dynamic challenges. Able to perform quick turns, variable speeds, backwards stepping, and obstacle navigation at supervision level. Cues for safety and awareness. HR to 120 at peak. denies dizziness.   Stairs             Wheelchair Mobility     Tilt Bed    Modified Rankin (Stroke Patients Only)       Balance Overall balance assessment: Mild deficits observed, not formally tested                               Standardized Balance Assessment Standardized Balance Assessment : Berg Balance Test, Dynamic Gait Index Berg Balance Test Sit to Stand: Able to stand without using hands and stabilize independently Standing Unsupported: Able to stand safely 2 minutes Sitting with Back Unsupported but Feet Supported on Floor or Stool: Able to sit safely and securely 2 minutes Stand to Sit: Sits safely with minimal use of hands Transfers: Able to transfer safely, minor use of hands Standing Unsupported with Eyes Closed: Able to stand 10 seconds safely Standing Ubsupported with Feet Together: Able to place feet together independently but unable to hold for 30 seconds From Standing, Reach Forward with Outstretched Arm: Can reach forward >12 cm safely (5) From Standing Position, Pick up Object from Floor: Able to pick up shoe safely and easily From Standing Position, Turn to Look Behind Over each Shoulder: Looks behind from both sides and weight shifts well Turn 360 Degrees: Able to turn 360 degrees safely in 4 seconds or less Standing Unsupported, Alternately Place Feet on Step/Stool:  Able to complete 4 steps without aid or supervision Standing Unsupported, One Foot in Front: Able to take small step independently and hold 30 seconds Standing on One Leg: Tries to lift leg/unable to hold 3 seconds but remains standing independently Total Score: 46 Dynamic Gait Index Level Surface: Normal Change in Gait Speed: Normal Gait with Horizontal Head Turns:  Normal Gait with Vertical Head Turns: Mild Impairment Gait and Pivot Turn: Normal Step Over Obstacle: Moderate Impairment Step Around Obstacles: Normal Steps: Mild Impairment Total Score: 20      Communication Communication Communication: No apparent difficulties  Cognition Arousal: Alert Behavior During Therapy: WFL for tasks assessed/performed   PT - Cognitive impairments: No apparent impairments                         Following commands: Intact      Cueing Cueing Techniques: Verbal cues  Exercises      General Comments        Pertinent Vitals/Pain Pain Assessment Pain Assessment: No/denies pain    Home Living                          Prior Function            PT Goals (current goals can now be found in the care plan section) Acute Rehab PT Goals Patient Stated Goal: To return to prior level of functioning. PT Goal Formulation: With patient Time For Goal Achievement: 01/11/24 Potential to Achieve Goals: Good Progress towards PT goals: Progressing toward goals    Frequency    Min 2X/week      PT Plan      Co-evaluation              AM-PAC PT 6 Clicks Mobility   Outcome Measure  Help needed turning from your back to your side while in a flat bed without using bedrails?: None Help needed moving from lying on your back to sitting on the side of a flat bed without using bedrails?: None Help needed moving to and from a bed to a chair (including a wheelchair)?: A Little Help needed standing up from a chair using your arms (e.g., wheelchair or bedside chair)?: A Little Help needed to walk in hospital room?: A Little Help needed climbing 3-5 steps with a railing? : A Little 6 Click Score: 20    End of Session Equipment Utilized During Treatment: Gait belt Activity Tolerance: Patient tolerated treatment well Patient left: in chair;with call bell/phone within reach;with nursing/sitter in room Nurse Communication: Mobility  status PT Visit Diagnosis: Unsteadiness on feet (R26.81);Other abnormalities of gait and mobility (R26.89)     Time: 8462-8445 PT Time Calculation (min) (ACUTE ONLY): 17 min  Charges:    $Gait Training: 8-22 mins PT General Charges $$ ACUTE PT VISIT: 1 Visit                     Leontine Roads, PT, DPT [facility]  Rehabilitation Services Physical Therapist Office: [phone] Website: [email or web address]    Leontine GORMAN Roads 12/30/2023, 4:34 PM

## 2023-12-30 NOTE — Telephone Encounter (Signed)
 Courtesy refill. Patient will need an office visit for additional refills.  Requested Prescriptions  Pending Prescriptions Disp Refills   empagliflozin  (JARDIANCE ) 10 MG TABS tablet [Pharmacy Med Name: JARDIANCE  10 MG TAB] 30 tablet 0    Sig: TAKE 1 TABLET BY MOUTH ONCE DAILY     Endocrinology:  Diabetes - SGLT2 Inhibitors Failed - 12/30/2023  2:43 PM      Failed - Valid encounter within last 6 months    Recent Outpatient Visits           6 months ago Encounter for Medicare annual wellness exam   [facility] [place], Megan P, DO   7 months ago Preop exam for internal medicine   [facility] [place], Megan P, DO              Passed - Cr in normal range and within 360 days    Creatinine, Ser  Date Value Ref Range Status  12/30/2023 1.20 0.61 - 1.24 mg/dL Final         Passed - HBA1C is between 0 and 7.9 and within 180 days    HB A1C (BAYER DCA - WAIVED)  Date Value Ref Range Status  05/09/2023 5.1 4.8 - 5.6 % Final    Comment:             Prediabetes: 5.7 - 6.4          Diabetes: >6.4          Glycemic control for adults with diabetes: <7.0    Hgb A1c MFr Bld  Date Value Ref Range Status  12/25/2023 5.4 4.8 - 5.6 % Final    Comment:    (NOTE) Diagnosis of Diabetes The following HbA1c ranges recommended by the American Diabetes Association (ADA) may be used as an aid in the diagnosis of diabetes mellitus.  Hemoglobin             Suggested A1C NGSP%              Diagnosis  <5.7                   Non Diabetic  5.7-6.4                Pre-Diabetic  >6.4                   Diabetic  <7.0                   Glycemic control for                       adults with diabetes.           Passed - eGFR in normal range and within 360 days    GFR calc Af Amer  Date Value Ref Range Status  02/25/2020 104 >59 mL/min/1.73 Final    Comment:    **In accordance with recommendations from the NKF-ASN Task force,**   [facility] is in  the process of updating its eGFR calculation to the   2021 CKD-EPI creatinine equation that estimates kidney function   without a race variable.    GFR, Estimated  Date Value Ref Range Status  12/30/2023 >60 >60 mL/min Final    Comment:    (NOTE) Calculated using the CKD-EPI Creatinine Equation (2021)    eGFR  Date Value Ref Range Status  06/20/2023 94 >59 mL/min/1.73 Final

## 2023-12-30 NOTE — Treatment Plan (Signed)
 Occupational Therapy Treatment Patient Details Name: Tyler Cohen. MRN: [id number] DOB: 1955/07/21 Today's Date: 12/30/2023   History of present illness Pt is 68 yo presenting to [facility] on 9/22 due to palpitations and was found to be in afib RVR with HR in 140's. Pmh: HFrEF, PAF, hypothyroidism, thyroid  nodule, prostate cancer.   OT comments  Pt making progress with basic transfers with HR 100-124 max during session on RA. Pt with good gait velocity and next session to challenge balance. Pt tolerated since level grooming supervision level. Recommendation for d/c home with family.       If plan is discharge home, recommend the following:  A little help with bathing/dressing/bathroom   Equipment Recommendations  None recommended by OT    Recommendations for Other Services      Precautions / Restrictions Precautions Precautions: Fall Precaution/Restrictions Comments: CVP line, SWAN on R       Mobility Bed Mobility               General bed mobility comments: oob on arrival    Transfers Overall transfer level: Needs assistance   Transfers: Sit to/from Stand Sit to Stand: Supervision     Step pivot transfers: Supervision           Balance Overall balance assessment: Mild deficits observed, not formally tested                                         ADL either performed or assessed with clinical judgement   ADL Overall ADL's : Needs assistance/impaired     Grooming: Oral care;Set up;Standing                   Toilet Transfer: Supervision/safety   Toileting- Clothing Manipulation and Hygiene: Supervision/safety       Functional mobility during ADLs: Supervision/safety General ADL Comments: pt walked the entire unit with max HR 124    Extremity/Trunk Assessment Upper Extremity Assessment Upper Extremity Assessment: Overall WFL for tasks assessed   Lower Extremity Assessment Lower Extremity Assessment: Overall WFL for  tasks assessed        Vision       Perception     Praxis     Communication Communication Communication: No apparent difficulties   Cognition Arousal: Alert Behavior During Therapy: WFL for tasks assessed/performed Cognition: Cognition impaired             OT - Cognition Comments: needs reinforecement of heart health management- no recall of 3 pounds in one day or 5 pounds over week. Reviewed again iwth booklet in room                 Following commands: Intact        Cueing      Exercises      Shoulder Instructions       General Comments max HR 124 RA    Pertinent Vitals/ Pain       Pain Assessment Pain Assessment: No/denies pain  Home Living                                          Prior Functioning/Environment              Frequency  Min 2X/week  Progress Toward Goals  OT Goals(current goals can now be found in the care plan section)  Progress towards OT goals: Progressing toward goals  Acute Rehab OT Goals Patient Stated Goal: to go home OT Goal Formulation: With patient Time For Goal Achievement: 01/11/24 Potential to Achieve Goals: Good ADL Goals Pt Will Transfer to Toilet: with modified independence;ambulating Additional ADL Goal #1: pt will complete 2 energy conservation strategies mod I Additional ADL Goal #2: pt will complete bed mobility mod I  Plan      Co-evaluation                 AM-PAC OT 6 Clicks Daily Activity     Outcome Measure   Help from another person eating meals?: None Help from another person taking care of personal grooming?: None Help from another person toileting, which includes using toliet, bedpan, or urinal?: A Little Help from another person bathing (including washing, rinsing, drying)?: A Little Help from another person to put on and taking off regular upper body clothing?: None Help from another person to put on and taking off regular lower body clothing?:  A Little 6 Click Score: 21    End of Session Equipment Utilized During Treatment: Gait belt  OT Visit Diagnosis: Unsteadiness on feet (R26.81)   Activity Tolerance Patient tolerated treatment well   Patient Left in chair;with call bell/phone within reach;with chair alarm set   Nurse Communication Mobility status;Precautions        Time: 9055-9043 OT Time Calculation (min): 12 min  Charges: OT General Charges $OT Visit: 1 Visit OT Treatments $Self Care/Home Management : 8-22 mins   Brynn, OTR/L  Acute Rehabilitation Services Office: [phone] .   Ely Molt 12/30/2023, 10:46 AM

## 2023-12-31 DIAGNOSIS — R570 Cardiogenic shock: Secondary | ICD-10-CM | POA: Diagnosis not present

## 2023-12-31 LAB — BASIC METABOLIC PANEL WITH GFR
Anion gap: 6 (ref 5–15)
BUN: 10 mg/dL (ref 8–23)
CO2: 25 mmol/L (ref 22–32)
Calcium: 8.5 mg/dL — ABNORMAL LOW (ref 8.9–10.3)
Chloride: 103 mmol/L (ref 98–111)
Creatinine, Ser: 1.36 mg/dL — ABNORMAL HIGH (ref 0.61–1.24)
GFR, Estimated: 57 mL/min — ABNORMAL LOW (ref >60)
Glucose, Bld: 102 mg/dL — ABNORMAL HIGH (ref 70–99)
Potassium: 4.2 mmol/L (ref 3.5–5.1)
Sodium: 134 mmol/L — ABNORMAL LOW (ref 135–145)

## 2023-12-31 LAB — CBC
HCT: 40.3 % (ref 39.0–52.0)
Hemoglobin: 13.5 g/dL (ref 13.0–17.0)
MCH: 31.6 pg (ref 26.0–34.0)
MCHC: 33.5 g/dL (ref 30.0–36.0)
MCV: 94.4 fL (ref 80.0–100.0)
Platelets: 163 K/uL (ref 150–400)
RBC: 4.27 MIL/uL (ref 4.22–5.81)
RDW: 12.7 % (ref 11.5–15.5)
WBC: 7.5 K/uL (ref 4.0–10.5)
nRBC: 0 % (ref 0.0–0.2)

## 2023-12-31 LAB — GLUCOSE, CAPILLARY
Glucose-Capillary: 100 mg/dL — ABNORMAL HIGH (ref 70–99)
Glucose-Capillary: 107 mg/dL — ABNORMAL HIGH (ref 70–99)
Glucose-Capillary: 97 mg/dL (ref 70–99)
Glucose-Capillary: 99 mg/dL (ref 70–99)

## 2023-12-31 LAB — COOXEMETRY PANEL
Carboxyhemoglobin: 0.6 % (ref 0.5–1.5)
Methemoglobin: 0.7 % (ref 0.0–1.5)
O2 Saturation: 57.5 %
Total hemoglobin: 14.6 g/dL (ref 12.0–16.0)

## 2023-12-31 MED ORDER — AMIODARONE HCL 200 MG PO TABS
400.0000 mg | ORAL_TABLET | Freq: Two times a day (BID) | ORAL | Status: DC
  Administered 2023-12-31 – 2024-01-01 (×3): 400 mg via ORAL
  Filled 2023-12-31 (×3): qty 2

## 2023-12-31 MED ORDER — LOSARTAN POTASSIUM 25 MG PO TABS
25.0000 mg | ORAL_TABLET | Freq: Every day | ORAL | Status: DC
  Administered 2024-01-01: 25 mg via ORAL
  Filled 2023-12-31 (×2): qty 1

## 2023-12-31 NOTE — Progress Notes (Signed)
 Patient ID: Tyler Cohen., male   DOB: 1955-06-17, 68 y.o.   MRN: [id number]     Advanced Heart Failure Rounding Note  Cardiologist: [facility] HF Cardiologist: Dr. Rolan  Chief Complaint: Cardiogenic shock  Subjective:    No co-ox today, CVP 4.  Creatinine 1.36. SBP 80s-90s overnight.   Still alternating between AF and NSR on amiodarone  gtt.   Walking in halls, no complaints.   TEE: Mildly dilated left ventricle with severe systolic dysfunction, EF 20-25% with global hypokinesis. Normal RV size with mild systolic dysfunction. Moderately dilated left atrium with no LA appendage thrombus. Normal right atrium. There was a small PFO by color doppler. Trivial TR. Trileaflet aortic valve with no stenosis and trivial regurgitation. Severe mitral regurgitation with PISA ERO 0.48 cm^2. MR appears functional with restriction of the posterior leaflet and malcoaptation. There was systolic flow reversal in the pulmonary vein doppler pattern.   Objective:   Weight Range: 73.3 kg Body mass index is 21.33 kg/m.   Vital Signs:   Temp:  [97.1 F (36.2 C)-97.9 F (36.6 C)] 97.1 F (36.2 C) (09/27 0724) Pulse Rate:  [34-123] 52 (09/27 0815) Resp:  [16-36] 17 (09/26 2015) BP: (71-142)/(42-103) 94/60 (09/27 0815) SpO2:  [83 %-100 %] 99 % (09/27 0815) Weight:  [73.3 kg] 73.3 kg (09/27 0500) Last BM Date : 12/30/23  Weight change: Filed Weights   12/29/23 0500 12/30/23 0531 12/31/23 0500  Weight: 75.8 kg 73.3 kg 73.3 kg    Intake/Output:   Intake/Output Summary (Last 24 hours) at 12/31/2023 0921 Last data filed at 12/31/2023 0800 Gross per 24 hour  Intake 764.76 ml  Output 2125 ml  Net -1360.24 ml      Physical Exam    General: NAD Neck: No JVD, no thyromegaly or thyroid  nodule.  Lungs: Clear to auscultation bilaterally with normal respiratory effort. CV: Lateral PMI.  Heart regular S1/S2, no S3/S4, 3/6 HSM apex.  No peripheral edema.   Abdomen: Soft, nontender, no  hepatosplenomegaly, no distention.  Skin: Intact without lesions or rashes.  Neurologic: Alert and oriented x 3.  Psych: Normal affect. Extremities: No clubbing or cyanosis.  HEENT: Normal.   Telemetry   Still alternating between AF and NSR (personally reviewed)  Labs    CBC Recent Labs    12/30/23 0418 12/31/23 0509  WBC 7.9 7.5  HGB 13.9 13.5  HCT 41.3 40.3  MCV 93.7 94.4  PLT 146* 163   Basic Metabolic Panel Recent Labs    90/74/74 0446 12/30/23 0418 12/31/23 0509  NA 136 135 134*  K 4.1 3.7 4.2  CL 102 99 103  CO2 25 26 25   GLUCOSE 108* 104* 102*  BUN 7* 10 10  CREATININE 1.02 1.20 1.36*  CALCIUM  8.4* 8.4* 8.5*  MG 1.9 2.1  --    Liver Function Tests No results for input(s): AST, ALT, ALKPHOS, BILITOT, PROT, ALBUMIN in the last 72 hours.  No results for input(s): LIPASE, AMYLASE in the last 72 hours. Cardiac Enzymes No results for input(s): CKTOTAL, CKMB, CKMBINDEX, TROPONINI in the last 72 hours.  BNP: BNP (last 3 results) Recent Labs    12/24/23 1111  BNP 818.6*    ProBNP (last 3 results) No results for input(s): PROBNP in the last 8760 hours.   D-Dimer No results for input(s): DDIMER in the last 72 hours.  Hemoglobin A1C No results for input(s): HGBA1C in the last 72 hours.  Fasting Lipid Panel No results for input(s): CHOL, HDL, LDLCALC,  TRIG, CHOLHDL, LDLDIRECT in the last 72 hours. Thyroid  Function Tests No results for input(s): TSH, T4TOTAL, T3FREE, THYROIDAB in the last 72 hours.  Invalid input(s): FREET3   Other results:   Imaging    No results found.    Medications:     Scheduled Medications:  amiodarone   400 mg Oral BID   apixaban   5 mg Oral BID   atorvastatin   20 mg Oral Daily   budesonide  (PULMICORT ) nebulizer solution  0.25 mg Nebulization BID   Chlorhexidine  Gluconate Cloth  6 each Topical Daily   dapagliflozin  propanediol  10 mg Oral Daily   digoxin    0.125 mg Oral Daily   insulin  aspart  0-9 Units Subcutaneous TID WC   losartan   25 mg Oral Daily   methimazole   15 mg Oral Daily   sodium chloride  flush  10-40 mL Intracatheter Q12H   sodium chloride  flush  3 mL Intravenous Q12H   spironolactone   25 mg Oral Daily   traZODone   50 mg Oral QHS    Infusions:    PRN Medications: acetaminophen , guaiFENesin -dextromethorphan , mouth rinse, sodium chloride  flush, sodium chloride  flush    Patient Profile   68 y.o. male with history of HFrEF/NICM w/ EF 20%, PAF, severe MR, hyperthyroidism and thyroid  nodule s/p I-131 ablation, prostate cancer s/p radiation therapy, hx left 2nd toe amputation 02/25 2/2 osteomyelitis.   Had recent viral syndrome. He was admitted to [facility] on 12/24/23 w/ AFL with RVR. Developed hypotension and cardiogenic shock after attempts to control rate with IV diltiazem  and IV metoprolol . Transferred to [facility] 12/26/23 for further management.  Assessment/Plan   Acute on chronic HFrEF >> Cardiogenic shock - EF has historically been in 20-25% range - Now presenting with cardiogenic shock in setting of Afib with RVR. Became hypotensive require pressor support after receiving IV diltiazem  and IV metoprolol . To complicate things, has severe MR - [facility] 12/25/23 on 0.25 milrinone  + 6 NE: No CAD, RA mean 12, PA 64/40, PCWP mean 34 w/ v waves to 56, Fick CI 1.6, TD CI 2.4, CPO 0.51 by Fick and 0.75 by TD, LVEDP 24 - Echo this admit EF 20-25%, RV okay, severe LAE, severe MR - off Milrinone . Needs co-ox today.  - CVP 4, no Lasix  today.  - Continue digoxin  0.125 daily.  - Continue spironolactone  25 daily.  - With SBP 80s-90s, will stop Entresto  and go back to losartan  25 mg daily.  - Continue Farxiga  10 mg daily.    2. Atrial fibrillation/flutter with RVR - Admit with AFL with RVR in setting of hyperthyroidism - Would avoid IV beta blocker and IV diltiazem  in the future given the severity of his heart failure.  - Continue apixaban  5  mg bid   - DCCV to NSR 9/23 but has been frequently alternating NSR and AF since that time.  - He is on amiodarone  gtt.  This is not going to be ideal long-term with hyperthyroidism but was required for rate control in setting of AF/RVR/cardiogenic shock. - Transition to amiodarone  400 mg po bid and stop gtt today.   3. Severe MR - Suspect functional MR, confirmed severe MR on TEE 9/23 with restricted posterior leaflet and malcoaptation of leaflets.  This is potentiating CHF and atrial fibrillation.  - Seen by structural heart team, will plan initial medical management.  If he can be compliant with medication regimen and still has severe MR, would be candidate for mTEER. Will follow as outpatient.     4. Hyperthyroidism -  Chronic hyperthyroidism in setting of multnodular goiter, h/o I-131 ablation.  - On methimazole  (unclear what dose he was taking prior to admission). Has been started on 15 mg methimazole  daily here. - TSH < 0.1, free T4 2.3 - Sees endocrine at [facility]  5. AKI  - Resolved, creatinine 1.3 today.  He can go to progressive unit.    [name] 12/31/2023 9:21 AM

## 2024-01-01 ENCOUNTER — Other Ambulatory Visit (HOSPITAL_COMMUNITY): Payer: Self-pay

## 2024-01-01 DIAGNOSIS — R570 Cardiogenic shock: Secondary | ICD-10-CM | POA: Diagnosis not present

## 2024-01-01 LAB — COOXEMETRY PANEL
Carboxyhemoglobin: 0.3 % — ABNORMAL LOW (ref 0.5–1.5)
Methemoglobin: <0.7 % (ref 0.0–1.5)
O2 Saturation: 64.4 %
Total hemoglobin: 12.5 g/dL (ref 12.0–16.0)

## 2024-01-01 LAB — BASIC METABOLIC PANEL WITH GFR
Anion gap: 9 (ref 5–15)
BUN: 12 mg/dL (ref 8–23)
CO2: 23 mmol/L (ref 22–32)
Calcium: 8.5 mg/dL — ABNORMAL LOW (ref 8.9–10.3)
Chloride: 102 mmol/L (ref 98–111)
Creatinine, Ser: 1.26 mg/dL — ABNORMAL HIGH (ref 0.61–1.24)
GFR, Estimated: >60 mL/min (ref >60)
Glucose, Bld: 84 mg/dL (ref 70–99)
Potassium: 4.5 mmol/L (ref 3.5–5.1)
Sodium: 134 mmol/L — ABNORMAL LOW (ref 135–145)

## 2024-01-01 LAB — CBC
HCT: 39 % (ref 39.0–52.0)
Hemoglobin: 13.3 g/dL (ref 13.0–17.0)
MCH: 32.1 pg (ref 26.0–34.0)
MCHC: 34.1 g/dL (ref 30.0–36.0)
MCV: 94.2 fL (ref 80.0–100.0)
Platelets: 178 K/uL (ref 150–400)
RBC: 4.14 MIL/uL — ABNORMAL LOW (ref 4.22–5.81)
RDW: 12.9 % (ref 11.5–15.5)
WBC: 8 K/uL (ref 4.0–10.5)
nRBC: 0 % (ref 0.0–0.2)

## 2024-01-01 LAB — GLUCOSE, CAPILLARY
Glucose-Capillary: 115 mg/dL — ABNORMAL HIGH (ref 70–99)
Glucose-Capillary: 138 mg/dL — ABNORMAL HIGH (ref 70–99)

## 2024-01-01 MED ORDER — LOSARTAN POTASSIUM 25 MG PO TABS
25.0000 mg | ORAL_TABLET | Freq: Every day | ORAL | 3 refills | Status: DC
  Filled 2024-01-01: qty 90, 90d supply, fill #0
  Filled 2024-01-01: qty 7, 7d supply, fill #0

## 2024-01-01 MED ORDER — FUROSEMIDE 40 MG PO TABS
20.0000 mg | ORAL_TABLET | Freq: Every day | ORAL | 3 refills | Status: DC
  Filled 2024-01-01: qty 45, 90d supply, fill #0
  Filled 2024-01-01: qty 4, 8d supply, fill #0

## 2024-01-01 MED ORDER — DAPAGLIFLOZIN PROPANEDIOL 10 MG PO TABS
10.0000 mg | ORAL_TABLET | Freq: Every day | ORAL | 3 refills | Status: DC
  Filled 2024-01-01: qty 90, 90d supply, fill #0

## 2024-01-01 MED ORDER — SPIRONOLACTONE 25 MG PO TABS
12.5000 mg | ORAL_TABLET | Freq: Every day | ORAL | 3 refills | Status: DC
  Filled 2024-01-01: qty 45, 90d supply, fill #0

## 2024-01-01 MED ORDER — METHIMAZOLE 10 MG PO TABS
15.0000 mg | ORAL_TABLET | Freq: Every day | ORAL | 6 refills | Status: DC
  Filled 2024-01-01: qty 45, 30d supply, fill #0
  Filled 2024-01-01: qty 11, 7d supply, fill #0

## 2024-01-01 MED ORDER — APIXABAN 5 MG PO TABS
5.0000 mg | ORAL_TABLET | Freq: Two times a day (BID) | ORAL | 3 refills | Status: DC
  Filled 2024-01-01: qty 180, 90d supply, fill #0

## 2024-01-01 MED ORDER — DIGOXIN 125 MCG PO TABS
125.0000 ug | ORAL_TABLET | Freq: Every day | ORAL | 3 refills | Status: DC
  Filled 2024-01-01: qty 90, 90d supply, fill #0
  Filled 2024-01-01: qty 7, 7d supply, fill #0
  Filled 2024-02-03: qty 90, 90d supply, fill #0
  Filled 2024-02-06: qty 7, 7d supply, fill #0

## 2024-01-01 MED ORDER — ATORVASTATIN CALCIUM 20 MG PO TABS
20.0000 mg | ORAL_TABLET | Freq: Every day | ORAL | 3 refills | Status: DC
  Filled 2024-01-01: qty 90, 90d supply, fill #0

## 2024-01-01 MED ORDER — METOPROLOL SUCCINATE ER 25 MG PO TB24
12.5000 mg | ORAL_TABLET | Freq: Every day | ORAL | 3 refills | Status: DC
  Filled 2024-01-01: qty 45, 90d supply, fill #0

## 2024-01-01 MED ORDER — AMIODARONE HCL 200 MG PO TABS
ORAL_TABLET | ORAL | 0 refills | Status: DC
  Filled 2024-01-01: qty 118, 97d supply, fill #0

## 2024-01-01 MED ORDER — FUROSEMIDE 20 MG PO TABS: 20.0000 mg | ORAL_TABLET | Freq: Every day | ORAL | Status: DC

## 2024-01-01 MED ORDER — METOPROLOL SUCCINATE ER 25 MG PO TB24
12.5000 mg | ORAL_TABLET | Freq: Every day | ORAL | Status: DC
  Administered 2024-01-01: 12.5 mg via ORAL
  Filled 2024-01-01: qty 1

## 2024-01-01 MED ORDER — SPIRONOLACTONE 25 MG PO TABS
25.0000 mg | ORAL_TABLET | Freq: Every day | ORAL | 3 refills | Status: DC
  Filled 2024-01-01: qty 90, 90d supply, fill #0

## 2024-01-01 NOTE — Plan of Care (Signed)

## 2024-01-01 NOTE — Progress Notes (Signed)
  Patient arrived to the floor via the Wheelchair alert and oriented, pt oriented to staff and equipment skin assessment done by RN 2 RNs, Chg bath given, standup scale obtained. We'll continue to monitor.

## 2024-01-01 NOTE — Progress Notes (Signed)
 Patient ID: Tyler ONEIDA Jearlean Mickey., male   DOB: 11/23/1955, 68 y.o.   MRN: [id number]     Advanced Heart Failure Rounding Note  Cardiologist: [facility] HF Cardiologist: Dr. Rolan  Chief Complaint: Cardiogenic shock  Subjective:    Co-ox 64%, CVP < 5.  Creatinine 1.36 => 1.26. SBP 90s-100s.    Still alternating between AF and NSR on po amiodarone . Rate is controlled when in AF.   Walking in halls, no complaints.   TEE: Mildly dilated left ventricle with severe systolic dysfunction, EF 20-25% with global hypokinesis. Normal RV size with mild systolic dysfunction. Moderately dilated left atrium with no LA appendage thrombus. Normal right atrium. There was a small PFO by color doppler. Trivial TR. Trileaflet aortic valve with no stenosis and trivial regurgitation. Severe mitral regurgitation with PISA ERO 0.48 cm^2. MR appears functional with restriction of the posterior leaflet and malcoaptation. There was systolic flow reversal in the pulmonary vein doppler pattern.   Objective:   Weight Range: 72.6 kg Body mass index is 21.12 kg/m.   Vital Signs:   Temp:  [97.9 F (36.6 C)-98.6 F (37 C)] 97.9 F (36.6 C) (09/28 0814) Pulse Rate:  [39-114] 82 (09/28 0814) Resp:  [18-30] 19 (09/28 0814) BP: (73-118)/(21-94) 102/64 (09/28 0814) SpO2:  [87 %-100 %] 99 % (09/28 0814) Weight:  [72.6 kg] 72.6 kg (09/28 0151) Last BM Date : 12/31/23  Weight change: Filed Weights   12/30/23 0531 12/31/23 0500 01/01/24 0151  Weight: 73.3 kg 73.3 kg 72.6 kg    Intake/Output:   Intake/Output Summary (Last 24 hours) at 01/01/2024 1140 Last data filed at 01/01/2024 0100 Gross per 24 hour  Intake 792.01 ml  Output 1375 ml  Net -582.99 ml      Physical Exam    General: NAD Neck: No JVD, no thyromegaly or thyroid  nodule.  Lungs: Clear to auscultation bilaterally with normal respiratory effort. CV: Nondisplaced PMI.  Heart regular S1/S2, no S3/S4, 3/6 HSM apex.  No peripheral edema.    Abdomen: Soft, nontender, no hepatosplenomegaly, no distention.  Skin: Intact without lesions or rashes.  Neurologic: Alert and oriented x 3.  Psych: Normal affect. Extremities: No clubbing or cyanosis.  HEENT: Normal.   Telemetry   Still alternating between AF and NSR.  Rate is controlled when in AF (personally reviewed)  Labs    CBC Recent Labs    12/31/23 0509 01/01/24 0430  WBC 7.5 8.0  HGB 13.5 13.3  HCT 40.3 39.0  MCV 94.4 94.2  PLT 163 178   Basic Metabolic Panel Recent Labs    90/73/74 0418 12/31/23 0509 01/01/24 0430  NA 135 134* 134*  K 3.7 4.2 4.5  CL 99 103 102  CO2 26 25 23   GLUCOSE 104* 102* 84  BUN 10 10 12   CREATININE 1.20 1.36* 1.26*  CALCIUM  8.4* 8.5* 8.5*  MG 2.1  --   --    Liver Function Tests No results for input(s): AST, ALT, ALKPHOS, BILITOT, PROT, ALBUMIN in the last 72 hours.  No results for input(s): LIPASE, AMYLASE in the last 72 hours. Cardiac Enzymes No results for input(s): CKTOTAL, CKMB, CKMBINDEX, TROPONINI in the last 72 hours.  BNP: BNP (last 3 results) Recent Labs    12/24/23 1111  BNP 818.6*    ProBNP (last 3 results) No results for input(s): PROBNP in the last 8760 hours.   D-Dimer No results for input(s): DDIMER in the last 72 hours.  Hemoglobin A1C No results for input(s):  HGBA1C in the last 72 hours.  Fasting Lipid Panel No results for input(s): CHOL, HDL, LDLCALC, TRIG, CHOLHDL, LDLDIRECT in the last 72 hours. Thyroid  Function Tests No results for input(s): TSH, T4TOTAL, T3FREE, THYROIDAB in the last 72 hours.  Invalid input(s): FREET3   Other results:   Imaging    No results found.    Medications:     Scheduled Medications:  amiodarone   400 mg Oral BID   apixaban   5 mg Oral BID   atorvastatin   20 mg Oral Daily   budesonide  (PULMICORT ) nebulizer solution  0.25 mg Nebulization BID   Chlorhexidine  Gluconate Cloth  6 each Topical Daily    dapagliflozin  propanediol  10 mg Oral Daily   digoxin   0.125 mg Oral Daily   [START ON 01/02/2024] furosemide   20 mg Oral Daily   insulin  aspart  0-9 Units Subcutaneous TID WC   losartan   25 mg Oral Daily   methimazole   15 mg Oral Daily   metoprolol  succinate  12.5 mg Oral Daily   sodium chloride  flush  10-40 mL Intracatheter Q12H   sodium chloride  flush  3 mL Intravenous Q12H   spironolactone   25 mg Oral Daily   traZODone   50 mg Oral QHS    Infusions:    PRN Medications: acetaminophen , guaiFENesin -dextromethorphan , mouth rinse, sodium chloride  flush, sodium chloride  flush    Patient Profile   68 y.o. male with history of HFrEF/NICM w/ EF 20%, PAF, severe MR, hyperthyroidism and thyroid  nodule s/p I-131 ablation, prostate cancer s/p radiation therapy, hx left 2nd toe amputation 02/25 2/2 osteomyelitis.   Had recent viral syndrome. He was admitted to [facility] on 12/24/23 w/ AFL with RVR. Developed hypotension and cardiogenic shock after attempts to control rate with IV diltiazem  and IV metoprolol . Transferred to [facility] 12/26/23 for further management.  Assessment/Plan   Acute on chronic HFrEF >> Cardiogenic shock - EF has historically been in 20-25% range - Now presenting with cardiogenic shock in setting of Afib with RVR. Became hypotensive require pressor support after receiving IV diltiazem  and IV metoprolol . To complicate things, has severe MR - [facility] 12/25/23 on 0.25 milrinone  + 6 NE: No CAD, RA mean 12, PA 64/40, PCWP mean 34 w/ v waves to 56, Fick CI 1.6, TD CI 2.4, CPO 0.51 by Fick and 0.75 by TD, LVEDP 24 - Echo this admit EF 20-25%, RV okay, severe LAE, severe MR - off Milrinone . Co-ox 64% today.  - CVP <5, no Lasix  today.  I will have him take Lasix  20 mg daily at home.   - Continue digoxin  0.125 daily.  - Continue spironolactone  25 daily.  - Continue losartan  25 mg daily, no BP room for Entresto .  - Continue Farxiga  10 mg daily.  - Add Toprol  XL 12.5 daily.   2. Atrial  fibrillation/flutter with RVR - Admit with AFL with RVR in setting of hyperthyroidism - Would avoid IV beta blocker and IV diltiazem  in the future given the severity of his heart failure.  - Continue apixaban  5 mg bid   - DCCV to NSR 9/23 but has been frequently alternating NSR and AF since that time.  - He is now on po amiodarone .  This is not going to be ideal long-term with hyperthyroidism but was required for rate control in setting of AF/RVR/cardiogenic shock. I will continue this for now as we determine ultimate plan for his AF (unlikely to remain consistently in NSR until he has mTEER).  - Adding Toprol  XL 12.5 daily as  above.    3. Severe MR - Suspect functional MR, confirmed severe MR on TEE 9/23 with restricted posterior leaflet and malcoaptation of leaflets.  This is potentiating CHF and atrial fibrillation.  - Seen by structural heart team, will plan initial medical management.  If he can be compliant with medication regimen and still has severe MR, would be candidate for mTEER. Will follow as outpatient.     4. Hyperthyroidism - Chronic hyperthyroidism in setting of multnodular goiter, h/o I-131 ablation.  - On methimazole  (unclear what dose he was taking prior to admission). Has been started on 15 mg methimazole  daily here. - TSH < 0.1, free T4 2.3 - Sees endocrine at [facility]  5. AKI  - Resolved, creatinine 1.2 today.  I think he can go home today.  Needs followup in 1 week in [facility] HF clinic.  Meds for home: amiodarone  400 mg bid x 1 week then 200 mg bid then 200 mg daily; apixaban  5 bid, Toprol  XL 12.5 daily, losartan  25 daily, Farxiga  10 daily, digoxin  0.125 daily, spironolactone  25 daily, atorvastatin  20 daily, methimazole  15 daily.    [name] 01/01/2024 11:40 AM

## 2024-01-01 NOTE — Discharge Summary (Addendum)
 Discharge Summary   Patient ID: Tyler Cohen. MRN: [id number]; DOB: 06/14/1955  Admit date: 12/26/2023 Discharge date: 01/01/2024  PCP:  Vicci Duwaine SQUIBB, DO   [facility] Providers Cardiologist:  [name], MD     Discharge Diagnoses  Principal Problem:   Cardiogenic shock [facility]) Active Problems:   Hyperlipidemia   Tobacco abuse   Aortic atherosclerosis   Congestive heart failure (HCC)   Afib (HCC)   Mitral valve insufficiency   Cardiomyopathy (HCC)   Diagnostic Studies/Procedures   Echo 12/25/23  1. Left ventricular ejection fraction, by estimation, is 20 to 25%. Left  ventricular ejection fraction by PLAX is 21 %. The left ventricle has  severely decreased function. The left ventricle demonstrates global  hypokinesis. Left ventricular diastolic  parameters are indeterminate.   2. Right ventricular systolic function is normal. The right ventricular  size is normal.   3. Left atrial size was severely dilated.   4. The mitral valve is abnormal. Severe mitral valve regurgitation. No  evidence of mitral stenosis.   5. Tricuspid valve regurgitation is mild to moderate.   6. The aortic valve is normal in structure. Aortic valve regurgitation is  trivial. No aortic stenosis is present.   7. The inferior vena cava is normal in size with greater than 50%  respiratory variability, suggesting right atrial pressure of 3 mmHg.    R/L [facility] 12/25/23: 1.  Normal right dominant circulation with no obstructive coronary artery disease. 2.  Metabolic acidosis treated with 3 amps of bicarbonate with subsequent normalization of ABG. 3.  With the patient on milrinone  0.25 mcg/kilogram/minute and norepinephrine  6 mcg/min, the following hemodynamically was obtained;            Fick cardiac output of 3.2 L/min and Fick cardiac index of 1.6 L/min/m            Thermodilution cardiac output of 4.8 L/min/m with thermodilution cardiac index of 2.4 L/min/m                        Right atrial pressure of mean of 12 with V waves to 15 mmHg                       PA pressure of 64/40 with a mean of 37 mmHg                       Wedge pressure mean of 34 mmHg with V waves to 56 mmHg                       PVR of 0.92 by Fick and 0.62 by TD                       PA pulsatility index of 2                       CPO of 0.51 by Fick and 0.75 by TD 4.  LVEDP of 24 mmHg 5.  Successful left radial arterial line placement. 6.  Capacious iliofemoral vessels bilaterally   Summary: Given improving lactate (2.7 > 1.4), ABG, and CPO > 0.6, mechanical circulatory support was not pursued.  Iliofemoral vessels are approachable if mechanical circulatory support is needed in the future.  The results were discussed with Dr. Sabarwal who will accept the patient in transfer.  Continue milrinone  0.25 mcg/kg/min,  and norepinephrine  to oh MAP goal of 70 mmHg.  Trend co-oximetry and lactate.  Discussed with sister Tyler Cohen Dixons by phone [phone]) _____________   History of Present Illness   Tyler Cohen. is a 68 y.o. male with a hx of chronic HFrEF with LVEF 20-25%, PAF on Eliquis , hyperthyroidism on methimazole , thyroid  nodule status post I-131 ablation, prostate cancer s/p XRT, renal mass, adrenal mass, pulmonary mass, former tobacco abuse, HTN, HLD, persistent atrial fib/flutter, osteo s/p L 2nd toe amputation (05/2023), non compliance and severe MR.  Mr. Chien lives in [place] with his girlfriend.  His sister, Tyler Cohen, lives close by.  He retired in 2023 after working for [employer] for many years.  He remains active riding his bike and working out in the yard.  He drives a car and is able to take care of all of his own ADLs.  He is missing all of his teeth except for one.  He does not get regular dental care.   He was admitted to [facility] on 05/14/2021 with multifocal bronchopneumonia and thyroid  storm felt to possibly be related to toxic multinodular goiter. Noted to be in atrial flutter  with RVR with new cardiomyopathy EF 25-30% and heart failure requiring diuresis. He was noted to have moderate MR at that time with a small mobile opacity noted on anterior and posterior mitral valve leaflets, unable to exclude valve vegetations vs other etiology/valve cord. He was ultimately transferred to [facility] for endocrinology assessment and had been treated there. Subsequently s/p 131 ablation and treated with methimazole .    Cardiac CT 08/20/21 showed no CAD, calcium  score 0 and felt to have NICM. Pulmonary nodule noted.    He was in his usual state of health until early September when he developed URI-like symptoms with a runny nose, sore throat and dry cough.  He admits to taking copious amounts of Sudafed and not taking any of his prescribed medications.  He ultimately presented on 12/24/23 to [facility] with chest pain and palpitations and found to be in atrial flutter with RVR. Echo showed EF 20-25%, severe LAE, severe MR and mild to mod TR. He was treated with IV dilt/metoprolol  for rate control and developed hypotension and cardiogenic shock. Lactic acid 2.7, creat up to 1.4, mildly elevated LFTs. TSH < 0.1, free T4 2.3. Started back on methimazole . He was started on inotropic support.  He reported chest pain in the setting of a mildly elevated HS troponin (20, 20, 13). He was taken to the cath lab emergently for [facility] 12/25/23 on 0.25 milrinone  + 6 NE which showed no CAD, RA mean 12, PA 64/40, PCWP mean 34 w/ v waves to 56, Fick CI 1.6, TD CI 2.4, CPO 0.51 by Fick and 0.75 by TD, LVEDP 24. He was transferred to [facility] for management by the advanced CHF team and IV diuresis.    TEE/DCCV attempted 12/28/23 but had early return to atrial flutter. On heparin  gtt and amiodarone  gtt and in and out of atrial flutter (currently in sinus). AV nodals being avoided. Amiodarone  not felt to be a good long term medication with chronic thyroid  disease.    TEE showed mildly dilated left ventricle with severe systolic  dysfunction, EF 20-25% with global hypokinesis. Normal RV size with mild systolic dysfunction. Moderately dilated left atrium with no LA appendage thrombus. Normal right atrium. There was a small PFO by color doppler. Trivial TR. Trileaflet aortic valve with no stenosis and trivial regurgitation. Severe mitral regurgitation with PISA ERO 0.48  cm^2. MR appears functional with restriction of the posterior leaflet and malcoaptation. There was systolic flow reversal in the pulmonary vein doppler pattern.    He has been stabilized on GDMT and taken off milrinone  and levophed . TD CI 2.22 and CO-OX 67% off milrinone  this morning. Diuretics on hold. BP currently 121/87 and he is feeling symptomatically much improved.    Structural heart is consulted for consideration of mTEER. His girlfreind is present in the room and sister on speaker phone. He is currently feeling much better after IV diuresis. He currently denies CP or SOB. No LE edema, orthopnea or PND. No dizziness or syncope. No blood in stool or urine. No palpitations. He does admit to not taking his medications for an unclear amount of time and does not have a good explanation as to why. He reports no financial barriers to this.    Hospital Course   Consultants: structural heart team for MR  Acute on chronic systolic heart failure Hypertension LVEF historically in the 20-25% range.  He presented in cardiogenic shock in the setting of A-fib with RVR.  He required milrinone  support along with norepinephrine .  He underwent right and left heart catheterization 12/25/2023 that showed no obstructive CAD.  He was weaned off of milrinone  and norepinephrine  was stable Coox stable.  GDMT includes digoxin  0.125 mg daily, increased to 25 mg spironolactone .  Entresto  was discontinued due to SBP in the 80s/90s.  To that end, losartan  25 mg daily was instituted.  He was continued on Farxiga  10 mg daily and started on 12.5 mg Toprol  today. Appears jardiance  was refilled  by outside provider, instructed him to not pick this up. He reportedly was having side effects with jardiance , switched to farxiga  this admission.    Atrial flutter with RVR PAF Chronic anticoagulation Course was complicated by cardiogenic shock with RVR.  TEE guided cardioversion was attempted 12/27/23 with early return to A-fib/flutter.  He continued to alternate between NSR and A-fib/flutter.  He was treated with IV amiodarone , but this is felt not ideal given hyperthyroidism.  Options are limited in the setting of rates in cardiogenic shock.  He was transition to p.o. amiodarone  400 mg twice daily.  Will continue outpatient p.o. amiodarone  load with 400 mg p.o. twice daily x 7 days then 200 mg daily. Added toprol  12.5 mg daily.    Severe MR Suspect functional MR, confirm severe MR on TEE on 12/27/2023.  He has a restricted posterior leaflet with mild coaptation of the leaflets.  Structural heart team was consulted and will initially plan for medical management.  If he demonstrates medication compliance with persistent severe MR, will consider MitraClip.  They will follow-up outpatient.   Hyperthyroidism TSH less than 0.1, free T42.3 He follows with endocrinology at [facility].  Unclear what dose of methimazole .  He was started on 15 mg of methimazole  here.   Medication nonadherence He reportedly had not taken prescribed medications for an unclear amount of time, no financial barriers to medications.   Patient was seen and examined by Dr. Rolan and felt stable for discharge.  I have messaged our team to arrange follow-up with advanced heart failure clinic in [place].      Did the patient have an acute coronary syndrome (MI, NSTEMI, STEMI, etc) this admission?:  No                               Did the patient have a  percutaneous coronary intervention (stent / angioplasty)?:  No.        The patient will be scheduled for a TOC follow up appointment in 7-14 days.  A message has been sent  to the [facility] and Scheduling Pool at the office where the patient should be seen for follow up.  _____________  Discharge Vitals Blood pressure 100/89, pulse 77, temperature 97.8 F (36.6 C), temperature source Oral, resp. rate 20, height 6' 1 (1.854 m), weight 72.6 kg, SpO2 97%.  Filed Weights   12/30/23 0531 12/31/23 0500 01/01/24 0151  Weight: 73.3 kg 73.3 kg 72.6 kg    Labs & Radiologic Studies  CBC Recent Labs    12/31/23 0509 01/01/24 0430  WBC 7.5 8.0  HGB 13.5 13.3  HCT 40.3 39.0  MCV 94.4 94.2  PLT 163 178   Basic Metabolic Panel Recent Labs    90/73/74 0418 12/31/23 0509 01/01/24 0430  NA 135 134* 134*  K 3.7 4.2 4.5  CL 99 103 102  CO2 26 25 23   GLUCOSE 104* 102* 84  BUN 10 10 12   CREATININE 1.20 1.36* 1.26*  CALCIUM  8.4* 8.5* 8.5*  MG 2.1  --   --    Liver Function Tests No results for input(s): AST, ALT, ALKPHOS, BILITOT, PROT, ALBUMIN in the last 72 hours. No results for input(s): LIPASE, AMYLASE in the last 72 hours. High Sensitivity Troponin:   Recent Labs  Lab 12/24/23 1111 12/24/23 1324 12/25/23 1958  TROPONINIHS 20* 20* 13    No results for input(s): TRNPT in the last 720 hours.  BNP Invalid input(s): POCBNP No results for input(s): PROBNP in the last 72 hours.  No results for input(s): BNP in the last 72 hours.  D-Dimer No results for input(s): DDIMER in the last 72 hours. Hemoglobin A1C No results for input(s): HGBA1C in the last 72 hours. Fasting Lipid Panel No results for input(s): CHOL, HDL, LDLCALC, TRIG, CHOLHDL, LDLDIRECT in the last 72 hours. No results found for: LIPOA  Thyroid  Function Tests No results for input(s): TSH, T4TOTAL, T3FREE, THYROIDAB in the last 72 hours.  Invalid input(s): FREET3 _____________  DG CHEST PORT 1 VIEW Result Date: 12/27/2023 EXAM: 1 VIEW(S) XRAY OF THE CHEST 12/27/2023 11:19:00 AM COMPARISON: 12/26/2023 CLINICAL HISTORY: Hemoptysis  FINDINGS: LINES, TUBES AND DEVICES: Left IJ central line stable in position. Right IJ Swan-Ganz catheter stable in position with tip projecting over right upper lobe pulmonary artery branch. LUNGS AND PLEURA: No focal pulmonary opacity. No pulmonary edema. No pleural effusion. No pneumothorax. HEART AND MEDIASTINUM: No acute abnormality of the cardiac and mediastinal silhouettes. BONES AND SOFT TISSUES: No acute osseous abnormality. IMPRESSION: 1. Right IJ Swan-Ganz catheter stable in position with tip projecting over right upper lobe pulmonary artery branch, suggest retracting 4-5 cm. 2. Stable exam with no pulmonary edema. Electronically signed by: Selinda Blue MD 12/27/2023 03:15 PM EDT RP Workstation: [id number]   EP STUDY Result Date: 12/27/2023 See surgical note for result.  DG CHEST PORT 1 VIEW Result Date: 12/26/2023 CLINICAL DATA:  [id number] Swan neck deformity of cervical spine [id number] EXAM: PORTABLE CHEST 1 VIEW COMPARISON:  12/26/2023 FINDINGS: Swan-Ganz catheter tip has migrated into the right upper lobe pulmonary artery. Left central line tip in the SVC. Heart is upper limits normal in size. Mediastinal contours within normal limits. Stable patchy right perihilar opacities. No focal opacity on the left. No effusions. No pneumothorax. IMPRESSION: Swan-Ganz catheter tip now in the right upper lobe pulmonary artery. Stable patchy  right perihilar opacities. Electronically Signed   By: Franky Crease M.D.   On: 12/26/2023 20:29   DG CHEST PORT 1 VIEW Result Date: 12/26/2023 CLINICAL DATA:  [id number] with acute on chronic heart failure with reduced ejection fraction. EXAM: PORTABLE CHEST 1 VIEW COMPARISON:  Portable chest yesterday at 9:19 p.m. FINDINGS: 5:43 a.m. left IJ central line again terminates at the superior cavoatrial junction. A right IJ Swan-Ganz line has been added, terminating in the right lower lobe artery. Overlapping defibrillator pads superimposing over the lower left chest. Mild  cardiomegaly. Perihilar vascular prominence right-greater-than-left is again noted with patchy right perihilar opacities, in keeping with edema versus pneumonia. No subpleural edema is seen and no substantial pleural effusion. The remaining lungs are clear. The mediastinum is stable. No new osseous findings. No pneumothorax. IMPRESSION: 1. Right IJ Swan-Ganz line terminates in the right lower lobe artery. 2. Stable left IJ central line. 3. Stable cardiomegaly and perihilar vascular prominence. 4. Patchy right perihilar opacities, in keeping with edema versus pneumonia. Electronically Signed   By: Francis Quam M.D.   On: 12/26/2023 05:54   CARDIAC CATHETERIZATION Addendum Date: 12/25/2023 1.  Normal right dominant circulation with no obstructive coronary artery disease. 2.  Metabolic acidosis treated with 3 amps of bicarbonate with subsequent normalization of ABG. 3.  With the patient on milrinone  0.25 mcg/kilogram/minute and norepinephrine  6 mcg/min, the following hemodynamically was obtained;  Fick cardiac output of 3.2 L/min and Fick cardiac index of 1.6 L/min/m  Thermodilution cardiac output of 4.8 L/min/m with thermodilution cardiac index of 2.4 L/min/m   Right atrial pressure of mean of 12 with V waves to 15 mmHg   PA pressure of 64/40 with a mean of 37 mmHg   Wedge pressure mean of 34 mmHg with V waves to 56 mmHg   PVR of 0.92 by Fick and 0.62 by TD   PA pulsatility index of 2   CPO of 0.51 by Fick and 0.75 by TD 4.  LVEDP of 24 mmHg 5.  Successful left radial arterial line placement. 6.  Capacious iliofemoral vessels bilaterally Summary: Given improving lactate (2.7 > 1.4), ABG, and CPO > 0.6, mechanical circulatory support was not pursued.  Iliofemoral vessels are approachable if mechanical circulatory support is needed in the future.  The results were discussed with Dr. Sabarwal who will accept the patient in transfer.  Continue milrinone  0.25 mcg/kg/min, and norepinephrine  to oh MAP goal of 70  mmHg.  Trend co-oximetry and lactate.  Discussed with sister Tyler Cohen Dixons by phone [phone])  Result Date: 12/25/2023 1.  Normal right dominant circulation with no obstructive coronary artery disease. 2.  Metabolic acidosis treated with 3 amps of bicarbonate with subsequent normalization of ABG. 3.  With the patient on milrinone  0.25 mcg/kilogram/minute and norepinephrine  6 mcg/min, the following hemodynamically was obtained;  Fick cardiac output of 3.2 L/min and Fick cardiac index of 1.6 L/min/m  Thermodilution cardiac output of 4.8 L/min/m with thermodilution cardiac index of 2.4 L/min/m   Right atrial pressure of mean of 12 with V waves to 15 mmHg   PA pressure of 64/40 with a mean of 37 mmHg   Wedge pressure mean of 34 mmHg with V waves to 56 mmHg   PVR of 0.92 by Fick and 0.62 by TD   PA pulsatility index of 2   CPO of 0.51 by Fick and 0.75 by TD 4.  LVEDP of 24 mmHg 5.  Capacious iliofemoral vessels bilaterally Summary: Given improving lactate (2.7 >  1.4), ABG, and CPO > 0.6, mechanical circulatory support was not pursued.  Iliofemoral vessels are approachable if mechanical circulatory support is needed in the future.  The results were discussed with Dr. Sabarwal who will accept the patient in transfer.  Continue milrinone  0.25 mcg/kg/min, and norepinephrine  to oh MAP goal of 70 mmHg.  Trend co-oximetry and lactate.  Discussed with sister Tyler Cohen Dixons by phone [phone])   DG Chest Port 1 View Result Date: 12/25/2023 CLINICAL DATA:  Central line placement. EXAM: PORTABLE CHEST 1 VIEW COMPARISON:  Chest x-ray 12/25/2023. FINDINGS: There is a new left-sided central venous catheter with distal tip in the SVC. There is no pneumothorax. There are patchy airspace opacities in the central right lung similar to prior. The heart is enlarged, unchanged. No pleural effusion. IMPRESSION: 1. New left-sided central venous catheter with distal tip in the SVC. No pneumothorax. 2. Patchy airspace opacities in  the central right lung similar to prior. Electronically Signed   By: Greig Pique M.D.   On: 12/25/2023 21:30   ECHOCARDIOGRAM COMPLETE Result Date: 12/25/2023    ECHOCARDIOGRAM REPORT   Patient Name:   HOLMAN BONSIGNORE Date of Exam: 12/25/2023 Medical Rec #:  [id number]       Height:       73.0 in Accession #:    [id number]      Weight:       165.0 lb Date of Birth:  [birth year]       BSA:          1.983 m Patient Age:    68 years        BP:           107/80 mmHg Patient Gender: M               HR:           119 bpm. Exam Location:  [facility] Procedure: 2D Echo, 3D Echo, Cardiac Doppler, Color Doppler and Strain Analysis            (Both Spectral and Color Flow Doppler were utilized during            procedure). Indications:     R07.89 Other chest pain; R07.9* Chest pain, unspecified  History:         Patient has prior history of Echocardiogram examinations, most                  recent 05/14/2021. Signs/Symptoms:Chest Pain.  Sonographer:     Doyal Point MHA, BS, RDCS Referring Phys:  [id number] CORT ONEIDA MANA Diagnosing Phys: Annalee Custovic IMPRESSIONS  1. Left ventricular ejection fraction, by estimation, is 20 to 25%. Left ventricular ejection fraction by PLAX is 21 %. The left ventricle has severely decreased function. The left ventricle demonstrates global hypokinesis. Left ventricular diastolic parameters are indeterminate.  2. Right ventricular systolic function is normal. The right ventricular size is normal.  3. Left atrial size was severely dilated.  4. The mitral valve is abnormal. Severe mitral valve regurgitation. No evidence of mitral stenosis.  5. Tricuspid valve regurgitation is mild to moderate.  6. The aortic valve is normal in structure. Aortic valve regurgitation is trivial. No aortic stenosis is present.  7. The inferior vena cava is normal in size with greater than 50% respiratory variability, suggesting right atrial pressure of 3 mmHg. FINDINGS  Left Ventricle: Left ventricular ejection fraction, by  estimation, is 20 to 25%. Left ventricular ejection fraction by PLAX is 21 %. The left  ventricle has severely decreased function. The left ventricle demonstrates global hypokinesis. The left ventricular internal cavity size was normal in size. There is no left ventricular hypertrophy. Left ventricular diastolic parameters are indeterminate. Right Ventricle: The right ventricular size is normal. No increase in right ventricular wall thickness. Right ventricular systolic function is normal. Left Atrium: Left atrial size was severely dilated. Right Atrium: Right atrial size was normal in size. Pericardium: There is no evidence of pericardial effusion. Mitral Valve: The mitral valve is abnormal. Severe mitral valve regurgitation. No evidence of mitral valve stenosis. Tricuspid Valve: The tricuspid valve is normal in structure. Tricuspid valve regurgitation is mild to moderate. Aortic Valve: The aortic valve is normal in structure. Aortic valve regurgitation is trivial. No aortic stenosis is present. Aortic valve mean gradient measures 3.0 mmHg. Aortic valve peak gradient measures 5.0 mmHg. Aortic valve area, by VTI measures 2.27 cm. Pulmonic Valve: The pulmonic valve was normal in structure. Pulmonic valve regurgitation is mild. Aorta: The aortic root is normal in size and structure. Venous: The inferior vena cava is normal in size with greater than 50% respiratory variability, suggesting right atrial pressure of 3 mmHg. IAS/Shunts: No atrial level shunt detected by color flow Doppler.  LEFT VENTRICLE PLAX 2D LV EF:         Left ventricular ejection fraction by PLAX is 21 %. LVIDd:         6.00 cm LVIDs:         5.40 cm LV PW:         1.10 cm LV IVS:        0.90 cm LVOT diam:     2.30 cm LV SV:         39 LV SV Index:   20 LVOT Area:     4.15 cm  RIGHT VENTRICLE RV Basal diam:  4.25 cm RV Mid diam:    3.40 cm RV S prime:     14.90 cm/s TAPSE (M-mode): 1.7 cm LEFT ATRIUM              Index        RIGHT ATRIUM            Index LA diam:        3.80 cm  1.92 cm/m   RA Area:     20.40 cm LA Vol (A2C):   163.0 ml 82.21 ml/m  RA Volume:   65.10 ml  32.83 ml/m LA Vol (A4C):   147.0 ml 74.14 ml/m LA Biplane Vol: 161.0 ml 81.20 ml/m  AORTIC VALVE AV Area (Vmax):    2.47 cm AV Area (Vmean):   2.34 cm AV Area (VTI):     2.27 cm AV Vmax:           112.00 cm/s AV Vmean:          79.100 cm/s AV VTI:            0.171 m AV Peak Grad:      5.0 mmHg AV Mean Grad:      3.0 mmHg LVOT Vmax:         66.60 cm/s LVOT Vmean:        44.500 cm/s LVOT VTI:          0.094 m LVOT/AV VTI ratio: 0.55  AORTA Ao Root diam: 3.90 cm MR Peak grad:    124.5 mmHg MR Mean grad:    83.0 mmHg    SHUNTS MR Vmax:  558.00 cm/s  Systemic VTI:  0.09 m MR Vmean:        431.0 cm/s   Systemic Diam: 2.30 cm MR PISA:         7.60 cm MR PISA Eff ROA: 52 mm MR PISA Radius:  1.10 cm Annalee Custovic Electronically signed by Annalee Casa Signature Date/Time: 12/25/2023/9:42:18 AM    Final    DG Chest 1 View Result Date: 12/25/2023 CLINICAL DATA:  CHF. EXAM: CHEST  1 VIEW COMPARISON:  12/24/2023 FINDINGS: The lungs are clear without focal pneumonia, edema, pneumothorax or pleural effusion. Interstitial markings are diffusely coarsened with chronic features. Cardiopericardial silhouette is at upper limits of normal for size. No acute bony abnormality. Telemetry leads overlie the chest. IMPRESSION: Chronic interstitial coarsening without acute cardiopulmonary findings. Electronically Signed   By: Camellia Candle M.D.   On: 12/25/2023 07:32   DG Chest 2 View Result Date: 12/24/2023 CLINICAL DATA:  Chest pain and shortness of breath with generalized weakness 2 days. EXAM: CHEST - 2 VIEW COMPARISON:  06/16/2022, 05/13/2021 FINDINGS: Lungs are somewhat hyperexpanded with mild flattening of the hemidiaphragms on the lateral film. There is no focal airspace consolidation or effusion. Stable right apical scarring. Cardiomediastinal silhouette and remainder of the exam is  unchanged. IMPRESSION: 1. No acute cardiopulmonary disease. 2. COPD. Electronically Signed   By: Toribio Agreste M.D.   On: 12/24/2023 11:54    Disposition Pt is being discharged home today in good condition.  Follow-up Plans & Appointments  Discharge Instructions     Diet - low sodium heart healthy   Complete by: As directed    Discharge instructions   Complete by: As directed    No driving for 2 days. No lifting over 5 lbs for 1 week. No sexual activity for 1 week. Keep procedure site clean & dry. If you notice increased pain, swelling, bleeding or pus, call/return!  You may shower, but no soaking baths/hot tubs/pools for 1 week.   Increase activity slowly   Complete by: As directed    Increase activity slowly   Complete by: As directed        Discharge Medications Allergies as of 01/01/2024       Reactions   Lactose Intolerance (gi) Diarrhea, Other (See Comments)   Bloating, flatulence        Medication List     STOP taking these medications    empagliflozin  10 MG Tabs tablet Commonly known as: Jardiance    phenylephrine  10 MG Tabs tablet Commonly known as: SUDAFED PE       TAKE these medications    albuterol  108 (90 Base) MCG/ACT inhaler Commonly known as: VENTOLIN  HFA INHALE 2 PUFFS INTO THE LUNGS EVERY 6 HOURS AS NEEDED FOR WHEEZING OR SHORTNESS OF BREATH   amiodarone  200 MG tablet Commonly known as: PACERONE  Take 2 tablets (400 mg total) by mouth 2 (two) times daily for 7 days, THEN 1 tablet (200 mg total) daily. Start taking on: January 01, 2024   atorvastatin  20 MG tablet Commonly known as: LIPITOR Take 1 tablet (20 mg total) by mouth daily. Start taking on: January 02, 2024   digoxin  0.125 MG tablet Commonly known as: LANOXIN  Take 1 tablet (125 mcg total) by mouth daily.   Eliquis  5 MG Tabs tablet Generic drug: apixaban  Take 1 tablet (5 mg total) by mouth 2 (two) times daily.   Farxiga  10 MG Tabs tablet Generic drug: dapagliflozin   propanediol Take 1 tablet (10 mg total) by mouth daily. Start  taking on: January 02, 2024   furosemide  40 MG tablet Commonly known as: LASIX  Take 0.5 tablets (20 mg total) by mouth daily. What changed: how much to take   losartan  25 MG tablet Commonly known as: COZAAR  Take 1 tablet (25 mg total) by mouth daily.   MENS MULTIVITAMIN PO Take 1 tablet by mouth daily.   methimazole  10 MG tablet Commonly known as: TAPAZOLE  Take 1.5 tablets (15 mg total) by mouth daily. What changed:  how much to take when to take this additional instructions Another medication with the same name was removed. Continue taking this medication, and follow the directions you see here.   metoprolol  succinate 25 MG 24 hr tablet Commonly known as: TOPROL -XL Take 0.5 tablets (12.5 mg total) by mouth daily.   spironolactone  25 MG tablet Commonly known as: ALDACTONE  Take 1 tablet (25 mg total) by mouth daily. What changed: how much to take         Outstanding Labs/Studies BMP in 1 week  Duration of Discharge Encounter: APP Time: 25 minutes   Signed, Jon Nat Hails, PA 01/01/2024, 1:31 PM  Patient seen with PA, I formulated the plan and agree with the above note.   Please see my separate note for details.   32 minutes physician discharge time.   [name] 01/01/2024 2:00 PM

## 2024-01-02 ENCOUNTER — Telehealth: Payer: Self-pay

## 2024-01-02 NOTE — Telephone Encounter (Signed)
 SABRA

## 2024-01-03 ENCOUNTER — Telehealth: Payer: Self-pay | Admitting: Emergency Medicine

## 2024-01-03 DIAGNOSIS — I428 Other cardiomyopathies: Secondary | ICD-10-CM

## 2024-01-03 DIAGNOSIS — I5020 Unspecified systolic (congestive) heart failure: Secondary | ICD-10-CM

## 2024-01-03 LAB — ECHO TEE

## 2024-01-03 NOTE — Telephone Encounter (Signed)
 The patient has been notified of the referral placed to the HF clinic- verbalized understanding. All questions (if any) were answered.  Reminded of upcoming appointment and encouraged to call if any concerns or questions arise

## 2024-01-06 ENCOUNTER — Telehealth: Payer: Self-pay

## 2024-01-06 LAB — POCT I-STAT 7, (LYTES, BLD GAS, ICA,H+H)
Acid-base deficit: 4 mmol/L — ABNORMAL HIGH (ref 0.0–2.0)
Bicarbonate: 21.6 mmol/L (ref 20.0–28.0)
Calcium, Ion: 1.1 mmol/L — ABNORMAL LOW (ref 1.15–1.40)
HCT: 41 % (ref 39.0–52.0)
Hemoglobin: 13.9 g/dL (ref 13.0–17.0)
O2 Saturation: 93 %
Patient temperature: 36.3
Potassium: 4.5 mmol/L (ref 3.5–5.1)
Sodium: 140 mmol/L (ref 135–145)
TCO2: 23 mmol/L (ref 22–32)
pCO2 arterial: 41.2 mmHg (ref 32–48)
pH, Arterial: 7.325 — ABNORMAL LOW (ref 7.35–7.45)
pO2, Arterial: 68 mmHg — ABNORMAL LOW (ref 83–108)

## 2024-01-06 NOTE — Telephone Encounter (Signed)
 The patient cancelled his TOC visit with reason pt of [facility].  Called to confirm with the patient he has follow-up scheduled. The patient reported he has an appointment with the [facility] group on 01/12/2024.  He declined to schedule with [facility] or Cardiology. He stated he or [facility] will contact [facility] if he needs anything. He was thankful for call.

## 2024-01-12 ENCOUNTER — Ambulatory Visit: Admitting: Nurse Practitioner

## 2024-01-14 NOTE — Discharge Summary (Signed)
 PHYSICIAN DISCHARGE SUMMARY        Patient ID: Tyler Cohen. MRN: [id number] DOB/AGE: [birth year] 68 y.o.  Admit date: 12/24/2023 Discharge date: 01/14/2024    Discharge Diagnoses:                                                                        DISCHARGE PLAN BY DIAGNOSIS    #Cardiogenic Shock #Afib RVR s/p DCCV at outside hospital #HFrEF, LVEF 20% with global hypokinesis #Severe mitral regurgitation -Supplemental oxygen or BiPAP with goal >92% -High risk for intubation -Obtain 2D Echo -trend VBG/lactate  -Hold home GMDT iso shock -start Levophed  for MAP Goal -Holding anticoagulation per Dr. Lurena -Start milrinone  0.125mcg/kg/min with goal CI >2.2, central sat >65% -Cardiology consult eval for RHC can help determine filling pressures -Discussed with on call Cardiology Dr. Shona and STEMI on call Dr. Lurena Red who both agreed with plans for emergent [facility] +/- MCS and Transfer for: IABP, Impella, VA-ECMO, VAD management if indicated post cath. -Avoid NSAIDs, ACE/ARB and consider holding CHF and HTN medications based on hemodynamic status                DISCHARGE SUMMARY   Tyler Cohen. is a 68 y.o. y/o male with a PMH of  chronic HFrEF with LVEF 20-25%, PAF on Eliquis , hypothyroidism on methimazole , thyroid  nodule status post I-131 ablation, prostate cancer status post radiation therapy, who presented to the ED with cough shortness of breath, palpitations, and chest pain.     He was admitted to [facility] service with A fib with RVR, severe MR, severe cardiomyopathy EF less 20 to 25% and an enlarged left atrium. The patient was started on Cardizem  and beta-blockers in the ED. He received a few doses of digoxin  with a digoxin  level of 3.3; therefore, he was held. Patient remained in Afib with RVR with soft BP requiring Amiodarone  load after discussion with cardiology. Given thyroid  issues and inability to continue Amiodarone , he was started on oral sotalol  and  continued on Eliquis  and PRN beta-blockers. Cardiology Dr. Custovic attempted to get the patient transferred to [facility] for MVR, but there were no beds. Overnight, he complained of left-sided chest 10/10; he was profusely diaphoretic and actively vomiting. A STAT EKG was obtained, which showed no evidence of STEMI. Due to concern for cardiogenic shock, STEMI was activated for consideration of MCS. He was taken to the Cath lab emergently for [facility] +/- MCS with plans to transfer to [facility] post procedure.    SIGNIFICANT DIAGNOSTIC STUDIES No results found.  SIGNIFICANT EVENTS 9/20:Admit to [facility] service with Afib RVR s/p DCCV 9/21:PCCM consulted for cardiogenic shock, plan to transfer to [place] for mechanical support  MICRO DATA  9/21: MRSA PCR>neg   ANTIBIOTICS none  CONSULTS Cardiology pccm  TUBES / LINES 9/21: Left IJ   Discharge Exam: GEN: Critically ill patient, WDWN in mild distress HEENT: [place]/AT. PERRL, sclerae anicteric. HEART: Irregular rhythm, normal rate, S1, S2, no M/R/G,  LUNGS: CTAB, mild crackles without wheezes, no increased WOB,  EXTREMITIES: No Edema, cap refill  NEURO: No gross focal deficits. PSYCH:  Mood and Affect: Mood normal.  ABDOMINAL: Soft: BS x 4, NTND SKIN: Intact, warm, no rashes lesion,  or ulcer  Vitals:   12/25/23 2318 12/25/23 2323 12/25/23 2327 12/25/23 2331  BP: 120/76 124/80 113/80 113/82  Pulse: 91 87 86 86  Resp: (!) 22 18 16 20   Temp:      TempSrc:      SpO2: 94% 96% 99% 99%  Weight:      Height:       Discharge Labs  BMET No results for input(s): NA, K, CL, CO2, GLUCOSE, BUN, CREATININE, CALCIUM , MG, PHOS in the last 168 hours.  CBC No results for input(s): HGB, HCT, WBC, PLT in the last 168 hours.  Anti-Coagulation No results for input(s): INR in the last 168 hours.    Allergies as of 12/26/2023       Reactions   Lactose Intolerance (gi) Diarrhea, Other (See Comments)   Bloating,  flatulence        Medication List     ASK your doctor about these medications    albuterol  108 (90 Base) MCG/ACT inhaler Commonly known as: VENTOLIN  HFA INHALE 2 PUFFS INTO THE LUNGS EVERY 6 HOURS AS NEEDED FOR WHEEZING OR SHORTNESS OF BREATH        Disposition: [place]  Discharged Condition: Tyler Cohen. Has NOT met maximum benefit of inpatient care and is NOT medically stable and cleared for discharge.  Patient is pending discharge to [place]     Time spent on disposition:  Greater than 45 minutes.     Almarie Nose, DNP, CCRN, FNP-C, AGACNP-BC Acute Care & Family Nurse Practitioner  [facility]  See Amion for personal pager PCCM on call pager [phone] until 7 am

## 2024-01-18 ENCOUNTER — Encounter: Payer: Self-pay | Admitting: Family Medicine

## 2024-01-18 ENCOUNTER — Ambulatory Visit: Admitting: Family Medicine

## 2024-01-18 VITALS — BP 122/77 | HR 68 | Temp 97.6°F | Ht 73.0 in | Wt 160.0 lb

## 2024-01-18 DIAGNOSIS — I5022 Chronic systolic (congestive) heart failure: Secondary | ICD-10-CM

## 2024-01-18 DIAGNOSIS — N401 Enlarged prostate with lower urinary tract symptoms: Secondary | ICD-10-CM | POA: Diagnosis not present

## 2024-01-18 DIAGNOSIS — R350 Frequency of micturition: Secondary | ICD-10-CM

## 2024-01-18 DIAGNOSIS — Z23 Encounter for immunization: Secondary | ICD-10-CM

## 2024-01-18 DIAGNOSIS — E0590 Thyrotoxicosis, unspecified without thyrotoxic crisis or storm: Secondary | ICD-10-CM

## 2024-01-18 DIAGNOSIS — E7800 Pure hypercholesterolemia, unspecified: Secondary | ICD-10-CM | POA: Diagnosis not present

## 2024-01-18 LAB — MICROALBUMIN, URINE WAIVED
Creatinine, Urine Waived: 50 mg/dL (ref 10–300)
Microalb, Ur Waived: 30 mg/L — ABNORMAL HIGH (ref 0–19)

## 2024-01-18 NOTE — Progress Notes (Signed)
 BP 122/77   Pulse 68   Temp 97.6 F (36.4 C) (Oral)   Ht 6' 1 (1.854 m)   Wt 160 lb (72.6 kg)   SpO2 96%   BMI 21.11 kg/m    Subjective:    Patient ID: Tyler Cohen., male    DOB: 07-30-1955, 68 y.o.   MRN: [id number]  HPI: Kion Huntsberry. is a 68 y.o. male  Chief Complaint  Patient presents with   Hypertension   Hyperlipidemia   Transition of Care Hospital Follow up.   Hospital/Facility: [facility] Physician: Dr. Rolan D/C Date: 01/01/24  Records Requested: 01/18/24 Records Received:  01/18/24 Records Reviewed: 01/18/24  Diagnoses on Discharge:  Cardiogenic shock (HCC)   Hyperlipidemia   Tobacco abuse   Aortic atherosclerosis   Congestive heart failure (HCC)   Afib (HCC)   Mitral valve insufficiency  Date of interactive Contact within 48 hours of discharge: NOT DONE Contact was through: N/A  Date of 7 day or 14 day face-to-face visit: 01/18/24 NOT  within 14 days  Outpatient Encounter Medications as of 01/18/2024  Medication Sig   albuterol  (VENTOLIN  HFA) 108 (90 Base) MCG/ACT inhaler INHALE 2 PUFFS INTO THE LUNGS EVERY 6 HOURS AS NEEDED FOR WHEEZING OR SHORTNESS OF BREATH   amiodarone  (PACERONE ) 200 MG tablet Take 2 tablets (400 mg total) by mouth 2 (two) times daily for 7 days, THEN 1 tablet (200 mg total) daily.   apixaban  (ELIQUIS ) 5 MG TABS tablet Take 1 tablet (5 mg total) by mouth 2 (two) times daily.   atorvastatin  (LIPITOR) 20 MG tablet Take 1 tablet (20 mg total) by mouth daily.   dapagliflozin  propanediol (FARXIGA ) 10 MG TABS tablet Take 1 tablet (10 mg total) by mouth daily.   digoxin  (LANOXIN ) 0.125 MG tablet Take 1 tablet (125 mcg total) by mouth daily.   furosemide  (LASIX ) 40 MG tablet Take 0.5 tablets (20 mg total) by mouth daily.   losartan  (COZAAR ) 25 MG tablet Take 1 tablet (25 mg total) by mouth daily.   methimazole  (TAPAZOLE ) 10 MG tablet Take 1.5 tablets (15 mg total) by mouth daily.   metoprolol  succinate (TOPROL -XL) 25  MG 24 hr tablet Take 0.5 tablets (12.5 mg total) by mouth daily.   Multiple Vitamins-Minerals (MENS MULTIVITAMIN PO) Take 1 tablet by mouth daily.   spironolactone  (ALDACTONE ) 25 MG tablet Take 1 tablet (25 mg total) by mouth daily.   No facility-administered encounter medications on file as of 01/18/2024.  Per Hospitalist: He was admitted to [facility] on 05/14/2021 with multifocal bronchopneumonia and thyroid  storm felt to possibly be related to toxic multinodular goiter. Noted to be in atrial flutter with RVR with new cardiomyopathy EF 25-30% and heart failure requiring diuresis. He was noted to have moderate MR at that time with a small mobile opacity noted on anterior and posterior mitral valve leaflets, unable to exclude valve vegetations vs other etiology/valve cord. He was ultimately transferred to [facility] for endocrinology assessment and had been treated there. Subsequently s/p 131 ablation and treated with methimazole .    Cardiac CT 08/20/21 showed no CAD, calcium  score 0 and felt to have NICM. Pulmonary nodule noted.    He was in his usual state of health until early September when he developed URI-like symptoms with a runny nose, sore throat and dry cough.  He admits to taking copious amounts of Sudafed and not taking any of his prescribed medications.  He ultimately presented on 12/24/23 to [facility] with chest  pain and palpitations and found to be in atrial flutter with RVR. Echo showed EF 20-25%, severe LAE, severe MR and mild to mod TR. He was treated with IV dilt/metoprolol  for rate control and developed hypotension and cardiogenic shock. Lactic acid 2.7, creat up to 1.4, mildly elevated LFTs. TSH < 0.1, free T4 2.3. Started back on methimazole . He was started on inotropic support.  He reported chest pain in the setting of a mildly elevated HS troponin (20, 20, 13). He was taken to the cath lab emergently for [facility] 12/25/23 on 0.25 milrinone  + 6 NE which showed no CAD, RA mean 12, PA 64/40, PCWP mean 34 w/  v waves to 56, Fick CI 1.6, TD CI 2.4, CPO 0.51 by Fick and 0.75 by TD, LVEDP 24. He was transferred to [facility] for management by the advanced CHF team and IV diuresis.    TEE/DCCV attempted 12/28/23 but had early return to atrial flutter. On heparin  gtt and amiodarone  gtt and in and out of atrial flutter (currently in sinus). AV nodals being avoided. Amiodarone  not felt to be a good long term medication with chronic thyroid  disease.    TEE showed mildly dilated left ventricle with severe systolic dysfunction, EF 20-25% with global hypokinesis. Normal RV size with mild systolic dysfunction. Moderately dilated left atrium with no LA appendage thrombus. Normal right atrium. There was a small PFO by color doppler. Trivial TR. Trileaflet aortic valve with no stenosis and trivial regurgitation. Severe mitral regurgitation with PISA ERO 0.48 cm^2. MR appears functional with restriction of the posterior leaflet and malcoaptation. There was systolic flow reversal in the pulmonary vein doppler pattern.    He has been stabilized on GDMT and taken off milrinone  and levophed . TD CI 2.22 and CO-OX 67% off milrinone  this morning. Diuretics on hold. BP currently 121/87 and he is feeling symptomatically much improved.    Structural heart is consulted for consideration of mTEER. His girlfreind is present in the room and sister on speaker phone. He is currently feeling much better after IV diuresis. He currently denies CP or SOB. No LE edema, orthopnea or PND. No dizziness or syncope. No blood in stool or urine. No palpitations. He does admit to not taking his medications for an unclear amount of time and does not have a good explanation as to why. He reports no financial barriers to this.      Hospital Course   Consultants: structural heart team for MR   Acute on chronic systolic heart failure Hypertension LVEF historically in the 20-25% range.  He presented in cardiogenic shock in the setting of A-fib with RVR.  He  required milrinone  support along with norepinephrine .  He underwent right and left heart catheterization 12/25/2023 that showed no obstructive CAD.  He was weaned off of milrinone  and norepinephrine  was stable Coox stable.  GDMT includes digoxin  0.125 mg daily, increased to 25 mg spironolactone .  Entresto  was discontinued due to SBP in the 80s/90s.  To that end, losartan  25 mg daily was instituted.  He was continued on Farxiga  10 mg daily and started on 12.5 mg Toprol  today. Appears jardiance  was refilled by outside provider, instructed him to not pick this up. He reportedly was having side effects with jardiance , switched to farxiga  this admission.    Atrial flutter with RVR PAF Chronic anticoagulation Course was complicated by cardiogenic shock with RVR.  TEE guided cardioversion was attempted 12/27/23 with early return to A-fib/flutter.  He continued to alternate between NSR and A-fib/flutter.  He was treated with IV amiodarone , but this is felt not ideal given hyperthyroidism.  Options are limited in the setting of rates in cardiogenic shock.  He was transition to p.o. amiodarone  400 mg twice daily.  Will continue outpatient p.o. amiodarone  load with 400 mg p.o. twice daily x 7 days then 200 mg daily. Added toprol  12.5 mg daily.    Severe MR Suspect functional MR, confirm severe MR on TEE on 12/27/2023.  He has a restricted posterior leaflet with mild coaptation of the leaflets.  Structural heart team was consulted and will initially plan for medical management.  If he demonstrates medication compliance with persistent severe MR, will consider MitraClip.  They will follow-up outpatient.   Hyperthyroidism TSH less than 0.1, free T42.3 He follows with endocrinology at [facility].  Unclear what dose of methimazole .  He was started on 15 mg of methimazole  here.   Medication nonadherence He reportedly had not taken prescribed medications for an unclear amount of time, no financial barriers to  medications.  Diagnostic Tests Reviewed: Echo 12/25/23  1. Left ventricular ejection fraction, by estimation, is 20 to 25%. Left  ventricular ejection fraction by PLAX is 21 %. The left ventricle has  severely decreased function. The left ventricle demonstrates global  hypokinesis. Left ventricular diastolic  parameters are indeterminate.   2. Right ventricular systolic function is normal. The right ventricular  size is normal.   3. Left atrial size was severely dilated.   4. The mitral valve is abnormal. Severe mitral valve regurgitation. No  evidence of mitral stenosis.   5. Tricuspid valve regurgitation is mild to moderate.   6. The aortic valve is normal in structure. Aortic valve regurgitation is  trivial. No aortic stenosis is present.   7. The inferior vena cava is normal in size with greater than 50%  respiratory variability, suggesting right atrial pressure of 3 mmHg.      R/L [facility] 12/25/23: 1.  Normal right dominant circulation with no obstructive coronary artery disease. 2.  Metabolic acidosis treated with 3 amps of bicarbonate with subsequent normalization of ABG. 3.  With the patient on milrinone  0.25 mcg/kilogram/minute and norepinephrine  6 mcg/min, the following hemodynamically was obtained;            Fick cardiac output of 3.2 L/min and Fick cardiac index of 1.6 L/min/m            Thermodilution cardiac output of 4.8 L/min/m with thermodilution cardiac index of 2.4 L/min/m                       Right atrial pressure of mean of 12 with V waves to 15 mmHg                       PA pressure of 64/40 with a mean of 37 mmHg                       Wedge pressure mean of 34 mmHg with V waves to 56 mmHg                       PVR of 0.92 by Fick and 0.62 by TD                       PA pulsatility index of 2  CPO of 0.51 by Fick and 0.75 by TD 4.  LVEDP of 24 mmHg 5.  Successful left radial arterial line placement. 6.  Capacious iliofemoral vessels  bilaterally  Disposition: Home  Consults: Cardiology  Discharge Instructions: Follow up here and with advanced heart failure clinic  Disease/illness Education:  Discussed today  Home Health/Community Services Discussions/Referrals:  N/A  Establishment or re-establishment of referral orders for community resources: N/A  Discussion with other health care providers: None  Assessment and Support of treatment regimen adherence: Fair  Appointments Coordinated with: Patient  Education for self-management, independent living, and ADLs: Discussed today.   Since he's gotten out of the hospital, he has been feeling all right as long as he's moving around. He denies any chest pain or SOB. No swelling. He has been taking his medicine. He has not had follow up scheduled. He has been a little fatigued. He is otherwise doing well. No other concerns or complaints at this time.    Relevant past medical, surgical, family and social history reviewed and updated as indicated. Interim medical history since our last visit reviewed. Allergies and medications reviewed and updated.  Review of Systems  Constitutional: Negative.   Respiratory: Negative.    Cardiovascular: Negative.   Gastrointestinal: Negative.   Genitourinary: Negative.   Musculoskeletal: Negative.   Neurological: Negative.   Psychiatric/Behavioral: Negative.      Per HPI unless specifically indicated above     Objective:    BP 122/77   Pulse 68   Temp 97.6 F (36.4 C) (Oral)   Ht 6' 1 (1.854 m)   Wt 160 lb (72.6 kg)   SpO2 96%   BMI 21.11 kg/m   Wt Readings from Last 3 Encounters:  01/18/24 160 lb (72.6 kg)  01/01/24 160 lb 0.9 oz (72.6 kg)  12/24/23 165 lb (74.8 kg)    Physical Exam Vitals and nursing note reviewed.  Constitutional:      General: He is not in acute distress.    Appearance: Normal appearance. He is normal weight. He is not ill-appearing, toxic-appearing or diaphoretic.  HENT:     Head:  Normocephalic and atraumatic.     Right Ear: External ear normal.     Left Ear: External ear normal.     Nose: Nose normal.     Mouth/Throat:     Mouth: Mucous membranes are moist.     Pharynx: Oropharynx is clear.  Eyes:     General: No scleral icterus.       Right eye: No discharge.        Left eye: No discharge.     Extraocular Movements: Extraocular movements intact.     Conjunctiva/sclera: Conjunctivae normal.     Pupils: Pupils are equal, round, and reactive to light.  Cardiovascular:     Rate and Rhythm: Normal rate and regular rhythm.     Pulses: Normal pulses.     Heart sounds: Murmur heard.     No friction rub. No gallop.  Pulmonary:     Effort: Pulmonary effort is normal. No respiratory distress.     Breath sounds: Normal breath sounds. No stridor. No wheezing, rhonchi or rales.  Chest:     Chest wall: No tenderness.  Musculoskeletal:        General: Normal range of motion.     Cervical back: Normal range of motion and neck supple.  Skin:    General: Skin is warm and dry.     Capillary Refill: Capillary refill takes  less than 2 seconds.     Coloration: Skin is not jaundiced or pale.     Findings: No bruising, erythema, lesion or rash.  Neurological:     General: No focal deficit present.     Mental Status: He is alert and oriented to person, place, and time. Mental status is at baseline.  Psychiatric:        Mood and Affect: Mood normal.        Behavior: Behavior normal.        Thought Content: Thought content normal.        Judgment: Judgment normal.     Results for orders placed or performed in visit on 01/18/24  Microalbumin, Urine Waived   Collection Time: 01/18/24 10:59 AM  Result Value Ref Range   Microalb, Ur Waived 30 (H) 0 - 19 mg/L   Creatinine, Urine Waived 50 10 - 300 mg/dL   Microalb/Creat Ratio 30-300 (H) <30 mg/g  CBC with Differential/Platelet   Collection Time: 01/18/24 11:00 AM  Result Value Ref Range   WBC 5.5 3.4 - 10.8 x10E3/uL    RBC 4.68 4.14 - 5.80 x10E6/uL   Hemoglobin 15.1 13.0 - 17.7 g/dL   Hematocrit 53.5 62.4 - 51.0 %   MCV 99 (H) 79 - 97 fL   MCH 32.3 26.6 - 33.0 pg   MCHC 32.5 31.5 - 35.7 g/dL   RDW 87.1 88.3 - 84.5 %   Platelets 189 150 - 450 x10E3/uL   Neutrophils 72 Not Estab. %   Lymphs 17 Not Estab. %   Monocytes 8 Not Estab. %   Eos 2 Not Estab. %   Basos 1 Not Estab. %   Neutrophils Absolute 4.0 1.4 - 7.0 x10E3/uL   Lymphocytes Absolute 0.9 0.7 - 3.1 x10E3/uL   Monocytes Absolute 0.4 0.1 - 0.9 x10E3/uL   EOS (ABSOLUTE) 0.1 0.0 - 0.4 x10E3/uL   Basophils Absolute 0.0 0.0 - 0.2 x10E3/uL   Immature Granulocytes 0 Not Estab. %   Immature Grans (Abs) 0.0 0.0 - 0.1 x10E3/uL  Comprehensive metabolic panel with GFR   Collection Time: 01/18/24 11:00 AM  Result Value Ref Range   Glucose 98 70 - 99 mg/dL   BUN 19 8 - 27 mg/dL   Creatinine, Ser 8.45 (H) 0.76 - 1.27 mg/dL   eGFR 49 (L) >40 fO/fpw/8.26   BUN/Creatinine Ratio 12 10 - 24   Sodium 135 134 - 144 mmol/L   Potassium 4.4 3.5 - 5.2 mmol/L   Chloride 98 96 - 106 mmol/L   CO2 20 20 - 29 mmol/L   Calcium  9.3 8.6 - 10.2 mg/dL   Total Protein 7.1 6.0 - 8.5 g/dL   Albumin 4.5 3.9 - 4.9 g/dL   Globulin, Total 2.6 1.5 - 4.5 g/dL   Bilirubin Total 0.8 0.0 - 1.2 mg/dL   Alkaline Phosphatase 82 47 - 123 IU/L   AST 19 0 - 40 IU/L   ALT 25 0 - 44 IU/L  Thyroid  Panel With TSH   Collection Time: 01/18/24 11:00 AM  Result Value Ref Range   TSH 1.770 0.450 - 4.500 uIU/mL   T4, Total 7.4 4.5 - 12.0 ug/dL   T3 Uptake Ratio 25 24 - 39 %   Free Thyroxine Index 1.9 1.2 - 4.9  Lipid Panel w/o Chol/HDL Ratio   Collection Time: 01/18/24 11:00 AM  Result Value Ref Range   Cholesterol, Total 210 (H) 100 - 199 mg/dL   Triglycerides 87 0 - 149 mg/dL  HDL 78 >39 mg/dL   VLDL Cholesterol Cal 15 5 - 40 mg/dL   LDL Chol Calc (NIH) 882 (H) 0 - 99 mg/dL  PSA   Collection Time: 01/18/24 11:00 AM  Result Value Ref Range   Prostate Specific Ag, Serum 0.2 0.0 -  4.0 ng/mL      Assessment & Plan:   Problem List Items Addressed This Visit       Cardiovascular and Mediastinum   Congestive heart failure (HCC)   Euvolemic today. Continue to follow with cardiology. Call with any concerns.       Relevant Orders   Microalbumin, Urine Waived (Completed)     Endocrine   Hyperthyroidism   Rechecking labs today. Continue to follow with endocriology. Call with any concerns.       Relevant Orders   CBC with Differential/Platelet (Completed)   Comprehensive metabolic panel with GFR (Completed)   Thyroid  Panel With TSH (Completed)     Genitourinary   BPH (benign prostatic hyperplasia)   Under good control on current regimen. Continue current regimen. Continue to monitor. Call with any concerns. Refills given. Labs drawn today.        Relevant Orders   CBC with Differential/Platelet (Completed)   Comprehensive metabolic panel with GFR (Completed)   PSA (Completed)     Other   Hyperlipidemia - Primary   Under good control on current regimen. Continue current regimen. Continue to monitor. Call with any concerns. Refills given. Labs drawn today.        Relevant Orders   CBC with Differential/Platelet (Completed)   Comprehensive metabolic panel with GFR (Completed)   Lipid Panel w/o Chol/HDL Ratio (Completed)   Other Visit Diagnoses       Needs flu shot       Flu shot given today.   Relevant Orders   Flu vaccine HIGH DOSE PF(Fluzone Trivalent) (Completed)        Follow up plan: Return in about 4 weeks (around 02/15/2024).  >25 minutes spent with patient today.

## 2024-01-19 LAB — COMPREHENSIVE METABOLIC PANEL WITH GFR
ALT: 25 IU/L (ref 0–44)
AST: 19 IU/L (ref 0–40)
Albumin: 4.5 g/dL (ref 3.9–4.9)
Alkaline Phosphatase: 82 IU/L (ref 47–123)
BUN/Creatinine Ratio: 12 (ref 10–24)
BUN: 19 mg/dL (ref 8–27)
Bilirubin Total: 0.8 mg/dL (ref 0.0–1.2)
CO2: 20 mmol/L (ref 20–29)
Calcium: 9.3 mg/dL (ref 8.6–10.2)
Chloride: 98 mmol/L (ref 96–106)
Creatinine, Ser: 1.54 mg/dL — ABNORMAL HIGH (ref 0.76–1.27)
Globulin, Total: 2.6 g/dL (ref 1.5–4.5)
Glucose: 98 mg/dL (ref 70–99)
Potassium: 4.4 mmol/L (ref 3.5–5.2)
Sodium: 135 mmol/L (ref 134–144)
Total Protein: 7.1 g/dL (ref 6.0–8.5)
eGFR: 49 mL/min/1.73 — ABNORMAL LOW (ref >59)

## 2024-01-19 LAB — CBC WITH DIFFERENTIAL/PLATELET
Basophils Absolute: 0 x10E3/uL (ref 0.0–0.2)
Basos: 1 %
EOS (ABSOLUTE): 0.1 x10E3/uL (ref 0.0–0.4)
Eos: 2 %
Hematocrit: 46.4 % (ref 37.5–51.0)
Hemoglobin: 15.1 g/dL (ref 13.0–17.7)
Immature Grans (Abs): 0 x10E3/uL (ref 0.0–0.1)
Immature Granulocytes: 0 %
Lymphocytes Absolute: 0.9 x10E3/uL (ref 0.7–3.1)
Lymphs: 17 %
MCH: 32.3 pg (ref 26.6–33.0)
MCHC: 32.5 g/dL (ref 31.5–35.7)
MCV: 99 fL — ABNORMAL HIGH (ref 79–97)
Monocytes Absolute: 0.4 x10E3/uL (ref 0.1–0.9)
Monocytes: 8 %
Neutrophils Absolute: 4 x10E3/uL (ref 1.4–7.0)
Neutrophils: 72 %
Platelets: 189 x10E3/uL (ref 150–450)
RBC: 4.68 x10E6/uL (ref 4.14–5.80)
RDW: 12.8 % (ref 11.6–15.4)
WBC: 5.5 x10E3/uL (ref 3.4–10.8)

## 2024-01-19 LAB — LIPID PANEL W/O CHOL/HDL RATIO
Cholesterol, Total: 210 mg/dL — ABNORMAL HIGH (ref 100–199)
HDL: 78 mg/dL (ref >39)
LDL Chol Calc (NIH): 117 mg/dL — ABNORMAL HIGH (ref 0–99)
Triglycerides: 87 mg/dL (ref 0–149)
VLDL Cholesterol Cal: 15 mg/dL (ref 5–40)

## 2024-01-19 LAB — THYROID PANEL WITH TSH
Free Thyroxine Index: 1.9 (ref 1.2–4.9)
T3 Uptake Ratio: 25 % (ref 24–39)
T4, Total: 7.4 ug/dL (ref 4.5–12.0)
TSH: 1.77 u[IU]/mL (ref 0.450–4.500)

## 2024-01-19 LAB — PSA: Prostate Specific Ag, Serum: 0.2 ng/mL (ref 0.0–4.0)

## 2024-01-20 ENCOUNTER — Ambulatory Visit: Payer: Self-pay | Admitting: Family Medicine

## 2024-01-22 ENCOUNTER — Encounter: Payer: Self-pay | Admitting: Family Medicine

## 2024-01-22 NOTE — Assessment & Plan Note (Signed)
Euvolemic today. Continue to follow with cardiology. Call with any concerns.  

## 2024-01-22 NOTE — Assessment & Plan Note (Signed)
 Rechecking labs today. Continue to follow with endocriology. Call with any concerns.

## 2024-01-22 NOTE — Assessment & Plan Note (Signed)
 Under good control on current regimen. Continue current regimen. Continue to monitor. Call with any concerns. Refills given. Labs drawn today.

## 2024-01-24 ENCOUNTER — Ambulatory Visit (HOSPITAL_COMMUNITY): Admitting: Cardiology

## 2024-01-26 ENCOUNTER — Ambulatory Visit (HOSPITAL_COMMUNITY)
Admission: RE | Admit: 2024-01-26 | Discharge: 2024-01-26 | Disposition: A | Discharge status: Home / Self Care | Source: Ambulatory Visit | Attending: Cardiology | Admitting: Cardiology

## 2024-01-26 ENCOUNTER — Encounter: Payer: Self-pay | Admitting: *Deleted

## 2024-01-26 ENCOUNTER — Ambulatory Visit (HOSPITAL_COMMUNITY): Payer: Self-pay | Admitting: Cardiology

## 2024-01-26 ENCOUNTER — Telehealth (HOSPITAL_COMMUNITY): Payer: Self-pay

## 2024-01-26 ENCOUNTER — Other Ambulatory Visit (HOSPITAL_COMMUNITY): Payer: Self-pay | Admitting: *Deleted

## 2024-01-26 ENCOUNTER — Other Ambulatory Visit (HOSPITAL_COMMUNITY): Payer: Self-pay

## 2024-01-26 VITALS — BP 118/70 | HR 81 | Wt 161.0 lb

## 2024-01-26 DIAGNOSIS — Z006 Encounter for examination for normal comparison and control in clinical research program: Secondary | ICD-10-CM

## 2024-01-26 DIAGNOSIS — I5042 Chronic combined systolic (congestive) and diastolic (congestive) heart failure: Secondary | ICD-10-CM

## 2024-01-26 DIAGNOSIS — I4819 Other persistent atrial fibrillation: Secondary | ICD-10-CM | POA: Diagnosis not present

## 2024-01-26 DIAGNOSIS — I5022 Chronic systolic (congestive) heart failure: Secondary | ICD-10-CM | POA: Diagnosis not present

## 2024-01-26 DIAGNOSIS — I081 Rheumatic disorders of both mitral and tricuspid valves: Secondary | ICD-10-CM | POA: Insufficient documentation

## 2024-01-26 DIAGNOSIS — Z7984 Long term (current) use of oral hypoglycemic drugs: Secondary | ICD-10-CM | POA: Insufficient documentation

## 2024-01-26 DIAGNOSIS — Z87891 Personal history of nicotine dependence: Secondary | ICD-10-CM | POA: Insufficient documentation

## 2024-01-26 DIAGNOSIS — I5020 Unspecified systolic (congestive) heart failure: Secondary | ICD-10-CM | POA: Diagnosis not present

## 2024-01-26 DIAGNOSIS — Z79899 Other long term (current) drug therapy: Secondary | ICD-10-CM | POA: Insufficient documentation

## 2024-01-26 DIAGNOSIS — E785 Hyperlipidemia, unspecified: Secondary | ICD-10-CM | POA: Diagnosis not present

## 2024-01-26 DIAGNOSIS — I480 Paroxysmal atrial fibrillation: Secondary | ICD-10-CM | POA: Diagnosis not present

## 2024-01-26 DIAGNOSIS — I2510 Atherosclerotic heart disease of native coronary artery without angina pectoris: Secondary | ICD-10-CM | POA: Diagnosis not present

## 2024-01-26 LAB — CBC
HCT: 46.8 % (ref 39.0–52.0)
Hemoglobin: 15.6 g/dL (ref 13.0–17.0)
MCH: 32.4 pg (ref 26.0–34.0)
MCHC: 33.3 g/dL (ref 30.0–36.0)
MCV: 97.1 fL (ref 80.0–100.0)
Platelets: 179 K/uL (ref 150–400)
RBC: 4.82 MIL/uL (ref 4.22–5.81)
RDW: 13.1 % (ref 11.5–15.5)
WBC: 7.7 K/uL (ref 4.0–10.5)
nRBC: 0 % (ref 0.0–0.2)

## 2024-01-26 LAB — COMPREHENSIVE METABOLIC PANEL WITH GFR
ALT: 19 U/L (ref 0–44)
AST: 17 U/L (ref 15–41)
Albumin: 4.1 g/dL (ref 3.5–5.0)
Alkaline Phosphatase: 59 U/L (ref 38–126)
Anion gap: 11 (ref 5–15)
BUN: 20 mg/dL (ref 8–23)
CO2: 28 mmol/L (ref 22–32)
Calcium: 9.5 mg/dL (ref 8.9–10.3)
Chloride: 100 mmol/L (ref 98–111)
Creatinine, Ser: 1.7 mg/dL — ABNORMAL HIGH (ref 0.61–1.24)
GFR, Estimated: 43 mL/min — ABNORMAL LOW (ref >60)
Glucose, Bld: 95 mg/dL (ref 70–99)
Potassium: 4.6 mmol/L (ref 3.5–5.1)
Sodium: 139 mmol/L (ref 135–145)
Total Bilirubin: 0.7 mg/dL (ref 0.0–1.2)
Total Protein: 7.5 g/dL (ref 6.5–8.1)

## 2024-01-26 LAB — TSH: TSH: 2.605 u[IU]/mL (ref 0.350–4.500)

## 2024-01-26 LAB — DIGOXIN LEVEL: Digoxin Level: 1.1 ng/mL (ref 0.8–2.0)

## 2024-01-26 MED ORDER — ENTRESTO 24-26 MG PO TABS: 1.0000 | ORAL_TABLET | Freq: Two times a day (BID) | ORAL | 3 refills | Status: DC

## 2024-01-26 NOTE — Research (Signed)
 Spoke with Tyler Cohen about Comet study. He took the consent to review. Encouraged him to call with any question.

## 2024-01-26 NOTE — Telephone Encounter (Signed)
 Advanced Heart Failure Patient Advocate Encounter  Test billing for this patient's current coverage ([employer] MPD) returns a $0 copay for 90 day supply of Entresto  (DAW 9).  This test claim was processed through [facility]- copay amounts may vary at other pharmacies due to pharmacy/plan contracts, or as the patient moves through the different stages of their insurance plan.  Rachel DEL, CPhT Rx Patient Advocate Phone: [phone]

## 2024-01-26 NOTE — Patient Instructions (Signed)
 Medication Changes:  STOP Furosemide   STOP Losartan   START Entresto  24/26 mg Twice daily   Lab Work:  Labs done today, your results will be available in MyChart, we will contact you for abnormal readings.  Your provider would like for you to return in 1-2 weeks to have repeat lab work.  Please go to [facility] [address] ([facility] [address], [place] [address] You do not need an appointment.  They are open from 8 am- 4:30 pm.  Lunch from 1:00 pm- 2:00 pm   Referrals:  You have been referred to EP to discuss possible Afib ablation, they will call you to schedule  You have been referred to Cardiac Rehab at [facility], they will call you to schedule  Special Instructions // Education:  Do the following things EVERYDAY: Weigh yourself in the morning before breakfast. Write it down and keep it in a log. Take your medicines as prescribed Eat low salt foods--Limit salt (sodium) to 2000 mg per day.  Stay as active as you can everyday Limit all fluids for the day to less than 2 liters   Follow-Up in: 3 weeks and again in 6 weeks at our [facility]   At the Advanced Heart Failure Clinic, you and your health needs are our priority. We have a designated team specialized in the treatment of Heart Failure. This Care Team includes your primary Heart Failure Specialized Cardiologist (physician), Advanced Practice Providers (APPs- Physician Assistants and Nurse Practitioners), and Pharmacist who all work together to provide you with the care you need, when you need it.   You may see any of the following providers on your designated Care Team at your next follow up:  Dr. Toribio Fuel Dr. [name] Dr. Ria Commander Dr. Odis Brownie Greig Mosses, NP Caffie Shed, [place] [facility] [place], [place] Beckey Coe, NP [place] Lee, NP Tinnie Redman, PharmD   Please be sure to bring in all your medications bottles to every appointment.    Need to Contact Us :  If you have any questions or concerns before your next appointment please send us  a message through [place] or call our office at [phone] or [phone]  TO LEAVE A MESSAGE FOR THE NURSE SELECT OPTION 2, PLEASE LEAVE A MESSAGE INCLUDING: YOUR NAME DATE OF BIRTH CALL BACK NUMBER REASON FOR CALL**this is important as we prioritize the call backs  YOU WILL RECEIVE A CALL BACK THE SAME DAY AS LONG AS YOU CALL BEFORE 4:00 PM

## 2024-01-29 NOTE — Progress Notes (Signed)
 PCP: Vicci Duwaine SQUIBB, DO HF Cardiology: Dr. Rolan  Chief complaint: CHF  68 y.o. with history of atrial fibrillation, hyperthyroidism, nonischemic cardiomyopathy, and mitral regurgitation presents for followup of CHF.  Echo in 2/23 showed EF 25-30%.  Patient was admitted in 9/25 with atrial flutter with RVR, volume overload, cardiogenic shock.  Echo showed EF 20-25%, normal RV function, severe LAE, severe MR, mild-moderate MR. He was started on milrinone  and norepinephrine  for cardiogenic shock.  RHC/LHC showed no obstructive CAD, volume overload, low output HF.  He was diuresed and transitioned off milrinone . He was cardioverted back to NSR. TEE showed severe functional MR, seen by structural heart team with suggestion being initial medical management with consideration for mTEER if MR remained severe despite medical management.   He is in NSR today, denies palpitations or lightheadedness.  No dyspnea with usual activities now, no problems walking up a flight of stairs.  No chest pain.  No orthopnea/PND.  He says that he is generally feeling good   Labs (10/25): LDL 117, TSH 1.77, T4 normal, K 4.4, creatinine 1.54  ECG (personally reviewed): NSR, LVH, nonspecific T wave changes.   PMH: 1. Prostate cancer s/p radiation therapy.  2. Hyperthyroidism: s/p RAI ablation.  3. Chronic systolic CHF: Nonischemic cardiomyopathy.  - Echo (2/23): EF 25-30%.  - Echo (9/25): EF 20-25%, normal RV function, severe LAE, severe MR, mild-moderate TR. - LHC/RHC (9/25): Nonobstructive CAD; mean RA 12, PA 64/40 mean 37, PCWP 34, CI 1.6 (Fick), CI 2.4 (thermo).  - TEE (9/25): EF 20-25%, normal RV size/mild systolic dysfunction, small PFO, severe functional MR with PISA ERO 0.48 cm^2, restricted posterior leaflet.  4. Mitral regurgitation: Functional MR.  Severe on 9/25 TEE.  5. Atrial fibrillation: Paroxysmal.  6. Hyperlipidemia  Social History   Socioeconomic History   Marital status: Married    Spouse  name: Not on file   Number of children: Not on file   Years of education: Not on file   Highest education level: High school graduate  Occupational History   Not on file  Tobacco Use   Smoking status: Former    Current packs/day: 0.00    Average packs/day: 0.3 packs/day for 42.0 years (10.5 ttl pk-yrs)    Types: Cigars, Cigarettes    Start date: 05/18/1979    Quit date: 05/17/2021    Years since quitting: 2.7   Smokeless tobacco: Never  Vaping Use   Vaping status: Never Used  Substance and Sexual Activity   Alcohol use: No   Drug use: Yes    Types: Marijuana    Comment: pt states he smokes every once in a while   Sexual activity: Yes    Birth control/protection: None  Other Topics Concern   Not on file  Social History Narrative   Works part time.   Social Drivers of Corporate Investment Banker Strain: Low Risk  (05/23/2023)   Received from [facility]   Overall Financial Resource Strain (CARDIA)    Difficulty of Paying Living Expenses: Not hard at all  Food Insecurity: No Food Insecurity (12/26/2023)   Hunger Vital Sign    Worried About Running Out of Food in the Last Year: Never true    Ran Out of Food in the Last Year: Never true  Transportation Needs: No Transportation Needs (12/26/2023)   PRAPARE - Administrator, Civil Service (Medical): No    Lack of Transportation (Non-Medical): No  Physical Activity: Insufficiently Active (01/12/2022)  Exercise Vital Sign    Days of Exercise per Week: 3 days    Minutes of Exercise per Session: 30 min  Stress: Stress Concern Present (01/12/2022)   [employer] of Occupational Health - Occupational Stress Questionnaire    Feeling of Stress : To some extent  Social Connections: Moderately Integrated (12/26/2023)   Social Connection and Isolation Panel    Frequency of Communication with Friends and Family: Twice a week    Frequency of Social Gatherings with Friends and Family: Once a week     Attends Religious Services: More than 4 times per year    Active Member of [employer] or Organizations: Yes    Attends Engineer, Structural: More than 4 times per year    Marital Status: Never married  Recent Concern: Social Connections - Moderately Isolated (12/24/2023)   Social Connection and Isolation Panel    Frequency of Communication with Friends and Family: Twice a week    Frequency of Social Gatherings with Friends and Family: Never    Attends Religious Services: More than 4 times per year    Active Member of [employer] or Organizations: Yes    Attends Engineer, Structural: More than 4 times per year    Marital Status: Never married  Intimate Partner Violence: Not At Risk (12/26/2023)   Humiliation, Afraid, Rape, and Kick questionnaire    Fear of Current or Ex-Partner: No    Emotionally Abused: No    Physically Abused: No    Sexually Abused: No   Family History  Problem Relation Age of Onset   Hypertension Mother    Diabetes Mother        lost both legs   Kidney disease Mother    Hypertension Father    Emphysema Father    Sickle cell trait Father    Hypertension Brother    Hyperlipidemia Brother    Sickle cell trait Sister    ROS: All systems reviewed and negative except as per HPI.   Current Outpatient Medications  Medication Sig Dispense Refill   albuterol  (VENTOLIN  HFA) 108 (90 Base) MCG/ACT inhaler INHALE 2 PUFFS INTO THE LUNGS EVERY 6 HOURS AS NEEDED FOR WHEEZING OR SHORTNESS OF BREATH 8.5 g 3   amiodarone  (PACERONE ) 200 MG tablet Take 2 tablets (400 mg total) by mouth 2 (two) times daily for 7 days, THEN 1 tablet (200 mg total) daily. 118 tablet 0   apixaban  (ELIQUIS ) 5 MG TABS tablet Take 1 tablet (5 mg total) by mouth 2 (two) times daily. 180 tablet 3   atorvastatin  (LIPITOR) 20 MG tablet Take 1 tablet (20 mg total) by mouth daily. 90 tablet 3   dapagliflozin  propanediol (FARXIGA ) 10 MG TABS tablet Take 1 tablet (10 mg total) by mouth daily. 90 tablet 3    digoxin  (LANOXIN ) 0.125 MG tablet Take 1 tablet (125 mcg total) by mouth daily. 90 tablet 3   ENTRESTO  24-26 MG Take 1 tablet by mouth 2 (two) times daily. 60 tablet 3   methimazole  (TAPAZOLE ) 10 MG tablet Take 1.5 tablets (15 mg total) by mouth daily. 45 tablet 6   metoprolol  succinate (TOPROL -XL) 25 MG 24 hr tablet Take 0.5 tablets (12.5 mg total) by mouth daily. 45 tablet 3   Multiple Vitamins-Minerals (MENS MULTIVITAMIN PO) Take 1 tablet by mouth daily.     spironolactone  (ALDACTONE ) 25 MG tablet Take 1 tablet (25 mg total) by mouth daily. 90 tablet 3   No current facility-administered medications for this encounter.  BP 118/70   Pulse 81   Wt 73 kg (161 lb)   SpO2 98%   BMI 21.24 kg/m  General: NAD Neck: No JVD, no thyromegaly or thyroid  nodule.  Lungs: Clear to auscultation bilaterally with normal respiratory effort. CV: Nondisplaced PMI.  Heart regular S1/S2, no S3/S4, 1/6 HSM apex.  No peripheral edema.  No carotid bruit.  Normal pedal pulses.  Abdomen: Soft, nontender, no hepatosplenomegaly, no distention.  Skin: Intact without lesions or rashes.  Neurologic: Alert and oriented x 3.  Psych: Normal affect. Extremities: No clubbing or cyanosis.  HEENT: Normal.   Assessment/Plan: 1. Chronic systolic CHF: Nonischemic cardiomyopathy.  Admitted in 9/25 with cardiogenic shock in setting of AF/RVR. Echo in 9/25 showed EF 20-25%, normal RV function, severe LAE, severe MR, mild-moderate TR. RHC/LHC in 9/25 showed mild nonobstructive CAD, low cardiac output, high filling pressures. Patient was diuresed and weaned off milrinone , then cardioverted to NSR.  NYHA class II symptoms currently, not volume overloaded on exam. Plan to titrate meds then repeat echo to decide on ICD as well as need for mTEER.  - Continue Toprol  XL 12.5 mg daily.  - Stop Lasix  and losartan , start Entresto  24/26 bid.  BMET/BNP today, BMET in 10 days.  - Continue digoxin  0.125, check level today.  - Continue  spironolactone  25 daily - Continue Farxiga  10 daily.  - Repeat echo in 3 months after med titration to decide on ICD and mTEER.  Narrow QRS so not CRT candidate.  - Refer to cardiac rehab at [facility] 2. Mitral regurgitation: Severe functional MR on 9/25 TEE.  He would be a potential mTEER candidate.  Will reassess MR on good medical therapy before deciding on mTEER.  - Repeat echo in 3 months.  3. Atrial fibrillation: Paroxysmal.  AF in 9/25 led to CHF decompensation, need to keep in NSR. He is currently in NSR on amiodarone .  - Continue amiodarone  200 daily.  Check LFTs and TSH today.  Amiodarone  is not a good long-term treatment for this patient due to h/o hyperthyroidism.  - I will refer to EP for AF ablation, would like to see him off amiodarone .  4. Hyperthyroidism: S/p RAI ablation. He is on methimazole .  Recent TSH mildly low but free T4 normal.  He is on amiodarone  to try to maintain NSR, so risk for worsening of thyroid  abnormalities.  Important to stay in NSR, as above will get him in with EP to consider ablation so we can get him off amiodarone .  - Check TSH, free T4 today.  - Continue methimazole .  - Continue followup with endocrinology.   Followup with NP Hackney in 3 wks, see me in 6 wks.   I spent 41 minutes reviewing records, interviewing/examining patient, and managing orders.   [name] 01/29/2024

## 2024-02-01 ENCOUNTER — Other Ambulatory Visit: Payer: Self-pay | Admitting: Family Medicine

## 2024-02-02 ENCOUNTER — Telehealth (HOSPITAL_COMMUNITY): Payer: Self-pay | Admitting: *Deleted

## 2024-02-02 NOTE — Telephone Encounter (Signed)
 Called patient per Dr. Rolan with following lab results and instructions:  Please get repeat digoxin  level as trough with next labs.  Pt has labs scheduled next week at [facility] and he will hold his Digoxin  the morning before labs are drawn.

## 2024-02-02 NOTE — Telephone Encounter (Signed)
 This Rx was sent in for a year in September by his cardiologist- has he not been able to get it?

## 2024-02-02 NOTE — Telephone Encounter (Unsigned)
 Copied from CRM [id number]. Topic: Clinical - Medication Refill >> Feb 02, 2024  2:31 PM Olam RAMAN wrote: Medication: apixaban  (ELIQUIS ) 5 MG TABS tablet   Has the patient contacted their pharmacy? Yes (Agent: If no, request that the patient contact the pharmacy for the refill. If patient does not wish to contact the pharmacy document the reason why and proceed with request.) (Agent: If yes, when and what did the pharmacy advise?)  This is the patient's preferred pharmacy:  [facility] - [place], [place] - [address] [place] [address] Phone: [phone] Fax: [phone]  Is this the correct pharmacy for this prescription? Yes If no, delete pharmacy and type the correct one.   Has the prescription been filled recently? Yes  Is the patient out of the medication? Yes  Has the patient been seen for an appointment in the last year OR does the patient have an upcoming appointment? No  Can we respond through MyChart? No  Agent: Please be advised that Rx refills may take up to 3 business days. We ask that you follow-up with your pharmacy.

## 2024-02-03 ENCOUNTER — Other Ambulatory Visit: Payer: Self-pay

## 2024-02-03 ENCOUNTER — Other Ambulatory Visit (HOSPITAL_COMMUNITY): Payer: Self-pay

## 2024-02-03 NOTE — Telephone Encounter (Signed)
 Requested medication (s) are due for refill today: Yes  Requested medication (s) are on the active medication list: Yes  Last refill:    Future visit scheduled:   Notes to clinic:  Last filled by different provider.    Requested Prescriptions  Pending Prescriptions Disp Refills   spironolactone  (ALDACTONE ) 25 MG tablet [Pharmacy Med Name: SPIRONOLACTONE  25 MG TAB] 45 tablet     Sig: TAKE 1/2 TABLET BY MOUTH ONCE DAILY     Cardiovascular: Diuretics - Aldosterone Antagonist Failed - 02/03/2024  1:19 PM      Failed - Cr in normal range and within 180 days    Creatinine, Ser  Date Value Ref Range Status  01/26/2024 1.70 (H) 0.61 - 1.24 mg/dL Final         Passed - K in normal range and within 180 days    Potassium  Date Value Ref Range Status  01/26/2024 4.6 3.5 - 5.1 mmol/L Final         Passed - Na in normal range and within 180 days    Sodium  Date Value Ref Range Status  01/26/2024 139 135 - 145 mmol/L Final  01/18/2024 135 134 - 144 mmol/L Final         Passed - eGFR is 30 or above and within 180 days    GFR calc Af Amer  Date Value Ref Range Status  02/25/2020 104 >59 mL/min/1.73 Final    Comment:    **In accordance with recommendations from the NKF-ASN Task force,**   [facility] is in the process of updating its eGFR calculation to the   2021 CKD-EPI creatinine equation that estimates kidney function   without a race variable.    GFR, Estimated  Date Value Ref Range Status  01/26/2024 43 (L) >60 mL/min Final    Comment:    (NOTE) Calculated using the CKD-EPI Creatinine Equation (2021)    eGFR  Date Value Ref Range Status  01/18/2024 49 (L) >59 mL/min/1.73 Final         Passed - Last BP in normal range    BP Readings from Last 1 Encounters:  01/26/24 118/70         Passed - Valid encounter within last 6 months    Recent Outpatient Visits           2 weeks ago Pure hypercholesterolemia   [place] [facility] [place], Megan P, DO    7 months ago Encounter for [employer] annual wellness exam   [place] [facility] [place], Megan P, DO   9 months ago Preop exam for internal medicine   [place] [facility], Megan P, DO               ELIQUIS  5 MG TABS tablet [Pharmacy Med Name: ELIQUIS  5 MG TAB] 180 tablet 3    Sig: TAKE 1 TABLET BY MOUTH TWICE DAILY     Hematology:  Anticoagulants - apixaban  Failed - 02/03/2024  1:19 PM      Failed - Cr in normal range and within 360 days    Creatinine, Ser  Date Value Ref Range Status  01/26/2024 1.70 (H) 0.61 - 1.24 mg/dL Final         Passed - PLT in normal range and within 360 days    Platelets  Date Value Ref Range Status  01/26/2024 179 150 - 400 K/uL Final  01/18/2024 189 150 - 450 x10E3/uL Final  Passed - HGB in normal range and within 360 days    Hemoglobin  Date Value Ref Range Status  01/26/2024 15.6 13.0 - 17.0 g/dL Final  89/84/7974 84.8 13.0 - 17.7 g/dL Final   Total hemoglobin  Date Value Ref Range Status  01/01/2024 12.5 12.0 - 16.0 g/dL Final         Passed - HCT in normal range and within 360 days    HCT  Date Value Ref Range Status  01/26/2024 46.8 39.0 - 52.0 % Final   Hematocrit  Date Value Ref Range Status  01/18/2024 46.4 37.5 - 51.0 % Final         Passed - AST in normal range and within 360 days    AST  Date Value Ref Range Status  01/26/2024 17 15 - 41 U/L Final         Passed - ALT in normal range and within 360 days    ALT  Date Value Ref Range Status  01/26/2024 19 0 - 44 U/L Final         Passed - Valid encounter within last 12 months    Recent Outpatient Visits           2 weeks ago Pure hypercholesterolemia   [place] [facility] [place], Megan P, DO   7 months ago Encounter for [employer] annual wellness exam   [place] [facility] [place], Megan P, DO   9 months ago Preop exam for internal medicine   [place] [facility]  [place], Megan P, DO

## 2024-02-03 NOTE — Telephone Encounter (Signed)
 Pharmacy is calling to report to [facility] that Rx #: [id number] apixaban  (ELIQUIS ) 5 MG TABS tablet [[id number]] was last filled 01/01/2024 for 90 day

## 2024-02-06 ENCOUNTER — Other Ambulatory Visit: Payer: Self-pay

## 2024-02-06 ENCOUNTER — Other Ambulatory Visit: Payer: Self-pay | Admitting: Family Medicine

## 2024-02-06 NOTE — Telephone Encounter (Unsigned)
 Copied from CRM [id number]. Topic: Clinical - Prescription Issue >> Feb 06, 2024  3:13 PM Donee H wrote: Reason for CRM: Mandy from [facility] pharmacy calling regarding denial of medication refill request for medication apixaban  (ELIQUIS ) 5 MG TABS tablet and  spironolactone  (ALDACTONE ) 25 MG tablet. She states medication as not be filled since June and patient stated never received medications. She would like for Dr Vicci or nurse to follow up with her . She states she is the one that prepares all of his medications.  Callback number [phone]

## 2024-02-08 ENCOUNTER — Other Ambulatory Visit (HOSPITAL_COMMUNITY): Payer: Self-pay

## 2024-02-08 ENCOUNTER — Other Ambulatory Visit: Payer: Self-pay

## 2024-02-09 MED ORDER — APIXABAN 5 MG PO TABS: 5.0000 mg | ORAL_TABLET | Freq: Two times a day (BID) | ORAL | 1 refills | Status: DC

## 2024-02-09 MED ORDER — SPIRONOLACTONE 25 MG PO TABS: 25.0000 mg | ORAL_TABLET | Freq: Every day | ORAL | 1 refills | Status: DC

## 2024-02-13 ENCOUNTER — Other Ambulatory Visit
Admission: RE | Admit: 2024-02-13 | Discharge: 2024-02-13 | Disposition: A | Discharge status: Home / Self Care | Source: Ambulatory Visit | Attending: Cardiology | Admitting: Cardiology

## 2024-02-13 DIAGNOSIS — I5022 Chronic systolic (congestive) heart failure: Secondary | ICD-10-CM | POA: Insufficient documentation

## 2024-02-13 LAB — BASIC METABOLIC PANEL WITH GFR
Anion gap: 9 (ref 5–15)
BUN: 17 mg/dL (ref 8–23)
CO2: 21 mmol/L — ABNORMAL LOW (ref 22–32)
Calcium: 9 mg/dL (ref 8.9–10.3)
Chloride: 106 mmol/L (ref 98–111)
Creatinine, Ser: 1.28 mg/dL — ABNORMAL HIGH (ref 0.61–1.24)
GFR, Estimated: >60 mL/min (ref >60)
Glucose, Bld: 102 mg/dL — ABNORMAL HIGH (ref 70–99)
Potassium: 4 mmol/L (ref 3.5–5.1)
Sodium: 136 mmol/L (ref 135–145)

## 2024-02-14 ENCOUNTER — Ambulatory Visit (HOSPITAL_COMMUNITY): Payer: Self-pay | Admitting: Cardiology

## 2024-02-14 ENCOUNTER — Ambulatory Visit: Admitting: Cardiology

## 2024-02-15 ENCOUNTER — Telehealth: Payer: Self-pay | Admitting: Family

## 2024-02-15 NOTE — Telephone Encounter (Signed)
 Called to confirm/remind patient of [facility].   Appointment:   [x] Confirmed  [] Left mess   [] No answer/No voice mail  [] VM Full/unable to leave message  [] Phone not in service  Patient reminded to bring all medications and/or complete list.  Confirmed patient has transportation. Gave directions, instructed to utilize valet parking.

## 2024-02-15 NOTE — Progress Notes (Signed)
 [facility] Note    PCP: Vicci Duwaine SQUIBB, DO HF Cardiology: Dr. Rolan  Chief complaint: fatigue   HPI: Tyler Cohen is a 68 y.o. male with a history of atrial fibrillation, hyperthyroidism, nonischemic cardiomyopathy, and mitral regurgitation presents for followup of CHF.  Echo in 2/23 showed EF 25-30%.  Patient was admitted in 9/25 with atrial flutter with RVR, volume overload, cardiogenic shock.  Echo showed EF 20-25%, normal RV function, severe LAE, severe Tyler, mild-moderate Tyler. He was started on milrinone  and norepinephrine  for cardiogenic shock.  RHC/LHC showed no obstructive CAD, volume overload, low output HF.  He was diuresed and transitioned off milrinone . He was cardioverted back to NSR. TEE showed severe functional Tyler, seen by structural heart team with suggestion being initial medical management with consideration for mTEER if Tyler remained severe despite medical management.   He presents today for a HF follow-up visit with a chief complaint of fatigue. Denies any shortness of breath, chest pain, palpitations, dizziness, edema. Not adding salt to his foods. Likes to bike for exercise. Has gotten his flu vaccine for this season. Overall, he says that he feels great. He declined going to cardiac rehab as he says that he's quite active at home.   Labs (10/25): LDL 117, TSH 1.77, T4 normal, K 4.4, creatinine 1.54 Labs (10/25): dig level 1.1 Labs (11/25): K 4.0, creatinine 1.28  ECG not done  PMH: 1. Prostate cancer s/p radiation therapy.  2. Hyperthyroidism: s/p RAI ablation.  3. Chronic systolic CHF: Nonischemic cardiomyopathy.  - Echo (2/23): EF 25-30%.  - Echo (9/25): EF 20-25%, normal RV function, severe LAE, severe Tyler, mild-moderate TR. - LHC/RHC (9/25): Nonobstructive CAD; mean RA 12, PA 64/40 mean 37, PCWP 34, CI 1.6 (Fick), CI 2.4 (thermo).  - TEE (9/25): EF 20-25%, normal RV size/mild systolic dysfunction, small PFO, severe functional Tyler with PISA ERO  0.48 cm^2, restricted posterior leaflet.  4. Mitral regurgitation: Functional Tyler.  Severe on 9/25 TEE.  5. Atrial fibrillation: Paroxysmal.  6. Hyperlipidemia  Social History   Socioeconomic History   Marital status: Married    Spouse name: Not on file   Number of children: Not on file   Years of education: Not on file   Highest education level: High school graduate  Occupational History   Not on file  Tobacco Use   Smoking status: Former    Current packs/day: 0.00    Average packs/day: 0.3 packs/day for 42.0 years (10.5 ttl pk-yrs)    Types: Cigars, Cigarettes    Start date: 05/18/1979    Quit date: 05/17/2021    Years since quitting: 2.7   Smokeless tobacco: Never  Vaping Use   Vaping status: Never Used  Substance and Sexual Activity   Alcohol use: No   Drug use: Yes    Types: Marijuana    Comment: pt states he smokes every once in a while   Sexual activity: Yes    Birth control/protection: None  Other Topics Concern   Not on file  Social History Narrative   Works part time.   Social Drivers of Corporate Investment Banker Strain: Low Risk  (05/23/2023)   Received from [facility]   Overall Financial Resource Strain (CARDIA)    Difficulty of Paying Living Expenses: Not hard at all  Food Insecurity: No Food Insecurity (12/26/2023)   Hunger Vital Sign    Worried About Running Out of Food in the Last Year: Never true  Ran Out of Food in the Last Year: Never true  Transportation Needs: No Transportation Needs (12/26/2023)   PRAPARE - Administrator, Civil Service (Medical): No    Lack of Transportation (Non-Medical): No  Physical Activity: Insufficiently Active (01/12/2022)   Exercise Vital Sign    Days of Exercise per Week: 3 days    Minutes of Exercise per Session: 30 min  Stress: Stress Concern Present (01/12/2022)   [employer] of Occupational Health - Occupational Stress Questionnaire    Feeling of Stress : To some extent   Social Connections: Moderately Integrated (12/26/2023)   Social Connection and Isolation Panel    Frequency of Communication with Friends and Family: Twice a week    Frequency of Social Gatherings with Friends and Family: Once a week    Attends Religious Services: More than 4 times per year    Active Member of [employer] or Organizations: Yes    Attends Engineer, Structural: More than 4 times per year    Marital Status: Never married  Recent Concern: Social Connections - Moderately Isolated (12/24/2023)   Social Connection and Isolation Panel    Frequency of Communication with Friends and Family: Twice a week    Frequency of Social Gatherings with Friends and Family: Never    Attends Religious Services: More than 4 times per year    Active Member of [employer] or Organizations: Yes    Attends Engineer, Structural: More than 4 times per year    Marital Status: Never married  Intimate Partner Violence: Not At Risk (12/26/2023)   Humiliation, Afraid, Rape, and Kick questionnaire    Fear of Current or Ex-Partner: No    Emotionally Abused: No    Physically Abused: No    Sexually Abused: No   Family History  Problem Relation Age of Onset   Hypertension Mother    Diabetes Mother        lost both legs   Kidney disease Mother    Hypertension Father    Emphysema Father    Sickle cell trait Father    Hypertension Brother    Hyperlipidemia Brother    Sickle cell trait Sister    ROS: All systems reviewed and negative except as per HPI.   Current Outpatient Medications  Medication Sig Dispense Refill   albuterol  (VENTOLIN  HFA) 108 (90 Base) MCG/ACT inhaler INHALE 2 PUFFS INTO THE LUNGS EVERY 6 HOURS AS NEEDED FOR WHEEZING OR SHORTNESS OF BREATH 8.5 g 3   amiodarone  (PACERONE ) 200 MG tablet Take 2 tablets (400 mg total) by mouth 2 (two) times daily for 7 days, THEN 1 tablet (200 mg total) daily. 118 tablet 0   apixaban  (ELIQUIS ) 5 MG TABS tablet Take 1 tablet (5 mg total) by mouth 2  (two) times daily. 180 tablet 1   atorvastatin  (LIPITOR) 20 MG tablet Take 1 tablet (20 mg total) by mouth daily. 90 tablet 3   dapagliflozin  propanediol (FARXIGA ) 10 MG TABS tablet Take 1 tablet (10 mg total) by mouth daily. 90 tablet 3   digoxin  (LANOXIN ) 0.125 MG tablet Take 1 tablet (125 mcg total) by mouth daily. 90 tablet 3   ENTRESTO  24-26 MG Take 1 tablet by mouth 2 (two) times daily. 60 tablet 3   methimazole  (TAPAZOLE ) 10 MG tablet Take 1.5 tablets (15 mg total) by mouth daily. 45 tablet 6   metoprolol  succinate (TOPROL -XL) 25 MG 24 hr tablet Take 0.5 tablets (12.5 mg total) by mouth daily. 45 tablet  3   Multiple Vitamins-Minerals (MENS MULTIVITAMIN PO) Take 1 tablet by mouth daily.     spironolactone  (ALDACTONE ) 25 MG tablet Take 1 tablet (25 mg total) by mouth daily. 90 tablet 1   No current facility-administered medications for this visit.   Vitals:   02/16/24 1038  BP: 105/79  Pulse: 72  SpO2: 98%  Weight: 163 lb 6.4 oz (74.1 kg)   Wt Readings from Last 3 Encounters:  02/16/24 163 lb 6.4 oz (74.1 kg)  01/26/24 161 lb (73 kg)  01/18/24 160 lb (72.6 kg)   Lab Results  Component Value Date   CREATININE 1.28 (H) 02/13/2024   CREATININE 1.70 (H) 01/26/2024   CREATININE 1.54 (H) 01/18/2024    Physical Exam:   General: Well appearing.  Cor: No JVD. Regular rhythm, rate.  Lungs: clear Abdomen: soft, nontender, nondistended. Extremities: no edema Neuro:. Affect pleasant  Assessment/Plan: 1. Chronic systolic CHF: Nonischemic cardiomyopathy.  Admitted in 9/25 with cardiogenic shock in setting of AF/RVR. Echo in 9/25 showed EF 20-25%, normal RV function, severe LAE, severe Tyler, mild-moderate TR. RHC/LHC in 9/25 showed mild nonobstructive CAD, low cardiac output, high filling pressures. Patient was diuresed and weaned off milrinone , then cardioverted to NSR.  NYHA class II symptoms currently. Euvolemic. Plan to titrate meds then repeat echo to decide on ICD as well as need  for mTEER.  - Continue Farxiga  10 daily.  - Continue digoxin  0.125, repeat level today as he hasn't taken meds this morning yet.  - Continue Toprol  XL 12.5 mg daily.  - Continue Entresto  24/26 bid.   - Continue spironolactone  25 daily - Current BP (105/79) will not allow for further titration of meds - Will get updated echo to decide on ICD and mTEER.  Narrow QRS so not CRT candidate.  - Was previously referred to cardiac rehab but he declined as he feels like he's quite active at home 2. Mitral regurgitation: Severe functional Tyler on 9/25 TEE.  He would be a potential mTEER candidate.  Will reassess Tyler on good medical therapy before deciding on mTEER.  - Have ordered updated echo  3. Atrial fibrillation: Paroxysmal.  AF in 9/25 led to CHF decompensation, need to keep in NSR.   - Continue amiodarone  200 daily. Amiodarone  is not a good long-term treatment for this patient due to h/o hyperthyroidism.  - Has upcoming EP appointment 02/26, would like to see him off amiodarone .  - Continue apixaban  5mg  BID 4. Hyperthyroidism: S/p RAI ablation. He is on methimazole .  Recent TSH and free T4 normal.  He is on amiodarone  to try to maintain NSR, so risk for worsening of thyroid  abnormalities.  - Continue methimazole .  - Continue followup with endocrinology.    Return for already scheduled appointment with Dr Rolan in 1 month, sooner if needed.   I spent 30 minutes reviewing records, interviewing/ examing patient and managing plan/ orders.   Tyler Cohen Class 02/15/2024

## 2024-02-16 ENCOUNTER — Encounter: Payer: Self-pay | Admitting: Family

## 2024-02-16 ENCOUNTER — Ambulatory Visit: Attending: Family | Admitting: Family

## 2024-02-16 VITALS — BP 105/79 | HR 72 | Wt 163.4 lb

## 2024-02-16 DIAGNOSIS — I480 Paroxysmal atrial fibrillation: Secondary | ICD-10-CM | POA: Diagnosis not present

## 2024-02-16 DIAGNOSIS — E0590 Thyrotoxicosis, unspecified without thyrotoxic crisis or storm: Secondary | ICD-10-CM | POA: Diagnosis not present

## 2024-02-16 DIAGNOSIS — I428 Other cardiomyopathies: Secondary | ICD-10-CM | POA: Insufficient documentation

## 2024-02-16 DIAGNOSIS — Z79890 Hormone replacement therapy (postmenopausal): Secondary | ICD-10-CM | POA: Insufficient documentation

## 2024-02-16 DIAGNOSIS — Z87891 Personal history of nicotine dependence: Secondary | ICD-10-CM | POA: Diagnosis not present

## 2024-02-16 DIAGNOSIS — I2510 Atherosclerotic heart disease of native coronary artery without angina pectoris: Secondary | ICD-10-CM | POA: Insufficient documentation

## 2024-02-16 DIAGNOSIS — Z7901 Long term (current) use of anticoagulants: Secondary | ICD-10-CM | POA: Insufficient documentation

## 2024-02-16 DIAGNOSIS — I5022 Chronic systolic (congestive) heart failure: Secondary | ICD-10-CM | POA: Diagnosis present

## 2024-02-16 DIAGNOSIS — I340 Nonrheumatic mitral (valve) insufficiency: Secondary | ICD-10-CM | POA: Insufficient documentation

## 2024-02-16 DIAGNOSIS — Z79899 Other long term (current) drug therapy: Secondary | ICD-10-CM | POA: Diagnosis not present

## 2024-02-16 NOTE — Patient Instructions (Signed)
 Medication Changes:  No medication changes today!  Lab Work:   Go downstairs to [facility] on LOWER LEVEL to have your blood work completed.  We will only call you if the results are abnormal or if the provider would like to make medication changes.  No news is good news.     Testing/Procedures:  Your physician has requested that you have an echocardiogram. Echocardiography is a painless test that uses sound waves to create images of your heart. It provides your doctor with information about the size and shape of your heart and how well your heart's chambers and valves are working. This procedure takes approximately one hour. There are no restrictions for this procedure. Please do NOT wear cologne, perfume, aftershave, or lotions (deodorant is allowed). Please arrive 15 minutes prior to your appointment time.  Please note: We ask at that you not bring children with you during ultrasound (echo/ vascular) testing. Due to room size and safety concerns, children are not allowed in the ultrasound rooms during exams. Our front office staff cannot provide observation of children in our lobby area while testing is being conducted. An adult accompanying a patient to their appointment will only be allowed in the ultrasound room at the discretion of the ultrasound technician under special circumstances. We apologize for any inconvenience.  Someone will be in contact with you in order to schedule your appointment.    Follow-Up in: Please follow up with the [facility] Clinic in December with Dr. Rolan. You have this appointment already scheduled.    Thank you for choosing [facility].    At the [facility] Clinic, you and your health needs are our priority. We have a designated team specialized in the treatment of Heart Failure. This Care Team includes your primary Heart Failure Specialized Cardiologist (physician), Advanced Practice  Providers (APPs- Physician Assistants and Nurse Practitioners), and Pharmacist who all work together to provide you with the care you need, when you need it.   You may see any of the following providers on your designated Care Team at your next follow up:  Dr. Toribio Fuel Dr. Ezra Rolan Dr. Ria Commander Dr. Morene Brownie Ellouise Class, FNP Jaun Bash, RPH-CPP  Please be sure to bring in all your medications bottles to every appointment.   Need to Contact Us :  If you have any questions or concerns before your next appointment please send us  a message through [place] or call our office at [phone].    TO LEAVE A MESSAGE FOR THE NURSE SELECT OPTION 2, PLEASE LEAVE A MESSAGE INCLUDING: YOUR NAME DATE OF BIRTH CALL BACK NUMBER REASON FOR CALL**this is important as we prioritize the call backs  YOU WILL RECEIVE A CALL BACK THE SAME DAY AS LONG AS YOU CALL BEFORE 4:00 PM

## 2024-02-17 ENCOUNTER — Ambulatory Visit: Payer: Self-pay | Admitting: Family

## 2024-02-17 LAB — DIGOXIN LEVEL: Digoxin, Serum: 0.5 ng/mL (ref 0.5–0.9)

## 2024-02-28 ENCOUNTER — Ambulatory Visit: Admitting: Family Medicine

## 2024-02-28 ENCOUNTER — Encounter: Payer: Self-pay | Admitting: Family Medicine

## 2024-02-28 VITALS — BP 102/72 | HR 71 | Temp 98.0°F | Ht 73.0 in | Wt 163.4 lb

## 2024-02-28 DIAGNOSIS — I4819 Other persistent atrial fibrillation: Secondary | ICD-10-CM | POA: Diagnosis not present

## 2024-02-28 DIAGNOSIS — Z23 Encounter for immunization: Secondary | ICD-10-CM

## 2024-02-28 DIAGNOSIS — E0590 Thyrotoxicosis, unspecified without thyrotoxic crisis or storm: Secondary | ICD-10-CM | POA: Diagnosis not present

## 2024-02-28 MED ORDER — METOPROLOL SUCCINATE ER 25 MG PO TB24: 12.5000 mg | ORAL_TABLET | Freq: Every day | ORAL | 3 refills | Status: DC

## 2024-02-28 MED ORDER — AMIODARONE HCL 200 MG PO TABS: 200.0000 mg | ORAL_TABLET | Freq: Two times a day (BID) | ORAL | 2 refills | Status: DC

## 2024-02-28 MED ORDER — METHIMAZOLE 5 MG PO TABS: ORAL_TABLET | ORAL | 3 refills | Status: DC

## 2024-02-28 MED ORDER — AMIODARONE HCL 200 MG PO TABS: 200.0000 mg | ORAL_TABLET | Freq: Every day | ORAL | 0 refills | Status: DC

## 2024-02-28 MED ORDER — METHIMAZOLE 10 MG PO TABS: ORAL_TABLET | ORAL | 3 refills | Status: DC

## 2024-02-28 MED ORDER — DIGOXIN 125 MCG PO TABS: 125.0000 ug | ORAL_TABLET | Freq: Every day | ORAL | 3 refills | Status: DC

## 2024-02-28 MED ORDER — DAPAGLIFLOZIN PROPANEDIOL 10 MG PO TABS: 10.0000 mg | ORAL_TABLET | Freq: Every day | ORAL | 1 refills | Status: DC

## 2024-02-28 MED ORDER — SPIRONOLACTONE 25 MG PO TABS: 12.5000 mg | ORAL_TABLET | Freq: Every day | ORAL | 3 refills | Status: DC

## 2024-02-28 MED ORDER — ATORVASTATIN CALCIUM 20 MG PO TABS: 20.0000 mg | ORAL_TABLET | Freq: Every day | ORAL | 1 refills | Status: DC

## 2024-02-28 NOTE — Progress Notes (Signed)
 BP 102/72   Pulse 71   Temp 98 F (36.7 C) (Oral)   Ht 6' 1 (1.854 m)   Wt 163 lb 6.4 oz (74.1 kg)   SpO2 97%   BMI 21.56 kg/m    Subjective:    Patient ID: Tyler ONEIDA Jearlean Mickey., male    DOB: 1956-02-21, 68 y.o.   MRN: [id number]  HPI: Tyler Joslyn. is a 68 y.o. male  Chief Complaint  Patient presents with   Congestive Heart Failure   Has been following closely with cardiology who has adjusted his medicine. They don't want to keep him on amiodarone  due to his hyperthyroidism. They have referred him to EP for ? Ablation to try keep him off amiodarone . He has been following closely with them. He comes in today feeling well. He notes that he has no complaints. Is not feeling particularly tired. He brings in his medications and has not been taking most of his medicine that he was discharged from the hospital on. He has not been taking his amiodarone , he's been taking jardiance  rather than farxiga , he has not been taking his metoprolol  or his spironalactone. He has been taking his entresto , his digoxin  his methimazole  and his eliquis .   Relevant past medical, surgical, family and social history reviewed and updated as indicated. Interim medical history since our last visit reviewed. Allergies and medications reviewed and updated.  Review of Systems  Constitutional: Negative.   Respiratory: Negative.    Cardiovascular: Negative.   Gastrointestinal: Negative.   Musculoskeletal: Negative.   Neurological: Negative.   Psychiatric/Behavioral: Negative.      Per HPI unless specifically indicated above     Objective:    BP 102/72   Pulse 71   Temp 98 F (36.7 C) (Oral)   Ht 6' 1 (1.854 m)   Wt 163 lb 6.4 oz (74.1 kg)   SpO2 97%   BMI 21.56 kg/m   Wt Readings from Last 3 Encounters:  02/28/24 163 lb 6.4 oz (74.1 kg)  02/16/24 163 lb 6.4 oz (74.1 kg)  01/26/24 161 lb (73 kg)    Physical Exam Vitals and nursing note reviewed.  Constitutional:      General: He is not  in acute distress.    Appearance: Normal appearance. He is not ill-appearing, toxic-appearing or diaphoretic.  HENT:     Head: Normocephalic and atraumatic.     Right Ear: External ear normal.     Left Ear: External ear normal.     Nose: Nose normal.     Mouth/Throat:     Mouth: Mucous membranes are moist.     Pharynx: Oropharynx is clear.  Eyes:     General: No scleral icterus.       Right eye: No discharge.        Left eye: No discharge.     Extraocular Movements: Extraocular movements intact.     Conjunctiva/sclera: Conjunctivae normal.     Pupils: Pupils are equal, round, and reactive to light.  Cardiovascular:     Rate and Rhythm: Normal rate and regular rhythm.     Pulses: Normal pulses.     Heart sounds: Normal heart sounds. No murmur heard.    No friction rub. No gallop.  Pulmonary:     Effort: Pulmonary effort is normal. No respiratory distress.     Breath sounds: Normal breath sounds. No stridor. No wheezing, rhonchi or rales.  Chest:     Chest wall: No tenderness.  Musculoskeletal:  General: Normal range of motion.     Cervical back: Normal range of motion and neck supple.  Skin:    General: Skin is warm and dry.     Capillary Refill: Capillary refill takes less than 2 seconds.     Coloration: Skin is not jaundiced or pale.     Findings: No bruising, erythema, lesion or rash.  Neurological:     General: No focal deficit present.     Mental Status: He is alert and oriented to person, place, and time. Mental status is at baseline.  Psychiatric:        Mood and Affect: Mood normal.        Behavior: Behavior normal.        Thought Content: Thought content normal.        Judgment: Judgment normal.     Results for orders placed or performed in visit on 02/16/24  Digoxin  level   Collection Time: 02/16/24 11:29 AM  Result Value Ref Range   Digoxin , Serum 0.5 0.5 - 0.9 ng/mL      Assessment & Plan:   Problem List Items Addressed This Visit        Cardiovascular and Mediastinum   Afib (HCC) - Primary   Has not been on his amniodarone. Reached back out to his cardiologist. Will restart at 200mg  BID. Stressed the importance to patient of taking medicine 2x a day rather than 1x a day. Will get all his meds pill packed through [facility]. He is not due for a pill pack until 12/8. Will get short course not pill packed to make sure he gets back on it sooner. Due to follow up with cardiology on 12/8- which is also when his pill pack will be available. Call with any concerns.       Relevant Medications   atorvastatin  (LIPITOR) 20 MG tablet   digoxin  (LANOXIN ) 0.125 MG tablet   metoprolol  succinate (TOPROL -XL) 25 MG 24 hr tablet   spironolactone  (ALDACTONE ) 25 MG tablet   amiodarone  (PACERONE ) 200 MG tablet   amiodarone  (PACERONE ) 200 MG tablet     Endocrine   Hyperthyroidism   Last thyroid  labs were normal. Unclear if he was on his amiodarone . Restarting amiodarone . Will repeat thyroid  labs next visit if not done by cardiology.      Relevant Medications   methimazole  (TAPAZOLE ) 10 MG tablet   methimazole  (TAPAZOLE ) 5 MG tablet   metoprolol  succinate (TOPROL -XL) 25 MG 24 hr tablet   Other Visit Diagnoses       Need for COVID-19 vaccine       Relevant Orders   Pfizer Comirnaty Covid -19 Vaccine 75yrs and older (Completed)        Follow up plan: Return in about 6 weeks (around 04/10/2024).    I personally spent a total of 45 minutes in the care of the patient today including preparing to see the patient, getting/reviewing separately obtained history, performing a medically appropriate exam/evaluation, counseling and educating, placing orders, referring and communicating with other health care professionals, documenting clinical information in the EHR, coordinating care, and calling pharmacy and secure messaging cardiology.

## 2024-02-28 NOTE — Assessment & Plan Note (Signed)
 Last thyroid  labs were normal. Unclear if he was on his amiodarone . Restarting amiodarone . Will repeat thyroid  labs next visit if not done by cardiology.

## 2024-02-28 NOTE — Assessment & Plan Note (Signed)
 Has not been on his amniodarone. Reached back out to his cardiologist. Will restart at 200mg  BID. Stressed the importance to patient of taking medicine 2x a day rather than 1x a day. Will get all his meds pill packed through [facility]. He is not due for a pill pack until 12/8. Will get short course not pill packed to make sure he gets back on it sooner. Due to follow up with cardiology on 12/8- which is also when his pill pack will be available. Call with any concerns.

## 2024-02-29 ENCOUNTER — Telehealth: Payer: Self-pay

## 2024-02-29 MED ORDER — AMIODARONE HCL 200 MG PO TABS: 200.0000 mg | ORAL_TABLET | Freq: Every day | ORAL | 2 refills | Status: DC

## 2024-02-29 MED ORDER — AMIODARONE HCL 200 MG PO TABS: 200.0000 mg | ORAL_TABLET | Freq: Every day | ORAL | 0 refills | Status: DC

## 2024-02-29 NOTE — Telephone Encounter (Signed)
 New Rx sent to his pharmacy

## 2024-02-29 NOTE — Telephone Encounter (Signed)
 Both Amiodarone  prescriptions sent in yesterday. The short course of medication was sent in to take once daily but the pill pack prescription is twice per day. Pharmacy wants to clarify if the short, not pill packed prescription is supposed to be pill packed as well.

## 2024-02-29 NOTE — Telephone Encounter (Signed)
 Copied from CRM #[id number]. Topic: Clinical - Prescription Issue >> Feb 28, 2024  4:43 PM Jasmin G wrote: Reason for CRM: Staff from [facility] - [place], [place] - [address] requested a call back at [phone] to discuss amiodarone  (PACERONE ) 200 MG tablet as there's 2 different dosage directions.

## 2024-03-09 ENCOUNTER — Telehealth: Payer: Self-pay | Admitting: Cardiology

## 2024-03-09 NOTE — Telephone Encounter (Signed)
 Called to confirm/remind patient of their appointment at the [facility] on 03/12/24.   Appointment:   [x] Confirmed  [] Left mess   [] No answer/No voice mail  [] VM Full/unable to leave message  [] Phone not in service  Patient reminded to bring all medications and/or complete list.  Confirmed patient has transportation. Gave directions, instructed to utilize valet parking.

## 2024-03-12 ENCOUNTER — Encounter: Payer: Self-pay | Admitting: Cardiology

## 2024-03-12 ENCOUNTER — Other Ambulatory Visit: Payer: Self-pay

## 2024-03-12 ENCOUNTER — Ambulatory Visit: Attending: Cardiology | Admitting: Cardiology

## 2024-03-12 VITALS — BP 109/76 | HR 72 | Wt 172.0 lb

## 2024-03-12 DIAGNOSIS — I5022 Chronic systolic (congestive) heart failure: Secondary | ICD-10-CM

## 2024-03-12 DIAGNOSIS — I4819 Other persistent atrial fibrillation: Secondary | ICD-10-CM

## 2024-03-12 MED ORDER — METOPROLOL SUCCINATE ER 25 MG PO TB24: 25.0000 mg | ORAL_TABLET | Freq: Every day | ORAL | 6 refills | Status: DC

## 2024-03-12 MED ORDER — AMIODARONE HCL 200 MG PO TABS: 200.0000 mg | ORAL_TABLET | Freq: Every day | ORAL | 2 refills | Status: DC

## 2024-03-12 MED ORDER — SPIRONOLACTONE 25 MG PO TABS: 25.0000 mg | ORAL_TABLET | Freq: Every day | ORAL | 6 refills | Status: DC

## 2024-03-12 NOTE — Patient Instructions (Addendum)
 Medication Changes:  INCREASE Metoprolol  XL to 25 mg (1 tab) Daily  INCREASE Spironolactone  to 25 mg (1 tab) Daily  Lab Work:  Labs are needed today, **Please go downstairs to [facility] on LOWER LEVEL to have your blood work completed.  Your results will be available in MyChart. We will contact you for abnormal readings.   Your provider would like for you to return in 1-2 weeks to have repeat lab work.  Please go to [facility] [address] ([facility] [address], [place] [address] You do not need an appointment.  They are open from 8 am- 4:30 pm.  Lunch from 1:00 pm- 2:00 pm   Testing/Procedures:  Your physician has requested that you have an echocardiogram. Echocardiography is a painless test that uses sound waves to create images of your heart. It provides your doctor with information about the size and shape of your heart and how well your heart's chambers and valves are working. This procedure takes approximately one hour. There are no restrictions for this procedure. Please do NOT wear cologne, perfume, aftershave, or lotions (deodorant is allowed). Please arrive 15 minutes prior to your appointment time.  **THIS IS CURRENTLY SCHEDULED FOR 03/23/24, YOU WILL BE CALLED TO RESCHEDULE IT FOR FEBRUARY**  Please note: We ask at that you not bring children with you during ultrasound (echo/ vascular) testing. Due to room size and safety concerns, children are not allowed in the ultrasound rooms during exams. Our front office staff cannot provide observation of children in our lobby area while testing is being conducted. An adult accompanying a patient to their appointment will only be allowed in the ultrasound room at the discretion of the ultrasound technician under special circumstances. We apologize for any inconvenience.  Referrals:  You have been referred to EP to discuss getting an afib ablation, this appointment is scheduled for Tuesday Feb  10th   Special Instructions // Education:  Do the following things EVERYDAY: Weigh yourself in the morning before breakfast. Write it down and keep it in a log. Take your medicines as prescribed Eat low salt foods--Limit salt (sodium) to 2000 mg per day.  Stay as active as you can everyday Limit all fluids for the day to less than 2 liters   Follow-Up in:   Please follow up with our heart failure pharmacist in 3-4 WEEKS  Your physician recommends that you schedule a follow-up appointment in: 2-3 months (February/March 2026), **YOU WILL BE CALLED CLOSER TO THIS TIME TO SCHEDULE    If you have any questions or concerns before your next appointment please send us  a message through [place] or call our office at [phone], If it is after office hours your call will be answered by our answering service and directed appropriately.     At the [facility], you and your health needs are our priority. We have a designated team specialized in the treatment of Heart Failure. This Care Team includes your primary Heart Failure Specialized Cardiologist (physician), Advanced Practice Providers (APPs- Physician Assistants and Nurse Practitioners), and Pharmacist who all work together to provide you with the care you need, when you need it.   You may see any of the following providers on your designated Care Team at your next follow up:  Dr. Toribio Fuel Dr. [name] Dr. Ria Commander Dr. Odis Brownie Greig Mosses, NP [place], [place] [address] [place], [place] Beckey Coe, NP Jordan Lee, NP Ellouise Class, NP Jaun Bash, PharmD

## 2024-03-12 NOTE — Progress Notes (Signed)
 PCP: Vicci Duwaine SQUIBB, DO HF Cardiology: Dr. Rolan  Chief complaint: CHF  68 y.o. with history of atrial fibrillation, hyperthyroidism, nonischemic cardiomyopathy, and mitral regurgitation presents for followup of CHF.  Echo in 2/23 showed EF 25-30%.  Patient was admitted in 9/25 with atrial flutter with RVR, volume overload, cardiogenic shock.  Echo showed EF 20-25%, normal RV function, severe LAE, severe MR, mild-moderate MR. He was started on milrinone  and norepinephrine  for cardiogenic shock.  RHC/LHC showed no obstructive CAD, volume overload, low output HF.  He was diuresed and transitioned off milrinone . He was cardioverted back to NSR. TEE showed severe functional MR, seen by structural heart team with suggestion being initial medical management with consideration for mTEER if MR remained severe despite medical management.   He is in NSR today, denies palpitations.  Says he is taking all his medications.  Weight is up about 11 lbs, he says that he's been eating more/has a better appetite. No dyspnea walking up stairs or on flat ground.  No chest pain.  No lightheadedness.  Generally feels like he is doing well.   Labs (10/25): LDL 117, TSH 1.77, T4 normal, K 4.4, creatinine 1.54, LFTs normal Labs (11/25): digoxin  level 0.5, K 4, creatinine 1.28  ECG (personally reviewed): NSR, LVH   PMH: 1. Prostate cancer s/p radiation therapy.  2. Hyperthyroidism: s/p RAI ablation.  3. Chronic systolic CHF: Nonischemic cardiomyopathy.  - Echo (2/23): EF 25-30%.  - Echo (9/25): EF 20-25%, normal RV function, severe LAE, severe MR, mild-moderate TR. - LHC/RHC (9/25): Nonobstructive CAD; mean RA 12, PA 64/40 mean 37, PCWP 34, CI 1.6 (Fick), CI 2.4 (thermo).  - TEE (9/25): EF 20-25%, normal RV size/mild systolic dysfunction, small PFO, severe functional MR with PISA ERO 0.48 cm^2, restricted posterior leaflet.  4. Mitral regurgitation: Functional MR.  Severe on 9/25 TEE.  5. Atrial fibrillation:  Paroxysmal.  6. Hyperlipidemia  Social History   Socioeconomic History   Marital status: Married    Spouse name: Not on file   Number of children: Not on file   Years of education: Not on file   Highest education level: High school graduate  Occupational History   Not on file  Tobacco Use   Smoking status: Former    Current packs/day: 0.00    Average packs/day: 0.3 packs/day for 42.0 years (10.5 ttl pk-yrs)    Types: Cigars, Cigarettes    Start date: 05/18/1979    Quit date: 05/17/2021    Years since quitting: 2.8   Smokeless tobacco: Never  Vaping Use   Vaping status: Never Used  Substance and Sexual Activity   Alcohol use: No   Drug use: Yes    Types: Marijuana    Comment: pt states he smokes every once in a while   Sexual activity: Yes    Birth control/protection: None  Other Topics Concern   Not on file  Social History Narrative   Works part time.   Social Drivers of Corporate Investment Banker Strain: Low Risk  (05/23/2023)   Received from [facility]   Overall Financial Resource Strain (CARDIA)    Difficulty of Paying Living Expenses: Not hard at all  Food Insecurity: No Food Insecurity (12/26/2023)   Hunger Vital Sign    Worried About Running Out of Food in the Last Year: Never true    Ran Out of Food in the Last Year: Never true  Transportation Needs: No Transportation Needs (12/26/2023)   PRAPARE - Transportation  Lack of Transportation (Medical): No    Lack of Transportation (Non-Medical): No  Physical Activity: Insufficiently Active (01/12/2022)   Exercise Vital Sign    Days of Exercise per Week: 3 days    Minutes of Exercise per Session: 30 min  Stress: Stress Concern Present (01/12/2022)   [employer] of Occupational Health - Occupational Stress Questionnaire    Feeling of Stress : To some extent  Social Connections: Moderately Integrated (12/26/2023)   Social Connection and Isolation Panel    Frequency of Communication with  Friends and Family: Twice a week    Frequency of Social Gatherings with Friends and Family: Once a week    Attends Religious Services: More than 4 times per year    Active Member of [employer] or Organizations: Yes    Attends Engineer, Structural: More than 4 times per year    Marital Status: Never married  Recent Concern: Social Connections - Moderately Isolated (12/24/2023)   Social Connection and Isolation Panel    Frequency of Communication with Friends and Family: Twice a week    Frequency of Social Gatherings with Friends and Family: Never    Attends Religious Services: More than 4 times per year    Active Member of [employer] or Organizations: Yes    Attends Engineer, Structural: More than 4 times per year    Marital Status: Never married  Intimate Partner Violence: Not At Risk (12/26/2023)   Humiliation, Afraid, Rape, and Kick questionnaire    Fear of Current or Ex-Partner: No    Emotionally Abused: No    Physically Abused: No    Sexually Abused: No   Family History  Problem Relation Age of Onset   Hypertension Mother    Diabetes Mother        lost both legs   Kidney disease Mother    Hypertension Father    Emphysema Father    Sickle cell trait Father    Hypertension Brother    Hyperlipidemia Brother    Sickle cell trait Sister    ROS: All systems reviewed and negative except as per HPI.   Current Outpatient Medications  Medication Sig Dispense Refill   albuterol  (VENTOLIN  HFA) 108 (90 Base) MCG/ACT inhaler INHALE 2 PUFFS INTO THE LUNGS EVERY 6 HOURS AS NEEDED FOR WHEEZING OR SHORTNESS OF BREATH 8.5 g 3   amiodarone  (PACERONE ) 200 MG tablet Take 1 tablet (200 mg total) by mouth daily. 90 tablet 2   apixaban  (ELIQUIS ) 5 MG TABS tablet Take 1 tablet (5 mg total) by mouth 2 (two) times daily. 180 tablet 1   atorvastatin  (LIPITOR) 20 MG tablet Take 1 tablet (20 mg total) by mouth daily. 90 tablet 1   dapagliflozin  propanediol (FARXIGA ) 10 MG TABS tablet Take 1  tablet (10 mg total) by mouth daily. 90 tablet 1   digoxin  (LANOXIN ) 0.125 MG tablet Take 1 tablet (125 mcg total) by mouth daily. 30 tablet 3   ENTRESTO  24-26 MG Take 1 tablet by mouth 2 (two) times daily. 60 tablet 3   methimazole  (TAPAZOLE ) 10 MG tablet Take with the 5mg  for 15mg  total 30 tablet 3   methimazole  (TAPAZOLE ) 5 MG tablet Take with the 10mg  for 15 mg total 30 tablet 3   Multiple Vitamins-Minerals (MENS MULTIVITAMIN PO) Take 1 tablet by mouth daily.     metoprolol  succinate (TOPROL -XL) 25 MG 24 hr tablet Take 1 tablet (25 mg total) by mouth daily. 30 tablet 6   spironolactone  (ALDACTONE ) 25 MG  tablet Take 1 tablet (25 mg total) by mouth daily. 30 tablet 6   No current facility-administered medications for this visit.   BP 109/76   Pulse 72   Wt 172 lb (78 kg)   SpO2 100%   BMI 22.69 kg/m  General: NAD Neck: No JVD, no thyromegaly or thyroid  nodule.  Lungs: Clear to auscultation bilaterally with normal respiratory effort. CV: Nondisplaced PMI.  Heart regular S1/S2, no S3/S4, no murmur.  No peripheral edema.  No carotid bruit.  Normal pedal pulses.  Abdomen: Soft, nontender, no hepatosplenomegaly, no distention.  Skin: Intact without lesions or rashes.  Neurologic: Alert and oriented x 3.  Psych: Normal affect. Extremities: No clubbing or cyanosis.  HEENT: Normal.   Assessment/Plan: 1. Chronic systolic CHF: Nonischemic cardiomyopathy.  Admitted in 9/25 with cardiogenic shock in setting of AF/RVR. Echo in 9/25 showed EF 20-25%, normal RV function, severe LAE, severe MR, mild-moderate TR. RHC/LHC in 9/25 showed mild nonobstructive CAD, low cardiac output, high filling pressures. Patient was diuresed and weaned off milrinone , then cardioverted to NSR.  NYHA class I-II symptoms currently, not volume overloaded on exam. Plan to titrate meds then repeat echo to decide on ICD as well as need for mTEER.  - Increase Toprol  XL to 25 mg daily.  - Continue Entresto  24/26 bid.   -  Continue digoxin  0.125, check level today.  - Increase spironolactone  to 25 mg daily (I thought he was taking this dose, but apparently he has been on 12.5 mg daily).  BMET/BNP today, BMET in 10 days.  - Continue Farxiga  10 daily.  - Repeat echo in 2 months after further medication titration to decide on ICD and mTEER.  Narrow QRS so not CRT candidate.  2. Mitral regurgitation: Severe functional MR on 9/25 TEE.  He would be a potential mTEER candidate.  Will reassess MR on good medical therapy before deciding on mTEER. He does not have a significant murmur on exam today.  - Repeat echo in 2 months.  3. Atrial fibrillation: Paroxysmal.  AF in 9/25 led to CHF decompensation, need to keep in NSR. He is currently in NSR on amiodarone .  - Continue amiodarone  200 daily.  Check LFTs and TSH today.  Amiodarone  is not a good long-term treatment for this patient due to h/o hyperthyroidism.  - He has an appointment with EP to discuss ablation, would like to see him off amiodarone .  4. Hyperthyroidism: S/p RAI ablation. He is on methimazole .  Recent TSH mildly low but free T4 normal.  He is on amiodarone  to try to maintain NSR, so risk for worsening of thyroid  abnormalities.  Important to stay in NSR, as above will get him in with EP to consider ablation so we can get him off amiodarone .  - Check TSH today.  - Continue methimazole .  - Continue followup with endocrinology.   Followup with HF pharmacist in 3-4 weeks for medication titration, see me in 2 months after repeat echo.   I spent 31 minutes reviewing records, interviewing/examining patient, and managing orders.   [name] 03/12/2024

## 2024-03-14 LAB — COMPREHENSIVE METABOLIC PANEL WITH GFR
ALT: 13 IU/L (ref 0–44)
AST: 16 IU/L (ref 0–40)
Albumin: 4.1 g/dL (ref 3.9–4.9)
Alkaline Phosphatase: 70 IU/L (ref 47–123)
BUN/Creatinine Ratio: 14 (ref 10–24)
BUN: 16 mg/dL (ref 8–27)
Bilirubin Total: 0.3 mg/dL (ref 0.0–1.2)
CO2: 24 mmol/L (ref 20–29)
Calcium: 9.3 mg/dL (ref 8.6–10.2)
Chloride: 101 mmol/L (ref 96–106)
Creatinine, Ser: 1.15 mg/dL (ref 0.76–1.27)
Globulin, Total: 2.5 g/dL (ref 1.5–4.5)
Glucose: 84 mg/dL (ref 70–99)
Potassium: 4.5 mmol/L (ref 3.5–5.2)
Sodium: 139 mmol/L (ref 134–144)
Total Protein: 6.6 g/dL (ref 6.0–8.5)
eGFR: 69 mL/min/1.73 (ref >59)

## 2024-03-14 LAB — TSH: TSH: 5.39 u[IU]/mL — ABNORMAL HIGH (ref 0.450–4.500)

## 2024-03-14 LAB — BRAIN NATRIURETIC PEPTIDE: BNP: 94.2 pg/mL (ref 0.0–100.0)

## 2024-03-15 ENCOUNTER — Ambulatory Visit (HOSPITAL_COMMUNITY): Payer: Self-pay | Admitting: Cardiology

## 2024-03-22 ENCOUNTER — Telehealth: Payer: Self-pay | Admitting: Family Medicine

## 2024-03-22 NOTE — Telephone Encounter (Signed)
 Copied from CRM #[id number]. Topic: General - Other >> Mar 21, 2024  5:17 PM Lauren C wrote: Reason for CRM: Aria with [facility] is calling for an updated list of patients' medications. She says a request was sent 12/7, 12/10 and 12/15. Fax # confirmed correct. I provided fax # for medical records as well.

## 2024-03-23 ENCOUNTER — Ambulatory Visit

## 2024-03-23 NOTE — Telephone Encounter (Signed)
Forms placed in provider's folder 

## 2024-03-28 NOTE — Telephone Encounter (Signed)
 Note on paperwork from Dr. Vicci to contact patient and see if he is wanting to switch to [facility] from [facility].   Contacted patient and confirmed that he does want to switch to [facility]. Will fill out forms and place in providers folder for signature.

## 2024-03-28 NOTE — Telephone Encounter (Signed)
 Form faxed to [facility].

## 2024-04-02 ENCOUNTER — Other Ambulatory Visit (HOSPITAL_COMMUNITY): Payer: Self-pay | Admitting: *Deleted

## 2024-04-02 MED ORDER — ENTRESTO 24-26 MG PO TABS: 1.0000 | ORAL_TABLET | Freq: Two times a day (BID) | ORAL | 3 refills | Status: DC

## 2024-04-04 ENCOUNTER — Other Ambulatory Visit: Payer: Self-pay

## 2024-04-04 MED ORDER — AMIODARONE HCL 200 MG PO TABS: 200.0000 mg | ORAL_TABLET | Freq: Every day | ORAL | 1 refills | Status: DC

## 2024-04-04 MED ORDER — METOPROLOL SUCCINATE ER 25 MG PO TB24: 25.0000 mg | ORAL_TABLET | Freq: Every day | ORAL | 6 refills | Status: DC

## 2024-04-04 MED ORDER — ENTRESTO 24-26 MG PO TABS: 1.0000 | ORAL_TABLET | Freq: Two times a day (BID) | ORAL | 1 refills | Status: DC

## 2024-04-04 MED ORDER — ATORVASTATIN CALCIUM 20 MG PO TABS: 20.0000 mg | ORAL_TABLET | Freq: Every day | ORAL | 1 refills | Status: DC

## 2024-04-04 MED ORDER — DIGOXIN 125 MCG PO TABS: 125.0000 ug | ORAL_TABLET | Freq: Every day | ORAL | 1 refills | Status: DC

## 2024-04-04 MED ORDER — DAPAGLIFLOZIN PROPANEDIOL 10 MG PO TABS: 10.0000 mg | ORAL_TABLET | Freq: Every day | ORAL | 1 refills | Status: DC

## 2024-04-04 MED ORDER — SPIRONOLACTONE 25 MG PO TABS: 25.0000 mg | ORAL_TABLET | Freq: Every day | ORAL | 6 refills | Status: DC

## 2024-04-04 MED ORDER — APIXABAN 5 MG PO TABS: 5.0000 mg | ORAL_TABLET | Freq: Two times a day (BID) | ORAL | 1 refills | Status: DC

## 2024-04-17 ENCOUNTER — Encounter: Payer: Self-pay | Admitting: Family Medicine

## 2024-04-17 ENCOUNTER — Ambulatory Visit: Admitting: Family Medicine

## 2024-04-17 VITALS — BP 101/68 | HR 68 | Temp 97.3°F | Ht 73.0 in | Wt 172.4 lb

## 2024-04-17 DIAGNOSIS — E0590 Thyrotoxicosis, unspecified without thyrotoxic crisis or storm: Secondary | ICD-10-CM | POA: Diagnosis not present

## 2024-04-17 DIAGNOSIS — I4819 Other persistent atrial fibrillation: Secondary | ICD-10-CM

## 2024-04-17 MED ORDER — APIXABAN 5 MG PO TABS: 5.0000 mg | ORAL_TABLET | Freq: Two times a day (BID) | ORAL | 1 refills | Status: AC

## 2024-04-17 MED ORDER — SPIRONOLACTONE 25 MG PO TABS: 25.0000 mg | ORAL_TABLET | Freq: Every day | ORAL | 6 refills | Status: AC

## 2024-04-17 MED ORDER — DIGOXIN 125 MCG PO TABS: 125.0000 ug | ORAL_TABLET | Freq: Every day | ORAL | 1 refills | Status: AC

## 2024-04-17 MED ORDER — ALBUTEROL SULFATE HFA 108 (90 BASE) MCG/ACT IN AERS: 2.0000 | INHALATION_SPRAY | Freq: Four times a day (QID) | RESPIRATORY_TRACT | 3 refills | Status: AC | PRN

## 2024-04-17 MED ORDER — METOPROLOL SUCCINATE ER 25 MG PO TB24: 25.0000 mg | ORAL_TABLET | Freq: Every day | ORAL | 6 refills | Status: AC

## 2024-04-17 MED ORDER — DAPAGLIFLOZIN PROPANEDIOL 10 MG PO TABS: 10.0000 mg | ORAL_TABLET | Freq: Every day | ORAL | 1 refills | Status: AC

## 2024-04-17 MED ORDER — AMIODARONE HCL 200 MG PO TABS: 200.0000 mg | ORAL_TABLET | Freq: Every day | ORAL | 1 refills | Status: AC

## 2024-04-17 MED ORDER — ATORVASTATIN CALCIUM 20 MG PO TABS: 20.0000 mg | ORAL_TABLET | Freq: Every day | ORAL | 1 refills | Status: AC

## 2024-04-17 MED ORDER — ENTRESTO 24-26 MG PO TABS: 1.0000 | ORAL_TABLET | Freq: Two times a day (BID) | ORAL | 1 refills | Status: AC

## 2024-04-17 NOTE — Assessment & Plan Note (Signed)
 Following closely with cardiology. Doing well. Just had all his medications sent to the pharmacy about 2 weeks ago, but didn't pick them up. Would like to switch over to mail order pharmacy. We will send medicine over for him to try to get them all pill packed. He has an appointment with EP in about a month. Continue to monitor closely.

## 2024-04-17 NOTE — Progress Notes (Signed)
 "  BP 101/68   Pulse 68   Temp (!) 97.3 F (36.3 C) (Oral)   Ht 6' 1 (1.854 m)   Wt 172 lb 6.4 oz (78.2 kg)   SpO2 98%   BMI 22.75 kg/m    Subjective:    Patient ID: Tyler ONEIDA Jearlean Mickey., male    DOB: [birth year], 69 y.o.   MRN: [id number]  HPI: Tyler Derego. is a 69 y.o. male  Chief Complaint  Patient presents with   Atrial Fibrillation   ATRIAL FIBRILLATION Atrial fibrillation status: stable Satisfied with current treatment: yes  Medication side effects:  no Medication compliance: good compliance Palpitations:  no Chest pain:  no Dyspnea on exertion:  no Orthopnea:  no Syncope:  no Edema:  no Ventricular rate control: diltiazem , metoprolol  Anti-coagulation: long acting  HYPERTHYROIDISM Thyroid  control status:overtreated Satisfied with current treatment? yes Medication side effects: yes Medication compliance: excellent compliance Recent dose adjustment:no Fatigue: no Cold intolerance: no Heat intolerance: no Weight gain: no Weight loss: no Constipation: no Diarrhea/loose stools: no Palpitations: no Lower extremity edema: no Anxiety/depressed mood: no   Relevant past medical, surgical, family and social history reviewed and updated as indicated. Interim medical history since our last visit reviewed. Allergies and medications reviewed and updated.  Review of Systems  Constitutional: Negative.   Respiratory: Negative.    Cardiovascular: Negative.   Musculoskeletal: Negative.   Neurological: Negative.   Psychiatric/Behavioral: Negative.      Per HPI unless specifically indicated above     Objective:    BP 101/68   Pulse 68   Temp (!) 97.3 F (36.3 C) (Oral)   Ht 6' 1 (1.854 m)   Wt 172 lb 6.4 oz (78.2 kg)   SpO2 98%   BMI 22.75 kg/m   Wt Readings from Last 3 Encounters:  04/17/24 172 lb 6.4 oz (78.2 kg)  03/12/24 172 lb (78 kg)  02/28/24 163 lb 6.4 oz (74.1 kg)    Physical Exam Vitals and nursing note reviewed.  Constitutional:       General: He is not in acute distress.    Appearance: Normal appearance. He is not ill-appearing, toxic-appearing or diaphoretic.  HENT:     Head: Normocephalic and atraumatic.     Right Ear: External ear normal.     Left Ear: External ear normal.     Nose: Nose normal.     Mouth/Throat:     Mouth: Mucous membranes are moist.     Pharynx: Oropharynx is clear.  Eyes:     General: No scleral icterus.       Right eye: No discharge.        Left eye: No discharge.     Extraocular Movements: Extraocular movements intact.     Conjunctiva/sclera: Conjunctivae normal.     Pupils: Pupils are equal, round, and reactive to light.  Cardiovascular:     Rate and Rhythm: Normal rate and regular rhythm.     Pulses: Normal pulses.     Heart sounds: Normal heart sounds. No murmur heard.    No friction rub. No gallop.  Pulmonary:     Effort: Pulmonary effort is normal. No respiratory distress.     Breath sounds: Normal breath sounds. No stridor. No wheezing, rhonchi or rales.  Chest:     Chest wall: No tenderness.  Musculoskeletal:        General: Normal range of motion.     Cervical back: Normal range of motion and neck supple.  Skin:    General: Skin is warm and dry.     Capillary Refill: Capillary refill takes less than 2 seconds.     Coloration: Skin is not jaundiced or pale.     Findings: No bruising, erythema, lesion or rash.  Neurological:     General: No focal deficit present.     Mental Status: He is alert and oriented to person, place, and time. Mental status is at baseline.  Psychiatric:        Mood and Affect: Mood normal.        Behavior: Behavior normal.        Thought Content: Thought content normal.        Judgment: Judgment normal.     Results for orders placed or performed in visit on 03/12/24  B Nat Peptide   Collection Time: 03/12/24  3:46 PM  Result Value Ref Range   BNP 94.2 0.0 - 100.0 pg/mL  Comp Met (CMET)   Collection Time: 03/12/24  3:46 PM  Result  Value Ref Range   Glucose 84 70 - 99 mg/dL   BUN 16 8 - 27 mg/dL   Creatinine, Ser 8.84 0.76 - 1.27 mg/dL   eGFR 69 >40 fO/fpw/8.26   BUN/Creatinine Ratio 14 10 - 24   Sodium 139 134 - 144 mmol/L   Potassium 4.5 3.5 - 5.2 mmol/L   Chloride 101 96 - 106 mmol/L   CO2 24 20 - 29 mmol/L   Calcium  9.3 8.6 - 10.2 mg/dL   Total Protein 6.6 6.0 - 8.5 g/dL   Albumin 4.1 3.9 - 4.9 g/dL   Globulin, Total 2.5 1.5 - 4.5 g/dL   Bilirubin Total 0.3 0.0 - 1.2 mg/dL   Alkaline Phosphatase 70 47 - 123 IU/L   AST 16 0 - 40 IU/L   ALT 13 0 - 44 IU/L  TSH   Collection Time: 03/12/24  3:46 PM  Result Value Ref Range   TSH 5.390 (H) 0.450 - 4.500 uIU/mL      Assessment & Plan:   Problem List Items Addressed This Visit       Cardiovascular and Mediastinum   Afib (HCC)   Following closely with cardiology. Doing well. Just had all his medications sent to the pharmacy about 2 weeks ago, but didn't pick them up. Would like to switch over to mail order pharmacy. We will send medicine over for him to try to get them all pill packed. He has an appointment with EP in about a month. Continue to monitor closely.       Relevant Medications   amiodarone  (PACERONE ) 200 MG tablet   apixaban  (ELIQUIS ) 5 MG TABS tablet   atorvastatin  (LIPITOR) 20 MG tablet   digoxin  (LANOXIN ) 0.125 MG tablet   ENTRESTO  24-26 MG   metoprolol  succinate (TOPROL -XL) 25 MG 24 hr tablet   spironolactone  (ALDACTONE ) 25 MG tablet     Endocrine   Hyperthyroidism - Primary   At last check at cardiology was in the hypothyroid range. Will recheck today. May need to get back in with endocrinology sooner. Await results.       Relevant Medications   metoprolol  succinate (TOPROL -XL) 25 MG 24 hr tablet   Other Relevant Orders   Thyroid  Panel With TSH     Follow up plan: Return in about 6 weeks (around 05/29/2024).      "

## 2024-04-17 NOTE — Assessment & Plan Note (Signed)
 At last check at cardiology was in the hypothyroid range. Will recheck today. May need to get back in with endocrinology sooner. Await results.

## 2024-04-18 ENCOUNTER — Ambulatory Visit: Payer: Self-pay | Admitting: Family Medicine

## 2024-04-18 LAB — THYROID PANEL WITH TSH
Free Thyroxine Index: 2 (ref 1.2–4.9)
T3 Uptake Ratio: 26 % (ref 24–39)
T4, Total: 7.7 ug/dL (ref 4.5–12.0)
TSH: 2.45 u[IU]/mL (ref 0.450–4.500)

## 2024-04-18 MED ORDER — METHIMAZOLE 5 MG PO TABS: ORAL_TABLET | ORAL | 1 refills | Status: AC

## 2024-04-18 MED ORDER — METHIMAZOLE 10 MG PO TABS: ORAL_TABLET | ORAL | 1 refills | Status: AC

## 2024-05-07 ENCOUNTER — Ambulatory Visit

## 2024-05-14 ENCOUNTER — Ambulatory Visit

## 2024-05-15 ENCOUNTER — Ambulatory Visit: Admitting: Cardiology

## 2024-05-28 ENCOUNTER — Ambulatory Visit: Admitting: Cardiology

## 2024-06-01 ENCOUNTER — Ambulatory Visit: Admitting: Family Medicine
